# Patient Record
Sex: Female | Born: 1935 | Hispanic: No | Smoking: Former smoker
Health system: Southern US, Community
[De-identification: ages and names within clinical notes are randomized; demographics above are authoritative.]

## PROBLEM LIST (undated history)

## (undated) DIAGNOSIS — Z9889 Other specified postprocedural states: Secondary | ICD-10-CM

## (undated) DIAGNOSIS — J449 Chronic obstructive pulmonary disease, unspecified: Secondary | ICD-10-CM

## (undated) DIAGNOSIS — G709 Myoneural disorder, unspecified: Secondary | ICD-10-CM

## (undated) DIAGNOSIS — R112 Nausea with vomiting, unspecified: Secondary | ICD-10-CM

## (undated) DIAGNOSIS — M1990 Unspecified osteoarthritis, unspecified site: Secondary | ICD-10-CM

## (undated) DIAGNOSIS — R2681 Unsteadiness on feet: Secondary | ICD-10-CM

## (undated) DIAGNOSIS — J189 Pneumonia, unspecified organism: Secondary | ICD-10-CM

## (undated) DIAGNOSIS — I499 Cardiac arrhythmia, unspecified: Secondary | ICD-10-CM

## (undated) DIAGNOSIS — E162 Hypoglycemia, unspecified: Secondary | ICD-10-CM

## (undated) DIAGNOSIS — I4891 Unspecified atrial fibrillation: Principal | ICD-10-CM

## (undated) DIAGNOSIS — I509 Heart failure, unspecified: Secondary | ICD-10-CM

## (undated) DIAGNOSIS — G629 Polyneuropathy, unspecified: Secondary | ICD-10-CM

## (undated) DIAGNOSIS — I493 Ventricular premature depolarization: Secondary | ICD-10-CM

## (undated) HISTORY — PX: OTHER SURGICAL HISTORY: SHX169

## (undated) HISTORY — DX: Unspecified osteoarthritis, unspecified site: M19.90

## (undated) HISTORY — DX: Ventricular premature depolarization: I49.3

## (undated) HISTORY — PX: CHOLECYSTECTOMY: SHX55

## (undated) HISTORY — PX: APPENDECTOMY: SHX54

## (undated) HISTORY — PX: TONSILLECTOMY: SUR1361

---

## 2011-04-14 ENCOUNTER — Ambulatory Visit: Payer: Medicare Other

## 2011-04-14 ENCOUNTER — Ambulatory Visit (INDEPENDENT_AMBULATORY_CARE_PROVIDER_SITE_OTHER): Payer: Medicare Other | Admitting: Family Medicine

## 2011-04-14 VITALS — BP 147/80 | HR 41 | Temp 97.5°F | Resp 16 | Ht 62.5 in | Wt 189.0 lb

## 2011-04-14 DIAGNOSIS — R5381 Other malaise: Secondary | ICD-10-CM | POA: Diagnosis not present

## 2011-04-14 DIAGNOSIS — M542 Cervicalgia: Secondary | ICD-10-CM

## 2011-04-14 DIAGNOSIS — R5383 Other fatigue: Secondary | ICD-10-CM | POA: Diagnosis not present

## 2011-04-14 DIAGNOSIS — R42 Dizziness and giddiness: Secondary | ICD-10-CM | POA: Diagnosis not present

## 2011-04-14 DIAGNOSIS — R531 Weakness: Secondary | ICD-10-CM

## 2011-04-14 LAB — POCT CBC
Granulocyte percent: 56.7 %G (ref 37–80)
HCT, POC: 40.7 % (ref 37.7–47.9)
Hemoglobin: 13.4 g/dL (ref 12.2–16.2)
MCHC: 32.9 g/dL (ref 31.8–35.4)
MPV: 9.2 fL (ref 0–99.8)
POC Granulocyte: 5.9 (ref 2–6.9)
POC MID %: 7.4 %M (ref 0–12)

## 2011-04-14 LAB — COMPREHENSIVE METABOLIC PANEL
ALT: 18 U/L (ref 0–35)
AST: 24 U/L (ref 0–37)
CO2: 30 mEq/L (ref 19–32)
Calcium: 11.3 mg/dL — ABNORMAL HIGH (ref 8.4–10.5)
Chloride: 102 mEq/L (ref 96–112)
Creat: 1.15 mg/dL — ABNORMAL HIGH (ref 0.50–1.10)
Sodium: 142 mEq/L (ref 135–145)
Total Bilirubin: 0.4 mg/dL (ref 0.3–1.2)
Total Protein: 6.8 g/dL (ref 6.0–8.3)

## 2011-04-14 LAB — TSH: TSH: 2.309 u[IU]/mL (ref 0.350–4.500)

## 2011-04-14 LAB — GLUCOSE, POCT (MANUAL RESULT ENTRY): POC Glucose: 66

## 2011-04-14 NOTE — Progress Notes (Signed)
Patient Name: Crystal Kerr Date of Birth: 1924/06/14 Medical Record Number: [id number] Gender: female Date of Encounter: 04/14/2011  History of Present Illness:  Crystal Kerr is a 78 y.o. very pleasant female patient who presents with the following:  Here today with "dizziness," feeling lightheaded for the last month or so.  She had one episode of vertigo but mostly she is lightheaded.  Especially with poition change or bending over- she wonders if she is having hypotension.  She also notes a stiff neck especially at night- also notes that her neck is cracking and popping- this has gone on for 6- 9 months or even longer.  She notes no other symptoms to indicate illness such as fever, chills or malaise.    She is generally quite healthy and takes medication only for her asthma .  She has had hypotension in the past.   Low blood pressure seems to run in the family.    There is no problem list on file for this patient.  No past medical history on file. No past surgical history on file. History  Substance Use Topics  . Smoking status: Former Smoker    Quit date: 04/13/1981  . Smokeless tobacco: Not on file  . Alcohol Use: Not on file   No family history on file. No Known Allergies  Medication list has been reviewed and updated.  Review of Systems: As per HPI- otherwise negative.  Physical Examination: Filed Vitals:   04/14/11 0759  BP: 137/75  Pulse: 85  Temp: 97.5 F (36.4 C)  TempSrc: Oral  Resp: 16  Height: 5' 2.5" (1.588 m)  Weight: 189 lb (85.73 kg)   See orthostatics.  I believe pulse rate of 41 was likely due to multiple PVC.  Otherwise ok Body mass index is 34.02 kg/(m^2).  GEN: WDWN, NAD, Non-toxic, A & O x 3, overweight HEENT: Atraumatic, Normocephalic. Neck supple. No masses, No LAD.  TM, oropharynx wnl. PEERL Cervical spine: some decreased ROM especially to the right- normal flexion and extension, no bony TTP Ears and Nose: No external  deformity. CV: No M/G/R. No JVD. No thrill. No extra heart sounds. Irregular rate suggestive of multiple PVC PULM: CTA B, no wheezes, crackles, rhonchi. No retractions. No resp. distress. No accessory muscle use. ABD: S, NT, ND, +BS. No rebound. No HSM. EXTR: No c/c/e NEURO Normal gait.  Normal strength, sensation and DTR all extremities PSYCH: Normally interactive. Conversant. Not depressed or anxious appearing.  Calm demeanor.   Results for orders placed in visit on 04/14/11  POCT CBC      Component Value Range   WBC 10.4 (*) 4.6 - 10.2 (K/uL)   Lymph, poc 3.7 (*) 0.6 - 3.4    POC LYMPH PERCENT 35.9  10 - 50 (%L)   MID (cbc) 0.8  0 - 0.9    POC MID % 7.4  0 - 12 (%M)   POC Granulocyte 5.9  2 - 6.9    Granulocyte percent 56.7  37 - 80 (%G)   RBC 4.67  4.04 - 5.48 (M/uL)   Hemoglobin 13.4  12.2 - 16.2 (g/dL)   HCT, POC 95.2  84.1 - 47.9 (%)   MCV 87.2  80 - 97 (fL)   MCH, POC 28.7  27 - 31.2 (pg)   MCHC 32.9  31.8 - 35.4 (g/dL)   RDW, POC 32.4     Platelet Count, POC 251  142 - 424 (K/uL)   MPV 9.2  0 - 99.8 (  fL)  GLUCOSE, POCT (MANUAL RESULT ENTRY)      Component Value Range   POC Glucose 66     She did eat a pop tart this am at around 6:30.  Gave juice to eat- her daughter is driving her.  Explained that her sugar is a little low  [facility] reading (PRIMARY) by  Dr. Patsy Lager.  Cervical spine with advanced degenerative changes but nothing acute.   CERVICAL SPINE - COMPLETE 4+ VIEW  Comparison: None.  Findings: Alignment is grossly anatomic through the cervical thoracic junction on the swimmer's view. Bone detail is limited by soft tissue and osteopenia. Marked spondylosis and uncovertebral disease is present across multiple levels, particularly at C4-5, C5- 6, and C6-7. The lung apices are clear.  IMPRESSION:  1. Multilevel spondylosis. 2. No acute abnormality. 3. No change from the preliminary report.  EKG: sinus rhythm with frequent PVC, sometimes in trigeminy.  No ST  elevation or depression.  Compared with older EKG and did not note significant change except move PVCs  Assessment and Plan: 1. Neck pain  DG Cervical Spine Complete  2. Dizziness  POCT CBC, POCT glucose (manual entry), Comprehensive metabolic panel, EKG 12-Lead  3. Weakness  TSH   Neck pain seems due to OA.  Start with tylenol prn- if not controlling pain let me know.    Weakness and dizziness: await further labs as above.  Hypoglycemia may be contributing- be sure to eat regularly.  Will also refer to cardiology for evaluation- did explain to patient that her multiple PVCs are likely benign .

## 2011-04-15 ENCOUNTER — Encounter: Payer: Self-pay | Admitting: Family Medicine

## 2011-04-15 ENCOUNTER — Ambulatory Visit: Payer: Self-pay | Admitting: Family Medicine

## 2011-04-18 ENCOUNTER — Telehealth: Payer: Self-pay | Admitting: Radiology

## 2011-04-18 NOTE — Telephone Encounter (Signed)
Per Dr Patsy Lager called pt and she will recheck this week for labs.

## 2011-04-22 ENCOUNTER — Encounter: Payer: Self-pay | Admitting: Family Medicine

## 2011-04-22 ENCOUNTER — Other Ambulatory Visit: Payer: Self-pay | Admitting: Family Medicine

## 2011-04-22 ENCOUNTER — Encounter: Payer: Self-pay | Admitting: Cardiovascular Disease

## 2011-04-22 ENCOUNTER — Ambulatory Visit (INDEPENDENT_AMBULATORY_CARE_PROVIDER_SITE_OTHER): Payer: Medicare Other | Admitting: Cardiovascular Disease

## 2011-04-22 ENCOUNTER — Other Ambulatory Visit (INDEPENDENT_AMBULATORY_CARE_PROVIDER_SITE_OTHER): Payer: Medicare Other | Admitting: Family Medicine

## 2011-04-22 VITALS — BP 136/70 | HR 80 | Ht 63.0 in | Wt 190.0 lb

## 2011-04-22 DIAGNOSIS — R011 Cardiac murmur, unspecified: Secondary | ICD-10-CM | POA: Diagnosis not present

## 2011-04-22 DIAGNOSIS — R5381 Other malaise: Secondary | ICD-10-CM | POA: Diagnosis not present

## 2011-04-22 DIAGNOSIS — I4949 Other premature depolarization: Secondary | ICD-10-CM

## 2011-04-22 DIAGNOSIS — R42 Dizziness and giddiness: Secondary | ICD-10-CM

## 2011-04-22 DIAGNOSIS — E875 Hyperkalemia: Secondary | ICD-10-CM

## 2011-04-22 DIAGNOSIS — I493 Ventricular premature depolarization: Secondary | ICD-10-CM

## 2011-04-22 DIAGNOSIS — R7989 Other specified abnormal findings of blood chemistry: Secondary | ICD-10-CM | POA: Diagnosis not present

## 2011-04-22 NOTE — Progress Notes (Signed)
Here today for labs.  She has follow- up with cardiology today as well.  We will check her PTH level as she has an elevated calcium, and also recheck BMP and FLP

## 2011-04-22 NOTE — Patient Instructions (Addendum)
Your physician has recommended that you wear a holter monitor. Holter monitors are medical devices that record the heart's electrical activity. Doctors most often use these monitors to diagnose arrhythmias. Arrhythmias are problems with the speed or rhythm of the heartbeat. The monitor is a small, portable device. You can wear one while you do your normal daily activities. This is usually used to diagnose what is causing palpitations/syncope (passing out).  Your physician has requested that you have an echocardiogram. Echocardiography is a painless test that uses sound waves to create images of your heart. It provides your doctor with information about the size and shape of your heart and how well your heart's chambers and valves are working. This procedure takes approximately one hour. There are no restrictions for this procedure.  We will call you with th results of your tests and will arrange follow up if needed

## 2011-04-23 LAB — PTH, INTACT AND CALCIUM
Calcium, Total (PTH): 9.3 mg/dL (ref 8.4–10.5)
PTH: 30.2 pg/mL (ref 14.0–72.0)

## 2011-04-23 LAB — BASIC METABOLIC PANEL WITH GFR
BUN: 21 mg/dL (ref 6–23)
CO2: 27 mEq/L (ref 19–32)
Chloride: 104 mEq/L (ref 96–112)
Creat: 1.05 mg/dL (ref 0.50–1.10)

## 2011-04-23 LAB — LIPID PANEL
Total CHOL/HDL Ratio: 4.6 Ratio
VLDL: 31 mg/dL (ref 0–40)

## 2011-04-24 ENCOUNTER — Encounter: Payer: Self-pay | Admitting: Cardiovascular Disease

## 2011-04-24 DIAGNOSIS — I493 Ventricular premature depolarization: Secondary | ICD-10-CM | POA: Insufficient documentation

## 2011-04-24 DIAGNOSIS — R011 Cardiac murmur, unspecified: Secondary | ICD-10-CM | POA: Insufficient documentation

## 2011-04-24 NOTE — Assessment & Plan Note (Signed)
Patient does have a faint murmur suggestive likely of aortic sclerosis. Given her symptoms of dizziness though, I will request an echocardiogram.

## 2011-04-24 NOTE — Assessment & Plan Note (Addendum)
The patient has frequent PVCs documented by her physical exam and on  her EKG. She does not seem to have palpitations but it is possible that these PVCs might be contributing to some of her dizziness. I think it's important to rule out other underlying arrhythmia. Thus, I will request a 48-hour Holter monitor. Some of her symptoms might be orthostatic in nature although he recently performed orthostatic vital signs were unremarkable. Her dizziness is overall mild. She seems to be anxious about the possibility of falling and thus it might be helpful to use a cane or a walker if needed. I advised her to avoid sudden change in position or standing up. If the PVCs on the Holter monitor were found to be excessive, I might consider treatment with a calcium channel blocker and not a beta blocker due to history of asthma. Overall, I agree that these PVCs are most likely benign though. The patient has no convincing symptoms of angina and thus I would not pursue ischemic cardiac evaluation.

## 2011-04-24 NOTE — Progress Notes (Signed)
HPI  This is an 78 year old female who is referred for evaluation of dizziness and PVCs. This is really a healthy and functional female. Other than asthma, she has no chronic medical conditions. She is not aware of any previous cardiac history. She has no history of hypertension or diabetes. She recently started having problems with mild dizziness and occasionally vertigo. The dizziness happens if she stands up suddenly or changes position. She also seems to be anxious about her balance. She is afraid to fall. She denies any syncope or presyncope. She has mild dyspnea but no chest pain. She was found to have frequent PVCs and her ECG. She had routine labs performed including thyroid function. Overall there were unremarkable. The dizziness overall seems to be mild. She does not actually have palpitations.  No Known Allergies   Current Outpatient Prescriptions on File Prior to Visit  Medication Sig Dispense Refill  . fish oil-omega-3 fatty acids 1000 MG capsule Take 2 g by mouth daily.      . Fluticasone-Salmeterol (ADVAIR) 100-50 MCG/DOSE AEPB Inhale 1 puff into the lungs every 12 (twelve) hours.      . montelukast (SINGULAIR) 10 MG tablet Take 10 mg by mouth at bedtime.      . Multiple Vitamin (MULTIVITAMIN) tablet Take 1 tablet by mouth daily.         Past Medical History  Diagnosis Date  . Asthma      History reviewed. No pertinent past surgical history.   History reviewed. No pertinent family history.   History   Social History  . Marital Status: Unknown    Spouse Name: N/A    Number of Children: N/A  . Years of Education: N/A   Occupational History  . Not on file.   Social History Main Topics  . Smoking status: Former Smoker    Quit date: 04/13/1981  . Smokeless tobacco: Not on file  . Alcohol Use: Not on file  . Drug Use: Not on file  . Sexually Active: Not on file   Other Topics Concern  . Not on file   Social History Narrative  . No narrative on file      ROS Constitutional: Negative for fever, chills, diaphoresis, activity change, appetite change and fatigue.  HENT: Negative for hearing loss, nosebleeds, congestion, sore throat, facial swelling, drooling, trouble swallowing, neck pain, voice change, sinus pressure and tinnitus.  Eyes: Negative for photophobia, pain, discharge and visual disturbance.  Respiratory: Negative for apnea, cough, chest tightnes.  Cardiovascular: Negative for chest pain, palpitations and leg swelling.  Gastrointestinal: Negative for nausea, vomiting, abdominal pain, diarrhea, constipation, blood in stool and abdominal distention.  Genitourinary: Negative for dysuria, urgency, frequency, hematuria and decreased urine volume.  Musculoskeletal: Negative for myalgias, back pain, joint swelling, arthralgias and gait problem.  Skin: Negative for color change, pallor, rash and wound.  Neurological: Negative for dizziness, tremors, seizures, syncope, speech difficulty, weakness,numbness and headaches.  Psychiatric/Behavioral: Negative for suicidal ideas, hallucinations, behavioral problems and agitation. The patient is not nervous/anxious.     PHYSICAL EXAM   BP 136/70  Pulse 80  Ht 5\' 3"  (1.6 m)  Wt 190 lb (86.183 kg)  BMI 33.66 kg/m2  Constitutional: She is oriented to person, place, and time. She appears well-developed and well-nourished. No distress.  HENT: No nasal discharge.  Head: Normocephalic and atraumatic.  Eyes: Pupils are equal and round. Right eye exhibits no discharge. Left eye exhibits no discharge.  Neck: Normal range of motion. Neck supple.  No JVD present. No thyromegaly present.  Cardiovascular: Normal rate, regular rhythm, normal heart sounds. Exam reveals no gallop and no friction rub. There is a 1/6 systolic ejection murmur at the aortic area with preserved S2. Pulmonary/Chest: Effort normal and breath sounds normal. No stridor. No respiratory distress. She has no wheezes. She has no  rales. She exhibits no tenderness.  Abdominal: Soft. Bowel sounds are normal. She exhibits no distension. There is no tenderness. There is no rebound and no guarding.  Musculoskeletal: Normal range of motion. She exhibits no edema and no tenderness.  Neurological: She is alert and oriented to person, place, and time. Coordination normal.  Skin: Skin is warm and dry. No rash noted. She is not diaphoretic. No erythema. No pallor.  Psychiatric: She has a normal mood and affect. Her behavior is normal. Judgment and thought content normal.    EKG: Normal sinus rhythm with frequent monomorphic PVCs   ASSESSMENT AND PLAN

## 2011-05-05 ENCOUNTER — Encounter (INDEPENDENT_AMBULATORY_CARE_PROVIDER_SITE_OTHER): Payer: Medicare Other

## 2011-05-05 ENCOUNTER — Ambulatory Visit (HOSPITAL_COMMUNITY): Payer: Medicare Other | Attending: Cardiology

## 2011-05-05 ENCOUNTER — Other Ambulatory Visit: Payer: Self-pay

## 2011-05-05 DIAGNOSIS — E669 Obesity, unspecified: Secondary | ICD-10-CM | POA: Diagnosis not present

## 2011-05-05 DIAGNOSIS — Z87891 Personal history of nicotine dependence: Secondary | ICD-10-CM | POA: Insufficient documentation

## 2011-05-05 DIAGNOSIS — I519 Heart disease, unspecified: Secondary | ICD-10-CM | POA: Insufficient documentation

## 2011-05-05 DIAGNOSIS — I493 Ventricular premature depolarization: Secondary | ICD-10-CM

## 2011-05-05 DIAGNOSIS — R011 Cardiac murmur, unspecified: Secondary | ICD-10-CM | POA: Insufficient documentation

## 2011-05-05 DIAGNOSIS — I517 Cardiomegaly: Secondary | ICD-10-CM | POA: Insufficient documentation

## 2011-05-05 DIAGNOSIS — R42 Dizziness and giddiness: Secondary | ICD-10-CM

## 2011-05-14 ENCOUNTER — Other Ambulatory Visit: Payer: Self-pay | Admitting: *Deleted

## 2011-05-14 DIAGNOSIS — R002 Palpitations: Secondary | ICD-10-CM

## 2011-05-14 MED ORDER — DILTIAZEM HCL ER BEADS 120 MG PO CP24: 120.0000 mg | ORAL_CAPSULE | Freq: Every day | ORAL | Status: DC

## 2011-05-15 ENCOUNTER — Other Ambulatory Visit: Payer: Self-pay | Admitting: Internal Medicine

## 2011-05-20 ENCOUNTER — Telehealth: Payer: Self-pay

## 2011-05-20 NOTE — Telephone Encounter (Signed)
Spoke with daughter. She would like to get her moms lab results. Can someone review and let me know what to tell her. Thanks

## 2011-05-22 NOTE — Telephone Encounter (Signed)
Dr. Patsy Lager sent the patient a letter 04/15/2011 with copies of the lab results.  It's ok to review the letter with the daughter.

## 2011-05-23 NOTE — Telephone Encounter (Signed)
Left message on daughter's machine for her to call back

## 2011-05-23 NOTE — Telephone Encounter (Signed)
Spoke with daughter. I told her the lab results and she will bring pt in monday to see Dr Patsy Lager

## 2011-05-26 ENCOUNTER — Ambulatory Visit (INDEPENDENT_AMBULATORY_CARE_PROVIDER_SITE_OTHER): Payer: Medicare Other | Admitting: Family Medicine

## 2011-05-26 VITALS — BP 143/74 | HR 83 | Temp 98.0°F | Resp 16 | Ht 62.5 in | Wt 189.0 lb

## 2011-05-26 DIAGNOSIS — R300 Dysuria: Secondary | ICD-10-CM

## 2011-05-26 DIAGNOSIS — N905 Atrophy of vulva: Secondary | ICD-10-CM

## 2011-05-26 DIAGNOSIS — N390 Urinary tract infection, site not specified: Secondary | ICD-10-CM

## 2011-05-26 LAB — POCT URINALYSIS DIPSTICK
Nitrite, UA: NEGATIVE
Spec Grav, UA: 1.01
Urobilinogen, UA: 0.2

## 2011-05-26 LAB — POCT UA - MICROSCOPIC ONLY
Casts, Ur, LPF, POC: NEGATIVE
Crystals, Ur, HPF, POC: NEGATIVE
Yeast, UA: NEGATIVE

## 2011-05-26 MED ORDER — CEPHALEXIN 500 MG PO CAPS: 500.0000 mg | ORAL_CAPSULE | Freq: Two times a day (BID) | ORAL | Status: AC

## 2011-05-26 MED ORDER — ESTROGENS, CONJUGATED 0.625 MG/GM VA CREA: TOPICAL_CREAM | VAGINAL | Status: DC

## 2011-05-26 NOTE — Progress Notes (Signed)
Patient Name: Crystal Kerr Date of Birth: July 23, 1924 Medical Record Number: [id number] Gender: female Date of Encounter: 05/26/2011  History of Present Illness:  Crystal Kerr is a 78 y.o. very pleasant female patient who presents with the following:  Notes itching and burning with urination for months but worse for one day. She feels that the problem is actually on her labia/ skin as opposed to a UTI.  She has tried different soaps/ clothes detergents but this is not helpful. Her dizziness is lot better- her BP medication was changed to control her PVSc and this seems to be helping her.  She saw Dr. Kirke Corin at [facility] cardiology. Her holter monitor justs showed her PVCs per her report  She has not had a hysterectomy.  No history of uterine, ovarian or breast cancer  Patient Active Problem List  Diagnoses  . PVC's (premature ventricular contractions)  . Murmur, cardiac   Past Medical History  Diagnosis Date  . Asthma    No past surgical history on file. History  Substance Use Topics  . Smoking status: Former Smoker    Quit date: 04/13/1981  . Smokeless tobacco: Not on file  . Alcohol Use: Not on file   No family history on file. No Known Allergies  Medication list has been reviewed and updated.  Review of Systems: As per HPI- otherwise negative. Also has some wheezing but this is very typical for her this time of year.  She used adviar and singulair as needed.    Physical Examination: Filed Vitals:   05/26/11 0801  BP: 143/74  Pulse: 83  Temp: 98 F (36.7 C)  TempSrc: Oral  Resp: 16  Height: 5' 2.5" (1.588 m)  Weight: 189 lb (85.73 kg)    Body mass index is 34.02 kg/(m^2).  GEN: WDWN, NAD, Non-toxic, A & O x 3, obese HEENT: Atraumatic, Normocephalic. Neck supple. No masses, No LAD. Ears and Nose: No external deformity. CV: RRR, No M/G/R. No JVD. No thrill. No extra heart sounds. PULM: CTA B, crackles, rhonchi. No retractions. No resp.  distress. No accessory muscle use.  Mild bilateral wheezes ABD: S, NT, ND, +BS. No rebound. No HSM. EXTR: No c/c/e NEURO Normal gait.  PSYCH: Normally interactive. Conversant. Not depressed or anxious appearing.  Calm demeanor. GU: external tissues appear a little bit irritated and atrophic - she admits to scratching quite a bit.   Results for orders placed in visit on 05/26/11  POCT UA - MICROSCOPIC ONLY      Component Value Range   WBC, Ur, HPF, POC 18-22     RBC, urine, microscopic 12-15     Bacteria, U Microscopic 1+     Mucus, UA neg     Epithelial cells, urine per micros 1-4     Crystals, Ur, HPF, POC neg     Casts, Ur, LPF, POC neg     Yeast, UA neg    POCT URINALYSIS DIPSTICK      Component Value Range   Color, UA yellow     Clarity, UA sl.cloudy     Glucose, UA neg     Bilirubin, UA neg     Ketones, UA neg     Spec Grav, UA 1.010     Blood, UA large     pH, UA 6.5     Protein, UA trace     Urobilinogen, UA 0.2     Nitrite, UA neg     Leukocytes, UA large (3+)  Assessment and Plan: 1. Dysuria  POCT UA - Microscopic Only, POCT urinalysis dipstick  2. UTI (lower urinary tract infection)  cephALEXin (KEFLEX) 500 MG capsule, Urine culture  3. Vulvar atrophy  conjugated estrogens (PREMARIN) vaginal cream   Suspect that Rola does actually have a UTI- await Ucx to know for sure but will treat with keflex in the meantime.  Also suspect that some of her itching and irritation is due to atrophy.  Discussed risks and benefits of a topical estrogen cream- she would like to try it at least for a short while.  Prescribed premarin as above- may use daily for a week or so, then taper to a few times a week as needed for maintenance.    For some reason she did not hear about her most recent lab results- I apologized for this oversight and went over labs today.  Her calcium came back to normal, and her PTH is normal so nothing further needed.  Cholesterol ratio is certainly  acceptable- she does not need to start medication unless she desires to do so.    Orders Only on 04/22/2011  Component Date Value Range Status  . Sodium (mEq/L) 04/22/2011 140  135-145 Final  . Potassium (mEq/L) 04/22/2011 4.5  3.5-5.3 Final  . Chloride (mEq/L) 04/22/2011 104  96-112 Final  . CO2 (mEq/L) 04/22/2011 27  19-32 Final  . Glucose, Bld (mg/dL) 16/10/9602 94  54-09 Final  . BUN (mg/dL) 81/19/1478 21  2-95 Final  . Creat (mg/dL) 62/13/0865 7.84  6.96-2.95 Final  . Calcium (mg/dL) 28/41/3244 9.3  0.1-02.7 Final  . GFR, Est African American (mL/min) 04/22/2011 56*  Final  . GFR, Est Non African American (mL/min) 04/22/2011 49*  Final   Comment:                            The estimated GFR is a calculation valid for adults (18 to 76 years                          old) that uses the CKD-EPI algorithm to adjust for age and sex. It                          is not to be used for children, pregnant women, hospitalized patients,                          patients on dialysis, or with rapidly changing kidney function.                          According to the NKDEP, eGFR >89 is normal, 60-89 shows mild                          impairment, 30-59 shows moderate impairment, 15-29 shows severe                          impairment and <15 is ESRD.  Marland Kitchen Cholesterol (mg/dL) 25/36/6440 347* 4-259 Final   Comment: ATP III Classification:                                < 200        mg/dL  Desirable                               200 - 239     mg/dL        Borderline High                               >= 240        mg/dL        High                             . Triglycerides (mg/dL) 16/10/9602 540* <981 Final  . HDL (mg/dL) 19/14/7829 50  >56 Final  . Total CHOL/HDL Ratio (Ratio) 04/22/2011 4.6   Final  . VLDL (mg/dL) 21/30/8657 31  8-46 Final  . LDL Cholesterol (mg/dL) 96/29/5284 132* 4-40 Final   Comment:                            Total Cholesterol/HDL Ratio:CHD Risk                                                  Coronary Heart Disease Risk Table                                                                 Men       Women                                   1/2 Average Risk              3.4        3.3                                       Average Risk              5.0        4.4                                    2X Average Risk              9.6        7.1                                    3X Average Risk             23.4       11.0                          Use the calculated Patient Ratio above and  the CHD Risk table                           to determine the patient's CHD Risk.                          ATP III Classification (LDL):                                < 100        mg/dL         Optimal                               100 - 129     mg/dL         Near or Above Optimal                               130 - 159     mg/dL         Borderline High                               160 - 189     mg/dL         High                                > 190        mg/dL         Very High                             . PTH (pg/mL) 04/22/2011 30.2  14.0-72.0 Final  . Calcium, Total (PTH) (mg/dL) 16/10/9602 9.3  5.4-09.8 Final

## 2011-05-28 LAB — URINE CULTURE: Colony Count: 6000

## 2011-06-02 ENCOUNTER — Other Ambulatory Visit: Payer: Self-pay | Admitting: Family Medicine

## 2011-06-02 DIAGNOSIS — N390 Urinary tract infection, site not specified: Secondary | ICD-10-CM

## 2011-06-02 DIAGNOSIS — R002 Palpitations: Secondary | ICD-10-CM

## 2011-06-02 DIAGNOSIS — R899 Unspecified abnormal finding in specimens from other organs, systems and tissues: Secondary | ICD-10-CM

## 2011-06-02 MED ORDER — DILTIAZEM HCL ER BEADS 120 MG PO CP24: 120.0000 mg | ORAL_CAPSULE | Freq: Every day | ORAL | Status: DC

## 2011-06-02 MED ORDER — MONTELUKAST SODIUM 10 MG PO TABS: ORAL_TABLET | ORAL | Status: DC

## 2011-06-02 NOTE — Progress Notes (Signed)
Called and discussed with her- her urine culture showed mixed bacteria, just a few thousand CFU.  She does feel better, but her urinary symptoms are not totally resolved.  She will come by today or tomorrow (lab visit only) and drop off a repeat urine for analysis and culture.  Also discussed her slightly low GFR.  I do not have any old values for her on her chart. We will also recheck this for her when she comes for her lab visit. I will also refill her diltiazem and singulair for her.

## 2011-06-03 ENCOUNTER — Other Ambulatory Visit (INDEPENDENT_AMBULATORY_CARE_PROVIDER_SITE_OTHER): Payer: Medicare Other | Admitting: Family Medicine

## 2011-06-03 ENCOUNTER — Other Ambulatory Visit: Payer: Self-pay | Admitting: Family Medicine

## 2011-06-03 DIAGNOSIS — R6889 Other general symptoms and signs: Secondary | ICD-10-CM | POA: Diagnosis not present

## 2011-06-03 DIAGNOSIS — R899 Unspecified abnormal finding in specimens from other organs, systems and tissues: Secondary | ICD-10-CM

## 2011-06-03 DIAGNOSIS — N390 Urinary tract infection, site not specified: Secondary | ICD-10-CM | POA: Diagnosis not present

## 2011-06-03 LAB — POCT UA - MICROSCOPIC ONLY
Casts, Ur, LPF, POC: NEGATIVE
Mucus, UA: POSITIVE
Yeast, UA: NEGATIVE

## 2011-06-03 LAB — POCT URINALYSIS DIPSTICK
Ketones, UA: NEGATIVE
Protein, UA: NEGATIVE
Spec Grav, UA: 1.01
pH, UA: 6.5

## 2011-06-03 LAB — BASIC METABOLIC PANEL
BUN: 21 mg/dL (ref 6–23)
Calcium: 9.5 mg/dL (ref 8.4–10.5)
Glucose, Bld: 79 mg/dL (ref 70–99)

## 2011-06-03 NOTE — Progress Notes (Signed)
Here today for a lab visit only  Results for orders placed in visit on 06/03/11  POCT URINALYSIS DIPSTICK      Component Value Range   Color, UA yellow     Clarity, UA clear     Glucose, UA neg     Bilirubin, UA neg     Ketones, UA neg     Spec Grav, UA 1.010     Blood, UA trace-lysed     pH, UA 6.5     Protein, UA neg     Urobilinogen, UA 0.2     Nitrite, UA neg     Leukocytes, UA large (3+)    POCT UA - MICROSCOPIC ONLY      Component Value Range   WBC, Ur, HPF, POC 8-15     RBC, urine, microscopic 0-2     Bacteria, U Microscopic 1+     Mucus, UA positive     Epithelial cells, urine per micros 3-5     Crystals, Ur, HPF, POC neg     Casts, Ur, LPF, POC neg     Yeast, UA neg    BASIC METABOLIC PANEL      Component Value Range   Sodium 140  135 - 145 (mEq/L)   Potassium 4.5  3.5 - 5.3 (mEq/L)   Chloride 103  96 - 112 (mEq/L)   CO2 31  19 - 32 (mEq/L)   Glucose, Bld 79  70 - 99 (mg/dL)   BUN 21  6 - 23 (mg/dL)   Creat 3.08 (*) 6.57 - 1.10 (mg/dL)   Calcium 9.5  8.4 - 84.6 (mg/dL)

## 2011-06-09 ENCOUNTER — Other Ambulatory Visit: Payer: Self-pay | Admitting: Family Medicine

## 2011-06-09 DIAGNOSIS — R3129 Other microscopic hematuria: Secondary | ICD-10-CM

## 2011-06-09 NOTE — Progress Notes (Signed)
Called to go over recent labs.  Printed out labs for ease of review.   Crystal Kerr has a slightly elevated creatinine, and a GFR of about 50.  She does not know if this may be new or old- she has not had any labs done in several years.  Her creatinine is hovering around or below 1.15-  04/14/11 1.15 04/22/11  1.05 06/03/11 1.09, and GFR of 51.55  We have also been following her urine- on 5/14 her UA showed 18- 22 WBC, 12-15 RBC, and her Ucx showed 6,000 colonies Repeated on 06/03/11- WBC 8- 15, RBC 0-2, and ucx negative.    Discussed trace hematuria with her in detail- she had been a smoker, one pack per day for 15- 20 years in the past.  She quit about 30 years ago.  However, this does give her an increased risk of bladder cancer which can present with microhematuria. Offered to refer her to urology for further evaluation.    However, given her age Nawaal does not want to pursue a urology consult/ cystoscopy unless unless there is absolutely no alternative.  She wound like to do one more urine recheck in about one month- if this is still positive she will consider seeing urology.  I will leave this order on her chart.    We plan to check her kidney function every 6 months- she will see me in the fall to follow this up.

## 2011-07-02 ENCOUNTER — Telehealth: Payer: Self-pay

## 2011-07-02 DIAGNOSIS — H9190 Unspecified hearing loss, unspecified ear: Secondary | ICD-10-CM

## 2011-07-02 NOTE — Telephone Encounter (Signed)
Pt's daughter is calling asking that we fax a referral to [facility] for her mother to be seen for a hearing test the fax number is [phone] and they will call beverly to schedule appt  Best number [phone]

## 2011-07-02 NOTE — Telephone Encounter (Signed)
Pt's daughter is calling needing a referral to [facility] for her mother please fax the referral to [phone] and they will call Meriam Sprague to schedule the mothers appt  Best number [phone]

## 2011-07-02 NOTE — Telephone Encounter (Signed)
Referral done

## 2011-07-03 NOTE — Telephone Encounter (Signed)
Spoke with patient and let her know that referral was sent in.

## 2011-07-21 ENCOUNTER — Telehealth: Payer: Self-pay | Admitting: Physician Assistant

## 2011-07-21 DIAGNOSIS — H903 Sensorineural hearing loss, bilateral: Secondary | ICD-10-CM | POA: Diagnosis not present

## 2011-07-21 DIAGNOSIS — H9190 Unspecified hearing loss, unspecified ear: Secondary | ICD-10-CM

## 2011-07-21 NOTE — Telephone Encounter (Signed)
Patient notified

## 2011-07-21 NOTE — Addendum Note (Signed)
Addended by: Sondra Barges on: 07/21/2011 04:08 PM   Modules accepted: Orders

## 2011-07-21 NOTE — Telephone Encounter (Signed)
Gave her message--she states she needs "prescription" for the audiologist to do the exam in the future so that her insurance will pay for it.

## 2011-07-21 NOTE — Telephone Encounter (Signed)
Please call the patient and advise them to follow up with a repeat audiology exam in 12-24 months as advised by the audiologist, secondary to a slight decline in hearing sensitivity. Please see scanned in sheet.

## 2011-07-21 NOTE — Telephone Encounter (Signed)
Referral done

## 2011-07-24 ENCOUNTER — Other Ambulatory Visit: Payer: Self-pay | Admitting: Internal Medicine

## 2011-08-05 ENCOUNTER — Encounter: Payer: Self-pay | Admitting: Cardiovascular Disease

## 2011-08-05 ENCOUNTER — Ambulatory Visit (INDEPENDENT_AMBULATORY_CARE_PROVIDER_SITE_OTHER): Payer: Medicare Other | Admitting: Cardiovascular Disease

## 2011-08-05 VITALS — BP 122/78 | HR 68 | Ht 61.0 in | Wt 188.0 lb

## 2011-08-05 DIAGNOSIS — I4949 Other premature depolarization: Secondary | ICD-10-CM

## 2011-08-05 DIAGNOSIS — I493 Ventricular premature depolarization: Secondary | ICD-10-CM

## 2011-08-05 NOTE — Patient Instructions (Addendum)
Your physician wants you to follow-up in: 1 year. You will receive a reminder letter in the mail two months in advance. If you don't receive a letter, please call our office to schedule the follow-up appointment.  

## 2011-08-06 NOTE — Assessment & Plan Note (Signed)
The patient's dizziness was likely due to excessive PVCs and orthostatic hypotension. Her symptoms significantly improved with small dose diltiazem. I recommend continuing this medication. By physical exam today, she has only a few premature beats. Her LV systolic function was normal by echo. She reports no other cardiac symptoms. I recommend followup in one year or earlier if needed.

## 2011-08-06 NOTE — Progress Notes (Signed)
    HPI  This is an 78 year old female who is here today for a followup visit. She was seen by me for evaluation of dizziness and PVCs. She was noted to be orthostatic by physical exam. Echocardiogram showed normal LV systolic function without significant valvular abnormalities. Holter monitor confirmed excessive PVCs. 20% of her beats were actually PVCs. Due to that, I started her on diltiazem extended release 120 mg once daily. She had significant improvement with that. She reports that her dizziness has completely resolved.   No Known Allergies   Current Outpatient Prescriptions on File Prior to Visit  Medication Sig Dispense Refill  . ADVAIR DISKUS 250-50 MCG/DOSE AEPB INHALE 1 PUFF TWICE DAILY. RINSE MOUTH AFTER USE.  1 each  5  . diltiazem (TIAZAC) 120 MG 24 hr capsule Take 1 capsule (120 mg total) by mouth daily.  90 capsule  2  . fish oil-omega-3 fatty acids 1000 MG capsule Take 2 g by mouth daily.      . Fluticasone-Salmeterol (ADVAIR) 100-50 MCG/DOSE AEPB Inhale 1 puff into the lungs every 12 (twelve) hours.      . montelukast (SINGULAIR) 10 MG tablet Take one daily  90 tablet  3  . Multiple Vitamin (MULTIVITAMIN) tablet Take 1 tablet by mouth daily.         Past Medical History  Diagnosis Date  . Asthma      No past surgical history on file.   No family history on file.   History   Social History  . Marital Status: Unknown    Spouse Name: N/A    Number of Children: N/A  . Years of Education: N/A   Occupational History  . Not on file.   Social History Main Topics  . Smoking status: Former Smoker    Quit date: 04/13/1981  . Smokeless tobacco: Never Used  . Alcohol Use: Not on file  . Drug Use: Not on file  . Sexually Active: Not on file   Other Topics Concern  . Not on file   Social History Narrative  . No narrative on file      PHYSICAL EXAM   BP 122/78  Pulse 68  Ht 5\' 1"  (1.549 m)  Wt 188 lb (85.276 kg)  BMI 35.52 kg/m2  Constitutional:  She is oriented to person, place, and time. She appears well-developed and well-nourished. No distress.  HENT: No nasal discharge.  Head: Normocephalic and atraumatic.  Eyes: Pupils are equal and round. Right eye exhibits no discharge. Left eye exhibits no discharge.  Neck: Normal range of motion. Neck supple. No JVD present. No thyromegaly present.  Cardiovascular: Normal rate, regular rhythm, normal heart sounds. Exam reveals no gallop and no friction rub. No murmur heard.  Pulmonary/Chest: Effort normal and breath sounds normal. No stridor. No respiratory distress. She has no wheezes. She has no rales. She exhibits no tenderness.  Abdominal: Soft. Bowel sounds are normal. She exhibits no distension. There is no tenderness. There is no rebound and no guarding.  Musculoskeletal: Normal range of motion. She exhibits no edema and no tenderness.  Neurological: She is alert and oriented to person, place, and time. Coordination normal.  Skin: Skin is warm and dry. No rash noted. She is not diaphoretic. No erythema. No pallor.  Psychiatric: She has a normal mood and affect. Her behavior is normal. Judgment and thought content normal.      ASSESSMENT AND PLAN

## 2011-08-17 DIAGNOSIS — M2040 Other hammer toe(s) (acquired), unspecified foot: Secondary | ICD-10-CM | POA: Diagnosis not present

## 2011-08-17 DIAGNOSIS — B351 Tinea unguium: Secondary | ICD-10-CM | POA: Diagnosis not present

## 2011-10-11 ENCOUNTER — Ambulatory Visit (INDEPENDENT_AMBULATORY_CARE_PROVIDER_SITE_OTHER): Payer: Medicare Other | Admitting: Family Medicine

## 2011-10-11 VITALS — BP 122/86 | HR 76 | Temp 98.0°F | Resp 16 | Ht 62.0 in | Wt 188.4 lb

## 2011-10-11 DIAGNOSIS — H04209 Unspecified epiphora, unspecified lacrimal gland: Secondary | ICD-10-CM | POA: Diagnosis not present

## 2011-10-11 DIAGNOSIS — R3129 Other microscopic hematuria: Secondary | ICD-10-CM

## 2011-10-11 DIAGNOSIS — Z23 Encounter for immunization: Secondary | ICD-10-CM | POA: Diagnosis not present

## 2011-10-11 DIAGNOSIS — B029 Zoster without complications: Secondary | ICD-10-CM

## 2011-10-11 DIAGNOSIS — N952 Postmenopausal atrophic vaginitis: Secondary | ICD-10-CM

## 2011-10-11 DIAGNOSIS — R300 Dysuria: Secondary | ICD-10-CM

## 2011-10-11 LAB — POCT UA - MICROSCOPIC ONLY
Crystals, Ur, HPF, POC: NEGATIVE
Yeast, UA: NEGATIVE

## 2011-10-11 LAB — POCT URINALYSIS DIPSTICK
Nitrite, UA: NEGATIVE
Protein, UA: 100
pH, UA: 7

## 2011-10-11 MED ORDER — PREDNISONE 20 MG PO TABS: ORAL_TABLET | ORAL | Status: DC

## 2011-10-11 MED ORDER — VALACYCLOVIR HCL 1 G PO TABS: 1000.0000 mg | ORAL_TABLET | Freq: Three times a day (TID) | ORAL | Status: DC

## 2011-10-11 MED ORDER — GABAPENTIN 100 MG PO CAPS: ORAL_CAPSULE | ORAL | Status: DC

## 2011-10-11 NOTE — Patient Instructions (Addendum)
1. Burning with urination    2. Microhematuria  POCT UA - Microscopic Only, POCT urinalysis dipstick  3. Herpes zoster  valACYclovir (VALTREX) 1000 MG tablet, predniSONE (DELTASONE) 20 MG tablet, gabapentin (NEURONTIN) 100 MG capsule  4. Eye tearing    5. Need for prophylactic vaccination and inoculation against influenza  Flu vaccine greater than or equal to 76yo preservative free IM  6. Atrophic vaginitis       PLEASE RESTART PREMARIN CREAM EVERY NIGHT FOR ONE WEEK AND THEN USE PREMARIN CREAM TWICE WEEKLY FOR ONE MONTH. PLEASE RETShingles Shingles is caused by the same virus that causes chickenpox (varicella zoster virus or VZV). Shingles often occurs many years or decades after having chickenpox. That is why it is more common in adults older than 50 years. The virus reactivates and breaks out as an infection in a nerve root. SYMPTOMS   The initial feeling (sensations) may be pain. This pain is usually described as:   Burning.   Stabbing.   Throbbing.   Tingling in the nerve root.   A red rash will follow in a couple days. The rash may occur in any area of the body and is usually on one side (unilateral) of the body in a band or belt-like pattern. The rash usually starts out as very small blisters (vesicles). They will dry up after 7 to 10 days. This is not usually a significant problem except for the pain it causes.   Long-lasting (chronic) pain is more likely in an elderly person. It can last months to years. This condition is called postherpetic neuralgia.  Shingles can be an extremely severe infection in someone with AIDS, a weakened immune system, or with forms of leukemia. It can also be severe if you are taking transplant medicines or other medicines that weaken the immune system. TREATMENT  Your caregiver will often treat you with:  Antiviral drugs.   Anti-inflammatory drugs.   Pain medicines.  Bed rest is very important in preventing the pain associated with herpes  zoster (postherpetic neuralgia). Application of heat in the form of a hot water bottle or electric heating pad or gentle pressure with the hand is recommended to help with the pain or discomfort. PREVENTION  A varicella zoster vaccine is available to help protect against the virus. The Food and Drug Administration approved the varicella zoster vaccine for individuals 42 years of age and older. HOME CARE INSTRUCTIONS   Cool compresses to the area of rash may be helpful.   Only take over-the-counter or prescription medicines for pain, discomfort, or fever as directed by your caregiver.   Avoid contact with:   Babies.   Pregnant women.   Children with eczema.   Elderly people with transplants.   People with chronic illnesses, such as leukemia and AIDS.   If the area involved is on your face, you may receive a referral for follow-up to a specialist. It is very important to keep all follow-up appointments. This will help avoid eye complications, chronic pain, or disability.  SEEK IMMEDIATE MEDICAL CARE IF:   You develop any pain (headache) in the area of the face or eye. This must be followed carefully by your caregiver or ophthalmologist. An infection in part of your eye (cornea) can be very serious. It could lead to blindness.   You do not have pain relief from prescribed medicines.   Your redness or swelling spreads.   The area involved becomes very swollen and painful.   You have a fever.  You notice any red or painful lines extending away from the affected area toward your heart (lymphangitis).   Your condition is worsening or has changed.  Document Released: 12/29/2004 Document Revised: 12/18/2010 Document Reviewed: 12/03/2008 [facility] Patient Information 2012 [place], [place].URN IN ONE MONTH FOR REPEAT URINE AND FOLLOW-UP OF BURNING WITH URINATION.  THIS WILL BE APPROXIMATELY 11/13/2011.

## 2011-10-11 NOTE — Progress Notes (Signed)
[address]   [place], [place]  [address]   [phone]  Subjective:    Patient ID: Crystal Kerr, female    DOB: 29-May-1924, 78 y.o.   MRN: [id number]  HPI  This 78 y.o. female presents for evaluation of the following:  1. Facial lesions:  Onset one week ago while on cruise.  Onset R chin, lip, and now new lesion R lateral jaw.  +itching a lot initially for one day.  Then started burning, soreness. Then rash developed.  Thought was bites at first.  +swelling around lesions.  Onset of redness after first eruption.  Feels badly but also tired.  Rash also involed preauricular region.  +sores in mouth and tongue.  Has lower dentures which may have aggravated tongue.  Worried about recurrent shingles; history of shingles in past.    2. L eye tearing:  Chronic issue for two months.  No redness to conjunctiva.  Some burning.  No blurred vision.  No eye pain.  No foreign body sensation.  No photophobia.  No history of allergic rhinitis or conjunctivitis.  No previous ophthalmology evaluation.  2.  Dysuria: evaluated 05/2011 for dysuria; urine culture negative x 2 in 05/2011 but large amount of blood and WBC in urine.  +vaginal irritation chronic.  Intermittent pain with wiping.  No vaginal discharge.  Dx with atrophic vaginitis by Copland at last visit; rx for Premarin cream; used Premarin cream for one week without improvement so stopped medication.  Dysuria has continued.  No gross hematuria.  No flank pain.  No n/v. No fever/chills/sweats.  Dr. Patsy Lager performed pelvic exam at last visit per patient; not interested in repeat pelvic exam today.   Review of Systems  Constitutional: Positive for fatigue. Negative for fever, chills and diaphoresis.  Eyes: Negative for photophobia, pain, discharge, redness, itching and visual disturbance.  Gastrointestinal: Negative for nausea, vomiting and abdominal pain.  Genitourinary: Positive for dysuria. Negative for urgency, frequency, hematuria, flank  pain, vaginal bleeding, vaginal discharge, genital sores and pelvic pain.  Skin: Positive for color change, rash and wound. Negative for pallor.    Past Medical History  Diagnosis Date  . Asthma     No past surgical history on file.  Prior to Admission medications   Medication Sig Start Date End Date Taking? Authorizing Provider  ADVAIR DISKUS 250-50 MCG/DOSE AEPB INHALE 1 PUFF TWICE DAILY. RINSE MOUTH AFTER USE. 07/24/11  Yes Ryan M Dunn, PA-C  diltiazem [facility]) 120 MG 24 hr capsule Take 1 capsule (120 mg total) by mouth daily. 06/02/11 06/01/12 Yes Gwenlyn Found Copland, MD  fish oil-omega-3 fatty acids 1000 MG capsule Take 2 g by mouth daily.   Yes Historical Provider, MD  montelukast (SINGULAIR) 10 MG tablet Take one daily 06/02/11  Yes Gwenlyn Found Copland, MD  Multiple Vitamin (MULTIVITAMIN) tablet Take 1 tablet by mouth daily.   Yes Historical Provider, MD  Fluticasone-Salmeterol (ADVAIR) 100-50 MCG/DOSE AEPB Inhale 1 puff into the lungs every 12 (twelve) hours.    Historical Provider, MD  gabapentin (NEURONTIN) 100 MG capsule 1 tablet at bedtime x 3 days, then 1 tablet bid x 3 days, then 1 po tid PRN shingles pain 10/11/11   Ethelda Chick, MD  predniSONE (DELTASONE) 20 MG tablet 2 daily x 5 days then 1 daily x 5 days 10/11/11   Ethelda Chick, MD  valACYclovir (VALTREX) 1000 MG tablet Take 1 tablet (1,000 mg total) by mouth 3 (three) times daily. 10/11/11   Silva Bandy  Dwan Bolt, MD    No Known Allergies  History   Social History  . Marital Status: Unknown    Spouse Name: N/A    Number of Children: N/A  . Years of Education: N/A   Occupational History  . Not on file.   Social History Main Topics  . Smoking status: Former Smoker    Quit date: 04/13/1981  . Smokeless tobacco: Never Used  . Alcohol Use: Not on file  . Drug Use: Not on file  . Sexually Active: Not on file   Other Topics Concern  . Not on file   Social History Narrative  . No narrative on file    No family history  on file.      Objective:   Physical Exam  Nursing note and vitals reviewed. Constitutional: She is oriented to person, place, and time. She appears well-developed and well-nourished. No distress.  HENT:  Head: Normocephalic and atraumatic.  Right Ear: External ear normal.  Left Ear: External ear normal.  Nose: Nose normal.  Mouth/Throat: Oropharynx is clear and moist.       HEALING R LATERAL TONGUE ULCERATION; SLIGHT BUCCAL IRRITATION R.  Eyes: Conjunctivae normal and EOM are normal. Pupils are equal, round, and reactive to light. Right eye exhibits no discharge. Left eye exhibits no discharge.       L EYE TEARING WITHOUT DISCHARGE.  Neck: Normal range of motion. Neck supple. No thyromegaly present.  Cardiovascular: Normal rate, regular rhythm and normal heart sounds.  Exam reveals no gallop and no friction rub.   No murmur heard.      FREQUENT PACS.  Pulmonary/Chest: Effort normal and breath sounds normal.  Lymphadenopathy:    She has no cervical adenopathy.  Neurological: She is alert and oriented to person, place, and time.  Skin: She is not diaphoretic.       HEALING ESCHAR ULCERATIVE LESION R MANDIBLE.  CLUSTER OF SMALL ESCHAR LESIONS R LATERAL JAWLINE.    Psychiatric: She has a normal mood and affect. Her behavior is normal. Judgment and thought content normal.          Results for orders placed in visit on 10/11/11  POCT UA - MICROSCOPIC ONLY      Component Value Range   WBC, Ur, HPF, POC 13-20     RBC, urine, microscopic 5-7     Bacteria, U Microscopic negative     Mucus, UA negative     Epithelial cells, urine per micros 1-3     Crystals, Ur, HPF, POC negative     Casts, Ur, LPF, POC negative     Yeast, UA negative    POCT URINALYSIS DIPSTICK      Component Value Range   Color, UA yellow     Clarity, UA slightly cloudy     Glucose, UA negative     Bilirubin, UA negative     Ketones, UA negative     Spec Grav, UA 1.015     Blood, UA moderate     pH,  UA 7.0     Protein, UA 100     Urobilinogen, UA 0.2     Nitrite, UA negative     Leukocytes, UA large (3+)      Assessment & Plan:   1. Burning with urination    2. Microhematuria  POCT UA - Microscopic Only, POCT urinalysis dipstick  3. Herpes zoster  valACYclovir (VALTREX) 1000 MG tablet, predniSONE (DELTASONE) 20 MG tablet, gabapentin (NEURONTIN) 100 MG  capsule  4. Eye tearing    5. Need for prophylactic vaccination and inoculation against influenza  Flu vaccine greater than or equal to 3yo preservative free IM  6. Atrophic vaginitis      1.  Dysuria:  Chronic for past six months. Urine culture negative x 2 in past six months yet hematuria, pyuria present on repeat u/a.  Rechallenge with Premarin cream qhs x 1 week and then twice weekly for next month.  RTC one month for follow-up and repeat u/a.  If blood and WBC still present at follow-up, will warrant referral to urology.   2. Hematuria:  Persistent.  Treat with Premarin cream for atrophic vaginitis.  If persists at 1 month follow-up, refer to urology. 3.  Atrophic Vaginitis: uncontrolled; restart Premarin vaginal cream. 4.  Herpes Zoster:  New.  One week duration.  Rx for Valtrex despite one week duration, Prednisone, Neurontin.   5.  Eye Tearing L: new.  Recommend opthalmology evaluation; pt declined trial of allergy eye drop. 6.  S/p flu vaccine.

## 2011-10-12 NOTE — Progress Notes (Signed)
Reviewed and agree.

## 2011-10-15 ENCOUNTER — Ambulatory Visit (INDEPENDENT_AMBULATORY_CARE_PROVIDER_SITE_OTHER): Payer: Medicare Other | Admitting: Emergency Medicine

## 2011-10-15 VITALS — BP 152/84 | HR 86 | Temp 98.1°F | Resp 22 | Ht 61.0 in | Wt 188.4 lb

## 2011-10-15 DIAGNOSIS — J45902 Unspecified asthma with status asthmaticus: Secondary | ICD-10-CM | POA: Diagnosis not present

## 2011-10-15 MED ORDER — ALBUTEROL SULFATE HFA 108 (90 BASE) MCG/ACT IN AERS: 2.0000 | INHALATION_SPRAY | RESPIRATORY_TRACT | Status: DC | PRN

## 2011-10-15 MED ORDER — GUAIFENESIN ER 600 MG PO TB12: 600.0000 mg | ORAL_TABLET | Freq: Two times a day (BID) | ORAL | Status: DC

## 2011-10-15 MED ORDER — HYDROCOD POLST-CHLORPHEN POLST 10-8 MG/5ML PO LQCR: 5.0000 mL | Freq: Two times a day (BID) | ORAL | Status: DC | PRN

## 2011-10-15 MED ORDER — CLARITHROMYCIN ER 500 MG PO TB24: 1000.0000 mg | ORAL_TABLET | Freq: Every day | ORAL | Status: DC

## 2011-10-15 NOTE — Progress Notes (Signed)
[facility] [address] [place] [address] [phone]  Date:  10/15/2011   Name:  Crystal Kerr   DOB:  [birth year]   MRN:  [id number]  PCP:  Abbe Amsterdam, MD    Chief Complaint: Cough   History of Present Illness:  Crystal Kerr is a 78 y.o. very pleasant female patient who presents with the following:  Ill since the first of the week with a "cold" and has not improved.  Now has cough and wheezing.  Has a purulent productive cough.  Has some mucoid nasal discharge.  Denies fever or chills.  Using rescue inhaler frequently for wheezing.  No nausea or vomiting, no stool change, chest pain or other complaints.   Daughter ill at home as well.    Patient Active Problem List  Diagnosis  . PVC's (premature ventricular contractions)  . Murmur, cardiac    Past Medical History  Diagnosis Date  . Asthma     No past surgical history on file.  History  Substance Use Topics  . Smoking status: Former Smoker    Quit date: 04/13/1981  . Smokeless tobacco: Never Used  . Alcohol Use: Not on file    No family history on file.  No Known Allergies  Medication list has been reviewed and updated.  Current Outpatient Prescriptions on File Prior to Visit  Medication Sig Dispense Refill  . ADVAIR DISKUS 250-50 MCG/DOSE AEPB INHALE 1 PUFF TWICE DAILY. RINSE MOUTH AFTER USE.  1 each  5  . diltiazem (TIAZAC) 120 MG 24 hr capsule Take 1 capsule (120 mg total) by mouth daily.  90 capsule  2  . fish oil-omega-3 fatty acids 1000 MG capsule Take 2 g by mouth daily.      Marland Kitchen gabapentin (NEURONTIN) 100 MG capsule 1 tablet at bedtime x 3 days, then 1 tablet bid x 3 days, then 1 po tid PRN shingles pain  90 capsule  1  . montelukast (SINGULAIR) 10 MG tablet Take one daily  90 tablet  3  . Multiple Vitamin (MULTIVITAMIN) tablet Take 1 tablet by mouth daily.      . predniSONE (DELTASONE) 20 MG tablet 2 daily x 5 days then 1 daily x 5 days  15 tablet  0  .  valACYclovir (VALTREX) 1000 MG tablet Take 1 tablet (1,000 mg total) by mouth 3 (three) times daily.  30 tablet  0  . Fluticasone-Salmeterol (ADVAIR) 100-50 MCG/DOSE AEPB Inhale 1 puff into the lungs every 12 (twelve) hours.        Review of Systems:  As per HPI, otherwise negative.    Physical Examination: Filed Vitals:   10/15/11 1704  BP: 152/84  Pulse: 86  Temp: 98.1 F (36.7 C)  Resp: 22   Filed Vitals:   10/15/11 1704  Height: 5\' 1"  (1.549 m)  Weight: 188 lb 6.4 oz (85.458 kg)   Body mass index is 35.60 kg/(m^2). Ideal Body Weight: Weight in (lb) to have BMI = 25: 132   GEN: WDWN, NAD, Non-toxic, A & O x 3  No rash or sepsis.  No shortness of breath. HEENT: Atraumatic, Normocephalic. Neck supple. No masses, No LAD. Oropharynx negative Ears and Nose: No external deformity.TM Clear CV: RRR, No M/G/R. No JVD. No thrill. No extra heart sounds. PULM: CTA B, diffuse wheezes, no crackles, rhonchi. No retractions. No resp. distress. No accessory muscle use. ABD: S, NT, ND, +BS. No rebound. No HSM. EXTR: No c/c/e NEURO Normal gait.  PSYCH: Normally interactive. Conversant. Not depressed or anxious appearing.  Calm demeanor.    Assessment and Plan: Bronchitis Exacerbation asthma biaxin tussionex Albuterol Follow up as needed  Carmelina Dane, MD  I have reviewed and agree with documentation. Robert P. Merla Riches, M.D.

## 2012-02-06 ENCOUNTER — Other Ambulatory Visit: Payer: Self-pay | Admitting: Physician Assistant

## 2012-02-08 NOTE — Telephone Encounter (Signed)
Please pull paper chart.  

## 2012-02-09 ENCOUNTER — Telehealth: Payer: Self-pay

## 2012-02-09 NOTE — Telephone Encounter (Signed)
[phone] daughter- Meriam Sprague. Wants to know if its possible to get her mothers rx for advair filled as soon as possible (by today). They have already called several times, she was told to be patient that were working on it as soon as possible. Wants to speak to clinical about refills and if its being worked on currently. Please call back at [phone]

## 2012-02-10 MED ORDER — FLUTICASONE-SALMETEROL 250-50 MCG/DOSE IN AEPB: 1.0000 | INHALATION_SPRAY | Freq: Two times a day (BID) | RESPIRATORY_TRACT | Status: DC

## 2012-02-10 NOTE — Telephone Encounter (Signed)
Medication refilled

## 2012-02-10 NOTE — Telephone Encounter (Signed)
Chart pulled, please advise.

## 2012-02-10 NOTE — Telephone Encounter (Signed)
Patient's daughter [name] notified and voiced understanding. 

## 2012-02-19 NOTE — Telephone Encounter (Signed)
Unable to locate paper chart DOS 11/19/10

## 2012-03-03 ENCOUNTER — Other Ambulatory Visit: Payer: Self-pay | Admitting: Family Medicine

## 2012-03-04 NOTE — Telephone Encounter (Signed)
Needs office visit.

## 2012-04-08 ENCOUNTER — Ambulatory Visit: Payer: BLUE CROSS/BLUE SHIELD | Admitting: Cardiovascular Disease

## 2012-04-11 ENCOUNTER — Encounter: Payer: Self-pay | Admitting: *Deleted

## 2012-04-12 ENCOUNTER — Ambulatory Visit (INDEPENDENT_AMBULATORY_CARE_PROVIDER_SITE_OTHER): Payer: Medicare Other | Admitting: Cardiovascular Disease

## 2012-04-12 ENCOUNTER — Encounter: Payer: Self-pay | Admitting: Cardiovascular Disease

## 2012-04-12 VITALS — BP 152/68 | HR 74 | Ht 63.0 in | Wt 191.0 lb

## 2012-04-12 DIAGNOSIS — I4949 Other premature depolarization: Secondary | ICD-10-CM

## 2012-04-12 DIAGNOSIS — I493 Ventricular premature depolarization: Secondary | ICD-10-CM

## 2012-04-12 DIAGNOSIS — R0600 Dyspnea, unspecified: Secondary | ICD-10-CM | POA: Insufficient documentation

## 2012-04-12 DIAGNOSIS — R0989 Other specified symptoms and signs involving the circulatory and respiratory systems: Secondary | ICD-10-CM | POA: Diagnosis not present

## 2012-04-12 DIAGNOSIS — R0609 Other forms of dyspnea: Secondary | ICD-10-CM | POA: Diagnosis not present

## 2012-04-12 NOTE — Assessment & Plan Note (Signed)
Her symptoms seems to have improved significantly with diltiazem. This does not seem to be an issue at this point.

## 2012-04-12 NOTE — Assessment & Plan Note (Signed)
She is having increased exertional dyspnea and fatigue without chest pain. Baseline ECG is within normal limits. Echocardiogram last year was overall unremarkable with normal LV systolic function. I don't see evidence of fluid overload. Her dyspnea is likely multifactorial due to known history of asthma and physical deconditioning. However, atypical manifestation of obstructive coronary artery disease is a possibility as well. I have suggested proceeding with a stress test. However, she feels that her symptoms are not severe enough to warrant this and would like to avoid this if possible. I instructed her to call us if symptoms worsen. I also advised her to followup with her primary care physician to look for other etiologies.

## 2012-04-12 NOTE — Progress Notes (Signed)
HPI  This is an 79 year old female who is here today for a followup visit. She was seen last year for evaluation of dizziness and PVCs. She was noted to be orthostatic by physical exam. Echocardiogram showed normal LV systolic function without significant valvular abnormalities. Holter monitor confirmed excessive PVCs. 20% of her beats were actually PVCs. She was started on diltiazem extended release 120 mg once daily with significant improvement in her symptoms. Her dizziness resolved completely. She is coming back complaining of excessive fatigue and occasional shortness of breath. She denies any chest pain. She has known history of asthma. She has not been as active during this winter season.  No Known Allergies   Current Outpatient Prescriptions on File Prior to Visit  Medication Sig Dispense Refill  . albuterol (PROVENTIL HFA;VENTOLIN HFA) 108 (90 BASE) MCG/ACT inhaler Inhale 2 puffs into the lungs every 4 (four) hours as needed for wheezing (cough, shortness of breath or wheezing.).  1 Inhaler  1  . diltiazem (TIAZAC) 120 MG 24 hr capsule Take 1 capsule (120 mg total) by mouth daily.  90 capsule  2  . fish oil-omega-3 fatty acids 1000 MG capsule Take 2 g by mouth daily.      . Fluticasone-Salmeterol (ADVAIR DISKUS) 250-50 MCG/DOSE AEPB Inhale 1 puff into the lungs 2 (two) times daily.  1 each  5  . montelukast (SINGULAIR) 10 MG tablet Take one daily  90 tablet  3  . Multiple Vitamin (MULTIVITAMIN) tablet Take 1 tablet by mouth daily.      Marland Kitchen guaiFENesin (MUCINEX) 600 MG 12 hr tablet Take 1 tablet (600 mg total) by mouth 2 (two) times daily.  20 tablet  0  . predniSONE (DELTASONE) 20 MG tablet 2 daily x 5 days then 1 daily x 5 days  15 tablet  0   No current facility-administered medications on file prior to visit.     Past Medical History  Diagnosis Date  . Asthma   . PVC's (premature ventricular contractions)   . Murmur, cardiac      No past surgical history on file.   No  family history on file.   History   Social History  . Marital Status: Unknown    Spouse Name: N/A    Number of Children: N/A  . Years of Education: N/A   Occupational History  . Not on file.   Social History Main Topics  . Smoking status: Former Smoker    Quit date: 04/13/1981  . Smokeless tobacco: Never Used  . Alcohol Use: Not on file  . Drug Use: Not on file  . Sexually Active: Not on file   Other Topics Concern  . Not on file   Social History Narrative  . No narrative on file      PHYSICAL EXAM   BP 152/68  Pulse 74  Ht 5\' 3"  (1.6 m)  Wt 191 lb (86.637 kg)  BMI 33.84 kg/m2  Constitutional: She is oriented to person, place, and time. She appears well-developed and well-nourished. No distress.  HENT: No nasal discharge.  Head: Normocephalic and atraumatic.  Eyes: Pupils are equal and round. Right eye exhibits no discharge. Left eye exhibits no discharge.  Neck: Normal range of motion. Neck supple. No JVD present. No thyromegaly present.  Cardiovascular: Normal rate with occasional premature beats, regular rhythm, normal heart sounds. Exam reveals no gallop and no friction rub. No murmur heard.  Pulmonary/Chest: Effort normal and breath sounds normal. No stridor. No respiratory distress.  She has no wheezes. She has no rales. She exhibits no tenderness.  Abdominal: Soft. Bowel sounds are normal. She exhibits no distension. There is no tenderness. There is no rebound and no guarding.  Musculoskeletal: Normal range of motion. She exhibits no edema and no tenderness.  Neurological: She is alert and oriented to person, place, and time. Coordination normal.  Skin: Skin is warm and dry. No rash noted. She is not diaphoretic. No erythema. No pallor.  Psychiatric: She has a normal mood and affect. Her behavior is normal. Judgment and thought content normal.    EKG: Normal sinus rhythm with no significant ST or T wave changes.  ASSESSMENT AND PLAN

## 2012-04-12 NOTE — Patient Instructions (Addendum)
Your physician recommends that you schedule a follow-up appointment in: as needed  

## 2012-04-21 ENCOUNTER — Ambulatory Visit: Payer: Medicare Other

## 2012-04-21 ENCOUNTER — Ambulatory Visit (INDEPENDENT_AMBULATORY_CARE_PROVIDER_SITE_OTHER): Payer: Medicare Other | Admitting: Family Medicine

## 2012-04-21 VITALS — BP 132/66 | HR 100 | Temp 97.9°F | Resp 24 | Ht 62.0 in | Wt 192.4 lb

## 2012-04-21 DIAGNOSIS — R0602 Shortness of breath: Secondary | ICD-10-CM

## 2012-04-21 DIAGNOSIS — M549 Dorsalgia, unspecified: Secondary | ICD-10-CM

## 2012-04-21 DIAGNOSIS — R05 Cough: Secondary | ICD-10-CM

## 2012-04-21 DIAGNOSIS — R5381 Other malaise: Secondary | ICD-10-CM

## 2012-04-21 LAB — COMPREHENSIVE METABOLIC PANEL
ALT: 16 U/L (ref 0–35)
AST: 19 U/L (ref 0–37)
Albumin: 4.1 g/dL (ref 3.5–5.2)
Alkaline Phosphatase: 106 U/L (ref 39–117)
BUN: 25 mg/dL — ABNORMAL HIGH (ref 6–23)
Potassium: 4 mEq/L (ref 3.5–5.3)

## 2012-04-21 LAB — POCT CBC
Granulocyte percent: 62.3 %G (ref 37–80)
Hemoglobin: 12.8 g/dL (ref 12.2–16.2)
MCH, POC: 27.2 pg (ref 27–31.2)
MID (cbc): 0.8 (ref 0–0.9)
MPV: 9.6 fL (ref 0–99.8)
POC Granulocyte: 6.4 (ref 2–6.9)
POC MID %: 8 %M (ref 0–12)
Platelet Count, POC: 270 10*3/uL (ref 142–424)
RBC: 4.7 M/uL (ref 4.04–5.48)
WBC: 10.2 10*3/uL (ref 4.6–10.2)

## 2012-04-21 LAB — TSH: TSH: 2.094 u[IU]/mL (ref 0.350–4.500)

## 2012-04-21 MED ORDER — DOXYCYCLINE HYCLATE 100 MG PO TABS: 100.0000 mg | ORAL_TABLET | Freq: Two times a day (BID) | ORAL | Status: DC

## 2012-04-21 NOTE — Progress Notes (Addendum)
[facility] [address] [place] [address] [phone]  Date:  04/21/2012   Name:  Crystal Kerr   DOB:  07-06-1924   MRN:  [id number]  PCP:  Abbe Amsterdam, MD    Chief Complaint: Arthritis and Shortness of Breath   History of Present Illness:  Crystal Kerr is a 79 y.o. very pleasant female patient who presents with the following:  Last seen by me about one year ago.   She has a history of asthma.  She has noted that she feels more SOB than she has in the past, which she attributes to asthma and allergy symptoms.  This has been going on for about one month- she saw her cardiologist for this issue 10 days ago. He recommended a stress test, but she has not wanted to do this so far.   The SOB does not seem to be caused by activity, but she admits that she does get out of breath with walking more easily than usual.  They had the carpet removed from their home which has helped improve her symptoms so she wonders if allergies are related   She has been using her albuterol, which does help reduce her SOB.  Also using advair.    She also notes gradual worsening of the pain in her back for the last 3 months.  The pain is located in her spine, from her neck to her lower back.  As soon as she sits down the pain does away.  She also has pain in both shoulders- if she sleeps on her side the pain in her shoulders can wake her up. Her right shoulder is worse than the left  She does note some persistent cough- usually cough is dry.   She was a smoker in her youth- she smoked for about 20 years but quit in her mid- 110s.   She has not noted any chest pain.  Her palpitations are much better now that she is on the diltiazem Patient Active Problem List  Diagnosis  . PVC's (premature ventricular contractions)  . Murmur, cardiac  . Dyspnea    Past Medical History  Diagnosis Date  . Asthma   . PVC's (premature ventricular contractions)   . Murmur, cardiac      No past surgical history on file.  History  Substance Use Topics  . Smoking status: Former Smoker    Quit date: 04/13/1981  . Smokeless tobacco: Never Used  . Alcohol Use: Not on file    No family history on file.  No Known Allergies  Medication list has been reviewed and updated.  Current Outpatient Prescriptions on File Prior to Visit  Medication Sig Dispense Refill  . albuterol (PROVENTIL HFA;VENTOLIN HFA) 108 (90 BASE) MCG/ACT inhaler Inhale 2 puffs into the lungs every 4 (four) hours as needed for wheezing (cough, shortness of breath or wheezing.).  1 Inhaler  1  . diltiazem (TIAZAC) 120 MG 24 hr capsule Take 1 capsule (120 mg total) by mouth daily.  90 capsule  2  . fish oil-omega-3 fatty acids 1000 MG capsule Take 2 g by mouth daily.      . Fluticasone-Salmeterol (ADVAIR DISKUS) 250-50 MCG/DOSE AEPB Inhale 1 puff into the lungs 2 (two) times daily.  1 each  5  . montelukast (SINGULAIR) 10 MG tablet Take one daily  90 tablet  3  . Multiple Vitamin (MULTIVITAMIN) tablet Take 1 tablet by mouth daily.      Marland Kitchen guaiFENesin (  MUCINEX) 600 MG 12 hr tablet Take 1 tablet (600 mg total) by mouth 2 (two) times daily.  20 tablet  0  . predniSONE (DELTASONE) 20 MG tablet 2 daily x 5 days then 1 daily x 5 days  15 tablet  0   No current facility-administered medications on file prior to visit.    Review of Systems:  As per HPI- otherwise negative.   Physical Examination: Filed Vitals:   04/21/12 1303  BP: 132/66  Pulse: 100  Temp: 97.9 F (36.6 C)  Resp: 24   Filed Vitals:   04/21/12 1303  Height: 5\' 2"  (1.575 m)  Weight: 192 lb 6.4 oz (87.272 kg)   Body mass index is 35.18 kg/(m^2). Ideal Body Weight: Weight in (lb) to have BMI = 25: 136.4  GEN: WDWN, NAD, Non-toxic, A & O x 3, overweight HEENT: Atraumatic, Normocephalic. Neck supple. No masses, No LAD.  Hearing aids bilaterally, PEERL, oropharynx wnl Ears and Nose: No external deformity. CV: RRR with PVCs, No  M/G/R. No JVD. No thrill. No extra heart sounds. PULM: CTA B, no wheezes, crackles, rhonchi. No retractions. No resp. distress. No accessory muscle use. ABD: S, NT, ND EXTR: No c/c/e NEURO Normal gait.  PSYCH: Normally interactive. Conversant. Not depressed or anxious appearing.  Calm demeanor.  Back; she is tender along her spine, especially in the mid- thoracic area.  Lateral cervical ROM is limited to 45 degrees bilaterally  Performed PFTs today: she does not appear to have COPD.  FEV1% 83% of predicted, FVC 94%, FEV1 77%  Results for orders placed in visit on 04/21/12  POCT CBC      Result Value Range   WBC 10.2  4.6 - 10.2 K/uL   Lymph, poc 3.0  0.6 - 3.4   POC LYMPH PERCENT 29.7  10 - 50 %L   MID (cbc) 0.8  0 - 0.9   POC MID % 8.0  0 - 12 %M   POC Granulocyte 6.4  2 - 6.9   Granulocyte percent 62.3  37 - 80 %G   RBC 4.70  4.04 - 5.48 M/uL   Hemoglobin 12.8  12.2 - 16.2 g/dL   HCT, POC 96.0  45.4 - 47.9 %   MCV 88.0  80 - 97 fL   MCH, POC 27.2  27 - 31.2 pg   MCHC 30.9 (*) 31.8 - 35.4 g/dL   RDW, POC 09.8     Platelet Count, POC 270  142 - 424 K/uL   MPV 9.6  0 - 99.8 fL   EKG:  Compared to EKG from 04/12/12 and did not note any acute change.  She does have low voltage and one PVC noted on strip  [facility] reading (PRIMARY) by  Dr. Patsy Lager. CXR: slightly elarged lung volumes, otherwise negative Cervical spine: moderate degenerative changes at all levels Thoracic spine: moderate to severe degenerative changes Lumbar spine: calcified aorta, mild degenerative change  CHEST - 2 VIEW  Comparison: 10/19/2005  Findings: Increasing opacity in the lingula. This is worrisome for atelectasis verses airspace disease. It may simply represent increasing epicardial fat. The patient has required more subcutaneous fat since the prior study. Calcified granuloma in the left upper lobe. Interstitial prominence and bronchitic changes are stable elsewhere in the lungs. No  pneumothorax.  IMPRESSION: Increasing opacity in the lingula as described above. Follow-up studies after appropriate antibiotic therapy are recommended.  THORACIC SPINE - 2 VIEW  Comparison: 10/19/2005  Findings: Moderate dextroscoliosis of the mid thoracic  spine. No obvious vertebral compression deformity. Vertebral bodies are poorly visualized on the lateral view. No obvious acute fracture.  IMPRESSION: No acute bony pathology. Chronic changes.   LUMBAR SPINE - 2-3 VIEW  Comparison: None.  Findings: 3 mm retrolisthesis L4 upon L5 with severe narrowing. 12 mm retrolisthesis L5 of -1 with severe narrowing. Moderate narrowing at L1-2, L2-3, and L3-4. No vertebral compression deformity. Prominent vascular calcifications. Pars defects are suspected at L5.  IMPRESSION: Pars defects at L5 are suspected with suspected grade 2 spondylolisthesis.  Mild anterolisthesis L4 upon L5.  Multilevel degenerative change.   CERVICAL SPINE - 2-3 VIEW  Comparison: None.  Findings: There is straightening of the normally lordotic cervical spine through the top of C6. C7 and T1 are obscured. Severe narrowing at C3-4, C4-5, and C5-6 with posterior osteophytic ridging. Unremarkable prevertebral soft tissues. No vertebral compression deformity.  IMPRESSION: Limited study. Degenerative change  Results for orders placed in visit on 04/21/12  TSH      Result Value Range   TSH      COMPREHENSIVE METABOLIC PANEL      Result Value Range   Sodium 137  135 - 145 mEq/L   Potassium 4.0  3.5 - 5.3 mEq/L   Chloride 102  96 - 112 mEq/L   CO2 25  19 - 32 mEq/L   Glucose, Bld 149 (*) 70 - 99 mg/dL   BUN 25 (*) 6 - 23 mg/dL   Creat 4.09 (*) 8.11 - 1.10 mg/dL   Total Bilirubin 0.3  0.3 - 1.2 mg/dL   Alkaline Phosphatase 106  39 - 117 U/L   AST 19  0 - 37 U/L   ALT 16  0 - 35 U/L   Total Protein 6.4  6.0 - 8.3 g/dL   Albumin 4.1  3.5 - 5.2 g/dL   Calcium 9.5  8.4 - 91.4 mg/dL  D-DIMER,  QUANTITATIVE      Result Value Range   D-Dimer, Quant 0.27  0.00 - 0.48 ug/mL-FEU  POCT CBC      Result Value Range   WBC 10.2  4.6 - 10.2 K/uL   Lymph, poc 3.0  0.6 - 3.4   POC LYMPH PERCENT 29.7  10 - 50 %L   MID (cbc) 0.8  0 - 0.9   POC MID % 8.0  0 - 12 %M   POC Granulocyte 6.4  2 - 6.9   Granulocyte percent 62.3  37 - 80 %G   RBC 4.70  4.04 - 5.48 M/uL   Hemoglobin 12.8  12.2 - 16.2 g/dL   HCT, POC 78.2  95.6 - 47.9 %   MCV 88.0  80 - 97 fL   MCH, POC 27.2  27 - 31.2 pg   MCHC 30.9 (*) 31.8 - 35.4 g/dL   RDW, POC 21.3     Platelet Count, POC 270  142 - 424 K/uL   MPV 9.6  0 - 99.8 fL    Assessment and Plan: SOB (shortness of breath) - Plan: DG Chest 2 View, D-dimer, quantitative  Other malaise and fatigue - Plan: POCT CBC, TSH, Comprehensive metabolic panel, EKG 12-Lead  Back pain - Plan: DG Cervical Spine 2 or 3 views, DG Thoracic Spine 2 View, DG Lumbar Spine 2-3 Views  Other malaise and fatigue, Active - Plan: POCT CBC, TSH, Comprehensive metabolic panel, EKG 12-Lead  Cough - Plan: doxycycline (VIBRA-TABS) 100 MG tablet  Persistent SOB and decreased exercise tolerance over the last month.  Would like to rule- out PE if possible.  She is amenable to doing a D dimer and following up on the result.  Received normal D dimer report and called to let her know. Will treat with doxycycline for possible airspace disease as per CXR report above.  She will let me know if any worsening of her symptoms.  She does not appear to have COPD.  Continue her medications for asthma.  Advised her that she may indeed have CAD causing her symptoms.  However, she does not wish to have a stress test which is reasonable given her age.    Will plan further follow- up pending labs.  Meds ordered this encounter  Medications  . doxycycline (VIBRA-TABS) 100 MG tablet    Sig: Take 1 tablet (100 mg total) by mouth 2 (two) times daily.    Dispense:  20 tablet    Refill:  0    Signed Abbe Amsterdam, MD  Called on 4/13 to go over labs- she had just eaten a large meal so glucose is not a concern. Minimal increase in creatinine is stable.  TSH normal.  Plan repeat CXR in 3 or 4 weeks- will order.  She will let me know if not feeling better, taking the doxy without a problem

## 2012-04-24 ENCOUNTER — Encounter: Payer: Self-pay | Admitting: Family Medicine

## 2012-06-09 ENCOUNTER — Other Ambulatory Visit: Payer: Self-pay | Admitting: Physician Assistant

## 2012-06-09 ENCOUNTER — Other Ambulatory Visit: Payer: Self-pay | Admitting: Family Medicine

## 2012-06-30 ENCOUNTER — Ambulatory Visit (INDEPENDENT_AMBULATORY_CARE_PROVIDER_SITE_OTHER): Payer: Medicare Other | Admitting: Family Medicine

## 2012-06-30 ENCOUNTER — Ambulatory Visit: Payer: Medicare Other

## 2012-06-30 VITALS — BP 145/73 | HR 83 | Temp 98.2°F | Resp 18 | Ht 62.0 in | Wt 190.4 lb

## 2012-06-30 DIAGNOSIS — J4520 Mild intermittent asthma, uncomplicated: Secondary | ICD-10-CM

## 2012-06-30 DIAGNOSIS — R5381 Other malaise: Secondary | ICD-10-CM

## 2012-06-30 DIAGNOSIS — R05 Cough: Secondary | ICD-10-CM | POA: Diagnosis not present

## 2012-06-30 DIAGNOSIS — R21 Rash and other nonspecific skin eruption: Secondary | ICD-10-CM | POA: Diagnosis not present

## 2012-06-30 DIAGNOSIS — R062 Wheezing: Secondary | ICD-10-CM

## 2012-06-30 DIAGNOSIS — M47819 Spondylosis without myelopathy or radiculopathy, site unspecified: Secondary | ICD-10-CM

## 2012-06-30 DIAGNOSIS — R059 Cough, unspecified: Secondary | ICD-10-CM

## 2012-06-30 LAB — BASIC METABOLIC PANEL
BUN: 22 mg/dL (ref 6–23)
Chloride: 103 mEq/L (ref 96–112)
Creat: 1 mg/dL (ref 0.50–1.10)

## 2012-06-30 MED ORDER — DOXYCYCLINE HYCLATE 100 MG PO TABS: 100.0000 mg | ORAL_TABLET | Freq: Two times a day (BID) | ORAL | Status: DC

## 2012-06-30 MED ORDER — PREDNISONE 20 MG PO TABS: ORAL_TABLET | ORAL | Status: DC

## 2012-06-30 NOTE — Patient Instructions (Addendum)
Take the doxycycline as needed, and use the prednisone (steroid) taper.  Let me know if you are not better in the next 48 hours Sooner if worse.

## 2012-06-30 NOTE — Progress Notes (Addendum)
[facility] [address], [place] [address] [phone]  Date:  06/30/2012   Name:  Crystal Kerr   DOB:  04/19/1924   MRN:  [id number]  PCP:  Abbe Amsterdam, MD    Chief Complaint: Shortness of Breath and Rash   History of Present Illness:  Crystal Kerr is a 79 y.o. very pleasant female patient who presents with the following:  Here today with SOB.  Seen in April of this year with SOB whch she felt was due to asthma/ allergies.  We did PFTs for her in April.  CXR showed increasing opacity in the lingula concerning for airspace disease vs. Epicardial fat pad.  We did a D dimer which was negative.    She has noted a rash which is itchy, and then will seem to cause tiny bumps.  It has been on her chest and now on her neck.    She again notes SOB, worse for the last few weeks.  She did get better after treatment in April, but admits that she forgot to have her repeat CXR.   She does have a dry cough.   She does note that her SOB is worse with any exertion.  "Sometimes I can go all day" without becoming SOB, but other times even mild exertion will cause SOB.   She has not noted any CP.  She has seen Dr. Kirke Corin with cardiology who suggested a stress test to ensure her SOB is not related to CAD.  She also was a smoker in the past, but has never been told that she has COPD.   She is taking advil and/ or tylenol for her back pain.  She notes that her shoulders hurt, and she has difficult with shoulder extension and internal rotation.    Patient Active Problem List   Diagnosis Date Noted  . Dyspnea 04/12/2012  . PVC's (premature ventricular contractions) 04/24/2011  . Murmur, cardiac 04/24/2011    Past Medical History  Diagnosis Date  . Asthma   . PVC's (premature ventricular contractions)   . Murmur, cardiac     No past surgical history on file.  History  Substance Use Topics  . Smoking status: Former Smoker    Quit date: 04/13/1981   . Smokeless tobacco: Never Used  . Alcohol Use: Not on file    No family history on file.  No Known Allergies  Medication list has been reviewed and updated.  Current Outpatient Prescriptions on File Prior to Visit  Medication Sig Dispense Refill  . albuterol (PROVENTIL HFA;VENTOLIN HFA) 108 (90 BASE) MCG/ACT inhaler Inhale 2 puffs into the lungs every 4 (four) hours as needed for wheezing (cough, shortness of breath or wheezing.).  1 Inhaler  1  . diltiazem (CARDIZEM CD) 120 MG 24 hr capsule TAKE 1 CAPSULE BY MOUTH DAILY  90 capsule  1  . fish oil-omega-3 fatty acids 1000 MG capsule Take 2 g by mouth daily.      . Fluticasone-Salmeterol (ADVAIR DISKUS) 250-50 MCG/DOSE AEPB Inhale 1 puff into the lungs 2 (two) times daily.  1 each  5  . montelukast (SINGULAIR) 10 MG tablet TAKE 1 TABLET BY MOUTH DAILY  90 tablet  1  . Multiple Vitamin (MULTIVITAMIN) tablet Take 1 tablet by mouth daily.      Marland Kitchen diltiazem (TIAZAC) 120 MG 24 hr capsule Take 1 capsule (120 mg total) by mouth daily.  90 capsule  2  . doxycycline (VIBRA-TABS) 100 MG tablet  Take 1 tablet (100 mg total) by mouth 2 (two) times daily.  20 tablet  0  . guaiFENesin (MUCINEX) 600 MG 12 hr tablet Take 1 tablet (600 mg total) by mouth 2 (two) times daily.  20 tablet  0   No current facility-administered medications on file prior to visit.    Review of Systems:  As per HPI- otherwise negative.   Physical Examination: Filed Vitals:   06/30/12 1000  BP: 145/73  Pulse: 83  Temp: 98.2 F (36.8 C)  Resp: 18   Filed Vitals:   06/30/12 1000  Height: 5\' 2"  (1.575 m)  Weight: 190 lb 6.4 oz (86.365 kg)   Body mass index is 34.82 kg/(m^2). Ideal Body Weight: Weight in (lb) to have BMI = 25: 136.4  GEN: WDWN, NAD, Non-toxic, A & O x 3, very spry for her age HEENT: Atraumatic, Normocephalic. Neck supple. No masses, No LAD.  Bilateral TM wnl, oropharynx normal.  PEERL,EOMI.   Ears and Nose: No external deformity. CV: RRR, No  M/G/R. No JVD. No thrill. No extra heart sounds. PULM: CTA B, no crackles, rhonchi. No retractions. No resp. distress. No accessory muscle use.  Minimal expiratory wheezes bilaterally  ABD: S, NT, ND, +BS. No rebound. No HSM. EXTR: No c/c/e NEURO Normal gait.  PSYCH: Normally interactive. Conversant. Not depressed or anxious appearing.  Calm demeanor.  I am unable to appreciate any definite rash- she seems to have some small skin tags and seborrheic keratoses on her chest.    [facility] reading (PRIMARY) by  Dr. Patsy Lager. CXR: stable from 2 months ago, no acute infiltrate.   CHEST - 2 VIEW  Comparison: 04/21/2012  Findings: Borderline cardiomegaly. Mild thoracic dextroscoliosis. No acute infiltrate or pulmonary edema. Stable basilar atelectasis or scarring.  IMPRESSION: No active disease. No significant change.  Clinically significant discrepancy from primary report, if  Assessment and Plan: Cough - Plan: DG Chest 2 View, doxycycline (VIBRA-TABS) 100 MG tablet, Basic metabolic panel  Wheezing - Plan: predniSONE (DELTASONE) 20 MG tablet  Rash and nonspecific skin eruption  Physical deconditioning - Plan: Ambulatory referral to [facility]  Crystal Kerr is here today with recurrent SOB.  It is difficult to determine if her sx are due to asthma, deconditioning, COPD or possibly CAD.  She would like to try a course of doxycycline and prednisone which has helped her in the past.  Discussed in detail with her- I understand her hesitation to undergo a stress test given her age.  However, she is generally quite healthy and will likely live several more years.  If she does have CAD treatment might improve her quality of life.  Also, if she does not have CAD we could consider having her see pulmonology to made sure we are optimally treating her asthma and to rule- out COPD.  She will think about this and call her cardiologist if she decides to have the stress test.  She also suffers from general  deconditioning, and has trouble with her shoulder ROM which makes it hard for her to dress.  She would like to do PT for this- will try to arrange PT that can come to her home.    Discussed her spine films from this spring- she does have significant degenerative changes and pars defect and spondylolisthesis at L5.  At this time she does not wish to pursue any other therapy for her back, and is satisfied with OTC medications for pain.   Signed Abbe Amsterdam, MD  Called to  check on her 6/24- she is feeling "10 times better."  Advised that her BMP is normal.

## 2012-07-01 DIAGNOSIS — J45909 Unspecified asthma, uncomplicated: Secondary | ICD-10-CM | POA: Insufficient documentation

## 2012-07-01 DIAGNOSIS — M47819 Spondylosis without myelopathy or radiculopathy, site unspecified: Secondary | ICD-10-CM | POA: Insufficient documentation

## 2012-07-18 ENCOUNTER — Telehealth: Payer: Self-pay | Admitting: Family Medicine

## 2012-07-18 NOTE — Telephone Encounter (Signed)
Called and LMOM.  We have not been able to find a therapist who will go to her home so far.  Would she be able to go to a PT facility?

## 2012-07-18 NOTE — Telephone Encounter (Signed)
Message copied by Pearline Cables on Mon Jul 18, 2012  7:29 PM ------      Message from: Mare Loan F      Created: Thu Jul 07, 2012  4:38 PM       No not that im aware of       ----- Message -----         From: Pearline Cables, MD         Sent: 07/03/2012  11:49 AM           To: Waymon Amato            Anyone is fine- do you know of any physical therapists who will go to the home?       ------

## 2012-07-24 ENCOUNTER — Telehealth: Payer: Self-pay | Admitting: Family Medicine

## 2012-07-24 NOTE — Telephone Encounter (Signed)
Message copied by Pearline Cables on Sun Jul 24, 2012  3:11 PM ------      Message from: Mare Loan F      Created: Tue Jul 19, 2012 10:35 AM       Yes and it is patient insurance they are not taking it that is why it would not work with [facility]      ----- Message -----         From: Pearline Cables, MD         Sent: 07/18/2012   7:32 PM           To: Waymon Amato            Have we tried [facility]?  The pt has dyspnea and asthma, which may qualify her for home PT services       ------

## 2012-07-24 NOTE — Telephone Encounter (Signed)
Called her back- LMOM that we are probably not able to arrange for home PT services.  Would she like to try visiting an outpt therapist a few times?  Please let me know

## 2012-08-04 ENCOUNTER — Other Ambulatory Visit: Payer: Self-pay | Admitting: Physician Assistant

## 2012-08-17 ENCOUNTER — Other Ambulatory Visit: Payer: Self-pay

## 2012-08-18 DIAGNOSIS — Z961 Presence of intraocular lens: Secondary | ICD-10-CM | POA: Diagnosis not present

## 2012-08-31 ENCOUNTER — Other Ambulatory Visit: Payer: Self-pay | Admitting: Family Medicine

## 2012-08-31 DIAGNOSIS — N952 Postmenopausal atrophic vaginitis: Secondary | ICD-10-CM

## 2012-09-02 NOTE — Telephone Encounter (Signed)
Dr Patsy Lager, you saw pt in June, but not for this Rx. Do you want to RF or have pt RTC to discuss?

## 2012-09-03 ENCOUNTER — Telehealth: Payer: Self-pay

## 2012-09-03 NOTE — Telephone Encounter (Signed)
[facility] states they have sent multiple requests for this patient for the premarin vaginal cream. Patient is completely out and asked the pharmacy to call us. Please call [facility] back at [phone].

## 2012-09-04 NOTE — Telephone Encounter (Signed)
Spoke with pharmacy and they did receive yesterday.

## 2012-09-04 NOTE — Telephone Encounter (Signed)
Looks like this was sent in on 8/20.

## 2012-09-17 ENCOUNTER — Ambulatory Visit (INDEPENDENT_AMBULATORY_CARE_PROVIDER_SITE_OTHER): Payer: Medicare Other | Admitting: Internal Medicine

## 2012-09-17 VITALS — BP 138/78 | HR 108 | Temp 98.3°F | Resp 16 | Ht 62.0 in | Wt 190.0 lb

## 2012-09-17 DIAGNOSIS — M25519 Pain in unspecified shoulder: Secondary | ICD-10-CM

## 2012-09-17 DIAGNOSIS — N390 Urinary tract infection, site not specified: Secondary | ICD-10-CM | POA: Diagnosis not present

## 2012-09-17 DIAGNOSIS — M549 Dorsalgia, unspecified: Secondary | ICD-10-CM

## 2012-09-17 DIAGNOSIS — G4700 Insomnia, unspecified: Secondary | ICD-10-CM

## 2012-09-17 DIAGNOSIS — M47817 Spondylosis without myelopathy or radiculopathy, lumbosacral region: Secondary | ICD-10-CM

## 2012-09-17 LAB — POCT CBC
Hemoglobin: 14.4 g/dL (ref 12.2–16.2)
Lymph, poc: 3.3 (ref 0.6–3.4)
MCH, POC: 28.9 pg (ref 27–31.2)
MCHC: 31.5 g/dL — AB (ref 31.8–35.4)
MCV: 91.5 fL (ref 80–97)
MID (cbc): 1 — AB (ref 0–0.9)
MPV: 9.9 fL (ref 0–99.8)
POC MID %: 7.9 %M (ref 0–12)
WBC: 12.2 10*3/uL — AB (ref 4.6–10.2)

## 2012-09-17 LAB — POCT UA - MICROSCOPIC ONLY
Crystals, Ur, HPF, POC: NEGATIVE
Mucus, UA: NEGATIVE

## 2012-09-17 LAB — POCT URINALYSIS DIPSTICK
Glucose, UA: NEGATIVE
Ketones, UA: NEGATIVE
Spec Grav, UA: 1.01
Urobilinogen, UA: 0.2

## 2012-09-17 MED ORDER — HYDROCODONE-ACETAMINOPHEN 5-325 MG PO TABS: 1.0000 | ORAL_TABLET | Freq: Four times a day (QID) | ORAL | Status: DC | PRN

## 2012-09-17 MED ORDER — CIPROFLOXACIN HCL 500 MG PO TABS: 500.0000 mg | ORAL_TABLET | Freq: Two times a day (BID) | ORAL | Status: DC

## 2012-09-17 NOTE — Patient Instructions (Addendum)
Urinary Tract Infection  Urinary tract infections (UTIs) can develop anywhere along your urinary tract. Your urinary tract is your body's drainage system for removing wastes and extra water. Your urinary tract includes two kidneys, two ureters, a bladder, and a urethra. Your kidneys are a pair of bean-shaped organs. Each kidney is about the size of your fist. They are located below your ribs, one on each side of your spine.  CAUSES  Infections are caused by microbes, which are microscopic organisms, including fungi, viruses, and bacteria. These organisms are so small that they can only be seen through a microscope. Bacteria are the microbes that most commonly cause UTIs.  SYMPTOMS   Symptoms of UTIs may vary by age and gender of the patient and by the location of the infection. Symptoms in young women typically include a frequent and intense urge to urinate and a painful, burning feeling in the bladder or urethra during urination. Older women and men are more likely to be tired, shaky, and weak and have muscle aches and abdominal pain. A fever may mean the infection is in your kidneys. Other symptoms of a kidney infection include pain in your back or sides below the ribs, nausea, and vomiting.  DIAGNOSIS  To diagnose a UTI, your caregiver will ask you about your symptoms. Your caregiver also will ask to provide a urine sample. The urine sample will be tested for bacteria and white blood cells. White blood cells are made by your body to help fight infection.  TREATMENT   Typically, UTIs can be treated with medication. Because most UTIs are caused by a bacterial infection, they usually can be treated with the use of antibiotics. The choice of antibiotic and length of treatment depend on your symptoms and the type of bacteria causing your infection.  HOME CARE INSTRUCTIONS   If you were prescribed antibiotics, take them exactly as your caregiver instructs you. Finish the medication even if you feel better after you  have only taken some of the medication.   Drink enough water and fluids to keep your urine clear or pale yellow.   Avoid caffeine, tea, and carbonated beverages. They tend to irritate your bladder.   Empty your bladder often. Avoid holding urine for long periods of time.   Empty your bladder before and after sexual intercourse.   After a bowel movement, women should cleanse from front to back. Use each tissue only once.  SEEK MEDICAL CARE IF:    You have back pain.   You develop a fever.   Your symptoms do not begin to resolve within 3 days.  SEEK IMMEDIATE MEDICAL CARE IF:    You have severe back pain or lower abdominal pain.   You develop chills.   You have nausea or vomiting.   You have continued burning or discomfort with urination.  MAKE SURE YOU:    Understand these instructions.   Will watch your condition.   Will get help right away if you are not doing well or get worse.  Document Released: 10/08/2004 Document Revised: 06/30/2011 Document Reviewed: 02/06/2011  [employer] Patient Information 2014 [employer]

## 2012-09-17 NOTE — Progress Notes (Signed)
Subjective:    Patient ID: Crystal Kerr, female    DOB: 09/22/1924, 79 y.o.   MRN: [id number]  HPI Has progressive arthritis, probable DJD type. Worst pain is in her back and is limiting her mobility Her recent back xr showed DDD, spondylosis and listhesis, collapsed vertebrae and scoliosis. Many other joints ache also. Counseled on the hazards of NSAID over use, safer use. Shoulders also are worse. Hx of recent UTIs and does have frequency now.No fever, nite sweats, weight loss.   Review of Systems Dysuria, freq Asthma , allergy    Objective:   Physical Exam  Constitutional: She is oriented to person, place, and time. She appears well-developed and well-nourished. She appears distressed.  HENT:  Head: Normocephalic.  Eyes: EOM are normal. No scleral icterus.  Cardiovascular: Normal rate, regular rhythm and normal heart sounds.   Pulmonary/Chest: Effort normal and breath sounds normal.  Musculoskeletal: She exhibits tenderness.       Lumbar back: She exhibits decreased range of motion, tenderness, bony tenderness, pain and spasm. She exhibits no swelling, no edema, no deformity and normal pulse.  Neurological: She is alert and oriented to person, place, and time. She exhibits normal muscle tone. Coordination normal.  Skin: No rash noted.  Psychiatric: She has a normal mood and affect. Her behavior is normal.     Results for orders placed in visit on 06/30/12  BASIC METABOLIC PANEL      Result Value Range   Sodium 141  135 - 145 mEq/L   Potassium 4.6  3.5 - 5.3 mEq/L   Chloride 103  96 - 112 mEq/L   CO2 28  19 - 32 mEq/L   Glucose, Bld 78  70 - 99 mg/dL   BUN 22  6 - 23 mg/dL   Creat 4.09  8.11 - 9.14 mg/dL   Calcium 9.7  8.4 - 78.2 mg/dL   Sed rate Results for orders placed in visit on 09/17/12  POCT CBC      Result Value Range   WBC 12.2 (*) 4.6 - 10.2 K/uL   Lymph, poc 3.3  0.6 - 3.4   POC LYMPH PERCENT 26.8  10 - 50 %L   MID (cbc) 1.0 (*) 0 - 0.9   POC MID % 7.9  0 - 12 %M   POC Granulocyte 8.0 (*) 2 - 6.9   Granulocyte percent 65.3  37 - 80 %G   RBC 4.99  4.04 - 5.48 M/uL   Hemoglobin 14.4  12.2 - 16.2 g/dL   HCT, POC 95.6  21.3 - 47.9 %   MCV 91.5  80 - 97 fL   MCH, POC 28.9  27 - 31.2 pg   MCHC 31.5 (*) 31.8 - 35.4 g/dL   RDW, POC 08.6     Platelet Count, POC 278  142 - 424 K/uL   MPV 9.9  0 - 99.8 fL  POCT URINALYSIS DIPSTICK      Result Value Range   Color, UA yellow     Clarity, UA hazy     Glucose, UA neg     Bilirubin, UA neg     Ketones, UA neg     Spec Grav, UA 1.010     Blood, UA tr-intact     pH, UA 5.5     Protein, UA neg     Urobilinogen, UA 0.2     Nitrite, UA neg     Leukocytes, UA large (3+)    POCT UA -  MICROSCOPIC ONLY      Result Value Range   WBC, Ur, HPF, POC 20-tntc     RBC, urine, microscopic 1-3     Bacteria, U Microscopic 1+     Mucus, UA neg     Epithelial cells, urine per micros 3-8     Crystals, Ur, HPF, POC neg     Casts, Ur, LPF, POC neg     Yeast, UA neg         Assessment & Plan:  Back pain/DDD/Spondylosis Refer to Dr. Regino Schultze spine spec. UTI

## 2012-09-18 LAB — URINE CULTURE

## 2012-10-05 DIAGNOSIS — M549 Dorsalgia, unspecified: Secondary | ICD-10-CM | POA: Diagnosis not present

## 2012-10-28 ENCOUNTER — Other Ambulatory Visit: Payer: Self-pay | Admitting: Family Medicine

## 2012-10-28 DIAGNOSIS — N952 Postmenopausal atrophic vaginitis: Secondary | ICD-10-CM

## 2012-10-28 NOTE — Telephone Encounter (Signed)
Dr Patsy Lager, you have seen this pt fairly recently but don't see that you discussed this medication at [facility]. Do you want to give pt RFs?

## 2012-11-17 ENCOUNTER — Other Ambulatory Visit: Payer: Self-pay

## 2012-11-27 ENCOUNTER — Other Ambulatory Visit: Payer: Self-pay | Admitting: Family Medicine

## 2012-12-06 ENCOUNTER — Other Ambulatory Visit: Payer: Self-pay | Admitting: Family Medicine

## 2012-12-24 ENCOUNTER — Other Ambulatory Visit: Payer: Self-pay | Admitting: Family Medicine

## 2013-01-31 ENCOUNTER — Other Ambulatory Visit: Payer: Self-pay | Admitting: Internal Medicine

## 2013-02-04 ENCOUNTER — Emergency Department (HOSPITAL_COMMUNITY): Payer: Medicare Other

## 2013-02-04 ENCOUNTER — Inpatient Hospital Stay (HOSPITAL_COMMUNITY)
Admission: EM | Admit: 2013-02-04 | Discharge: 2013-02-06 | DRG: 308 | Disposition: A | Discharge status: Home / Self Care | Payer: Medicare Other | Attending: Family Medicine | Admitting: Family Medicine

## 2013-02-04 ENCOUNTER — Encounter (HOSPITAL_COMMUNITY): Payer: Self-pay | Admitting: Emergency Medicine

## 2013-02-04 ENCOUNTER — Ambulatory Visit (INDEPENDENT_AMBULATORY_CARE_PROVIDER_SITE_OTHER): Payer: Medicare Other | Admitting: Family Medicine

## 2013-02-04 ENCOUNTER — Ambulatory Visit: Payer: Medicare Other

## 2013-02-04 VITALS — BP 140/68 | HR 107 | Temp 97.8°F | Resp 24 | Ht 60.75 in | Wt 181.0 lb

## 2013-02-04 DIAGNOSIS — Z66 Do not resuscitate: Secondary | ICD-10-CM | POA: Diagnosis present

## 2013-02-04 DIAGNOSIS — R300 Dysuria: Secondary | ICD-10-CM | POA: Diagnosis present

## 2013-02-04 DIAGNOSIS — M545 Low back pain, unspecified: Secondary | ICD-10-CM | POA: Diagnosis present

## 2013-02-04 DIAGNOSIS — M479 Spondylosis, unspecified: Secondary | ICD-10-CM

## 2013-02-04 DIAGNOSIS — R0602 Shortness of breath: Secondary | ICD-10-CM | POA: Diagnosis present

## 2013-02-04 DIAGNOSIS — I4949 Other premature depolarization: Secondary | ICD-10-CM

## 2013-02-04 DIAGNOSIS — R0609 Other forms of dyspnea: Secondary | ICD-10-CM

## 2013-02-04 DIAGNOSIS — I480 Paroxysmal atrial fibrillation: Secondary | ICD-10-CM | POA: Diagnosis present

## 2013-02-04 DIAGNOSIS — M129 Arthropathy, unspecified: Secondary | ICD-10-CM | POA: Diagnosis present

## 2013-02-04 DIAGNOSIS — M47819 Spondylosis without myelopathy or radiculopathy, site unspecified: Secondary | ICD-10-CM

## 2013-02-04 DIAGNOSIS — Z87891 Personal history of nicotine dependence: Secondary | ICD-10-CM | POA: Diagnosis not present

## 2013-02-04 DIAGNOSIS — L989 Disorder of the skin and subcutaneous tissue, unspecified: Secondary | ICD-10-CM

## 2013-02-04 DIAGNOSIS — I493 Ventricular premature depolarization: Secondary | ICD-10-CM

## 2013-02-04 DIAGNOSIS — J45909 Unspecified asthma, uncomplicated: Secondary | ICD-10-CM

## 2013-02-04 DIAGNOSIS — I4891 Unspecified atrial fibrillation: Secondary | ICD-10-CM | POA: Diagnosis not present

## 2013-02-04 DIAGNOSIS — I517 Cardiomegaly: Secondary | ICD-10-CM | POA: Diagnosis not present

## 2013-02-04 DIAGNOSIS — R7989 Other specified abnormal findings of blood chemistry: Secondary | ICD-10-CM | POA: Diagnosis present

## 2013-02-04 DIAGNOSIS — G8929 Other chronic pain: Secondary | ICD-10-CM | POA: Diagnosis present

## 2013-02-04 DIAGNOSIS — I5033 Acute on chronic diastolic (congestive) heart failure: Secondary | ICD-10-CM | POA: Diagnosis not present

## 2013-02-04 DIAGNOSIS — I319 Disease of pericardium, unspecified: Secondary | ICD-10-CM | POA: Diagnosis not present

## 2013-02-04 DIAGNOSIS — R0989 Other specified symptoms and signs involving the circulatory and respiratory systems: Secondary | ICD-10-CM

## 2013-02-04 DIAGNOSIS — J45901 Unspecified asthma with (acute) exacerbation: Secondary | ICD-10-CM | POA: Diagnosis present

## 2013-02-04 DIAGNOSIS — R0600 Dyspnea, unspecified: Secondary | ICD-10-CM

## 2013-02-04 DIAGNOSIS — I509 Heart failure, unspecified: Secondary | ICD-10-CM | POA: Diagnosis not present

## 2013-02-04 HISTORY — DX: Unspecified atrial fibrillation: I48.91

## 2013-02-04 HISTORY — DX: Heart failure, unspecified: I50.9

## 2013-02-04 LAB — URINALYSIS, ROUTINE W REFLEX MICROSCOPIC
Bilirubin Urine: NEGATIVE
GLUCOSE, UA: NEGATIVE mg/dL
HGB URINE DIPSTICK: NEGATIVE
Ketones, ur: NEGATIVE mg/dL
Nitrite: NEGATIVE
PROTEIN: NEGATIVE mg/dL
Specific Gravity, Urine: 1.021 (ref 1.005–1.030)
Urobilinogen, UA: 0.2 mg/dL (ref 0.0–1.0)
pH: 5.5 (ref 5.0–8.0)

## 2013-02-04 LAB — CBC
HEMATOCRIT: 41.9 % (ref 36.0–46.0)
HEMOGLOBIN: 14.2 g/dL (ref 12.0–15.0)
MCH: 28.7 pg (ref 26.0–34.0)
MCHC: 33.9 g/dL (ref 30.0–36.0)
MCV: 84.6 fL (ref 78.0–100.0)
Platelets: 282 10*3/uL (ref 150–400)
RBC: 4.95 MIL/uL (ref 3.87–5.11)
RDW: 14.9 % (ref 11.5–15.5)
WBC: 9.5 10*3/uL (ref 4.0–10.5)

## 2013-02-04 LAB — COMPREHENSIVE METABOLIC PANEL
ALT: 22 U/L (ref 0–35)
AST: 25 U/L (ref 0–37)
Albumin: 3.6 g/dL (ref 3.5–5.2)
Alkaline Phosphatase: 108 U/L (ref 39–117)
BILIRUBIN TOTAL: 0.4 mg/dL (ref 0.3–1.2)
BUN: 22 mg/dL (ref 6–23)
CHLORIDE: 102 meq/L (ref 96–112)
CO2: 24 meq/L (ref 19–32)
CREATININE: 1.15 mg/dL — AB (ref 0.50–1.10)
Calcium: 9.2 mg/dL (ref 8.4–10.5)
GFR calc non Af Amer: 42 mL/min — ABNORMAL LOW (ref >90)
GFR, EST AFRICAN AMERICAN: 48 mL/min — AB (ref >90)
Glucose, Bld: 98 mg/dL (ref 70–99)
Potassium: 4.7 mEq/L (ref 3.7–5.3)
SODIUM: 141 meq/L (ref 137–147)
Total Protein: 7.5 g/dL (ref 6.0–8.3)

## 2013-02-04 LAB — POCT I-STAT TROPONIN I: Troponin i, poc: 0 ng/mL (ref 0.00–0.08)

## 2013-02-04 LAB — HEPARIN LEVEL (UNFRACTIONATED): Heparin Unfractionated: 0.72 IU/mL — ABNORMAL HIGH (ref 0.30–0.70)

## 2013-02-04 LAB — BASIC METABOLIC PANEL
BUN: 20 mg/dL (ref 6–23)
CHLORIDE: 105 meq/L (ref 96–112)
CO2: 24 mEq/L (ref 19–32)
Calcium: 9 mg/dL (ref 8.4–10.5)
Creat: 1.17 mg/dL — ABNORMAL HIGH (ref 0.50–1.10)
Glucose, Bld: 103 mg/dL — ABNORMAL HIGH (ref 70–99)
Potassium: 4.6 mEq/L (ref 3.5–5.3)
Sodium: 138 mEq/L (ref 135–145)

## 2013-02-04 LAB — TSH: TSH: 1.948 u[IU]/mL (ref 0.350–4.500)

## 2013-02-04 LAB — MRSA PCR SCREENING: MRSA by PCR: NEGATIVE

## 2013-02-04 LAB — URINE MICROSCOPIC-ADD ON

## 2013-02-04 LAB — D-DIMER, QUANTITATIVE: D-Dimer, Quant: 1.45 ug/mL-FEU — ABNORMAL HIGH (ref 0.00–0.48)

## 2013-02-04 LAB — PRO B NATRIURETIC PEPTIDE: PRO B NATRI PEPTIDE: 696.3 pg/mL — AB (ref 0–450)

## 2013-02-04 MED ORDER — ONDANSETRON HCL 4 MG/2ML IJ SOLN
4.0000 mg | Freq: Three times a day (TID) | INTRAMUSCULAR | Status: AC | PRN
Start: 2013-02-04 — End: 2013-02-05

## 2013-02-04 MED ORDER — ALBUTEROL (5 MG/ML) CONTINUOUS INHALATION SOLN
10.0000 mg/h | INHALATION_SOLUTION | Freq: Once | RESPIRATORY_TRACT | Status: AC
Start: 2013-02-04 — End: 2013-02-04
  Administered 2013-02-04: 10 mg/h via RESPIRATORY_TRACT
  Filled 2013-02-04: qty 20

## 2013-02-04 MED ORDER — DILTIAZEM HCL 100 MG IV SOLR
5.0000 mg/h | INTRAVENOUS | Status: DC
  Administered 2013-02-04: 5 mg/h via INTRAVENOUS
  Filled 2013-02-04 (×2): qty 100

## 2013-02-04 MED ORDER — IPRATROPIUM BROMIDE 0.02 % IN SOLN
0.5000 mg | Freq: Once | RESPIRATORY_TRACT | Status: AC
  Administered 2013-02-04: 0.5 mg via RESPIRATORY_TRACT
  Filled 2013-02-04: qty 2.5

## 2013-02-04 MED ORDER — HEPARIN BOLUS VIA INFUSION
3500.0000 [IU] | Freq: Once | INTRAVENOUS | Status: AC
  Administered 2013-02-04: 3500 [IU] via INTRAVENOUS
  Filled 2013-02-04: qty 3500

## 2013-02-04 MED ORDER — LEVALBUTEROL HCL 0.63 MG/3ML IN NEBU
0.6300 mg | INHALATION_SOLUTION | Freq: Four times a day (QID) | RESPIRATORY_TRACT | Status: DC
  Administered 2013-02-04 – 2013-02-05 (×3): 0.63 mg via RESPIRATORY_TRACT
  Filled 2013-02-04 (×7): qty 3

## 2013-02-04 MED ORDER — LEVALBUTEROL HCL 0.63 MG/3ML IN NEBU
0.6300 mg | INHALATION_SOLUTION | Freq: Four times a day (QID) | RESPIRATORY_TRACT | Status: DC
  Filled 2013-02-04 (×3): qty 3

## 2013-02-04 MED ORDER — ACETAMINOPHEN 325 MG PO TABS: 650.0000 mg | ORAL_TABLET | Freq: Four times a day (QID) | ORAL | Status: DC

## 2013-02-04 MED ORDER — TRAMADOL HCL 50 MG PO TABS: 50.0000 mg | ORAL_TABLET | Freq: Four times a day (QID) | ORAL | Status: DC | PRN

## 2013-02-04 MED ORDER — SODIUM CHLORIDE 0.9 % IV SOLN: INTRAVENOUS | Status: DC

## 2013-02-04 MED ORDER — HYDROCODONE-ACETAMINOPHEN 5-325 MG PO TABS
1.0000 | ORAL_TABLET | ORAL | Status: DC | PRN
  Administered 2013-02-05: 1 via ORAL
  Filled 2013-02-04: qty 1

## 2013-02-04 MED ORDER — METHYLPREDNISOLONE SODIUM SUCC 125 MG IJ SOLR
125.0000 mg | Freq: Once | INTRAMUSCULAR | Status: AC
  Administered 2013-02-04: 125 mg via INTRAVENOUS
  Filled 2013-02-04: qty 2

## 2013-02-04 MED ORDER — ESTROGENS, CONJUGATED 0.625 MG/GM VA CREA: 1.0000 | TOPICAL_CREAM | Freq: Every day | VAGINAL | Status: DC

## 2013-02-04 MED ORDER — TRIAMCINOLONE ACETONIDE 0.1 % EX CREA: 1.0000 "application " | TOPICAL_CREAM | Freq: Two times a day (BID) | CUTANEOUS | Status: DC

## 2013-02-04 MED ORDER — MONTELUKAST SODIUM 10 MG PO TABS
10.0000 mg | ORAL_TABLET | Freq: Every day | ORAL | Status: DC
  Administered 2013-02-04 – 2013-02-05 (×2): 10 mg via ORAL
  Filled 2013-02-04 (×3): qty 1

## 2013-02-04 MED ORDER — ALBUTEROL SULFATE (2.5 MG/3ML) 0.083% IN NEBU
5.0000 mg | INHALATION_SOLUTION | Freq: Once | RESPIRATORY_TRACT | Status: AC
  Administered 2013-02-04: 5 mg via RESPIRATORY_TRACT
  Filled 2013-02-04: qty 6

## 2013-02-04 MED ORDER — HEPARIN (PORCINE) IN NACL 100-0.45 UNIT/ML-% IJ SOLN
900.0000 [IU]/h | INTRAMUSCULAR | Status: DC
  Administered 2013-02-04: 1000 [IU]/h via INTRAVENOUS
  Filled 2013-02-04 (×3): qty 250

## 2013-02-04 MED ORDER — IOHEXOL 350 MG/ML SOLN
80.0000 mL | Freq: Once | INTRAVENOUS | Status: AC | PRN
  Administered 2013-02-04: 80 mL via INTRAVENOUS

## 2013-02-04 MED ORDER — MOMETASONE FURO-FORMOTEROL FUM 100-5 MCG/ACT IN AERO
2.0000 | INHALATION_SPRAY | Freq: Two times a day (BID) | RESPIRATORY_TRACT | Status: DC
  Administered 2013-02-04 – 2013-02-06 (×4): 2 via RESPIRATORY_TRACT
  Filled 2013-02-04: qty 8.8

## 2013-02-04 MED ORDER — FUROSEMIDE 10 MG/ML IJ SOLN
20.0000 mg | Freq: Once | INTRAMUSCULAR | Status: AC
  Administered 2013-02-04: 20 mg via INTRAVENOUS
  Filled 2013-02-04: qty 2

## 2013-02-04 NOTE — ED Notes (Signed)
Pt has been under cardiologist care for chf.  Pt reports increased coughing and shortness of breath with fatigue.  Pt was found to be in afib at MD office and sent here to see Dr. [name] and to call on arrival.

## 2013-02-04 NOTE — Progress Notes (Signed)
Subjective: 80 year old lady who is here complaining of shortness of breath. She apparently took her shower this morning and afterwards felt extremely short of breath. This is a fairly typical pattern for her. It has been gradually getting worse. The main reason she came on in today was because she had to get a new prescription. Is been getting worse the last few days especially but bothering her for over a week. Denies any chest pain. She has a history of frequent PVCs in the past. She also has a sore place on her right side of the chest wall in the posterior axillary line that apparently has been irritating her for some time. She uses Advair twice daily, when necessary albuterol, takes diltiazem, takes Singulair and meloxicam. Her regular physician usually is Dr. Edilia Bo, last was seen here by Dr. Elder Cyphers several months ago. She hasn't smoked for many years. She has a cardiologist who she sees occasionally, last time about 9 months ago.  Objective: Pleasant alert elderly lady in no major distress. Says she cannot lay back all the way flat. Her throat is clear. Neck supple without nodes thyromegaly. No JVD. Chest clear to auscultation. Heart he has irregular with frequent ectopy and a rate of around 120. Abdomen soft without masses or tenderness. No ankle edema. On right lateral chest wall in the posterior axillary line the almost 2 sodium erythematous area she is under a Band-Aid. I do not see any wartlike projections the that's what she asked about.  Assessment: Dyspnea Tachycardia and irregular heartbeat, rule out afib History of asthma Erythematous skin lesion right lateral chest wall  Plan: EKG, chest x-ray and labs The skin lesions that appear to be of major concern acutely, but probably should come in sometime in the future for a punch biopsy  [facility] reading (PRIMARY) by  Dr. Linna Darner No change from 9/14.  Borderline cardiomegaly, old scarring of lungs, suspicious for COPD. No frank failure  noted.  EKG reveals nature fibrillation at a rate of 115  Will call whoever is on call for her cardiologist.  Assessment: Atrial fibrillation, recent onset Dyspnea Tachycardia Hx asthma  Plan: Splke with cardiologist on call Dr. Radford Pax.  Will send to [facility] ER by automobile since patient is comfortable at rest and the symptoms have been going on for one week. Daughter will take her directly to the hospital. I told the patient she could not stop at a restaurant for breakfast. They understand that. Charge nurse will be notified

## 2013-02-04 NOTE — Progress Notes (Signed)
ANTICOAGULATION CONSULT NOTE  Pharmacy Consult for heparin Indication: Atrial fibrillation  No Known Allergies  Patient Measurements: Height: 5\' 3"  (160 cm) Weight: 180 lb 12.4 oz (82 kg) IBW/kg (Calculated) : 52.4 Heparin Dosing Weight: 66kg  Vital Signs: Temp: 97.9 F (36.6 C) (01/24 2353) Temp src: Oral (01/24 2353) BP: 143/59 mmHg (01/24 1600) Pulse Rate: 106 (01/24 1600)  Labs:  Recent Labs  02/04/13 0905 02/04/13 1042 02/04/13 2245  HGB  --  14.2  --   HCT  --  41.9  --   PLT  --  282  --   HEPARINUNFRC  --   --  0.72*  CREATININE 1.17* 1.15*  --     Estimated Creatinine Clearance: 35.6 ml/min (by C-G formula based on Cr of 1.15).  Assessment: 80 yo female with AFib for heparin  Chest CT negative for PE.  Goal of Therapy:  Heparin level 0.3-0.7 units/ml Monitor platelets by anticoagulation protocol: Yes   Plan:  Decrease heparin 900 units/hr Follow-up am labs.  Caryl Pina 02/04/2013,11:59 PM

## 2013-02-04 NOTE — ED Provider Notes (Signed)
CSN: [id number]     Arrival date & time 02/04/13  1028 History   First MD Initiated Contact with Patient 02/04/13 1056     Chief Complaint  Patient presents with  . Shortness of Breath   (Consider location/radiation/quality/duration/timing/severity/associated sxs/prior Treatment) HPI Comments: Patient presented to urgent care for refill of her inhalers for asthma. She says she's had worsening shortness of breath for the past several days. She has dry cough with dyspnea on exertion and difficulty lying flat. She was found to be in atrial fibrillation which she has not had before. She denies any chest pain or fever. She denies any previous history of CAD. She does have a history of CHF and frequent PVCs.  The history is provided by the patient.    Past Medical History  Diagnosis Date  . Asthma   . PVC's (premature ventricular contractions)   . Murmur, cardiac   . Arthritis   . CHF (congestive heart failure)   . Atrial fibrillation    Past Surgical History  Procedure Laterality Date  . Appendectomy    . Cholecystectomy    . Tonsillectomy    . Oophrectomy     History reviewed. No pertinent family history. History  Substance Use Topics  . Smoking status: Former Smoker    Quit date: 04/13/1981  . Smokeless tobacco: Never Used  . Alcohol Use: No   OB History   Grav Para Term Preterm Abortions TAB SAB Ect Mult Living                 Review of Systems  Constitutional: Negative for fever, activity change and appetite change.  Respiratory: Positive for cough and shortness of breath. Negative for chest tightness.   Cardiovascular: Negative for chest pain.  Gastrointestinal: Negative for nausea, vomiting and abdominal pain.  Genitourinary: Negative for dysuria, hematuria, vaginal bleeding and vaginal discharge.  Musculoskeletal: Negative for back pain.  Skin: Negative for pallor.  Neurological: Negative for dizziness, weakness and headaches.  A complete 10 system review of  systems was obtained and all systems are negative except as noted in the HPI and PMH.    Allergies  Review of patient's allergies indicates no known allergies.  Home Medications   No current outpatient prescriptions on file. BP 153/56  Pulse 119  Temp(Src) 98.4 F (36.9 C) (Oral)  Resp 26  Ht 5\' 3"  (1.6 m)  Wt 180 lb 12.4 oz (82 kg)  BMI 32.03 kg/m2  SpO2 98% Physical Exam  Constitutional: She is oriented to person, place, and time. She appears well-developed and well-nourished. No distress.  HENT:  Head: Normocephalic and atraumatic.  Mouth/Throat: Oropharynx is clear and moist. No oropharyngeal exudate.  Eyes: Conjunctivae and EOM are normal. Pupils are equal, round, and reactive to light.  Neck: Normal range of motion. Neck supple.  Cardiovascular: Normal rate and normal heart sounds.   Irregular rhythm  Pulmonary/Chest: Effort normal. She has wheezes.  Scattered expiratory wheezing  Abdominal: Soft. There is no tenderness. There is no rebound and no guarding.  Musculoskeletal: Normal range of motion. She exhibits no edema and no tenderness.  Neurological: She is alert and oriented to person, place, and time. No cranial nerve deficit. She exhibits normal muscle tone. Coordination normal.  Skin: Skin is warm.    ED Course  Procedures (including critical care time) Labs Review Labs Reviewed  PRO B NATRIURETIC PEPTIDE - Abnormal; Notable for the following:    Pro B Natriuretic peptide (BNP) 696.3 (*)  All other components within normal limits  COMPREHENSIVE METABOLIC PANEL - Abnormal; Notable for the following:    Creatinine, Ser 1.15 (*)    GFR calc non Af Amer 42 (*)    GFR calc Af Amer 48 (*)    All other components within normal limits  D-DIMER, QUANTITATIVE - Abnormal; Notable for the following:    D-Dimer, Quant 1.45 (*)    All other components within normal limits  MRSA PCR SCREENING  CBC  TSH  HEPARIN LEVEL (UNFRACTIONATED)  URINALYSIS, ROUTINE W REFLEX  MICROSCOPIC  POCT I-STAT TROPONIN I   Imaging Review Dg Chest 2 View  02/04/2013   CLINICAL DATA:  Shortness of breath  EXAM: CHEST  2 VIEW  COMPARISON:  06/30/2012  FINDINGS: Chronically prominent interstitial markings and hyperinflation likely indicate emphysema, unchanged. No new focal pulmonary opacity. No pleural effusion. No acute osseous finding. Mild cardiomegaly again noted.  IMPRESSION: No acute focal finding.  Emphysematous changes are reidentified.   Electronically Signed   By: Conchita Paris M.D.   On: 02/04/2013 14:19   Dg Chest 2 View  02/04/2013   CLINICAL DATA:  Shortness of breath.  Fatigue.  Asthma.  EXAM: CHEST  2 VIEW  COMPARISON:  02/04/2013  FINDINGS: Heart size remains within normal limits. Increased mixed interstitial and airspace opacity is seen in the upper lobes bilaterally which is symmetric, and may be due to pneumonia, aspiration, or less likely edema. No evidence of pulmonary consolidation or pleural effusion. No mass or lymphadenopathy identified.  IMPRESSION: New symmetric mixed interstitial and airspace opacity in the upper lobes. Differential diagnosis includes multilobar pneumonia, aspiration, or less likely edema.   Electronically Signed   By: Earle Gell M.D.   On: 02/04/2013 13:15   Ct Angio Chest Pe W/cm &/or Wo Cm  02/04/2013   CLINICAL DATA:  Shortness of breath.  Elevated D-dimer.  EXAM: CT ANGIOGRAPHY CHEST WITH CONTRAST  TECHNIQUE: Multidetector CT imaging of the chest was performed using the standard protocol during bolus administration of intravenous contrast. Multiplanar CT image reconstructions including MIPs were obtained to evaluate the vascular anatomy.  CONTRAST:  94mL OMNIPAQUE IOHEXOL 350 MG/ML SOLN  COMPARISON:  Chest x-ray 02/04/2013.  FINDINGS: Atherosclerotic vascular changes in the thoracic aorta. No aneurysm or dissection. Great vessels are unremarkable. Pulmonary arteries are normal. No pulmonary embolus. Cardiomegaly. Small pericardial  effusion.  Shotty nonspecific mediastinal lymph nodes are noted. No pathologic adenopathy noted using CT size criteria. Thoracic esophagus is unremarkable. Small sliding hiatal hernia is present. Adrenals are normal. Upper abdominal viscera unremarkable where visualized.  Large airways are patent. Diffuse bilateral pulmonary interstitial prominence noted. This can be seen with interstitial pneumonitis. Congestive heart failure and interstitial edema can also present in this fashion. No significant pleural effusion. No pneumothorax.  Chest wall is intact. Thyroid is normal. Supraclavicular and axillary regions are normal. Diffuse degenerative change thoracic spine with spinal scoliosis concave left present.  Review of the MIP images confirms the above findings.  IMPRESSION: 1. Cardiomegaly with bilateral diffuse pulmonary interstitial prominence suggesting congestive heart failure with interstitial edema. Pneumonitis cannot be excluded. 2. Small pericardial effusion .   Electronically Signed   By: Marcello Moores  Register   On: 02/04/2013 13:39    EKG Interpretation    Date/Time:  Saturday February 04 2013 10:31:29 EST Ventricular Rate:  113 PR Interval:    QRS Duration: 66 QT Interval:  326 QTC Calculation: 447 R Axis:   -40 Text Interpretation:  Atrial fibrillation with  rapid ventricular response Left axis deviation Abnormal ECG No previous ECGs available Confirmed by Carleah Yablonski  MD, Eisa Necaise ([id number]) on 02/04/2013 11:26:13 AM            MDM   1. Atrial fibrillation with RVR   2. Acute on chronic diastolic congestive heart failure   3. PVC's (premature ventricular contractions)   4. Atrial fibrillation   5. Asthma    Shortness of breath with cough and wheezing history of asthma. Found to be in atrial fibrillation, new diagnosis.  Patient wheezing on exam. She is given nebulizers and steroids. Chest x-ray shows cardiomegaly with diffuse interstitial prominence.  Her shortness of breath is likely  multifactorial from her asthma and CHF from rapid A. fib. She's never had atrial fibrillation before. She denies any chest pain. Her rate is controlled in the 100s.  Rate controlled atrial fibrillation discussed with Dr. Radford Pax cardiology. Heparin gtt started.  Cardizem started.  As patient is wheezing she requests medical admission.  CRITICAL CARE Performed by: Ezequiel Essex Total critical care time: 30 Critical care time was exclusive of separately billable procedures and treating other patients. Critical care was necessary to treat or prevent imminent or life-threatening deterioration. Critical care was time spent personally by me on the following activities: development of treatment plan with patient and/or surrogate as well as nursing, discussions with consultants, evaluation of patient's response to treatment, examination of patient, obtaining history from patient or surrogate, ordering and performing treatments and interventions, ordering and review of laboratory studies, ordering and review of radiographic studies, pulse oximetry and re-evaluation of patient's condition.   Ezequiel Essex, MD 02/04/13 [phone]

## 2013-02-04 NOTE — Consult Note (Addendum)
Admit date: 02/04/2013 Referring Physician  Dr. Wyvonnia Dusky Primary Physician  Dr. Janett Billow Copland Primary Cardiologist  Dr. Shepard General Reason for Consultation  New onset atrial fibrillation with RVR  HPI: This is an 80yo female with a history of asthma and PVC's that was evaluated by Dr. Fletcher Anon in 2013 but no history of afib who presented to her PCP's office complaining of SOB.  She took a shower this am and afterwards felt very SOB.  She gets this frequently with her asthma.  Her SOB has been getting worse.  She went to see her PCP because she needed a new prescription.  She denies any chest pain but has been feeling weak for several days.  She uses Advair and albuterol PRN.  SHe is on PO cardizem for suppression of PVC's.  On exam she was wheezing.  She was noted to be tachycardic and irregular and an EKG noted atrial fibrillation with RVR.  She was transferred by her PCP to ER.  In ER she was placed on an IV Cardizem gtt and HR is now in the 90's. Cardiology is now asked to consult for further treatment of afib.    PMH:   Past Medical History  Diagnosis Date  . Asthma   . PVC's (premature ventricular contractions)   . Murmur, cardiac   . Arthritis   . CHF (congestive heart failure)      PSH:   Past Surgical History  Procedure Laterality Date  . Appendectomy    . Cholecystectomy    . Tonsillectomy    . Oophrectomy      Allergies:  Review of patient's allergies indicates no known allergies. Prior to Admit Meds:   (Not in a hospital admission) Fam HX:   No family history on file. Social HX:    History   Social History  . Marital Status: Unknown    Spouse Name: N/A    Number of Children: N/A  . Years of Education: N/A   Occupational History  . Not on file.   Social History Main Topics  . Smoking status: Former Smoker    Quit date: 04/13/1981  . Smokeless tobacco: Never Used  . Alcohol Use: No  . Drug Use: No  . Sexual Activity: Not on file   Other Topics Concern  . Not on  file   Social History Narrative  . No narrative on file     ROS:  All 11 ROS were addressed and are negative except what is stated in the HPI  Physical Exam: Blood pressure 114/63, pulse 98, temperature 97.7 F (36.5 C), temperature source Oral, resp. rate 17, weight 183 lb 1 oz (83.037 kg), SpO2 95.00%.    General: Well developed, well nourished, in no acute distress Head: Eyes PERRLA, No xanthomas.   Normal cephalic and atramatic  Lungs:  Diffuse expiratory wheezes Heart:   Irregularly irregular and tachy S1 S2 Pulses are 2+ & equal.            No carotid bruit. No JVD.  No abdominal bruits. No femoral bruits. Abdomen: Bowel sounds are positive, abdomen soft and non-tender without masses  Extremities:   No clubbing, cyanosis or edema.  DP +1 Neuro: Alert and oriented X 3. Psych:  Good affect, responds appropriately    Labs:   Lab Results  Component Value Date   WBC 9.5 02/04/2013   HGB 14.2 02/04/2013   HCT 41.9 02/04/2013   MCV 84.6 02/04/2013   PLT 282 02/04/2013    Recent  Labs Lab 02/04/13 1042  NA 141  K 4.7  CL 102  CO2 24  BUN 22  CREATININE 1.15*  CALCIUM 9.2  PROT 7.5  BILITOT 0.4  ALKPHOS 108  ALT 22  AST 25  GLUCOSE 98   No results found for this basename: PTT   No results found for this basename: INR, PROTIME   No results found for this basename: CKTOTAL, CKMB, CKMBINDEX, TROPONINI     Lab Results  Component Value Date   CHOL 228* 04/22/2011   Lab Results  Component Value Date   HDL 50 04/22/2011   Lab Results  Component Value Date   LDLCALC 147* 04/22/2011   Lab Results  Component Value Date   TRIG 157* 04/22/2011   Lab Results  Component Value Date   CHOLHDL 4.6 04/22/2011   No results found for this basename: LDLDIRECT      Radiology:  No results found.  EKG:  Atrial fibrillation with RVR at 113bpm with LAD  ASSESSMENT:  1.  New onset atrial fibrillation of unknown duration in the setting of asthma exacerbation 2.  Acute SOB  most likely multifactorial from acute asthma exacerbation as well as possible acute diastolic CHF from rapid afib.  Chest xray pending. 2D echo 04/2011 with normal LVF and grade I diastolic dysfunction 3.  History of PVC's suppressed on Cardizem 4.  History of asthma with frequent exacerbations 5.  Acute on chronic diastolic CHF secondary to #1 6.  Elevated d-dimer  PLAN:   1.  Continue IV Cardizem gtt and titrate as needed for HR control 2.  IV Heparin gtt for now and consider change over to Apixaban long term.  Her CHADS VASC score is 2 (age>65 and female) 3.  Check 2D echo to reassess LVF 4.  Check TSH 5.  No beta blocker due to asthma 6.  Lasix 20mg  IV now 7.  Chest CT angio to rule out PE with elevated d-dimer and SOB  Sueanne Margarita, MD  02/04/2013  12:30 PM

## 2013-02-04 NOTE — ED Notes (Signed)
Returned from xray-- Dr. Radford Pax in room

## 2013-02-04 NOTE — H&P (Signed)
[facility] Admission History and Physical Service Pager: [phone]  Patient name: Crystal Kerr Medical record number: [id number] Date of birth: Jun 21, 1924 Age: 80 y.o. Gender: female  Primary Care Provider: Lamar Blinks, MD Consultants: Cardiology Code Status: DNR; Agreeable to Cardioversion if needed  Chief Complaint: SOB  Assessment and Plan: Crystal Kerr is a 80 y.o. female presenting with new onset Afib. PMH is significant for asthma, HFpEF, Arthritis, PVCs.   # New onset Afib - Already on Diltiazem for symptomatic PVCs. ECHO 04/2011: showed mild LVH w/ EF 55% - 60% - istat trop wnl; CTA showed: Cardiomegaly with bilateral diffuse pulmonary interstitial prominence suggesting congestive heart failure with interstitial edema. NO PE;  BNP 693 - Cardiology consulted  Cardizem drip start  Heparin infusion; consider change over to Apixaban long term  Lasix 20mg  IV x 1 - Check TSH, ECHO - Likely with component of CHF exacerbation secondary to a fib.  Will see how she responds to the lasix and redose as needed  # Asthma - SOB more likely related to new afib than asthma exacerbation; Oxygen saturation normal on RA- will not use steroids  - Xopenex q6 - Dulera  # Chronic back pain - Hold mobic - Continue Tylenol and ultram   # Dysuria - Likely related to vaginal atrophy but will get UA to evaluate for UTI - continue Premarin - UA pending  FEN/GI: Heart healthy diet; SLIV Prophylaxis: On therapeutic heparin  Disposition: Admit to stepdown for Cardizem drip; Attending [place]; Discharge pending Cardiology rec  History of Present Illness: Crystal Kerr is a 81 y.o. female presenting with SOB and fatigue x 1 weeks. She reported to Urgent care for refills of her asthma medications and was found to be in Afib. She reports palpitations over the past week, that her SOB "doesn't feel like her previous asthma exacerbations."  She denies any CP or LE edema, but does report orthopnea. She denies any fevers or URI symptoms, but reports dysuria (which she states is chronic and using premarin cream to treat).  Also with decreased energy over the last week or so.  Denies URI type symptoms and wheezing.  Review Of Systems: Per HPI with the following additions:  Otherwise 12 point review of systems was performed and was unremarkable.  Patient Active Problem List   Diagnosis Date Noted  . Atrial fibrillation with RVR 02/04/2013  . Acute on chronic diastolic congestive heart failure 02/04/2013  . Arthritis of spine 07/01/2012  . Intrinsic asthma 07/01/2012  . Dyspnea 04/12/2012  . PVC's (premature ventricular contractions) 04/24/2011  . Murmur, cardiac 04/24/2011   Past Medical History: Past Medical History  Diagnosis Date  . Asthma   . PVC's (premature ventricular contractions)   . Murmur, cardiac   . Arthritis   . CHF (congestive heart failure)   . Atrial fibrillation    Past Surgical History: Past Surgical History  Procedure Laterality Date  . Appendectomy    . Cholecystectomy    . Tonsillectomy    . Oophrectomy     Social History: History  Substance Use Topics  . Smoking status: Former Smoker    Quit date: 04/13/1981  . Smokeless tobacco: Never Used  . Alcohol Use: No   Additional social history:   Please also refer to relevant sections of EMR.  Family History: No family history on file. Allergies and Medications: No Known Allergies No current facility-administered medications on file prior to encounter.   Current Outpatient Prescriptions on File  Prior to Encounter  Medication Sig Dispense Refill  . albuterol (PROVENTIL HFA;VENTOLIN HFA) 108 (90 BASE) MCG/ACT inhaler Inhale 2 puffs into the lungs every 4 (four) hours as needed for wheezing (cough, shortness of breath or wheezing.).  1 Inhaler  1  . diltiazem (CARDIZEM CD) 120 MG 24 hr capsule TAKE 1 CAPSULE BY MOUTH DAILY  90 capsule  0  .  fish oil-omega-3 fatty acids 1000 MG capsule Take 2 g by mouth daily.      . Fluticasone-Salmeterol (ADVAIR DISKUS) 250-50 MCG/DOSE AEPB Inhale 1 puff into the lungs 2 (two) times daily. PATIENT NEEDS OFFICE VISIT FOR ADDITIONAL REFILLS  60 each  0  . glucosamine-chondroitin 500-400 MG tablet Take 1 tablet by mouth 3 (three) times daily.      . meloxicam (MOBIC) 7.5 MG tablet Take 7.5 mg by mouth daily.      . montelukast (SINGULAIR) 10 MG tablet TAKE 1 TABLET BY MOUTH DAILY  90 tablet  1  . Multiple Vitamin (MULTIVITAMIN) tablet Take 1 tablet by mouth daily.      Marland Kitchen PREMARIN vaginal cream START WITH 0.5 GRAM APPLLIED VAGINALLY DAILY TAPER DOWN TO THREE TIMES WEEKLY AS DIRECTED  30 g  1  . diltiazem (TIAZAC) 120 MG 24 hr capsule Take 1 capsule (120 mg total) by mouth daily.  90 capsule  2  . triamcinolone cream (KENALOG) 0.1 % Apply 1 application topically 2 (two) times daily.  15 g  0    Objective: BP 116/61  Pulse 102  Temp(Src) 97.7 F (36.5 C) (Oral)  Resp 23  Wt 183 lb 1 oz (83.037 kg)  SpO2 96% Exam: General: Pleasant elderly female; NAD HEENT: MMM Cardiovascular: irregular irregular rhythm with mild RVR; no m/r/g  Respiratory: Mild expiratory wheeze; no rhonchi; normal resp effort   Abdomen: SNTND Extremities: WWP, No LE edema, 2+ pulses; trace edema  Labs and Imaging: CBC BMET   Recent Labs Lab 02/04/13 1042  WBC 9.5  HGB 14.2  HCT 41.9  PLT 282    Recent Labs Lab 02/04/13 1042  NA 141  K 4.7  CL 102  CO2 24  BUN 22  CREATININE 1.15*  GLUCOSE 98  CALCIUM 9.2     BNP    Component Value Date/Time   PROBNP 696.3* 02/04/2013 1042   Troponin (Point of Care Test)  Recent Labs  02/04/13 1127  TROPIPOC 0.00   CTA 1/24 IMPRESSION:  1. Cardiomegaly with bilateral diffuse pulmonary interstitial  prominence suggesting congestive heart failure with interstitial  edema. Pneumonitis cannot be excluded.  2. Small pericardial effusion .  EKG: a fib with RVR;  left axis deviation; no ST or T-wave abnormalites  Phill Myron, MD 02/04/2013, 1:59 PM PGY-1, [place] Intern pager: [phone], text pages welcome  PGY-3 Addendum I have seen and examined this patient.  I agree with the above note with my edits in pink.  HONIG, [place] 02/04/2013, 4:31 PM

## 2013-02-04 NOTE — Patient Instructions (Addendum)
Return in near future to recheck skin lesion chest wall  Use triamcinolone cream on skin lesion for 2 weeks, then return if not much better.  Go directly to the [facility] emergency room.  Dr. Golden Hurter is on call for your cardiologist and knows you are coming.

## 2013-02-04 NOTE — Progress Notes (Signed)
ANTICOAGULATION CONSULT NOTE - Initial Consult  Pharmacy Consult for heparin Indication: atrial fibrillation and r/o PE  No Known Allergies  Patient Measurements: Weight: 183 lb 1 oz (83.037 kg) Heparin Dosing Weight: 66kg  Vital Signs: Temp: 97.7 F (36.5 C) (01/24 1037) Temp src: Oral (01/24 1037) BP: 116/61 mmHg (01/24 1215) Pulse Rate: 102 (01/24 1215)  Labs:  Recent Labs  02/04/13 1042  HGB 14.2  HCT 41.9  PLT 282  CREATININE 1.15*    The CrCl is unknown because both a height and weight (above a minimum accepted value) are required for this calculation.   Medical History: Past Medical History  Diagnosis Date  . Asthma   . PVC's (premature ventricular contractions)   . Murmur, cardiac   . Arthritis   . CHF (congestive heart failure)   . Atrial fibrillation     Medications:  Infusions:  . diltiazem (CARDIZEM) infusion    . heparin    . heparin      Assessment: 75 yof presented to the ED with afib w/ RVR. Of note, her D-dimer is elevated so a CT has been ordered to r/o PE. To start IV heparin for anticoagulation for now. Baseline CBC is WNL. She is not on any anticoagulation PTA.   Goal of Therapy:  Heparin level 0.3-0.7 units/ml Monitor platelets by anticoagulation protocol: Yes   Plan:  1. Heparin bolus 3500 units IV x 1 2. Heparin gtt 1000 units/hr 3. Check an 8 hour heparin level 4. Daily heparin level and CBC 5. F/u transition to oral anticoag 6. F/u results of CT scan  Oscar Forman, Rande Lawman 02/04/2013,1:11 PM

## 2013-02-05 DIAGNOSIS — R0609 Other forms of dyspnea: Secondary | ICD-10-CM | POA: Diagnosis not present

## 2013-02-05 DIAGNOSIS — I5033 Acute on chronic diastolic (congestive) heart failure: Secondary | ICD-10-CM | POA: Diagnosis not present

## 2013-02-05 DIAGNOSIS — J45909 Unspecified asthma, uncomplicated: Secondary | ICD-10-CM | POA: Diagnosis not present

## 2013-02-05 DIAGNOSIS — I517 Cardiomegaly: Secondary | ICD-10-CM

## 2013-02-05 DIAGNOSIS — M479 Spondylosis, unspecified: Secondary | ICD-10-CM | POA: Diagnosis not present

## 2013-02-05 DIAGNOSIS — I509 Heart failure, unspecified: Secondary | ICD-10-CM | POA: Diagnosis not present

## 2013-02-05 DIAGNOSIS — I4891 Unspecified atrial fibrillation: Secondary | ICD-10-CM | POA: Diagnosis not present

## 2013-02-05 LAB — BASIC METABOLIC PANEL
BUN: 27 mg/dL — AB (ref 6–23)
CALCIUM: 9.4 mg/dL (ref 8.4–10.5)
CO2: 21 mEq/L (ref 19–32)
Chloride: 96 mEq/L (ref 96–112)
Creatinine, Ser: 1.17 mg/dL — ABNORMAL HIGH (ref 0.50–1.10)
GFR calc Af Amer: 47 mL/min — ABNORMAL LOW (ref >90)
GFR calc non Af Amer: 41 mL/min — ABNORMAL LOW (ref >90)
GLUCOSE: 169 mg/dL — AB (ref 70–99)
Potassium: 4.2 mEq/L (ref 3.7–5.3)
Sodium: 136 mEq/L — ABNORMAL LOW (ref 137–147)

## 2013-02-05 LAB — CBC
HEMATOCRIT: 38.3 % (ref 36.0–46.0)
HEMOGLOBIN: 13 g/dL (ref 12.0–15.0)
MCH: 28.2 pg (ref 26.0–34.0)
MCHC: 33.9 g/dL (ref 30.0–36.0)
MCV: 83.1 fL (ref 78.0–100.0)
Platelets: 261 10*3/uL (ref 150–400)
RBC: 4.61 MIL/uL (ref 3.87–5.11)
RDW: 15 % (ref 11.5–15.5)
WBC: 11 10*3/uL — ABNORMAL HIGH (ref 4.0–10.5)

## 2013-02-05 LAB — HEPARIN LEVEL (UNFRACTIONATED): Heparin Unfractionated: 0.65 IU/mL (ref 0.30–0.70)

## 2013-02-05 MED ORDER — CEPHALEXIN 500 MG PO CAPS
500.0000 mg | ORAL_CAPSULE | Freq: Three times a day (TID) | ORAL | Status: DC
  Administered 2013-02-05 – 2013-02-06 (×4): 500 mg via ORAL
  Filled 2013-02-05 (×7): qty 1

## 2013-02-05 MED ORDER — TRAMADOL HCL 50 MG PO TABS: 50.0000 mg | ORAL_TABLET | Freq: Four times a day (QID) | ORAL | Status: DC | PRN

## 2013-02-05 MED ORDER — APIXABAN 5 MG PO TABS
5.0000 mg | ORAL_TABLET | Freq: Two times a day (BID) | ORAL | Status: DC
  Administered 2013-02-05 – 2013-02-06 (×3): 5 mg via ORAL
  Filled 2013-02-05 (×4): qty 1

## 2013-02-05 MED ORDER — DILTIAZEM HCL ER COATED BEADS 180 MG PO CP24
180.0000 mg | ORAL_CAPSULE | Freq: Every day | ORAL | Status: DC
  Administered 2013-02-05 – 2013-02-06 (×2): 180 mg via ORAL
  Filled 2013-02-05 (×2): qty 1

## 2013-02-05 MED ORDER — LEVALBUTEROL HCL 0.63 MG/3ML IN NEBU
0.6300 mg | INHALATION_SOLUTION | Freq: Two times a day (BID) | RESPIRATORY_TRACT | Status: DC
  Administered 2013-02-05: 0.63 mg via RESPIRATORY_TRACT
  Filled 2013-02-05 (×2): qty 3

## 2013-02-05 MED ORDER — ALBUTEROL SULFATE (2.5 MG/3ML) 0.083% IN NEBU: 2.5000 mg | INHALATION_SOLUTION | RESPIRATORY_TRACT | Status: DC | PRN

## 2013-02-05 MED ORDER — ACETAMINOPHEN 325 MG PO TABS
650.0000 mg | ORAL_TABLET | Freq: Four times a day (QID) | ORAL | Status: DC | PRN
  Administered 2013-02-05: 650 mg via ORAL
  Filled 2013-02-05: qty 2

## 2013-02-05 NOTE — Progress Notes (Signed)
ANTICOAGULATION CONSULT NOTE - Initial Consult  Pharmacy Consult for apixiban Indication: nonvalvular atrial fibrillation  No Known Allergies  Patient Measurements: Height: 5\' 3"  (160 cm) Weight: 180 lb 12.4 oz (82 kg) IBW/kg (Calculated) : 52.4  Vital Signs: Temp: 97.3 F (36.3 C) (01/25 1107) Temp src: Oral (01/25 1107) BP: 129/54 mmHg (01/25 1107) Pulse Rate: 70 (01/25 1107)  Labs:  Recent Labs  02/04/13 1042 02/04/13 2245 02/05/13 0250  HGB 14.2  --  13.0  HCT 41.9  --  38.3  PLT 282  --  261  HEPARINUNFRC  --  0.72* 0.65  CREATININE 1.15*  --  1.17*    Estimated Creatinine Clearance: 35 ml/min (by C-G formula based on Cr of 1.17).   Medical History: Past Medical History  Diagnosis Date  . Asthma   . PVC's (premature ventricular contractions)   . Murmur, cardiac   . Arthritis   . CHF (congestive heart failure)   . Atrial fibrillation     Medications:  Scheduled:  . cephALEXin  500 mg Oral Q8H  . diltiazem  180 mg Oral Daily  . levalbuterol  0.63 mg Nebulization BID  . mometasone-formoterol  2 puff Inhalation BID  . montelukast  10 mg Oral QHS   Infusions:  . sodium chloride      Assessment: 80 yo F presented to ER with new onset afib. Patient is rate controlled on diltiazem gtt.  Patient is currently on heparin gtt with this morning's HL therapeutic at 0.65. Pharmacy has been consulted to transition patient to [place].  SCr is 1.17 but looks stable with a CrCl ~35. She weighs >60kg and is >76 yo.  Patient qualifies for non-adjusted dose.  H/H and plt are wnl. No reports of bleeding noted.  Goal of Therapy:  Anticoagulation of PO apixiban Monitor platelets by anticoagulation protocol: Yes   Plan:  - continue heparin gtt until administration of PO apixaban - start apixaban PO 5mg  BID - monitor for s/s of bleeding  Ovid Curd E. Jacqlyn Larsen, PharmD Clinical Pharmacist - Resident Pager: [phone] Pharmacy: [phone] 02/05/2013 1:45 PM

## 2013-02-05 NOTE — Progress Notes (Signed)
Echo Lab  2D Echocardiogram completed.  [place], RDCS 02/05/2013 10:44 AM

## 2013-02-05 NOTE — Progress Notes (Signed)
Family Medicine Teaching Service Daily Progress Note Intern Pager: [phone]  Patient name: Crystal Kerr Medical record number: [id number] Date of birth: Dec 10, 1924 Age: 80 y.o. Gender: female  Primary Care Provider: Lamar Blinks, MD Consultants: Cardiology Code Status: DNR; Agreeable to Cardioversion if needed  Pt Overview and Major Events to Date:  1/24: Admitted with dyspnea and A fib RVR  Assessment and Plan: Crystal Kerr is a 80 y.o. female presenting with new onset Afib. PMH is significant for asthma, HFpEF, Arthritis, PVCs.   # New onset Afib  - Already on Diltiazem for symptomatic PVCs. ECHO 04/2011: showed mild LVH w/ EF 55% - 60% - istat trop wnl; CTA showed: Cardiomegaly with bilateral diffuse pulmonary interstitial prominence suggesting congestive heart failure with interstitial edema. NO PE; BNP 693  - Cardiology consulted - appreciate recommendations Cardizem drip start  Heparin infusion; consider change over to Apixaban long term  Lasix 20mg  IV x 1 Repeat Echo - Check TSH, ECHO   # Asthma  - dyspnea improved - SOB more likely related to new afib than asthma exacerbation; Oxygen saturation normal on RA- will not use steroids  - Xopenex q6  - Dulera   # Chronic back pain  - Hold mobic, consider restarting NSAID with naproxen for reduced CVD risk but will be very cautious in this case, appreciate cardiology recommendations.  - Continue Tylenol and ultram , consider NSAID  # Dysuria  - With moderate leuks will Treat with Keflex, Possibly related to vaginal atrophy but will get UA to evaluate for UTI  - continue Premarin  - No reflex culture sent, ordered  FEN/GI: Heart healthy diet; SLIV  Prophylaxis: On therapeutic heparin  Disposition: Transition out of stepdown when controlled without drips.   Subjective: Feels better, most notable dyspnea improved, requesting mobic back.   Objective: Temp:  [97.5 F (36.4 C)-98.5 F (36.9 C)] 97.6  F (36.4 C) (01/25 0713) Pulse Rate:  [63-126] 79 (01/25 0713) Resp:  [17-28] 20 (01/25 0713) BP: (104-153)/(33-102) 136/63 mmHg (01/25 0713) SpO2:  [93 %-100 %] 97 % (01/25 0713) Weight:  [180 lb 12.4 oz (82 kg)-183 lb 1 oz (83.037 kg)] 180 lb 12.4 oz (82 kg) (01/24 1520) Physical Exam: Gen: NAD, alert, cooperative with exam HEENT: NCAT, EOMI, PERRL CV: irregular irregular rhythm, rate around 100, no murmur Resp: mild exp wheeze, non labored Abd: + BS, eating breakfast Ext: No edema, warm Neuro: Alert and oriented, No gross deficits   Laboratory:  Recent Labs Lab 02/04/13 1042 02/05/13 0250  WBC 9.5 11.0*  HGB 14.2 13.0  HCT 41.9 38.3  PLT 282 261    Recent Labs Lab 02/04/13 0905 02/04/13 1042 02/05/13 0250  NA 138 141 136*  K 4.6 4.7 4.2  CL 105 102 96  CO2 24 24 21   BUN 20 22 27*  CREATININE 1.17* 1.15* 1.17*  CALCIUM 9.0 9.2 9.4  PROT  --  7.5  --   BILITOT  --  0.4  --   ALKPHOS  --  108  --   ALT  --  22  --   AST  --  25  --   GLUCOSE 103* 98 169*   TROPIPOC 0.00  proBNP 696.3  Imaging/Diagnostic Tests:  CTA 1/24  IMPRESSION:  1. Cardiomegaly with bilateral diffuse pulmonary interstitial  prominence suggesting congestive heart failure with interstitial  edema. Pneumonitis cannot be excluded.  2. Small pericardial effusion .  EKG: a fib with RVR; left axis deviation; no ST or  T-wave abnormalites   Timmothy Euler, MD 02/05/2013, 8:02 AM PGY-2, [place] Intern pager: [phone], text pages welcome

## 2013-02-05 NOTE — Progress Notes (Signed)
  ANTICOAGULATION CONSULT NOTE - Follow Up Consult  Pharmacy Consult for heparin gtt Indication: atrial fibrillation  No Known Allergies  Patient Measurements: Height: 5\' 3"  (160 cm) Weight: 180 lb 12.4 oz (82 kg) IBW/kg (Calculated) : 52.4 Heparin Dosing Weight: 66 kg  Vital Signs: Temp: 97.6 F (36.4 C) (01/25 0713) Temp src: Oral (01/25 0713) BP: 136/63 mmHg (01/25 0713) Pulse Rate: 79 (01/25 0713)  Labs:  Recent Labs  02/04/13 1042 02/04/13 2245 02/05/13 0250  HGB 14.2  --  13.0  HCT 41.9  --  38.3  PLT 282  --  261  HEPARINUNFRC  --  0.72* 0.65  CREATININE 1.15*  --  1.17*    Estimated Creatinine Clearance: 35 ml/min (by C-G formula based on Cr of 1.17).   Medications:  Scheduled:  . cephALEXin  500 mg Oral Q8H  . levalbuterol  0.63 mg Nebulization BID  . mometasone-formoterol  2 puff Inhalation BID  . montelukast  10 mg Oral QHS   Infusions:  . sodium chloride    . diltiazem (CARDIZEM) infusion 5 mg/hr (02/05/13 0600)  . heparin 900 Units/hr (02/05/13 0600)    Assessment: 80 yo F presented to ER with new onset afib.  Pharmacy was consulted to start heparin gtt.  Patient is rate controlled on diltiazem gtt.  Initial HL returned 0.72 and rate was decreased from 1000 units/hr to 900 units/hr.  This morning's HL is therapeutic at 0.65.  SCr is elevated but looks stable with a CrCl ~35.  H/H and plt are wnl.  No reports of bleeding noted.  Goal of Therapy:  Heparin level 0.3-0.7 units/ml Monitor platelets by anticoagulation protocol: Yes   Plan:  - continue heparin gtt at 900 units/hr - f/u AM labs for next HL - daily HL/CBC - monitor for s/s of bleeding  Ovid Curd E. Jacqlyn Larsen, PharmD Clinical Pharmacist - Resident Pager: [phone] Pharmacy: [phone] 02/05/2013 11:06 AM

## 2013-02-05 NOTE — Progress Notes (Signed)
Patient is being transferred to 2 [place]. Report was given to [place], Therapist, sports.

## 2013-02-05 NOTE — Progress Notes (Signed)
Copied from my co-sign of H&PE today. Seen and examined.  Discussed with Dr. Bridgett Larsson.  Agree with her documentation and management.  Also appreciate cards help.  Briefly, 80 yo female who has been feeling weak and easily short of breath for the past couple of months.  Yesterday, went to physician for a med refill and found to be in a fib with RVR.  Patient has never had the sense of palpitations or tachycardia.   Issues.  While she has a history of asthma and feels she might be a tad wheezy, I believe her acute problems are all cardiac with a fib, RVR and some element of CHF, type uncertain.  She is feeling much better with rate control.  WU in process.  Clearly, we must approach as a fib of indeterminate duration.

## 2013-02-05 NOTE — Progress Notes (Signed)
I agree with the assessment documented by Fredderick Severance, Stage manager.

## 2013-02-05 NOTE — Progress Notes (Signed)
Patient transferred step down unit. Alert and oriented. Denies pain. Staff assisted with Adl's. Will continue to monitor. Kayleen Memos RN

## 2013-02-05 NOTE — Progress Notes (Signed)
Pleasant , very cooperative . Eagerly participates in her care .Amb to BR w/steady gait .

## 2013-02-05 NOTE — H&P (Signed)
Seen and examined.  Discussed with Dr. Bridgett Larsson.  Agree with her documentation and management.  Also appreciate cards help.  Briefly, 80 yo female who has been feeling weak and easily short of breath for the past couple of months.  Yesterday, went to physician for a med refill and found to be in a fib with RVR.  Patient has never had the sense of palpitations or tachycardia.   Issues.  While she has a history of asthma and feels she might be a tad wheezy, I believe her acute problems are all cardiac with a fib, RVR and some element of CHF, type uncertain.  She is feeling much better with rate control.  WU in process.  Clearly, we must approach as a fib of indeterminate duration.

## 2013-02-05 NOTE — Progress Notes (Addendum)
SUBJECTIVE:  Feels much better today.  No complaints  OBJECTIVE:   Vitals:   Filed Vitals:   02/05/13 0510 02/05/13 0713 02/05/13 0914 02/05/13 1107  BP: 128/60 136/63  129/54  Pulse: 87 79  70  Temp:  97.6 F (36.4 C)  97.3 F (36.3 C)  TempSrc:  Oral  Oral  Resp:  20  20  Height:      Weight:      SpO2: 97% 97% 96% 97%   I&O's:   Intake/Output Summary (Last 24 hours) at 02/05/13 1129 Last data filed at 02/05/13 0500  Gross per 24 hour  Intake   1140 ml  Output    975 ml  Net    165 ml   TELEMETRY: Reviewed telemetry pt in atrial fibrillation with HR in 80-90's     PHYSICAL EXAM General: Well developed, well nourished, in no acute distress Head: Eyes PERRLA, No xanthomas.   Normal cephalic and atramatic  Lungs:   Clear bilaterally to auscultation and percussion. Heart:   Irregularly irregular S1 S2 Pulses are 2+ & equal. Abdomen: Bowel sounds are positive, abdomen soft and non-tender without masses Extremities:   No clubbing, cyanosis or edema.  DP +1 Neuro: Alert and oriented X 3. Psych:  Good affect, responds appropriately   LABS: Basic Metabolic Panel:  Recent Labs  02/04/13 1042 02/05/13 0250  NA 141 136*  K 4.7 4.2  CL 102 96  CO2 24 21  GLUCOSE 98 169*  BUN 22 27*  CREATININE 1.15* 1.17*  CALCIUM 9.2 9.4   Liver Function Tests:  Recent Labs  02/04/13 1042  AST 25  ALT 22  ALKPHOS 108  BILITOT 0.4  PROT 7.5  ALBUMIN 3.6   No results found for this basename: LIPASE, AMYLASE,  in the last 72 hours CBC:  Recent Labs  02/04/13 1042 02/05/13 0250  WBC 9.5 11.0*  HGB 14.2 13.0  HCT 41.9 38.3  MCV 84.6 83.1  PLT 282 261   Cardiac Enzymes: No results found for this basename: CKTOTAL, CKMB, CKMBINDEX, TROPONINI,  in the last 72 hours BNP: No components found with this basename: POCBNP,  D-Dimer:  Recent Labs  02/04/13 1042  DDIMER 1.45*   Hemoglobin A1C: No results found for this basename: HGBA1C,  in the last 72  hours Fasting Lipid Panel: No results found for this basename: CHOL, HDL, LDLCALC, TRIG, CHOLHDL, LDLDIRECT,  in the last 72 hours Thyroid Function Tests:  Recent Labs  02/04/13 1420  TSH 1.948   Anemia Panel: No results found for this basename: VITAMINB12, FOLATE, FERRITIN, TIBC, IRON, RETICCTPCT,  in the last 72 hours Coag Panel:   No results found for this basename: INR, PROTIME    RADIOLOGY: Dg Chest 2 View  02/04/2013   CLINICAL DATA:  Shortness of breath  EXAM: CHEST  2 VIEW  COMPARISON:  06/30/2012  FINDINGS: Chronically prominent interstitial markings and hyperinflation likely indicate emphysema, unchanged. No new focal pulmonary opacity. No pleural effusion. No acute osseous finding. Mild cardiomegaly again noted.  IMPRESSION: No acute focal finding.  Emphysematous changes are reidentified.   Electronically Signed   By: Conchita Paris M.D.   On: 02/04/2013 14:19   Dg Chest 2 View  02/04/2013   CLINICAL DATA:  Shortness of breath.  Fatigue.  Asthma.  EXAM: CHEST  2 VIEW  COMPARISON:  02/04/2013  FINDINGS: Heart size remains within normal limits. Increased mixed interstitial and airspace opacity is seen in the upper lobes bilaterally which  is symmetric, and may be due to pneumonia, aspiration, or less likely edema. No evidence of pulmonary consolidation or pleural effusion. No mass or lymphadenopathy identified.  IMPRESSION: New symmetric mixed interstitial and airspace opacity in the upper lobes. Differential diagnosis includes multilobar pneumonia, aspiration, or less likely edema.   Electronically Signed   By: Earle Gell M.D.   On: 02/04/2013 13:15   Ct Angio Chest Pe W/cm &/or Wo Cm  02/04/2013   CLINICAL DATA:  Shortness of breath.  Elevated D-dimer.  EXAM: CT ANGIOGRAPHY CHEST WITH CONTRAST  TECHNIQUE: Multidetector CT imaging of the chest was performed using the standard protocol during bolus administration of intravenous contrast. Multiplanar CT image reconstructions including  MIPs were obtained to evaluate the vascular anatomy.  CONTRAST:  43mL OMNIPAQUE IOHEXOL 350 MG/ML SOLN  COMPARISON:  Chest x-ray 02/04/2013.  FINDINGS: Atherosclerotic vascular changes in the thoracic aorta. No aneurysm or dissection. Great vessels are unremarkable. Pulmonary arteries are normal. No pulmonary embolus. Cardiomegaly. Small pericardial effusion.  Shotty nonspecific mediastinal lymph nodes are noted. No pathologic adenopathy noted using CT size criteria. Thoracic esophagus is unremarkable. Small sliding hiatal hernia is present. Adrenals are normal. Upper abdominal viscera unremarkable where visualized.  Large airways are patent. Diffuse bilateral pulmonary interstitial prominence noted. This can be seen with interstitial pneumonitis. Congestive heart failure and interstitial edema can also present in this fashion. No significant pleural effusion. No pneumothorax.  Chest wall is intact. Thyroid is normal. Supraclavicular and axillary regions are normal. Diffuse degenerative change thoracic spine with spinal scoliosis concave left present.  Review of the MIP images confirms the above findings.  IMPRESSION: 1. Cardiomegaly with bilateral diffuse pulmonary interstitial prominence suggesting congestive heart failure with interstitial edema. Pneumonitis cannot be excluded. 2. Small pericardial effusion .   Electronically Signed   By: Marcello Moores  Register   On: 02/04/2013 13:39    ASSESSMENT:  1. New onset atrial fibrillation of unknown duration in the setting of asthma exacerbation  2. Acute SOB most likely multifactorial from acute asthma exacerbation as well as  acute diastolic CHF from rapid afib.. 2D echo 04/2011 with normal LVF and grade I diastolic dysfunction - 2D echo from today pending 3. History of PVC's suppressed on Cardizem  4. History of asthma with frequent exacerbations  5. Acute on chronic diastolic CHF secondary to #1 - resolved clinically after IV Lasix 6. Elevated d-dimer with no PE  on CT scan  PLAN:  1. Change IV Cardizem to Cardizem CD 180mg  daily 2. Change IV Heparin gtt to Apixaban long term. Her CHADS VASC score is 2 (age>65 and female)  3. Check 2D echo to reassess LVF  4. No beta blocker due to asthma  5. OK to transfer to tele bed from cardiac standpoint       Sueanne Margarita, MD  02/05/2013  11:29 AM

## 2013-02-05 NOTE — Progress Notes (Signed)
Patient escorted to 2W with tech via wheelchair. Chart, belongings and medications sent.  NAD noted.  Patient denies pain.  Care relinquished at this time.

## 2013-02-06 DIAGNOSIS — I4891 Unspecified atrial fibrillation: Secondary | ICD-10-CM | POA: Diagnosis not present

## 2013-02-06 DIAGNOSIS — R0602 Shortness of breath: Secondary | ICD-10-CM | POA: Diagnosis present

## 2013-02-06 DIAGNOSIS — J45901 Unspecified asthma with (acute) exacerbation: Secondary | ICD-10-CM | POA: Diagnosis present

## 2013-02-06 DIAGNOSIS — R7989 Other specified abnormal findings of blood chemistry: Secondary | ICD-10-CM | POA: Diagnosis present

## 2013-02-06 DIAGNOSIS — J45909 Unspecified asthma, uncomplicated: Secondary | ICD-10-CM | POA: Diagnosis not present

## 2013-02-06 LAB — CBC
HCT: 38.3 % (ref 36.0–46.0)
Hemoglobin: 13.2 g/dL (ref 12.0–15.0)
MCH: 28.8 pg (ref 26.0–34.0)
MCHC: 34.5 g/dL (ref 30.0–36.0)
MCV: 83.6 fL (ref 78.0–100.0)
PLATELETS: 279 10*3/uL (ref 150–400)
RBC: 4.58 MIL/uL (ref 3.87–5.11)
RDW: 15.1 % (ref 11.5–15.5)
WBC: 29.2 10*3/uL — AB (ref 4.0–10.5)

## 2013-02-06 MED ORDER — CEPHALEXIN 500 MG PO CAPS: 500.0000 mg | ORAL_CAPSULE | Freq: Three times a day (TID) | ORAL | Status: DC

## 2013-02-06 MED ORDER — DILTIAZEM HCL ER COATED BEADS 180 MG PO CP24: 180.0000 mg | ORAL_CAPSULE | Freq: Every day | ORAL | Status: DC

## 2013-02-06 MED ORDER — APIXABAN 5 MG PO TABS: 5.0000 mg | ORAL_TABLET | Freq: Two times a day (BID) | ORAL | Status: DC

## 2013-02-06 NOTE — Progress Notes (Signed)
Patient and daughter given discharge instructions; prescriptions were called in by physician.  All questions answered.  Patient taken via wheelchair by Laverda Sorenson, NT to daughter's vehicle.

## 2013-02-06 NOTE — Discharge Instructions (Signed)
You were hospitalized for shortness of breath and palpitations that were due to an abnormal heart rhythm called atrial fibrillation, or A Fib. It seems to have reverted to a normal rhythm now and you can be discharged. Make sure you follow up with your primary care provider as well as the cardiologists.   Take keflex for 3 days to treat a UTI.  A larger dose of diltiazem is being prescribed for you than you were previously on. It is now 180mg  daily.   If you experience any new chest pain or pressure, shortness of breath, or fever please be seen by a medical professional right away.

## 2013-02-06 NOTE — Progress Notes (Signed)
FMTS Attending Note Patient seen and examined by me, discussed with resident team and I agree with Dr Bonner Puna' assessment and plans for discharge.  Patient reports no further dyspnea or chest pain; she has continued to have some dysuria at the end of voiding, is being treated empirically for possible uncomplicated UTI, although her UA is not suggestive of this.  I agree with plan for discharge today. Dalbert Mayotte, MD

## 2013-02-06 NOTE — Progress Notes (Addendum)
Patient ID: Crystal Kerr, female   DOB: [birth year], 80 y.o.   MRN: [id number]    SUBJECTIVE: The patient is in good spirits today. She's feeling better. Her echo revealed normal systolic left ventricular function with an EF of at least 65%. Heparin was converted to a apixaban. The patient will go home on this.   Filed Vitals:   02/05/13 1830 02/05/13 2030 02/05/13 2136 02/06/13 0440  BP: 124/48  136/70 117/70  Pulse:   90 84  Temp:  98.2 F (36.8 C) 97.6 F (36.4 C) 98 F (36.7 C)  TempSrc:  Oral Oral Oral  Resp:   21 20  Height:      Weight:   184 lb 8.4 oz (83.7 kg)   SpO2:  94% 96% 95%     Intake/Output Summary (Last 24 hours) at 02/06/13 0741 Last data filed at 02/06/13 0510  Gross per 24 hour  Intake   1074 ml  Output   1600 ml  Net   -526 ml    LABS: Basic Metabolic Panel:  Recent Labs  02/04/13 1042 02/05/13 0250  NA 141 136*  K 4.7 4.2  CL 102 96  CO2 24 21  GLUCOSE 98 169*  BUN 22 27*  CREATININE 1.15* 1.17*  CALCIUM 9.2 9.4   Liver Function Tests:  Recent Labs  02/04/13 1042  AST 25  ALT 22  ALKPHOS 108  BILITOT 0.4  PROT 7.5  ALBUMIN 3.6   No results found for this basename: LIPASE, AMYLASE,  in the last 72 hours CBC:  Recent Labs  02/05/13 0250 02/06/13 0349  WBC 11.0* 29.2*  HGB 13.0 13.2  HCT 38.3 38.3  MCV 83.1 83.6  PLT 261 279   Cardiac Enzymes: No results found for this basename: CKTOTAL, CKMB, CKMBINDEX, TROPONINI,  in the last 72 hours BNP: No components found with this basename: POCBNP,  D-Dimer:  Recent Labs  02/04/13 1042  DDIMER 1.45*   Hemoglobin A1C: No results found for this basename: HGBA1C,  in the last 72 hours Fasting Lipid Panel: No results found for this basename: CHOL, HDL, LDLCALC, TRIG, CHOLHDL, LDLDIRECT,  in the last 72 hours Thyroid Function Tests:  Recent Labs  02/04/13 1420  TSH 1.948    RADIOLOGY: Dg Chest 2 View  02/04/2013   CLINICAL DATA:  Shortness of breath  EXAM:  CHEST  2 VIEW  COMPARISON:  06/30/2012  FINDINGS: Chronically prominent interstitial markings and hyperinflation likely indicate emphysema, unchanged. No new focal pulmonary opacity. No pleural effusion. No acute osseous finding. Mild cardiomegaly again noted.  IMPRESSION: No acute focal finding.  Emphysematous changes are reidentified.   Electronically Signed   By: Conchita Paris M.D.   On: 02/04/2013 14:19   Dg Chest 2 View  02/04/2013   CLINICAL DATA:  Shortness of breath.  Fatigue.  Asthma.  EXAM: CHEST  2 VIEW  COMPARISON:  02/04/2013  FINDINGS: Heart size remains within normal limits. Increased mixed interstitial and airspace opacity is seen in the upper lobes bilaterally which is symmetric, and may be due to pneumonia, aspiration, or less likely edema. No evidence of pulmonary consolidation or pleural effusion. No mass or lymphadenopathy identified.  IMPRESSION: New symmetric mixed interstitial and airspace opacity in the upper lobes. Differential diagnosis includes multilobar pneumonia, aspiration, or less likely edema.   Electronically Signed   By: Earle Gell M.D.   On: 02/04/2013 13:15   Ct Angio Chest Pe W/cm &/or Wo Cm  02/04/2013  CLINICAL DATA:  Shortness of breath.  Elevated D-dimer.  EXAM: CT ANGIOGRAPHY CHEST WITH CONTRAST  TECHNIQUE: Multidetector CT imaging of the chest was performed using the standard protocol during bolus administration of intravenous contrast. Multiplanar CT image reconstructions including MIPs were obtained to evaluate the vascular anatomy.  CONTRAST:  6mL OMNIPAQUE IOHEXOL 350 MG/ML SOLN  COMPARISON:  Chest x-ray 02/04/2013.  FINDINGS: Atherosclerotic vascular changes in the thoracic aorta. No aneurysm or dissection. Great vessels are unremarkable. Pulmonary arteries are normal. No pulmonary embolus. Cardiomegaly. Small pericardial effusion.  Shotty nonspecific mediastinal lymph nodes are noted. No pathologic adenopathy noted using CT size criteria. Thoracic  esophagus is unremarkable. Small sliding hiatal hernia is present. Adrenals are normal. Upper abdominal viscera unremarkable where visualized.  Large airways are patent. Diffuse bilateral pulmonary interstitial prominence noted. This can be seen with interstitial pneumonitis. Congestive heart failure and interstitial edema can also present in this fashion. No significant pleural effusion. No pneumothorax.  Chest wall is intact. Thyroid is normal. Supraclavicular and axillary regions are normal. Diffuse degenerative change thoracic spine with spinal scoliosis concave left present.  Review of the MIP images confirms the above findings.  IMPRESSION: 1. Cardiomegaly with bilateral diffuse pulmonary interstitial prominence suggesting congestive heart failure with interstitial edema. Pneumonitis cannot be excluded. 2. Small pericardial effusion .   Electronically Signed   By: Marcello Moores  Register   On: 02/04/2013 13:39    PHYSICAL EXAM     Patient is oriented to person time and place. Affect is normal. Lungs reveal a few scattered rhonchi. Cardiac exam reveals S1 and S2. There is no significant peripheral edema.   TELEMETRY:  I have reviewed telemetry today February 06, 2013. There is atrial fib. The rate is reasonably controlled today on the current dose of oral Cardizem.   ASSESSMENT AND PLAN:    PVC's (premature ventricular contractions)     No significant ventricular ectopy    Atrial fibrillation with RVR     Atrial fib rate is now under control. This is a new diagnosis. At this point there has been no discussion of cardioversion. When the patient is seen back in the office, EKG can be done to see if she remains in atrial fibrillation. This episode of atrial fib may have been related to her asthma exacerbation. However we really do not know how long she has been in atrial fibrillation. When she is seen in followup, further decisions can be made concerning whether cardioversion should be considered. Heparin  was converted to apixaban, 5 mg twice a day. This was the dose recommended by pharmacy. The patient will need to have CBC and renal function followed.    Acute on chronic diastolic congestive heart failure      The patient's volume status is stable.    D-dimer, elevated     No pulmonary embolus by CT scan.    Asthma attack      This is improved.    Shortness of breath     Shortness of breath was multifactorial. Atrial fibrillation is controlled. Volume status is stable. Asthma has been treated.  The patient is now improved. She seems to be ready for discharge from the cardiology viewpoint. Plan should be made for her to be seen by one of the extenders in our group in the [employer] [facility] office. Dr. Fletcher Anon has seen her as an outpatient in the past. I am not sure if he will be able to see her going forward in the [employer] office or  not. This can be determined further when she is seen in followup by our extender team.   Dola Argyle 02/06/2013 7:41 AM

## 2013-02-06 NOTE — Discharge Summary (Signed)
[facility] Discharge Summary  Patient name: Crystal Kerr Medical record number: [id number] Date of birth: November 26, 1924 Age: 80 y.o. Gender: female Date of Admission: 02/04/2013  Date of Discharge: 02/06/2013 Admitting Physician: Zigmund Gottron, MD  Primary Care Provider: Lamar Blinks, MD Consultants: Cardiology  Indication for Hospitalization: AFib with RVR  Discharge Diagnoses/Problem List:  Patient Active Problem List   Diagnosis Date Noted  . D-dimer, elevated 02/06/2013  . Asthma attack 02/06/2013  . Shortness of breath 02/06/2013  . Atrial fibrillation with RVR 02/04/2013  . Acute on chronic diastolic congestive heart failure 02/04/2013  . Arthritis of spine 07/01/2012  . Intrinsic asthma 07/01/2012  . Dyspnea 04/12/2012  . PVC's (premature ventricular contractions) 04/24/2011  . Murmur, cardiac 04/24/2011   Disposition: Discharged home  Discharge Condition: Stable  Discharge Exam:  Gen: NAD, alert, cooperative with exam  CV: RRR, no murmur, no JVD  Resp: Non-labored, CTAB  Abd: + BS, soft, NT, ND  Ext: No edema, warm  Neuro: Alert and oriented, normal speech and gait  Brief Hospital Course:  Crystal Kerr is a 80 y.o. female presenting with new onset atrial fibrillations with rapid ventricular response. PMH is significant for asthma, HFpEF, Arthritis, PVCs.    She reported to [facility] with some shortness of breath and palpitations, found to be in AFib with RVR, presumably new-onset. She was placed on a cardizem drip and converted to normal sinus rhythm. Diltiazem 180mg  daily was continued, apixiban (Eliquis) 5mg  BID was started, and she remained in normal sinus rhythm. Repeat echocardiogram showed an ejection fraction of 65-70% and grade 2 diastolic dysfunction. POC troponin was negative. BNP was 696.3. D-dimer was elevated (1.45), though CTA of the chest showed no pulmonary embolus. It also revealed  cardiomegaly with bilateral diffuse pulmonary interstitial prominence suggesting congestive heart failure with interstitial edema.  Her dyspnea, thought to be due to AFib and not asthma, improved. Steroids were considered, but not given. Dulera and xopenex were given.   Mobic, which she takes for chronic low back pain, was held given the risk of CVD events. Pain control for arthritis should be discussed at follow up with PCP.    She complained of dysuria and had moderate leukocytes on urinalysis. She will be treated with keflex for uncomplicated UTI. Urine culture was ordered and is to be collected before discharge.   Issues for Follow Up:  - Repeat ECG to see if still in AFib (thought possibly due to asthma exacerbation).  - Patient was placed on eliquis and needs CBC and renal function monitoring.  - Discuss analgesics for arthritis.  Significant Procedures: None  Significant Labs and Imaging:   Recent Labs Lab 02/04/13 1042 02/05/13 0250 02/06/13 0349  WBC 9.5 11.0* 29.2*  HGB 14.2 13.0 13.2  HCT 41.9 38.3 38.3  PLT 282 261 279    Recent Labs Lab 02/04/13 0905 02/04/13 1042 02/05/13 0250  NA 138 141 136*  K 4.6 4.7 4.2  CL 105 102 96  CO2 24 24 21   GLUCOSE 103* 98 169*  BUN 20 22 27*  CREATININE 1.17* 1.15* 1.17*  CALCIUM 9.0 9.2 9.4  ALKPHOS  --  108  --   AST  --  25  --   ALT  --  22  --   ALBUMIN  --  3.6  --   proBNP 696.3   ECG: AFib with RVR, LAD, no ST or T wave abnormalities.   CTA 1/24  IMPRESSION:  1. Cardiomegaly with bilateral diffuse pulmonary interstitial  prominence suggesting congestive heart failure with interstitial  edema. Pneumonitis cannot be excluded.  2. Small pericardial effusion .   ECHO 1/25 - Upper normal LV wall thickness with LVEF 56-25%, grade 2 diastolic dysfunction. Moderate biatrial enlargement. Trivial mitral and tricuspid regurgitation. Unable to assess PASP. The atrial septum is thickened - consider lipomatous  hypertrophy, not well defined. Also apparent prominent epicardial fat pad, circumferential. If there is any clinical concern for other pericardial process, could consider CT imaging to further define.  Results/Tests Pending at Time of Discharge: Urine culture  Discharge Medications:    Medication List    STOP taking these medications       diltiazem 120 MG 24 hr capsule  Commonly known as:  TIAZAC     meloxicam 7.5 MG tablet  Commonly known as:  MOBIC     triamcinolone cream 0.1 %  Commonly known as:  KENALOG      TAKE these medications       albuterol 108 (90 BASE) MCG/ACT inhaler  Commonly known as:  PROVENTIL HFA;VENTOLIN HFA  Inhale 2 puffs into the lungs every 4 (four) hours as needed for wheezing (cough, shortness of breath or wheezing.).     apixaban 5 MG Tabs tablet  Commonly known as:  ELIQUIS  Take 1 tablet (5 mg total) by mouth 2 (two) times daily.     cephALEXin 500 MG capsule  Commonly known as:  KEFLEX  Take 1 capsule (500 mg total) by mouth every 8 (eight) hours.     diltiazem 180 MG 24 hr capsule  Commonly known as:  CARDIZEM CD  Take 1 capsule (180 mg total) by mouth daily.     fish oil-omega-3 fatty acids 1000 MG capsule  Take 2 g by mouth daily.     Fluticasone-Salmeterol 250-50 MCG/DOSE Aepb  Commonly known as:  ADVAIR DISKUS  Inhale 1 puff into the lungs 2 (two) times daily. PATIENT NEEDS OFFICE VISIT FOR ADDITIONAL REFILLS     glucosamine-chondroitin 500-400 MG tablet  Take 1 tablet by mouth 3 (three) times daily.     montelukast 10 MG tablet  Commonly known as:  SINGULAIR  TAKE 1 TABLET BY MOUTH DAILY     multivitamin tablet  Take 1 tablet by mouth daily.     PREMARIN vaginal cream  Generic drug:  conjugated estrogens  START WITH 0.5 GRAM APPLLIED VAGINALLY DAILY TAPER DOWN TO THREE TIMES WEEKLY AS DIRECTED     traMADol 50 MG tablet  Commonly known as:  ULTRAM        Discharge Instructions: Please refer to Patient Instructions  section of EMR for full details.  Patient was counseled important signs and symptoms that should prompt return to medical care, changes in medications, dietary instructions, activity restrictions, and follow up appointments.   Follow-Up Appointments: Follow-up Information   Schedule an appointment as soon as possible for a visit with Lamar Blinks, MD.   Specialty:  Family Medicine   Contact information:   [place] [address] [phone]       Vance Gather, MD 02/06/2013, 2:05 PM PGY-1, [place]

## 2013-02-06 NOTE — Discharge Summary (Signed)
FMTS Attending Note Patient seen and examined by me on day of discharge, I agree with resident plan for discharge and hospital follow up with her primary physician at [facility]. Dalbert Mayotte, MD

## 2013-02-06 NOTE — Progress Notes (Signed)
Family Medicine Teaching Service Daily Progress Note Intern Pager: [phone]  Patient name: Crystal Kerr Medical record number: [id number] Date of birth: 1924/11/26 Age: 80 y.o. Gender: female  Primary Care Provider: Lamar Blinks, MD Consultants: Cardiology Code Status: DNR; Agreeable to Cardioversion if needed  Pt Overview and Major Events to Date:  1/24: Admitted with dyspnea and A fib RVR  Assessment and Plan: Crystal Kerr is a 80 y.o. female presenting with new onset Afib. PMH is significant for asthma, HFpEF, Arthritis, PVCs.   # New onset Afib  - Already on Diltiazem for symptomatic PVCs. ECHO 04/2011: showed mild LVH w/ EF 55% - 60% - istat trop wnl; CTA showed: Cardiomegaly with bilateral diffuse pulmonary interstitial prominence suggesting congestive heart failure with interstitial edema. NO PE; BNP 693  - Cardiology consulted - appreciate recommendations Cardizem 180mg  daily  Apixaban 5mg  BID Repeat Echo: EF 14%-78%, grade 2 diastolic dysfunction - TSH 1.948  # Asthma  - dyspnea improved - SOB more likely related to new afib than asthma exacerbation; Oxygen saturation normal on RA- will not use steroids  - Xopenex q6  - Dulera   # Chronic back pain  - Hold mobic, consider restarting NSAID with naproxen for reduced CVD risk but will be very cautious in this case, appreciate cardiology recommendations.  - Continue tylenol and ultram, consider NSAID  # Dysuria  - With moderate leuks will treat with Keflex, Possibly related to vaginal atrophy but will get UA to evaluate for UTI  - continue Premarin  - No reflex culture sent, ordered  FEN/GI: Heart healthy diet; SLIV  Prophylaxis: Eliquis  Disposition: Likely discharge today  Subjective: Feels better. Denies CP, SOB, palpitations. + Intermittent dysuria.   Objective: Temp:  [97.3 F (36.3 C)-98.2 F (36.8 C)] 98 F (36.7 C) (01/26 0440) Pulse Rate:  [70-90] 84 (01/26 0440) Resp:  [20-21]  20 (01/26 0440) BP: (117-136)/(48-70) 117/70 mmHg (01/26 0440) SpO2:  [94 %-98 %] 95 % (01/26 0440) Weight:  [184 lb 8.4 oz (83.7 kg)] 184 lb 8.4 oz (83.7 kg) (01/25 2136) Physical Exam: Gen: NAD, alert, cooperative with exam CV: RRR, no murmur, no JVD Resp: Non-labored, CTAB Abd: + BS, soft, NT, ND Ext: No edema, warm Neuro: Alert and oriented, normal speech and gait  Laboratory:  Recent Labs Lab 02/04/13 1042 02/05/13 0250 02/06/13 0349  WBC 9.5 11.0* 29.2*  HGB 14.2 13.0 13.2  HCT 41.9 38.3 38.3  PLT 282 261 279    Recent Labs Lab 02/04/13 0905 02/04/13 1042 02/05/13 0250  NA 138 141 136*  K 4.6 4.7 4.2  CL 105 102 96  CO2 24 24 21   BUN 20 22 27*  CREATININE 1.17* 1.15* 1.17*  CALCIUM 9.0 9.2 9.4  PROT  --  7.5  --   BILITOT  --  0.4  --   ALKPHOS  --  108  --   ALT  --  22  --   AST  --  25  --   GLUCOSE 103* 98 169*   TROP POC 0.00  proBNP 696.3  Imaging/Diagnostic Tests:  CTA 1/24  IMPRESSION:  1. Cardiomegaly with bilateral diffuse pulmonary interstitial  prominence suggesting congestive heart failure with interstitial  edema. Pneumonitis cannot be excluded.  2. Small pericardial effusion .  EKG: a fib with RVR; left axis deviation; no ST or T-wave abnormalites  ECHO 1/25 - Upper normal LV wall thickness with LVEF 29-56%, grade 2 diastolic dysfunction. Moderate biatrial enlargement. Trivial mitral and  tricuspid regurgitation. Unable to assess PASP. The atrial septum is thickened - consider lipomatous hypertrophy, not well defined. Also apparent prominent epicardial fat pad, circumferential. If there is any clinical concern for other pericardial process, could consider CT imaging to further define.  Vance Gather, MD 02/06/2013, 8:20 AM PGY-1, [place] Intern pager: [phone], text pages welcome

## 2013-02-06 NOTE — Care Management Note (Signed)
    Page 1 of 1   02/06/2013     4:33:49 PM   CARE MANAGEMENT NOTE 02/06/2013  Patient:  Crystal Kerr, Crystal Kerr   Account Number:  [id number]  Date Initiated:  02/06/2013  Documentation initiated by:  Yousof Alderman  Subjective/Objective Assessment:   PT ADM ON 1/24 WITH AFIB WITH RVR.  PTA, PT INDEPENDENT OF ADLS; SUPPORTIVE SON AT BEDSIDE.     Action/Plan:   MET WITH PT TO GIVE ELIQUIS 30 DAY FREE TRIAL CARD.  CARD HAS BEEN ACTIVATED.  PT DENIES ANY HOME NEEDS; APPRECIATIVE OF HELP.   Anticipated DC Date:  02/06/2013   Anticipated DC Plan:  [place]  CM consult  Medication Assistance      Choice offered to / List presented to:             Status of service:  Completed, signed off Medicare Important Message given?   (If response is "NO", the following Medicare IM given date fields will be blank) Date Medicare IM given:   Date Additional Medicare IM given:    Discharge Disposition:  HOME/SELF CARE  Per UR Regulation:  Reviewed for med. necessity/level of care/duration of stay  If discussed at [place] of Stay Meetings, dates discussed:    Comments:  02/06/13 Antoine Primas [phone] PER REP AT [place]:: auth required [phone], tier 4, co-pay at retail $85.00 patient can use: [facility], [employer], [employer], [facility], [employer], and [employer]  PT NOTIFIED OF $85 COPAY CALLED IN PRIOR AUTHORIZATION, THOUGH IT IS CURRENTLY IN REVIEW, AS PT HAS NOT BEEN ON ANOTHER ANTICOAGULANT AGENT IN THE PAST.  REP [place] AND FAILED ANOTHER AGENT IN ORDER TO GET APPROVED.  SHE STATED THAT SHE WOULD NOTE PT'S AGE, AND THAT CARDIOLOGIST FELT ELIQUIS SAFER MED THAN COUMADIN.  WILL FOLLOW UP.

## 2013-02-08 ENCOUNTER — Ambulatory Visit: Payer: Medicare Other

## 2013-02-08 ENCOUNTER — Ambulatory Visit (INDEPENDENT_AMBULATORY_CARE_PROVIDER_SITE_OTHER): Payer: Medicare Other | Admitting: Family Medicine

## 2013-02-08 VITALS — BP 124/78 | HR 95 | Temp 98.1°F | Resp 18 | Ht 62.0 in | Wt 184.0 lb

## 2013-02-08 DIAGNOSIS — J189 Pneumonia, unspecified organism: Secondary | ICD-10-CM | POA: Diagnosis not present

## 2013-02-08 DIAGNOSIS — I5031 Acute diastolic (congestive) heart failure: Secondary | ICD-10-CM | POA: Diagnosis not present

## 2013-02-08 DIAGNOSIS — Y95 Nosocomial condition: Secondary | ICD-10-CM

## 2013-02-08 DIAGNOSIS — R0602 Shortness of breath: Secondary | ICD-10-CM | POA: Diagnosis not present

## 2013-02-08 DIAGNOSIS — J45901 Unspecified asthma with (acute) exacerbation: Secondary | ICD-10-CM | POA: Diagnosis not present

## 2013-02-08 LAB — POCT CBC
Granulocyte percent: 68.4 %G (ref 37–80)
HCT, POC: 43.3 % (ref 37.7–47.9)
Hemoglobin: 13.4 g/dL (ref 12.2–16.2)
Lymph, poc: 2.7 (ref 0.6–3.4)
MCH: 27.9 pg (ref 27–31.2)
MCHC: 30.9 g/dL — AB (ref 31.8–35.4)
MCV: 90.2 fL (ref 80–97)
MID (CBC): 1.2 — AB (ref 0–0.9)
MPV: 8.9 fL (ref 0–99.8)
POC Granulocyte: 8.5 — AB (ref 2–6.9)
POC LYMPH %: 21.7 % (ref 10–50)
POC MID %: 9.9 %M (ref 0–12)
Platelet Count, POC: 263 10*3/uL (ref 142–424)
RBC: 4.8 M/uL (ref 4.04–5.48)
RDW, POC: 16 %
WBC: 12.4 10*3/uL — AB (ref 4.6–10.2)

## 2013-02-08 MED ORDER — POTASSIUM CHLORIDE CRYS ER 20 MEQ PO TBCR: 20.0000 meq | EXTENDED_RELEASE_TABLET | Freq: Every day | ORAL | Status: DC

## 2013-02-08 MED ORDER — MOXIFLOXACIN HCL 400 MG PO TABS: 400.0000 mg | ORAL_TABLET | Freq: Every day | ORAL | Status: DC

## 2013-02-08 MED ORDER — DILTIAZEM HCL ER COATED BEADS 240 MG PO CP24: 240.0000 mg | ORAL_CAPSULE | Freq: Every day | ORAL | Status: DC

## 2013-02-08 MED ORDER — FUROSEMIDE 40 MG PO TABS: 40.0000 mg | ORAL_TABLET | Freq: Every day | ORAL | Status: DC

## 2013-02-08 NOTE — Patient Instructions (Signed)
Take an antibiotic - Avelox - tonight and take a lasix tonight with a potassium.  Take the avelox with dinner every night and the lasix and potassium twice a day - once in the morning with breakfast and again with lunch.  Return to clinic tomorrow afternoon for recheck on your asthma and oxygen saturation and heart rate.  If you are improving, we may be able to add in steroids for your asthma exacerbation.  Also, the lasix will be temporary - we will want to adjust that.  Increase your cardizem from 180 to 240mg  daily tomorrow morning.  Pneumonia, Adult Pneumonia is an infection of the lungs.  CAUSES Pneumonia may be caused by bacteria or a virus. Usually, these infections are caused by breathing infectious particles into the lungs (respiratory tract). SYMPTOMS   Cough.  Fever.  Chest pain.  Increased rate of breathing.  Wheezing.  Mucus production. DIAGNOSIS  If you have the common symptoms of pneumonia, your caregiver will typically confirm the diagnosis with a chest X-ray. The X-ray will show an abnormality in the lung (pulmonary infiltrate) if you have pneumonia. Other tests of your blood, urine, or sputum may be done to find the specific cause of your pneumonia. Your caregiver may also do tests (blood gases or pulse oximetry) to see how well your lungs are working. TREATMENT  Some forms of pneumonia may be spread to other people when you cough or sneeze. You may be asked to wear a mask before and during your exam. Pneumonia that is caused by bacteria is treated with antibiotic medicine. Pneumonia that is caused by the influenza virus may be treated with an antiviral medicine. Most other viral infections must run their course. These infections will not respond to antibiotics.  PREVENTION A pneumococcal shot (vaccine) is available to prevent a common bacterial cause of pneumonia. This is usually suggested for:  People over 54 years old.  Patients on chemotherapy.  People with chronic  lung problems, such as bronchitis or emphysema.  People with immune system problems. If you are over 65 or have a high risk condition, you may receive the pneumococcal vaccine if you have not received it before. In some countries, a routine influenza vaccine is also recommended. This vaccine can help prevent some cases of pneumonia.You may be offered the influenza vaccine as part of your care. If you smoke, it is time to quit. You may receive instructions on how to stop smoking. Your caregiver can provide medicines and counseling to help you quit. HOME CARE INSTRUCTIONS   Cough suppressants may be used if you are losing too much rest. However, coughing protects you by clearing your lungs. You should avoid using cough suppressants if you can.  Your caregiver may have prescribed medicine if he or she thinks your pneumonia is caused by a bacteria or influenza. Finish your medicine even if you start to feel better.  Your caregiver may also prescribe an expectorant. This loosens the mucus to be coughed up.  Only take over-the-counter or prescription medicines for pain, discomfort, or fever as directed by your caregiver.  Do not smoke. Smoking is a common cause of bronchitis and can contribute to pneumonia. If you are a smoker and continue to smoke, your cough may last several weeks after your pneumonia has cleared.  A cold steam vaporizer or humidifier in your room or home may help loosen mucus.  Coughing is often worse at night. Sleeping in a semi-upright position in a recliner or using a couple  pillows under your head will help with this.  Get rest as you feel it is needed. Your body will usually let you know when you need to rest. SEEK IMMEDIATE MEDICAL CARE IF:   Your illness becomes worse. This is especially true if you are elderly or weakened from any other disease.  You cannot control your cough with suppressants and are losing sleep.  You begin coughing up blood.  You develop pain  which is getting worse or is uncontrolled with medicines.  You have a fever.  Any of the symptoms which initially brought you in for treatment are getting worse rather than better.  You develop shortness of breath or chest pain. MAKE SURE YOU:   Understand these instructions.  Will watch your condition.  Will get help right away if you are not doing well or get worse. Document Released: 12/29/2004 Document Revised: 03/23/2011 Document Reviewed: 03/20/2010 [facility] Patient Information 2014 [place], [place].

## 2013-02-08 NOTE — Progress Notes (Addendum)
This chart was scribed for Crystal Cheadle, MD by [name], ED Scribe. This patient was seen in room 9 and the patient's care was started at 8:28 PM. Subjective:    Patient ID: Crystal Kerr, female    DOB: 1924/07/13, 80 y.o.   MRN: [id number]  Chief Complaint  Patient presents with  . Cough    She was released from hospital 02/04/13 and is concerned that the coiugh might hurt her heart    HPI HPI Comments: Crystal Kerr is a 80 y.o. female who was admitted to the hospital for dyspnea and new onset A fib with RVR from 02/04/13-02/06/13 at [facility]. She was d/c'd on diltiazem 180 mg daily for rate control, with rates ranging from to 70-90 on day of discharge and O2 sats ranging from 94-98% - usu about 95%. She was started on Eliquis 5mg  BID for anticoagulation. Chest CT showed cardiomegaly suggesting congestive heart failure with interstitial edema, bnp 696, and pt reports she was diuresed w/ lasix during her hsop. Her wbc count on the day of discharge Increased from 11 to 29, but this was attributed to poss UTI - mild leukocytosis on UA - and she was started on Keflex tid x 3d. She has a history of asthma but SHoB seemed more related to her RVR so she was told to continue dulera and xopanex but not treated with steroids.  She is now complaining of a worsening cough that started 1 day ago. Pt states that she's not been able to lay on her back for years because it makes it hard for her to breath - she can sleep on her side with just 1 pillow - this is unchanged. She states she does have a small amount of leg swelling. Pt states this feels like a normal flair of her asthma but her daughter states that she thinks it seems much worse than that.  Has noticed that she is much more fatigued and DOE than usu. No f/c.  Past Medical History  Diagnosis Date  . Asthma   . PVC's (premature ventricular contractions)   . Murmur, cardiac   . Arthritis   . CHF (congestive heart failure)   .  Atrial fibrillation    No Known Allergies Current Outpatient Prescriptions on File Prior to Visit  Medication Sig Dispense Refill  . albuterol (PROVENTIL HFA;VENTOLIN HFA) 108 (90 BASE) MCG/ACT inhaler Inhale 2 puffs into the lungs every 4 (four) hours as needed for wheezing (cough, shortness of breath or wheezing.).  1 Inhaler  1  . apixaban (ELIQUIS) 5 MG TABS tablet Take 1 tablet (5 mg total) by mouth 2 (two) times daily.  60 tablet  0  . cephALEXin (KEFLEX) 500 MG capsule Take 1 capsule (500 mg total) by mouth every 8 (eight) hours.  9 capsule  0  . diltiazem (CARDIZEM CD) 180 MG 24 hr capsule Take 1 capsule (180 mg total) by mouth daily.  30 capsule  0  . fish oil-omega-3 fatty acids 1000 MG capsule Take 2 g by mouth daily.      . Fluticasone-Salmeterol (ADVAIR DISKUS) 250-50 MCG/DOSE AEPB Inhale 1 puff into the lungs 2 (two) times daily. PATIENT NEEDS OFFICE VISIT FOR ADDITIONAL REFILLS  60 each  0  . glucosamine-chondroitin 500-400 MG tablet Take 1 tablet by mouth 3 (three) times daily.      . montelukast (SINGULAIR) 10 MG tablet TAKE 1 TABLET BY MOUTH DAILY  90 tablet  1  . Multiple Vitamin (MULTIVITAMIN)  tablet Take 1 tablet by mouth daily.      Marland Kitchen PREMARIN vaginal cream START WITH 0.5 GRAM APPLLIED VAGINALLY DAILY - TAPER DOWN TO THREE TIMES WEEKLY AS DIRECTED  30 g  1  . traMADol (ULTRAM) 50 MG tablet        No current facility-administered medications on file prior to visit.    Review of Systems  Constitutional: Positive for activity change and fatigue. Negative for fever, chills and diaphoresis.  Respiratory: Positive for cough, chest tightness, shortness of breath and wheezing.   Cardiovascular: Positive for leg swelling. Negative for chest pain and palpitations.       + chronic orthopnea  Musculoskeletal: Positive for arthralgias and gait problem. Negative for joint swelling.  Psychiatric/Behavioral: Positive for sleep disturbance.      Vital Signs: BP 124/78  Pulse 104   Temp(Src) 98.1 F (36.7 C) (Oral)  Resp 18  Ht 5\' 2"  (1.575 m)  Wt 184 lb (83.462 kg)  BMI 33.65 kg/m2  SpO2 2% Objective:   Physical Exam  Nursing note and vitals reviewed. Constitutional: She is oriented to person, place, and time. She appears well-developed and well-nourished. No distress.  HENT:  Head: Normocephalic and atraumatic.  Eyes: EOM are normal.  Neck: Neck supple. No tracheal deviation present.  Cardiovascular: An irregularly irregular rhythm present. Tachycardia present.   Trace lower ext edema  Pulmonary/Chest: Effort normal. No accessory muscle usage. No respiratory distress. She has wheezes. She has rhonchi. She has rales.  Diffuse exp wheeze and rhonchi throughout all post lung fields with deep breathing. Fine inspiratory rales bibasilar and anteriorly.  Musculoskeletal: Normal range of motion.  Neurological: She is alert and oriented to person, place, and time.  Skin: Skin is warm and dry.  Psychiatric: She has a normal mood and affect. Her behavior is normal.   Duoneb given in clinic x 1 - increased cough and HR.    Results for orders placed in visit on 02/08/13  POCT CBC      Result Value Range   WBC 12.4 (*) 4.6 - 10.2 K/uL   Lymph, poc 2.7  0.6 - 3.4   POC LYMPH PERCENT 21.7  10 - 50 %L   MID (cbc) 1.2 (*) 0 - 0.9   POC MID % 9.9  0 - 12 %M   POC Granulocyte 8.5 (*) 2 - 6.9   Granulocyte percent 68.4  37 - 80 %G   RBC 4.80  4.04 - 5.48 M/uL   Hemoglobin 13.4  12.2 - 16.2 g/dL   HCT, POC 43.3  37.7 - 47.9 %   MCV 90.2  80 - 97 fL   MCH, POC 27.9  27 - 31.2 pg   MCHC 30.9 (*) 31.8 - 35.4 g/dL   RDW, POC 16.0     Platelet Count, POC 263  142 - 424 K/uL   MPV 8.9  0 - 99.8 fL   [facility] reading (PRIMARY) by  Dr. Brigitte Pulse. CXR: increased right lower lobe effusion and infiltrate.    EKG: a fib - rate 100s. EXAM: CHEST 2 VIEW  COMPARISON: CT ANGIO CHEST W/CM &/OR WO/CM dated 02/04/2013; DG CHEST 2 VIEW dated 02/04/2013  FINDINGS: The lungs remain well  expanded. There are mildly increased interstitial densities present at the right lung base. Stable mildly increased density is present on the left inferiorly. There is no pleural effusion.The cardiac silhouette is top-normal in size. The pulmonary vascularity is not engorged. The mediastinum is normal in width. The  observed portions of the bony thorax appear normal.  IMPRESSION: Mildly increased density at the right lung base suggests progressive atelectasis. There is no alveolar pneumonia nor pleural effusion nor evidence of CHF.   Assessment & Plan:   SOB (shortness of breath) - Plan: EKG 12-Lead, POCT CBC, Brain natriuretic peptide, DG Chest 2 View  HAP (hospital-acquired pneumonia) - start Avelox tonight.  Recheck tomorrow w/ repeat CBC - if worsening at all, will need to consider repeat hospitalization  Asthma with acute exacerbation - I suspect pt will definitely need steroid treatment but I hesitate to start tonight w/ concern for worsening pneumonia - poss HCA - on CXR. Rec recheck tomorrow and consider starting then as long as wbc stable or improving.  No sig improvement after duoneb in clinic tonight.  Acute diastolic heart failure - bnp in hosp was 700 and CT showed cardiomegaly with interstitial pulm edema - echo confirmed grade 2 diastolic dysfxn, pt reports she was diuresesed w/ lasix in the hosp but fine insp rales heard bibasilarly and in ant chest so will start lasix 40 bid - for short term - repeat bnp pending - will likely need dose decreased soon.  A. Fib w/ RVR - increase dilt from 180 to 240 - on day of discharge HR was ranging 70-90 on dilt 180.  Likely increased now due to pulmonary status but could tolerate dilt increase in a little.  F/u tomorrow - in 16 hrs - w/ PCP Dr. Edilia Bo for recheck of VS, O2 sat, HR, cbc and consider starting steroids.  Meds ordered this encounter  Medications  . DISCONTD: diltiazem (CARDIZEM CD) 240 MG 24 hr capsule    Sig: Take 1  capsule (240 mg total) by mouth daily.    Dispense:  30 capsule    Refill:  0  . DISCONTD: moxifloxacin (AVELOX) 400 MG tablet    Sig: Take 1 tablet (400 mg total) by mouth daily.    Dispense:  7 tablet    Refill:  0  . DISCONTD: furosemide (LASIX) 40 MG tablet    Sig: Take 1 tablet (40 mg total) by mouth daily.    Dispense:  30 tablet    Refill:  0  . DISCONTD: potassium chloride SA (K-DUR,KLOR-CON) 20 MEQ tablet    Sig: Take 1 tablet (20 mEq total) by mouth daily. With lasix    Dispense:  30 tablet    Refill:  0  . potassium chloride SA (K-DUR,KLOR-CON) 20 MEQ tablet    Sig: Take 1 tablet (20 mEq total) by mouth daily. With lasix    Dispense:  30 tablet    Refill:  0  . moxifloxacin (AVELOX) 400 MG tablet    Sig: Take 1 tablet (400 mg total) by mouth daily.    Dispense:  7 tablet    Refill:  0  . furosemide (LASIX) 40 MG tablet    Sig: Take 1 tablet (40 mg total) by mouth daily.    Dispense:  30 tablet    Refill:  0  . diltiazem (CARDIZEM CD) 240 MG 24 hr capsule    Sig: Take 1 capsule (240 mg total) by mouth daily.    Dispense:  30 capsule    Refill:  0    I personally performed the services described in this documentation, which was scribed in my presence. The recorded information has been reviewed and considered, and addended by me as needed.  Crystal Cheadle, MD MPH

## 2013-02-09 ENCOUNTER — Ambulatory Visit (INDEPENDENT_AMBULATORY_CARE_PROVIDER_SITE_OTHER): Payer: Medicare Other | Admitting: Family Medicine

## 2013-02-09 VITALS — BP 122/78 | HR 116 | Temp 98.2°F | Resp 16 | Ht 62.0 in | Wt 180.0 lb

## 2013-02-09 DIAGNOSIS — D72829 Elevated white blood cell count, unspecified: Secondary | ICD-10-CM

## 2013-02-09 DIAGNOSIS — R05 Cough: Secondary | ICD-10-CM | POA: Diagnosis not present

## 2013-02-09 DIAGNOSIS — R059 Cough, unspecified: Secondary | ICD-10-CM

## 2013-02-09 DIAGNOSIS — R0602 Shortness of breath: Secondary | ICD-10-CM

## 2013-02-09 LAB — POCT CBC
Granulocyte percent: 66.6 %G (ref 37–80)
HCT, POC: 46.3 % (ref 37.7–47.9)
Hemoglobin: 14.3 g/dL (ref 12.2–16.2)
LYMPH, POC: 3.5 — AB (ref 0.6–3.4)
MCH: 27.6 pg (ref 27–31.2)
MCHC: 30.9 g/dL — AB (ref 31.8–35.4)
MCV: 89.4 fL (ref 80–97)
MID (cbc): 1.2 — AB (ref 0–0.9)
MPV: 9.9 fL (ref 0–99.8)
POC GRANULOCYTE: 9.4 — AB (ref 2–6.9)
POC LYMPH PERCENT: 24.8 %L (ref 10–50)
POC MID %: 8.6 % (ref 0–12)
Platelet Count, POC: 302 10*3/uL (ref 142–424)
RBC: 5.18 M/uL (ref 4.04–5.48)
RDW, POC: 15.5 %
WBC: 14.1 10*3/uL — AB (ref 4.6–10.2)

## 2013-02-09 LAB — BASIC METABOLIC PANEL
BUN: 27 mg/dL — ABNORMAL HIGH (ref 6–23)
CALCIUM: 9.4 mg/dL (ref 8.4–10.5)
CO2: 25 mEq/L (ref 19–32)
CREATININE: 1.43 mg/dL — AB (ref 0.50–1.10)
Chloride: 95 mEq/L — ABNORMAL LOW (ref 96–112)
Glucose, Bld: 102 mg/dL — ABNORMAL HIGH (ref 70–99)
Potassium: 4.3 mEq/L (ref 3.5–5.3)
Sodium: 134 mEq/L — ABNORMAL LOW (ref 135–145)

## 2013-02-09 LAB — BRAIN NATRIURETIC PEPTIDE: Brain Natriuretic Peptide: 62.5 pg/mL (ref 0.0–100.0)

## 2013-02-09 MED ORDER — DOXYCYCLINE HYCLATE 100 MG PO TABS: 100.0000 mg | ORAL_TABLET | Freq: Two times a day (BID) | ORAL | Status: DC

## 2013-02-09 NOTE — Patient Instructions (Signed)
Change your antibiotic to doxycycline.  I will speak to the cardiologist and give you a call.

## 2013-02-09 NOTE — Progress Notes (Signed)
[facility] [address] [place] [address] [phone]  Date:  02/09/2013   Name:  Crystal Kerr   DOB:  Nov 21, 1924   MRN:  [id number]  PCP:  Lamar Blinks, MD    Chief Complaint: Follow-up   History of Present Illness:  Crystal Kerr is a 80 y.o. very pleasant female patient who presents with the following:  Recently admitted from 1/24 to 1/26 with newly dx a fib/ RVR, as well as acute diastolic HF with BNP near 700.  The discharge summary states that she was converted to SR; however it appears from other notes that she actually was never converted, but was brought under better rate control and treated with anti- coagulant.  She was discharged home on increased dose of her chronic diltiazem and new eliquis.    She was seen yesterday and was feeling quite bad then with wheezing., cough, general malaise.  She saw my partner Dr. Brigitte Pulse who was concerned about recurrent failure with pulmonary edema and/ or possible pneumonia.  She started lasix and avelox for these conditions.  Also increased her dose of cardizem from 180 to 240.  She has not started the higher dose of diltiazem just yet.  Also avelox was not covered by her insurance so they just got 2 pills.  Dr. Brigitte Pulse also treated her with a duoneb.    Crystal Kerr and her daughter Crystal Kerr report that she did much better last night.  Her BNP actually came back ok at 62 so she does not appear to have CHF again.   Right now she does feel SOB with activity but is ok at rest. Any exertion will wear her out. She does admit to feeling weak and tired, but overall feels much improved compared with yesterday  No chest pain.  She does not feel any palpiations  Dr. Fletcher Anon is her primary cardiologist  Patient Active Problem List   Diagnosis Date Noted  . D-dimer, elevated 02/06/2013  . Asthma attack 02/06/2013  . Shortness of breath 02/06/2013  . Atrial fibrillation with RVR 02/04/2013  . Acute on chronic  diastolic congestive heart failure 02/04/2013  . Arthritis of spine 07/01/2012  . Intrinsic asthma 07/01/2012  . Dyspnea 04/12/2012  . PVC's (premature ventricular contractions) 04/24/2011  . Murmur, cardiac 04/24/2011    Past Medical History  Diagnosis Date  . Asthma   . PVC's (premature ventricular contractions)   . Murmur, cardiac   . Arthritis   . CHF (congestive heart failure)   . Atrial fibrillation     Past Surgical History  Procedure Laterality Date  . Appendectomy    . Cholecystectomy    . Tonsillectomy    . Oophrectomy      History  Substance Use Topics  . Smoking status: Former Smoker    Quit date: 04/13/1981  . Smokeless tobacco: Never Used  . Alcohol Use: No    History reviewed. No pertinent family history.  No Known Allergies  Medication list has been reviewed and updated.   Review of Systems:  As per HPI- otherwise negative.   Physical Examination: Filed Vitals:   02/09/13 1410  BP: 104/58  Pulse: 116  Temp: 98.2 F (36.8 C)  Resp: 16   Filed Vitals:   02/09/13 1410  Height: 5\' 2"  (1.575 m)  Weight: 180 lb (81.647 kg)   Body mass index is 32.91 kg/(m^2). Ideal Body Weight: Weight in (lb) to have BMI = 25: 136.4  GEN: WDWN, NAD, Non-toxic,  A & O x 3, obese, here with her daughter today.  Looks well HEENT: Atraumatic, Normocephalic. Neck supple. No masses, No LAD. Ears and Nose: No external deformity. CV: RRR, No M/G/R. No JVD. No thrill. No extra heart sounds. PULM: CTA B, no wheezes, crackles, rhonchi. No retractions. No resp. distress. No accessory muscle use. Lungs sound quite clear today ABD: S, NT, ND EXTR: No c/c/e NEURO Normal gait.  PSYCH: Normally interactive. Conversant. Not depressed or anxious appearing.  Calm demeanor.   EKG: continues to have atrial fibrillation with rate about 110 BPM  Results for orders placed in visit on 02/09/13  POCT CBC      Result Value Range   WBC 14.1 (*) 4.6 - 10.2 K/uL   Lymph, poc  3.5 (*) 0.6 - 3.4   POC LYMPH PERCENT 24.8  10 - 50 %L   MID (cbc) 1.2 (*) 0 - 0.9   POC MID % 8.6  0 - 12 %M   POC Granulocyte 9.4 (*) 2 - 6.9   Granulocyte percent 66.6  37 - 80 %G   RBC 5.18  4.04 - 5.48 M/uL   Hemoglobin 14.3  12.2 - 16.2 g/dL   HCT, POC 46.3  37.7 - 47.9 %   MCV 89.4  80 - 97 fL   MCH, POC 27.6  27 - 31.2 pg   MCHC 30.9 (*) 31.8 - 35.4 g/dL   RDW, POC 15.5     Platelet Count, POC 302  142 - 424 K/uL   MPV 9.9  0 - 99.8 fL    Assessment and Plan: SOB (shortness of breath) - Plan: EKG 12-Lead, EKG 01-SWFU, Basic metabolic panel  Cough - Plan: doxycycline (VIBRA-TABS) 100 MG tablet  Leukocytosis, unspecified - Plan: POCT CBC  Today Crystal Kerr is doing a good bit better. As avelox is not affordable for them will use doxycycline for atelectasias seen on her CXR.  She is not moving around much and is at risk of developing pneumonia.  Her WBC count is up a bit today and will need follow-up.   Her SOB is much better.  As yesterday's BNP was negative she does not have to continue taking lasix.  She is pleased as this was causing her some increased urination  Crystal Kerr is eager to see her primary cardiologist Dr. Fletcher Anon.  I will call his office tomorrow and request an appt for follow-up.     Signed Lamar Blinks, MD

## 2013-02-10 ENCOUNTER — Telehealth: Payer: Self-pay | Admitting: Family Medicine

## 2013-02-10 DIAGNOSIS — D72829 Elevated white blood cell count, unspecified: Secondary | ICD-10-CM

## 2013-02-10 DIAGNOSIS — N289 Disorder of kidney and ureter, unspecified: Secondary | ICD-10-CM

## 2013-02-10 NOTE — Telephone Encounter (Signed)
Called and made her an appt with cardiology on 02/15/13 at 1:15 pm with a [place] PA-C. Called and let her daughter beverly know. Also, her WBC count is up a bit and so is her renal function.  Will plan to recheck these next week.  Expect her renal function will come back as she is now off lasix.  Placed a future order for these; if possible can be drawn at her cardiology appt and results sent to me.    Results for orders placed in visit on 62/13/08  BASIC METABOLIC PANEL      Result Value Range   Sodium 134 (*) 135 - 145 mEq/L   Potassium 4.3  3.5 - 5.3 mEq/L   Chloride 95 (*) 96 - 112 mEq/L   CO2 25  19 - 32 mEq/L   Glucose, Bld 102 (*) 70 - 99 mg/dL   BUN 27 (*) 6 - 23 mg/dL   Creat 1.43 (*) 0.50 - 1.10 mg/dL   Calcium 9.4  8.4 - 10.5 mg/dL  POCT CBC      Result Value Range   WBC 14.1 (*) 4.6 - 10.2 K/uL   Lymph, poc 3.5 (*) 0.6 - 3.4   POC LYMPH PERCENT 24.8  10 - 50 %L   MID (cbc) 1.2 (*) 0 - 0.9   POC MID % 8.6  0 - 12 %M   POC Granulocyte 9.4 (*) 2 - 6.9   Granulocyte percent 66.6  37 - 80 %G   RBC 5.18  4.04 - 5.48 M/uL   Hemoglobin 14.3  12.2 - 16.2 g/dL   HCT, POC 46.3  37.7 - 47.9 %   MCV 89.4  80 - 97 fL   MCH, POC 27.6  27 - 31.2 pg   MCHC 30.9 (*) 31.8 - 35.4 g/dL   RDW, POC 15.5     Platelet Count, POC 302  142 - 424 K/uL   MPV 9.9  0 - 99.8 fL

## 2013-02-15 ENCOUNTER — Other Ambulatory Visit: Payer: Self-pay | Admitting: Internal Medicine

## 2013-02-15 ENCOUNTER — Encounter: Payer: Self-pay | Admitting: Physician Assistant

## 2013-02-15 ENCOUNTER — Ambulatory Visit (INDEPENDENT_AMBULATORY_CARE_PROVIDER_SITE_OTHER): Payer: Medicare Other | Admitting: Physician Assistant

## 2013-02-15 VITALS — BP 137/76 | HR 90 | Ht 63.0 in | Wt 181.0 lb

## 2013-02-15 DIAGNOSIS — R011 Cardiac murmur, unspecified: Secondary | ICD-10-CM

## 2013-02-15 DIAGNOSIS — I493 Ventricular premature depolarization: Secondary | ICD-10-CM

## 2013-02-15 DIAGNOSIS — R7989 Other specified abnormal findings of blood chemistry: Secondary | ICD-10-CM

## 2013-02-15 DIAGNOSIS — R0989 Other specified symptoms and signs involving the circulatory and respiratory systems: Secondary | ICD-10-CM

## 2013-02-15 DIAGNOSIS — I4949 Other premature depolarization: Secondary | ICD-10-CM | POA: Diagnosis not present

## 2013-02-15 DIAGNOSIS — R791 Abnormal coagulation profile: Secondary | ICD-10-CM

## 2013-02-15 DIAGNOSIS — R0609 Other forms of dyspnea: Secondary | ICD-10-CM | POA: Diagnosis not present

## 2013-02-15 DIAGNOSIS — I4891 Unspecified atrial fibrillation: Secondary | ICD-10-CM | POA: Diagnosis not present

## 2013-02-15 DIAGNOSIS — R0600 Dyspnea, unspecified: Secondary | ICD-10-CM

## 2013-02-15 DIAGNOSIS — J45901 Unspecified asthma with (acute) exacerbation: Secondary | ICD-10-CM

## 2013-02-15 DIAGNOSIS — I5033 Acute on chronic diastolic (congestive) heart failure: Secondary | ICD-10-CM

## 2013-02-15 DIAGNOSIS — I509 Heart failure, unspecified: Secondary | ICD-10-CM

## 2013-02-15 LAB — BASIC METABOLIC PANEL
BUN: 19 mg/dL (ref 6–23)
CO2: 23 meq/L (ref 19–32)
CREATININE: 1.1 mg/dL (ref 0.4–1.2)
Calcium: 9 mg/dL (ref 8.4–10.5)
Chloride: 104 mEq/L (ref 96–112)
GFR: 51.09 mL/min — ABNORMAL LOW (ref >60.00)
GLUCOSE: 77 mg/dL (ref 70–99)
Potassium: 3.6 mEq/L (ref 3.5–5.1)
Sodium: 137 mEq/L (ref 135–145)

## 2013-02-15 LAB — CBC WITH DIFFERENTIAL/PLATELET
BASOS ABS: 0 10*3/uL (ref 0.0–0.1)
Basophils Relative: 0.3 % (ref 0.0–3.0)
EOS PCT: 3.5 % (ref 0.0–5.0)
Eosinophils Absolute: 0.5 10*3/uL (ref 0.0–0.7)
HCT: 38 % (ref 36.0–46.0)
HEMOGLOBIN: 12.5 g/dL (ref 12.0–15.0)
LYMPHS PCT: 17.8 % (ref 12.0–46.0)
Lymphs Abs: 2.4 10*3/uL (ref 0.7–4.0)
MCHC: 32.8 g/dL (ref 30.0–36.0)
MCV: 85.2 fl (ref 78.0–100.0)
MONOS PCT: 9.6 % (ref 3.0–12.0)
Monocytes Absolute: 1.3 10*3/uL — ABNORMAL HIGH (ref 0.1–1.0)
Neutro Abs: 9.4 10*3/uL — ABNORMAL HIGH (ref 1.4–7.7)
Neutrophils Relative %: 68.8 % (ref 43.0–77.0)
Platelets: 257 10*3/uL (ref 150.0–400.0)
RBC: 4.46 Mil/uL (ref 3.87–5.11)
RDW: 14.6 % (ref 11.5–14.6)
WBC: 13.6 10*3/uL — AB (ref 4.5–10.5)

## 2013-02-15 LAB — BRAIN NATRIURETIC PEPTIDE: Pro B Natriuretic peptide (BNP): 104 pg/mL — ABNORMAL HIGH (ref 0.0–100.0)

## 2013-02-15 NOTE — Assessment & Plan Note (Addendum)
Patient's heart failure is compensated today. I will leave her off her diuretic at this time. She may need it in the future. 2 g sodium diet. She is very short of breath but has significant wheezing. Recommend pulmonary consult.

## 2013-02-15 NOTE — Assessment & Plan Note (Signed)
Patient's atrial fibrillation is rate controlled and she is on Cardizem and Eliquis. Continue current therapy. Dr.Arida can reevaluate for possible cardioversion in the future

## 2013-02-15 NOTE — Assessment & Plan Note (Signed)
Patient has significant shortness of breath and wheezing. She is on albuterol, Advair, and doxycycline. Recommend pulmonary consult to

## 2013-02-15 NOTE — Patient Instructions (Addendum)
Your physician recommends that you have lab work today : CBC,BMET,BNP  You have been referred to Pulmonary this week    Your physician recommends that you schedule a follow-up appointment in: 2 to 3 weeks with Dr. Fletcher Anon  Your physician recommends that you continue on your current medications as directed. Please refer to the Current Medication list given to you today.    2 Gram Low Sodium Diet A 2 gram sodium diet restricts the amount of sodium in the diet to no more than 2 g or 2000 mg daily. Limiting the amount of sodium is often used to help lower blood pressure. It is important if you have heart, liver, or kidney problems. Many foods contain sodium for flavor and sometimes as a preservative. When the amount of sodium in a diet needs to be low, it is important to know what to look for when choosing foods and drinks. The following includes some information and guidelines to help make it easier for you to adapt to a low sodium diet. QUICK TIPS  Do not add salt to food.  Avoid convenience items and fast food.  Choose unsalted snack foods.  Buy lower sodium products, often labeled as "lower sodium" or "no salt added."  Check food labels to learn how much sodium is in 1 serving.  When eating at a restaurant, ask that your food be prepared with less salt or none, if possible. READING FOOD LABELS FOR SODIUM INFORMATION The nutrition facts label is a good place to find how much sodium is in foods. Look for products with no more than 500 to 600 mg of sodium per meal and no more than 150 mg per serving. Remember that 2 g = 2000 mg. The food label may also list foods as:  Sodium-free: Less than 5 mg in a serving.  Very low sodium: 35 mg or less in a serving.  Low-sodium: 140 mg or less in a serving.  Light in sodium: 50% less sodium in a serving. For example, if a food that usually has 300 mg of sodium is changed to become light in sodium, it will have 150 mg of sodium.  Reduced sodium:  25% less sodium in a serving. For example, if a food that usually has 400 mg of sodium is changed to reduced sodium, it will have 300 mg of sodium. CHOOSING FOODS Grains  Avoid: Salted crackers and snack items. Some cereals, including instant hot cereals. Bread stuffing and biscuit mixes. Seasoned rice or pasta mixes.  Choose: Unsalted snack items. Low-sodium cereals, oats, puffed wheat and rice, shredded wheat. English muffins and bread. Pasta. Meats  Avoid: Salted, canned, smoked, spiced, pickled meats, including fish and poultry. Bacon, ham, sausage, cold cuts, hot dogs, anchovies.  Choose: Low-sodium canned tuna and salmon. Fresh or frozen meat, poultry, and fish. Dairy  Avoid: Processed cheese and spreads. Cottage cheese. Buttermilk and condensed milk. Regular cheese.  Choose: Milk. Low-sodium cottage cheese. Yogurt. Sour cream. Low-sodium cheese. Fruits and Vegetables  Avoid: Regular canned vegetables. Regular canned tomato sauce and paste. Frozen vegetables in sauces. Olives. Angie Fava. Relishes. Sauerkraut.  Choose: Low-sodium canned vegetables. Low-sodium tomato sauce and paste. Frozen or fresh vegetables. Fresh and frozen fruit. Condiments  Avoid: Canned and packaged gravies. Worcestershire sauce. Tartar sauce. Barbecue sauce. Soy sauce. Steak sauce. Ketchup. Onion, garlic, and table salt. Meat flavorings and tenderizers.  Choose: Fresh and dried herbs and spices. Low-sodium varieties of mustard and ketchup. Lemon juice. Tabasco sauce. Horseradish. SAMPLE 2 GRAM SODIUM MEAL  PLAN Breakfast / Sodium (mg)  1 cup low-fat milk / 390 mg  2 slices whole-wheat toast / 270 mg  1 tbs heart-healthy margarine / 153 mg  1 hard-boiled egg / 139 mg  1 small orange / 0 mg Lunch / Sodium (mg)  1 cup raw carrots / 76 mg   cup hummus / 298 mg  1 cup low-fat milk / 143 mg   cup red grapes / 2 mg  1 whole-wheat pita bread / 356 mg Dinner / Sodium (mg)  1 cup whole-wheat pasta  / 2 mg  1 cup low-sodium tomato sauce / 73 mg  3 oz lean ground beef / 57 mg  1 small side salad (1 cup raw spinach leaves,  cup cucumber,  cup yellow bell pepper) with 1 tsp olive oil and 1 tsp red wine vinegar / 25 mg Snack / Sodium (mg)  1 container low-fat vanilla yogurt / 107 mg  3 graham cracker squares / 127 mg Nutrient Analysis  Calories: 2033  Protein: 77 g  Carbohydrate: 282 g  Fat: 72 g  Sodium: 1971 mg Document Released: 12/29/2004 Document Revised: 03/23/2011 Document Reviewed: 04/01/2009 Exit[employer] Patient Information 2014 [place], [place].

## 2013-02-15 NOTE — Progress Notes (Signed)
HPI:  This is an 80 year old female patient of Dr. Fletcher Anon who was recently hospitalized with atrial fibrillation with rapid ventricular response. She was treated with Cardizem and Eliquis. She initially converted to normal sinus rhythm but prior to discharge she was back in atrial fibrillation. 2-D echo showed ejection fraction of 65-70% with grade 2 diastolic dysfunction. Troponins were negative d-dimer was elevated but CTA of the chest showed no pulmonary embolus. She did have some heart failure associated with the atrial fibrillation.  Patient has seen Dr. Edilia Bo and her diuretics were stopped because of renal insufficiency. The patient continues to feel poorly and is very short of breath with little activity. She is coughing up white mucus, wheezing, and fatigued. She denies any rapid heartbeat, chest pain, dizziness, or presyncope.   No Known Allergies  Current Outpatient Prescriptions on File Prior to Visit: albuterol (PROVENTIL HFA;VENTOLIN HFA) 108 (90 BASE) MCG/ACT inhaler, Inhale 2 puffs into the lungs every 4 (four) hours as needed for wheezing (cough, shortness of breath or wheezing.)., Disp: 1 Inhaler, Rfl: 1 apixaban (ELIQUIS) 5 MG TABS tablet, Take 1 tablet (5 mg total) by mouth 2 (two) times daily., Disp: 60 tablet, Rfl: 0 diltiazem (CARDIZEM CD) 240 MG 24 hr capsule, Take 1 capsule (240 mg total) by mouth daily., Disp: 30 capsule, Rfl: 0 doxycycline (VIBRA-TABS) 100 MG tablet, Take 1 tablet (100 mg total) by mouth 2 (two) times daily., Disp: 20 tablet, Rfl: 0 Fluticasone-Salmeterol (ADVAIR DISKUS) 250-50 MCG/DOSE AEPB, Inhale 1 puff into the lungs 2 (two) times daily. PATIENT NEEDS OFFICE VISIT FOR ADDITIONAL REFILLS, Disp: 60 each, Rfl: 0 PREMARIN vaginal cream, START WITH 0.5 GRAM APPLLIED VAGINALLY DAILY TAPER DOWN TO THREE TIMES WEEKLY AS DIRECTED, Disp: 30 g, Rfl: 1 traMADol (ULTRAM) 50 MG tablet, , Disp: , Rfl:   No current facility-administered medications on file prior to  visit.   Past Medical History:   Asthma                                                       PVC's (premature ventricular contractions)                   Murmur, cardiac                                              Arthritis                                                    CHF (congestive heart failure)                               Atrial fibrillation                                         Past Surgical History:   APPENDECTOMY  CHOLECYSTECTOMY                                               TONSILLECTOMY                                                 oophrectomy                                                  No family history on file.   Social History   Marital Status: Unknown             Spouse Name:                      Years of Education:                 Number of children:             Occupational History   None on file  Social History Main Topics   Smoking Status: Former Smoker                   Packs/Day: 0.00  Years:           Quit date: 04/13/1981   Smokeless Status: Never Used                       Alcohol Use: No             Drug Use: No             Sexual Activity: No                 Other Topics            Concern   None on file  Social History Narrative   None on file    ROS: See history of present illness otherwise negative   PHYSICAL EXAM: Well-nournished, in no acute distress. Neck: No JVD, HJR, Bruit, or thyroid enlargement  Lungs: Decreased breath sounds with significant inspiratory and expiratory wheezing and scattered rhonchi.  Cardiovascular: Irregular irregular, PMI not displaced, 1/6 systolic murmur at the left sternal border, no gallops, bruit, thrill, or heave.  Abdomen: BS normal. Soft without organomegaly, masses, lesions or tenderness.  Extremities: without cyanosis, clubbing or edema. Good distal pulses bilateral  SKin: Warm, no lesions or rashes   Musculoskeletal: No  deformities  Neuro: no focal signs  BP 137/76  Pulse 90  Ht 5\' 3"  (1.6 m)  Wt 181 lb (82.101 kg)  BMI 32.07 kg/m2   EKG: Atrial fibrillation at 89 beats per minute with PVC, low-voltage, no acute change  ECHO 1/25 - Upper normal LV wall thickness with LVEF 24-23%, grade 2 diastolic dysfunction. Moderate biatrial enlargement. Trivial mitral and tricuspid regurgitation. Unable to assess PASP. The atrial septum is thickened - consider lipomatous hypertrophy, not well defined. Also apparent prominent epicardial fat pad, circumferential. If there is any clinical concern for other pericardial process, could consider CT imaging to further define.

## 2013-02-17 ENCOUNTER — Encounter: Payer: Self-pay | Admitting: Internal Medicine

## 2013-02-17 ENCOUNTER — Ambulatory Visit (INDEPENDENT_AMBULATORY_CARE_PROVIDER_SITE_OTHER): Payer: Medicare Other | Admitting: Internal Medicine

## 2013-02-17 VITALS — BP 112/64 | HR 108 | Temp 97.8°F | Ht 63.0 in | Wt 183.0 lb

## 2013-02-17 DIAGNOSIS — J45909 Unspecified asthma, uncomplicated: Secondary | ICD-10-CM

## 2013-02-17 MED ORDER — SYMBICORT 160-4.5 MCG/ACT IN AERO: INHALATION_SPRAY | RESPIRATORY_TRACT | Status: DC

## 2013-02-17 MED ORDER — PREDNISONE 10 MG PO TABS
ORAL_TABLET | ORAL | Status: DC
Start: 2013-02-17 — End: 2013-03-04

## 2013-02-17 MED ORDER — ALBUTEROL SULFATE HFA 108 (90 BASE) MCG/ACT IN AERS: 2.0000 | INHALATION_SPRAY | RESPIRATORY_TRACT | Status: DC | PRN

## 2013-02-17 NOTE — Progress Notes (Signed)
   Subjective:    Patient ID: Crystal Kerr, female    DOB: 1924-02-11 MRN: [id number]  HPI   66 yowf quit smoking 1983 prev eval Dr Joya Gaskins dx with asthma  Referred back to pulmonary clinic 02/17/13 by Dr Rod Can.   02/17/2013 1st [facility] office visit/ [name] cc new sob/cough since Mid Dec 2014 with white mucus worse day than night while on advair and better p albuterol for up to 6 h day = night s purulent sputum  No obvious day to day or daytime variabilty or assoc chronic cough or cp or chest tightness, subjective wheeze overt sinus or hb symptoms. No unusual exp hx or h/o childhood pna/ asthma or knowledge of premature birth.  Sleeping ok without nocturnal  or early am exacerbation  of respiratory  c/o's or need for noct saba. Also denies any obvious fluctuation of symptoms with weather or environmental changes or other aggravating or alleviating factors except as outlined above   Current Medications, Allergies, Complete Past Medical History, Past Surgical History, Family History, and Social History were reviewed in [employer] record.           Review of Systems  Constitutional: Negative for fever, chills and unexpected weight change.  HENT: Negative for congestion, dental problem, ear pain, nosebleeds, postnasal drip, rhinorrhea, sinus pressure, sneezing, sore throat, trouble swallowing and voice change.   Eyes: Negative for visual disturbance.  Respiratory: Positive for cough and shortness of breath. Negative for choking.   Cardiovascular: Negative for chest pain and leg swelling.  Gastrointestinal: Negative for vomiting, abdominal pain and diarrhea.  Genitourinary: Negative for difficulty urinating.  Musculoskeletal: Positive for arthralgias.  Skin: Negative for rash.  Neurological: Negative for tremors, syncope and headaches.  Hematological: Does not bruise/bleed easily.       Objective:   Physical Exam  Wt Readings from Last 3  Encounters:  02/17/13 183 lb (83.008 kg)  02/15/13 181 lb (82.101 kg)  02/09/13 180 lb (81.647 kg)     amb wf nad  HEENT: nl dentition, turbinates, and orophanx. Nl external ear canals without cough reflex   NECK :  without JVD/Nodes/TM/ nl carotid upstrokes bilaterally   LUNGS: no acc muscle use, clear to A and P bilaterally without cough on insp or exp maneuvers   CV:  RRR  no s3 or murmur or increase in P2, no edema   ABD:  soft and nontender with nl excursion in the supine position. No bruits or organomegaly, bowel sounds nl  MS:  warm without deformities, calf tenderness, cyanosis or clubbing  SKIN: warm and dry without lesions    NEURO:  alert, approp, no deficits   cxr 02/08/13 Mildly increased density at the right lung base suggests progressive  atelectasis. There is no alveolar pneumonia nor pleural effusion nor  evidence of CHF.         Assessment & Plan:

## 2013-02-17 NOTE — Patient Instructions (Addendum)
Symbicort 160 Take 2 puffs first thing in am and then another 2 puffs about 12 hours later.    Only use your albuterol (proair) as a rescue medication to be used if you can't catch your breath by resting or doing a relaxed purse lip breathing pattern.  - The less you use it, the better it will work when you need it. - Ok to use up to 2 puffs  every 4 hours if you must but call for immediate appointment if use goes up over your usual need - Don't leave home without it !!  (think of it like the spare tire for your car)   Prednisone 10 mg take  4 each am x 2 days,   2 each am x 2 days,  1 each am x 2 days and stop   Try prilosec 20mg   Take 30-60 min before first meal of the day and Pepcid 20 mg one bedtime until cough is completely gone for at least a week without the need for cough suppression  GERD (REFLUX)  is an extremely common cause of respiratory symptoms, many times with no significant heartburn at all.    It can be treated with medication, but also with lifestyle changes including avoidance of late meals, excessive alcohol, smoking cessation, and avoid fatty foods, chocolate, peppermint, colas, red wine, and acidic juices such as orange juice.  NO MINT OR MENTHOL PRODUCTS SO NO COUGH DROPS  USE SUGARLESS CANDY INSTEAD (jolley ranchers or Stover's)  NO OIL BASED VITAMINS - use powdered substitutes.   Please schedule a follow up office visit in 2  weeks, sooner if needed to see Tammy NP re insurance issue

## 2013-02-18 ENCOUNTER — Encounter: Payer: Self-pay | Admitting: Internal Medicine

## 2013-02-18 NOTE — Assessment & Plan Note (Addendum)
DDX of  difficult airways managment all start with A and  include Adherence, Ace Inhibitors, Acid Reflux, Active Sinus Disease, Alpha 1 Antitripsin deficiency, Anxiety masquerading as Airways dz,  ABPA,  allergy(esp in young), Aspiration (esp in elderly), Adverse effects of DPI,  Active smokers, plus two Bs  = Bronchiectasis and Beta blocker use..and one C= CHF  Adherence is always the initial "prime suspect" and is a multilayered concern that requires a "trust but verify" approach in every patient - starting with knowing how to use medications, especially inhalers, correctly, keeping up with refills and understanding the fundamental difference between maintenance and prns vs those medications only taken for a very short course and then stopped and not refilled.  -The proper method of use, as well as anticipated side effects, of a metered-dose inhaler are discussed and demonstrated to the patient. Improved effectiveness after extensive coaching during this visit to a level of approximately  75%   ? Adverse effect of dpi, esp advair > try symbicort 160 2bid  ? Acid (or non-acid) GERD > always difficult to exclude as up to 75% of pts in some series report no assoc GI/ Heartburn symptoms> rec max (24h)  acid suppression and diet restrictions/ reviewed and instructions given in writing.   ? chf > very unlikely with bnp = 104 02/15/13     Each maintenance medication was reviewed in detail including most importantly the difference between maintenance and as needed and under what circumstances the prns are to be used.  Please see instructions for details which were reviewed in writing and the patient given a copy.

## 2013-02-27 DIAGNOSIS — H9319 Tinnitus, unspecified ear: Secondary | ICD-10-CM | POA: Diagnosis not present

## 2013-02-27 DIAGNOSIS — H903 Sensorineural hearing loss, bilateral: Secondary | ICD-10-CM | POA: Diagnosis not present

## 2013-03-03 ENCOUNTER — Ambulatory Visit: Payer: Medicare Other | Admitting: Adult Health

## 2013-03-04 ENCOUNTER — Encounter (HOSPITAL_COMMUNITY): Payer: Self-pay | Admitting: Emergency Medicine

## 2013-03-04 ENCOUNTER — Emergency Department (HOSPITAL_COMMUNITY): Payer: Medicare Other

## 2013-03-04 ENCOUNTER — Inpatient Hospital Stay (HOSPITAL_COMMUNITY)
Admission: EM | Admit: 2013-03-04 | Discharge: 2013-03-10 | DRG: 309 | Disposition: A | Discharge status: Home / Self Care | Payer: Medicare Other | Attending: Family Medicine | Admitting: Family Medicine

## 2013-03-04 DIAGNOSIS — R5381 Other malaise: Secondary | ICD-10-CM | POA: Diagnosis not present

## 2013-03-04 DIAGNOSIS — M129 Arthropathy, unspecified: Secondary | ICD-10-CM | POA: Diagnosis present

## 2013-03-04 DIAGNOSIS — E876 Hypokalemia: Secondary | ICD-10-CM | POA: Diagnosis present

## 2013-03-04 DIAGNOSIS — J45909 Unspecified asthma, uncomplicated: Secondary | ICD-10-CM | POA: Diagnosis present

## 2013-03-04 DIAGNOSIS — J45901 Unspecified asthma with (acute) exacerbation: Secondary | ICD-10-CM | POA: Diagnosis present

## 2013-03-04 DIAGNOSIS — I509 Heart failure, unspecified: Secondary | ICD-10-CM | POA: Diagnosis present

## 2013-03-04 DIAGNOSIS — R197 Diarrhea, unspecified: Secondary | ICD-10-CM | POA: Diagnosis not present

## 2013-03-04 DIAGNOSIS — R000 Tachycardia, unspecified: Secondary | ICD-10-CM | POA: Diagnosis present

## 2013-03-04 DIAGNOSIS — I4891 Unspecified atrial fibrillation: Secondary | ICD-10-CM | POA: Diagnosis not present

## 2013-03-04 DIAGNOSIS — I5033 Acute on chronic diastolic (congestive) heart failure: Secondary | ICD-10-CM | POA: Diagnosis not present

## 2013-03-04 DIAGNOSIS — J811 Chronic pulmonary edema: Secondary | ICD-10-CM | POA: Diagnosis not present

## 2013-03-04 DIAGNOSIS — Z87891 Personal history of nicotine dependence: Secondary | ICD-10-CM | POA: Diagnosis not present

## 2013-03-04 DIAGNOSIS — A088 Other specified intestinal infections: Secondary | ICD-10-CM | POA: Diagnosis present

## 2013-03-04 DIAGNOSIS — E860 Dehydration: Secondary | ICD-10-CM | POA: Diagnosis present

## 2013-03-04 DIAGNOSIS — R0602 Shortness of breath: Secondary | ICD-10-CM

## 2013-03-04 DIAGNOSIS — N179 Acute kidney failure, unspecified: Secondary | ICD-10-CM | POA: Diagnosis not present

## 2013-03-04 DIAGNOSIS — R6510 Systemic inflammatory response syndrome (SIRS) of non-infectious origin without acute organ dysfunction: Secondary | ICD-10-CM | POA: Diagnosis not present

## 2013-03-04 DIAGNOSIS — A419 Sepsis, unspecified organism: Secondary | ICD-10-CM | POA: Diagnosis not present

## 2013-03-04 DIAGNOSIS — R112 Nausea with vomiting, unspecified: Secondary | ICD-10-CM | POA: Diagnosis not present

## 2013-03-04 DIAGNOSIS — I4949 Other premature depolarization: Secondary | ICD-10-CM | POA: Diagnosis present

## 2013-03-04 DIAGNOSIS — R0989 Other specified symptoms and signs involving the circulatory and respiratory systems: Secondary | ICD-10-CM | POA: Diagnosis not present

## 2013-03-04 DIAGNOSIS — I5032 Chronic diastolic (congestive) heart failure: Secondary | ICD-10-CM | POA: Diagnosis present

## 2013-03-04 DIAGNOSIS — R5383 Other fatigue: Secondary | ICD-10-CM | POA: Diagnosis not present

## 2013-03-04 DIAGNOSIS — R0600 Dyspnea, unspecified: Secondary | ICD-10-CM

## 2013-03-04 DIAGNOSIS — Z6832 Body mass index (BMI) 32.0-32.9, adult: Secondary | ICD-10-CM

## 2013-03-04 DIAGNOSIS — R404 Transient alteration of awareness: Secondary | ICD-10-CM | POA: Diagnosis not present

## 2013-03-04 DIAGNOSIS — R0609 Other forms of dyspnea: Secondary | ICD-10-CM | POA: Diagnosis present

## 2013-03-04 LAB — COMPREHENSIVE METABOLIC PANEL
ALBUMIN: 3.4 g/dL — AB (ref 3.5–5.2)
ALT: 39 U/L — AB (ref 0–35)
AST: 37 U/L (ref 0–37)
Alkaline Phosphatase: 108 U/L (ref 39–117)
BUN: 50 mg/dL — ABNORMAL HIGH (ref 6–23)
CALCIUM: 9.1 mg/dL (ref 8.4–10.5)
CO2: 16 mEq/L — ABNORMAL LOW (ref 19–32)
CREATININE: 2.12 mg/dL — AB (ref 0.50–1.10)
Chloride: 100 mEq/L (ref 96–112)
GFR calc Af Amer: 23 mL/min — ABNORMAL LOW (ref >90)
GFR calc non Af Amer: 20 mL/min — ABNORMAL LOW (ref >90)
Glucose, Bld: 158 mg/dL — ABNORMAL HIGH (ref 70–99)
Potassium: 4.1 mEq/L (ref 3.7–5.3)
SODIUM: 138 meq/L (ref 137–147)
TOTAL PROTEIN: 7.5 g/dL (ref 6.0–8.3)
Total Bilirubin: 0.4 mg/dL (ref 0.3–1.2)

## 2013-03-04 LAB — URINALYSIS, ROUTINE W REFLEX MICROSCOPIC
Glucose, UA: NEGATIVE mg/dL
HGB URINE DIPSTICK: NEGATIVE
Ketones, ur: NEGATIVE mg/dL
Leukocytes, UA: NEGATIVE
Nitrite: NEGATIVE
PH: 5 (ref 5.0–8.0)
Protein, ur: NEGATIVE mg/dL
SPECIFIC GRAVITY, URINE: 1.026 (ref 1.005–1.030)
Urobilinogen, UA: 0.2 mg/dL (ref 0.0–1.0)

## 2013-03-04 LAB — CBC WITH DIFFERENTIAL/PLATELET
BASOS ABS: 0 10*3/uL (ref 0.0–0.1)
BASOS PCT: 0 % (ref 0–1)
EOS PCT: 0 % (ref 0–5)
Eosinophils Absolute: 0 10*3/uL (ref 0.0–0.7)
HEMATOCRIT: 46.2 % — AB (ref 36.0–46.0)
Hemoglobin: 15.7 g/dL — ABNORMAL HIGH (ref 12.0–15.0)
Lymphocytes Relative: 5 % — ABNORMAL LOW (ref 12–46)
Lymphs Abs: 0.7 10*3/uL (ref 0.7–4.0)
MCH: 28.7 pg (ref 26.0–34.0)
MCHC: 34 g/dL (ref 30.0–36.0)
MCV: 84.5 fL (ref 78.0–100.0)
MONO ABS: 1.2 10*3/uL — AB (ref 0.1–1.0)
Monocytes Relative: 9 % (ref 3–12)
Neutro Abs: 11.4 10*3/uL — ABNORMAL HIGH (ref 1.7–7.7)
Neutrophils Relative %: 85 % — ABNORMAL HIGH (ref 43–77)
PLATELETS: 281 10*3/uL (ref 150–400)
RBC: 5.47 MIL/uL — ABNORMAL HIGH (ref 3.87–5.11)
RDW: 15 % (ref 11.5–15.5)
WBC: 13.3 10*3/uL — AB (ref 4.0–10.5)

## 2013-03-04 LAB — LACTIC ACID, PLASMA: Lactic Acid, Venous: 1.9 mmol/L (ref 0.5–2.2)

## 2013-03-04 LAB — TROPONIN I: Troponin I: <0.3 ng/mL (ref <0.30)

## 2013-03-04 MED ORDER — VANCOMYCIN HCL IN DEXTROSE 1-5 GM/200ML-% IV SOLN
1000.0000 mg | INTRAVENOUS | Status: AC
  Administered 2013-03-04: 1000 mg via INTRAVENOUS
  Filled 2013-03-04: qty 200

## 2013-03-04 MED ORDER — ONDANSETRON HCL 4 MG/2ML IJ SOLN: 4.0000 mg | Freq: Four times a day (QID) | INTRAMUSCULAR | Status: DC | PRN

## 2013-03-04 MED ORDER — AMIODARONE LOAD VIA INFUSION
150.0000 mg | Freq: Once | INTRAVENOUS | Status: AC
  Administered 2013-03-04: 150 mg via INTRAVENOUS
  Filled 2013-03-04: qty 83.34

## 2013-03-04 MED ORDER — SODIUM CHLORIDE 0.9 % IV SOLN
INTRAVENOUS | Status: DC
  Administered 2013-03-04: 18:00:00 via INTRAVENOUS

## 2013-03-04 MED ORDER — ACETAMINOPHEN 325 MG PO TABS
650.0000 mg | ORAL_TABLET | Freq: Four times a day (QID) | ORAL | Status: DC | PRN
  Administered 2013-03-08 – 2013-03-10 (×2): 650 mg via ORAL
  Filled 2013-03-04 (×3): qty 2

## 2013-03-04 MED ORDER — SODIUM CHLORIDE 0.9 % IV BOLUS (SEPSIS)
1000.0000 mL | Freq: Once | INTRAVENOUS | Status: AC
  Administered 2013-03-04: 1000 mL via INTRAVENOUS

## 2013-03-04 MED ORDER — APIXABAN 5 MG PO TABS: 5.0000 mg | ORAL_TABLET | Freq: Two times a day (BID) | ORAL | Status: DC

## 2013-03-04 MED ORDER — BUDESONIDE-FORMOTEROL FUMARATE 160-4.5 MCG/ACT IN AERO
2.0000 | INHALATION_SPRAY | Freq: Two times a day (BID) | RESPIRATORY_TRACT | Status: DC
  Administered 2013-03-04 – 2013-03-10 (×11): 2 via RESPIRATORY_TRACT
  Filled 2013-03-04: qty 6

## 2013-03-04 MED ORDER — VANCOMYCIN HCL IN DEXTROSE 1-5 GM/200ML-% IV SOLN
1000.0000 mg | INTRAVENOUS | Status: DC
  Filled 2013-03-04: qty 200

## 2013-03-04 MED ORDER — PIPERACILLIN-TAZOBACTAM 3.375 G IVPB 30 MIN
3.3750 g | Freq: Once | INTRAVENOUS | Status: AC
  Administered 2013-03-04: 3.375 g via INTRAVENOUS
  Filled 2013-03-04: qty 50

## 2013-03-04 MED ORDER — AMIODARONE HCL IN DEXTROSE 360-4.14 MG/200ML-% IV SOLN
60.0000 mg/h | INTRAVENOUS | Status: AC
  Administered 2013-03-04 (×2): 60 mg/h via INTRAVENOUS
  Filled 2013-03-04 (×2): qty 200

## 2013-03-04 MED ORDER — ONDANSETRON HCL 4 MG/2ML IJ SOLN
4.0000 mg | Freq: Once | INTRAMUSCULAR | Status: AC
  Administered 2013-03-04: 4 mg via INTRAVENOUS
  Filled 2013-03-04: qty 2

## 2013-03-04 MED ORDER — PANTOPRAZOLE SODIUM 40 MG PO TBEC
40.0000 mg | DELAYED_RELEASE_TABLET | Freq: Every day | ORAL | Status: DC
  Administered 2013-03-04 – 2013-03-10 (×7): 40 mg via ORAL
  Filled 2013-03-04 (×6): qty 1

## 2013-03-04 MED ORDER — MONTELUKAST SODIUM 10 MG PO TABS
10.0000 mg | ORAL_TABLET | Freq: Every day | ORAL | Status: DC
  Administered 2013-03-04 – 2013-03-09 (×6): 10 mg via ORAL
  Filled 2013-03-04 (×7): qty 1

## 2013-03-04 MED ORDER — DILTIAZEM HCL 100 MG IV SOLR: 5.0000 mg/h | Freq: Once | INTRAVENOUS | Status: DC

## 2013-03-04 MED ORDER — AMIODARONE HCL IN DEXTROSE 360-4.14 MG/200ML-% IV SOLN
30.0000 mg/h | INTRAVENOUS | Status: DC
  Administered 2013-03-05 – 2013-03-08 (×6): 30 mg/h via INTRAVENOUS
  Administered 2013-03-08: 150 mg/h via INTRAVENOUS
  Filled 2013-03-04 (×16): qty 200

## 2013-03-04 MED ORDER — ONDANSETRON HCL 4 MG PO TABS: 4.0000 mg | ORAL_TABLET | Freq: Four times a day (QID) | ORAL | Status: DC | PRN

## 2013-03-04 MED ORDER — APIXABAN 2.5 MG PO TABS
2.5000 mg | ORAL_TABLET | Freq: Two times a day (BID) | ORAL | Status: DC
  Administered 2013-03-04 – 2013-03-07 (×6): 2.5 mg via ORAL
  Filled 2013-03-04 (×8): qty 1

## 2013-03-04 MED ORDER — SODIUM CHLORIDE 0.9 % IV BOLUS (SEPSIS): 500.0000 mL | Freq: Once | INTRAVENOUS | Status: DC

## 2013-03-04 MED ORDER — VANCOMYCIN HCL 10 G IV SOLR: 1.0000 g | Freq: Once | INTRAVENOUS | Status: DC

## 2013-03-04 MED ORDER — ACETAMINOPHEN 650 MG RE SUPP: 650.0000 mg | Freq: Four times a day (QID) | RECTAL | Status: DC | PRN

## 2013-03-04 MED ORDER — SODIUM CHLORIDE 0.9 % IJ SOLN
3.0000 mL | Freq: Two times a day (BID) | INTRAMUSCULAR | Status: DC
  Administered 2013-03-05 – 2013-03-09 (×7): 3 mL via INTRAVENOUS

## 2013-03-04 MED ORDER — ACETAMINOPHEN 325 MG PO TABS
650.0000 mg | ORAL_TABLET | Freq: Once | ORAL | Status: AC
  Administered 2013-03-04: 650 mg via ORAL
  Filled 2013-03-04: qty 2

## 2013-03-04 MED ORDER — DILTIAZEM HCL 25 MG/5ML IV SOLN
10.0000 mg | Freq: Once | INTRAVENOUS | Status: AC
  Administered 2013-03-04: 10 mg via INTRAVENOUS
  Filled 2013-03-04: qty 5

## 2013-03-04 MED ORDER — PIPERACILLIN-TAZOBACTAM IN DEX 2-0.25 GM/50ML IV SOLN
2.2500 g | Freq: Four times a day (QID) | INTRAVENOUS | Status: DC
  Administered 2013-03-04 – 2013-03-06 (×7): 2.25 g via INTRAVENOUS
  Filled 2013-03-04 (×9): qty 50

## 2013-03-04 MED ORDER — DILTIAZEM HCL 25 MG/5ML IV SOLN
10.0000 mg | Freq: Once | INTRAVENOUS | Status: AC
  Administered 2013-03-04: 10 mg via INTRAVENOUS

## 2013-03-04 NOTE — Progress Notes (Signed)
ANTIBIOTIC CONSULT NOTE - INITIAL  Pharmacy Consult for vancomycin and Zosyn Indication: rule out pneumonia, SIRS  No Known Allergies  Patient Measurements: Height: 5\' 3"  (160 cm) Weight: 183 lb (83.008 kg) IBW/kg (Calculated) : 52.4  Vital Signs: Temp: 99.2 F (37.3 C) (02/21 1431) Temp src: Oral (02/21 1431) BP: 103/50 mmHg (02/21 1521) Pulse Rate: 111 (02/21 1521) Intake/Output from previous day:   Intake/Output from this shift:    Labs:  Recent Labs  03/04/13 1132 03/04/13 1220  WBC  --  13.3*  HGB  --  15.7*  PLT  --  281  CREATININE 2.12*  --    Estimated Creatinine Clearance: 19.4 ml/min (by C-G formula based on Cr of 2.12). No results found for this basename: VANCOTROUGH, Corlis Leak, VANCORANDOM, [place], GENTPEAK, [place], Winter, TOBRAPEAK, TOBRARND, AMIKACINPEAK, AMIKACINTROU, AMIKACIN,  in the last 72 hours   Microbiology: Recent Results (from the past 720 hour(s))  MRSA PCR SCREENING     Status: None   Collection Time    02/04/13  3:22 PM      Result Value Ref Range Status   MRSA by PCR NEGATIVE  NEGATIVE Final   Comment:            The GeneXpert MRSA Assay (FDA     approved for NASAL specimens     only), is one component of a     comprehensive MRSA colonization     surveillance program. It is not     intended to diagnose MRSA     infection nor to guide or     monitor treatment for     MRSA infections.    Medical History: Past Medical History  Diagnosis Date  . Asthma   . PVC's (premature ventricular contractions)   . Arthritis   . CHF (congestive heart failure)   . Atrial fibrillation     Medications:  Prescriptions prior to admission  Medication Sig Dispense Refill  . albuterol (PROAIR HFA) 108 (90 BASE) MCG/ACT inhaler Inhale 2 puffs into the lungs every 4 (four) hours as needed for wheezing or shortness of breath. 2 puffs every 4 hours as needed only  if your can't catch your breath  1 Inhaler  11  . apixaban (ELIQUIS) 5  MG TABS tablet Take 1 tablet (5 mg total) by mouth 2 (two) times daily.  60 tablet  0  . diltiazem (CARDIZEM CD) 240 MG 24 hr capsule Take 1 capsule (240 mg total) by mouth daily.  30 capsule  0  . loperamide (IMODIUM A-D) 2 MG tablet Take 2 mg by mouth 4 (four) times daily as needed for diarrhea or loose stools.      . montelukast (SINGULAIR) 10 MG tablet Take 10 mg by mouth at bedtime.      Marland Kitchen omeprazole (PRILOSEC) 20 MG capsule Take 20 mg by mouth daily.      . SYMBICORT 160-4.5 MCG/ACT inhaler Take 2 puffs first thing in am and then another 2 puffs about 12 hours later.       Assessment: 80 y/o female who presents to the ED with N/V/D. She is febrile and was found to be in Afib. Pharmacy consulted to begin vancomycin and Zosyn empircally. Of note, patient had an admission in January. Patient has an AKI with SCr 2.12. WBC are elevated and blood cultures are pending. Vancomycin 1000 mg IV given at 14:51 and Zosyn 3.375 g IV was given at 13:23.  Goal of Therapy:  Vancomycin trough level 15-20 mcg/ml  Plan:  -Zosyn 2.25 g IV q6h -Vancomycin 1000 mg IV q48h -Monitor renal function and adjust dosing as needed -Follow-up culture data and clinical progress  [facility], Pharm.D., BCPS Clinical Pharmacist Pager: [phone] 03/04/2013 4:01 PM

## 2013-03-04 NOTE — ED Notes (Signed)
PT lives with daughter at this time. Daughter reports Pt started having N/V last night and woke up to day unable to eat or drink fluids. Pt HR 140 per EMS with Hx of A-Fib.

## 2013-03-04 NOTE — Consult Note (Signed)
HPI: 80 year old female for evaluation of atrial fibrillation. Patient recently admitted with new-onset atrial fibrillation in January of 2015. Echocardiogram in January of 2015 showed an ejection fraction of 93-81%, grade 2 diastolic dysfunction, moderate biatrial enlargement and trace mitral regurgitation. TSH was normal. The patient was treated with Cardizem and started on apixaban. She converted to sinus rhythm but atrial fibrillation recurred. She was treated with rate control and anticoagulation. Note chest CT in January of 2015 showed cardiomegaly and bilateral pulmonary interstitial prominence suggesting CHF. Small pericardial effusion. Patient was seen in followup on February 4. She continued to describe dyspnea on exertion. Yesterday she had significant diarrhea and nausea. She has not had chest pain or syncope and no bleeding. She has had mild palpitations. She has not taken her apixaban since Friday morning.   (Not in a hospital admission)  No Known Allergies  Past Medical History  Diagnosis Date  . Asthma   . PVC's (premature ventricular contractions)   . Arthritis   . CHF (congestive heart failure)   . Atrial fibrillation     Past Surgical History  Procedure Laterality Date  . Appendectomy    . Cholecystectomy    . Tonsillectomy    . Oophrectomy      History   Social History  . Marital Status: Unknown    Spouse Name: N/A    Number of Children: N/A  . Years of Education: N/A   Occupational History  . Retired    Social History Main Topics  . Smoking status: Former Smoker -- 1.00 packs/day for 20 years    Types: Cigarettes    Quit date: 04/13/1981  . Smokeless tobacco: Never Used  . Alcohol Use: Yes     Comment: Occasional  . Drug Use: No  . Sexual Activity: No   Other Topics Concern  . Not on file   Social History Narrative  . No narrative on file    Family History  Problem Relation Age of Onset  . Heart disease      No family history     ROS:  Recent diarrhea, mildly productive cough as well as dyspnea on exertion but no fevers or chills, hemoptysis, dysphasia, odynophagia, melena, hematochezia, dysuria, hematuria, rash, seizure activity, PND, pedal edema, claudication. Remaining systems are negative.  Physical Exam:   Blood pressure 93/74, pulse 106, temperature 99.2 F (37.3 C), temperature source Oral, resp. rate 25, SpO2 91.00%.  General:  Well developed/obese in NAD Skin warm/dry Patient not depressed No peripheral clubbing Back-normal HEENT-normal/normal eyelids Neck supple/normal carotid upstroke bilaterally; no bruits; no JVD; no thyromegaly chest - expiratory wheeze CV - irregular/normal S1 and S2; no murmurs, rubs or gallops;  PMI nondisplaced Abdomen -NT/ND, no HSM, no mass, + bowel sounds, no bruit 2+ femoral pulses, no bruits Ext-no edema, chords, 2+ DP Neuro-grossly nonfocal  ECG Feb 15 2013-atrial fibrillation with PVCs or aberrantly conducted beats.  Results for orders placed during the hospital encounter of 03/04/13 (from the past 48 hour(s))  TROPONIN I     Status: None   Collection Time    03/04/13 11:32 AM      Result Value Ref Range   Troponin I <0.30  <0.30 ng/mL   Comment:            Due to the release kinetics of cTnI,     a negative result within the first hours     of the onset of symptoms does not rule out  myocardial infarction with certainty.     If myocardial infarction is still suspected,     repeat the test at appropriate intervals.  COMPREHENSIVE METABOLIC PANEL     Status: Abnormal   Collection Time    03/04/13 11:32 AM      Result Value Ref Range   Sodium 138  137 - 147 mEq/L   Potassium 4.1  3.7 - 5.3 mEq/L   Chloride 100  96 - 112 mEq/L   CO2 16 (*) 19 - 32 mEq/L   Glucose, Bld 158 (*) 70 - 99 mg/dL   BUN 50 (*) 6 - 23 mg/dL   Creatinine, Ser 2.12 (*) 0.50 - 1.10 mg/dL   Calcium 9.1  8.4 - 10.5 mg/dL   Total Protein 7.5  6.0 - 8.3 g/dL   Albumin 3.4 (*) 3.5  - 5.2 g/dL   AST 37  0 - 37 U/L   ALT 39 (*) 0 - 35 U/L   Alkaline Phosphatase 108  39 - 117 U/L   Total Bilirubin 0.4  0.3 - 1.2 mg/dL   GFR calc non Af Amer 20 (*) >90 mL/min   GFR calc Af Amer 23 (*) >90 mL/min   Comment: (NOTE)     The eGFR has been calculated using the CKD EPI equation.     This calculation has not been validated in all clinical situations.     eGFR's persistently <90 mL/min signify possible Chronic Kidney     Disease.  LACTIC ACID, PLASMA     Status: None   Collection Time    03/04/13 12:19 PM      Result Value Ref Range   Lactic Acid, Venous 1.9  0.5 - 2.2 mmol/L  CBC WITH DIFFERENTIAL     Status: Abnormal   Collection Time    03/04/13 12:20 PM      Result Value Ref Range   WBC 13.3 (*) 4.0 - 10.5 K/uL   RBC 5.47 (*) 3.87 - 5.11 MIL/uL   Hemoglobin 15.7 (*) 12.0 - 15.0 g/dL   HCT 46.2 (*) 36.0 - 46.0 %   MCV 84.5  78.0 - 100.0 fL   MCH 28.7  26.0 - 34.0 pg   MCHC 34.0  30.0 - 36.0 g/dL   RDW 15.0  11.5 - 15.5 %   Platelets 281  150 - 400 K/uL   Neutrophils Relative % 85 (*) 43 - 77 %   Neutro Abs 11.4 (*) 1.7 - 7.7 K/uL   Lymphocytes Relative 5 (*) 12 - 46 %   Lymphs Abs 0.7  0.7 - 4.0 K/uL   Monocytes Relative 9  3 - 12 %   Monocytes Absolute 1.2 (*) 0.1 - 1.0 K/uL   Eosinophils Relative 0  0 - 5 %   Eosinophils Absolute 0.0  0.0 - 0.7 K/uL   Basophils Relative 0  0 - 1 %   Basophils Absolute 0.0  0.0 - 0.1 K/uL  URINALYSIS, ROUTINE W REFLEX MICROSCOPIC     Status: Abnormal   Collection Time    03/04/13 12:48 PM      Result Value Ref Range   Color, Urine AMBER (*) YELLOW   Comment: BIOCHEMICALS MAY BE AFFECTED BY COLOR   APPearance CLOUDY (*) CLEAR   Specific Gravity, Urine 1.026  1.005 - 1.030   pH 5.0  5.0 - 8.0   Glucose, UA NEGATIVE  NEGATIVE mg/dL   Hgb urine dipstick NEGATIVE  NEGATIVE   Bilirubin Urine SMALL (*) NEGATIVE  Ketones, ur NEGATIVE  NEGATIVE mg/dL   Protein, ur NEGATIVE  NEGATIVE mg/dL   Urobilinogen, UA 0.2  0.0 - 1.0  mg/dL   Nitrite NEGATIVE  NEGATIVE   Leukocytes, UA NEGATIVE  NEGATIVE   Comment: MICROSCOPIC NOT DONE ON URINES WITH NEGATIVE PROTEIN, BLOOD, LEUKOCYTES, NITRITE, OR GLUCOSE <1000 mg/dL.    Dg Chest Portable 1 View  03/04/2013   CLINICAL DATA:  Atrial fibrillation. Tachycardia with weakness and shortness of breath.  EXAM: PORTABLE CHEST - 1 VIEW  COMPARISON:  02/08/2013  FINDINGS: The cardiac silhouette is enlarged. Thoracic aortic calcification is noted. There is new pulmonary vascular congestion with mild bilateral interstitial opacity. Additional opacities are present in both lung bases. Small bilateral pleural effusions are questioned. No pneumothorax is seen. Right glenohumeral osteoarthrosis is noted. Thoracic dextroscoliosis is also noted.  IMPRESSION: Cardiomegaly and mild pulmonary edema. Superimposed atelectasis or infection in the lung bases is not excluded.   Electronically Signed   By: Logan Bores   On: 03/04/2013 11:42    Assessment/Plan 1 atrial fibrillation-the patient remains in atrial fibrillation today. I think she is symptomatic with increased dyspnea on exertion. She received Cardizem bolus in the emergency room and her systolic blood pressure is now in the 90s. Her heart rate is in the 110 range. I will add amiodarone for rate control. This will also be helpful to maintain sinus rhythm following cardioversion. She has not taken her apixaban since Friday morning. Her renal function has declined. Resume at 2.5 mg by mouth twice a day. Once amiodarone has been loaded we will plan to proceed with TEE guided cardioversion. I think reestablishing sinus rhythm will improve her symptoms as well as renal function. 2 recent diarrhea-possible viral gastroenteritis. Management per primary care. 3 acute kidney disease-patient's renal function has deteriorated. Possible contribution from dehydration from recent diarrhea. There may also be a component of decreased perfusion related to atrial  fibrillation.  Kirk Ruths MD 03/04/2013, 3:18 PM

## 2013-03-04 NOTE — ED Provider Notes (Signed)
CSN: [id number]     Arrival date & time 03/04/13  1100 History   First MD Initiated Contact with Patient 03/04/13 1109     Chief Complaint  Patient presents with  . Atrial Fibrillation     (Consider location/radiation/quality/duration/timing/severity/associated sxs/prior Treatment) HPI  This is an 80 yo female with a history of asthma, atrial fibrillation on Eliquis and diltiazem, and CHF who presents with nausea, vomiting, diarrhea and decreased by mouth intake. She was noted by EMS to have a heart rate in the 140s. Per the patient's daughter, she began having symptoms yesterday. She has had multiple episodes of nonbilious, nonbloody emesis. She's not been able to tolerate taking any of her medications. She's only kept down "a little bit of fluid." They also report nonbloody diarrhea. Patient hasn't had any known sick contacts. No recent hospitalizations. She denies any abdominal pain, palpitations, chest pain, or shortness of breath. Denies any fevers.  I have reviewed the patient's last echo which revealed an EF of 65-70%.  Past Medical History  Diagnosis Date  . Asthma   . PVC's (premature ventricular contractions)   . Arthritis   . CHF (congestive heart failure)   . Atrial fibrillation    Past Surgical History  Procedure Laterality Date  . Appendectomy    . Cholecystectomy    . Tonsillectomy    . Oophrectomy     Family History  Problem Relation Age of Onset  . Heart disease      No family history   History  Substance Use Topics  . Smoking status: Former Smoker -- 1.00 packs/day for 20 years    Types: Cigarettes    Quit date: 04/13/1981  . Smokeless tobacco: Never Used  . Alcohol Use: Yes     Comment: Occasional   OB History   Grav Para Term Preterm Abortions TAB SAB Ect Mult Living                 Review of Systems  Constitutional: Negative for fever.  Respiratory: Negative for cough, chest tightness and shortness of breath.   Cardiovascular: Negative for  chest pain.  Gastrointestinal: Positive for nausea, vomiting and diarrhea. Negative for abdominal pain.  Genitourinary: Negative for dysuria.  Musculoskeletal: Negative for back pain.  Skin: Negative for rash.  Neurological: Negative for headaches.  Psychiatric/Behavioral: Negative for confusion.  All other systems reviewed and are negative.      Allergies  Review of patient's allergies indicates no known allergies.  Home Medications   No current outpatient prescriptions on file. BP 118/59  Pulse 102  Temp(Src) 100.8 F (38.2 C) (Rectal)  Resp 22  Ht 5\' 3"  (1.6 m)  Wt 183 lb (83.008 kg)  BMI 32.43 kg/m2  SpO2 97% Physical Exam  Nursing note and vitals reviewed. Constitutional: She is oriented to person, place, and time. No distress.  Elderly, ill-appearing but nontoxic  HENT:  Head: Normocephalic and atraumatic.  Mucous membranes dry  Eyes: Pupils are equal, round, and reactive to light.  Neck: Neck supple.  Cardiovascular: Normal heart sounds.   Irregular irregular rhythm, tachycardia  Pulmonary/Chest: Effort normal and breath sounds normal. No respiratory distress. She has no wheezes. She has no rales.  Abdominal: Soft. There is no tenderness. There is no rebound and no guarding.  Hyperactive bowel sounds  Musculoskeletal: She exhibits no edema.  Neurological: She is alert and oriented to person, place, and time.  Skin: Skin is warm and dry.  Psychiatric: She has a normal mood and  affect.    ED Course  Procedures (including critical care time) Labs Review Labs Reviewed  CBC WITH DIFFERENTIAL - Abnormal; Notable for the following:    WBC 13.3 (*)    RBC 5.47 (*)    Hemoglobin 15.7 (*)    HCT 46.2 (*)    Neutrophils Relative % 85 (*)    Neutro Abs 11.4 (*)    Lymphocytes Relative 5 (*)    Monocytes Absolute 1.2 (*)    All other components within normal limits  COMPREHENSIVE METABOLIC PANEL - Abnormal; Notable for the following:    CO2 16 (*)    Glucose,  Bld 158 (*)    BUN 50 (*)    Creatinine, Ser 2.12 (*)    Albumin 3.4 (*)    ALT 39 (*)    GFR calc non Af Amer 20 (*)    GFR calc Af Amer 23 (*)    All other components within normal limits  URINALYSIS, ROUTINE W REFLEX MICROSCOPIC - Abnormal; Notable for the following:    Color, Urine AMBER (*)    APPearance CLOUDY (*)    Bilirubin Urine SMALL (*)    All other components within normal limits  CULTURE, BLOOD (ROUTINE X 2)  CULTURE, BLOOD (ROUTINE X 2)  TROPONIN I  LACTIC ACID, PLASMA  BASIC METABOLIC PANEL  CBC   Imaging Review Dg Chest Portable 1 View  03/04/2013   CLINICAL DATA:  Atrial fibrillation. Tachycardia with weakness and shortness of breath.  EXAM: PORTABLE CHEST - 1 VIEW  COMPARISON:  02/08/2013  FINDINGS: The cardiac silhouette is enlarged. Thoracic aortic calcification is noted. There is new pulmonary vascular congestion with mild bilateral interstitial opacity. Additional opacities are present in both lung bases. Small bilateral pleural effusions are questioned. No pneumothorax is seen. Right glenohumeral osteoarthrosis is noted. Thoracic dextroscoliosis is also noted.  IMPRESSION: Cardiomegaly and mild pulmonary edema. Superimposed atelectasis or infection in the lung bases is not excluded.   Electronically Signed   By: Logan Bores   On: 03/04/2013 11:42     CRITICAL CARE Performed by: Thayer Jew, F   Total critical care time: 30 min  Critical care time was exclusive of separately billable procedures and treating other patients.  Critical care was necessary to treat or prevent imminent or life-threatening deterioration.  Critical care was time spent personally by me on the following activities: development of treatment plan with patient and/or surrogate as well as nursing, discussions with consultants, evaluation of patient's response to treatment, examination of patient, obtaining history from patient or surrogate, ordering and performing treatments and  interventions, ordering and review of laboratory studies, ordering and review of radiographic studies, pulse oximetry and re-evaluation of patient's condition.  MDM   Final diagnoses:  Sepsis  Atrial fibrillation with RVR    Patient presents with n/v/diarrhea.  Noted to be febrile and tachycardic.  Likely viral; however, given fever and tachycardia will initiate septic w/u.  Patient given fluids and diltiazem.  EKG with afib with RVR.  Lactate normal.  Patient dry and improving with fluid.  HR 120s; diltiazem drip initiated.  Patient will be admitted.     Merryl Hacker, MD 03/04/13 [phone]

## 2013-03-04 NOTE — H&P (Signed)
[facility] Admission History and Physical Service Pager: [phone]  Patient name: Crystal Kerr Medical record number: [id number] Date of birth: 1924/10/22 Age: 80 y.o. Gender: female  Primary Care Provider: Lamar Blinks, MD Consultants: Cardiology  Code Status: Full code  Chief Complaint: Nausea/Vomiting/Diarrhea  Assessment and Plan: PSALMS OLARTE is a 80 y.o. female presenting with nausea/vomiting/diarrhea.  In the ED, patient was febrile and met SIRS criteria. Additionally, patient found to be in Afib with RVR. PMH is significant for Atrial fibrillation (on Eliquis and followed by cardiology), Diastolic heart failure (preserved EF 65%), and asthma.  Nausea/Vomiting/Diarrhea - Patient with meeting SIRS criteria on admission (tachycardia, leukocytosis, fever).  Chest x-ray revealed questionable basilar infiltrates.  UA negative. This is likely viral in origin given presentation. - Patient treated with empiric Vanc/Zosyn in the ED.  Will continue until blood culture results return.  At that time we'll likely discontinue antibiotics pending clinical improvement.  I suspect this is viral in origin given initial presentation. - Zofran PRN nausea - NS @ 100 mL hour (s/p 1 L bolus in ED).  Will have to be cautious with fluids given history of diastolic heart failure, current A. fib with RVR, and signs of pulmonary edema on chest x-ray.  Atrial fibrillation with RVR - s/p 10 mg IV Dilt x 2 in the ED - Patient currently on Cardizem drip - Cardiology consult at to aid in management.  We appreciate their help in managing this patient. - Continuing anticoagulation with Eliquis  Dehydration/AKI - Patient with a baseline creatinine of 1.1. - Patient clinically dry, dehydrated on exam. - AKI likely secondary to dehydration - IV fluids as above; Will trend BMP  Hx of Diastolic CHF - Monitor fluid status closely - Continue fluids as above for  now. - Cardiology also following.  Asthma - No evidence of acute exacerbation currently. - Continue home Symbicort and Singulair  FEN/GI: Clear Liquid diet Prophylaxis: Eliquis  Disposition: SDU; Pending clinical improvement.  History of Present Illness:  Crystal Kerr is a 80 y.o. female with history of Atrial fibrillation (on Eliquis and followed by cardiology), Diastolic heart failure (preserved EF 65%), and asthma who presents with 24-hour history of nausea, vomiting, diarrhea.   Patient reports that her symptoms began Friday morning.  At that time she developed sudden onset nausea, vomiting, and diarrhea.  Emesis described as nonbloody nonbilious.  Symptoms continued despite Imodium.  Patient was unable to take anything by mouth including medications.  No known sick contacts.  No associated abdominal pain, chest pain, shortness of breath.  No reported fevers per patient and daughter.  Of note, the daughter reports the patient has been lethargic over this period of time.  This is what prompted her to bring her to the ED.    In the ED, patient was found to be febrile (102.9).  Patient also noted to be in A. fib with RVR (initially heart rate was 140s 150s).  Chest x-ray was obtained and revealed questionable lower lobe infiltrates.  Patient was given IV diltiazem and aggressive IV fluids given the patient was clinically dehydrated.  Heart rate improved  (110's-120's) with treatment. Patient also started on empiric Vanc/Zosyn given ? Infiltrates and recent hospitalization (End of January).  Review Of Systems: Per HPI.  Otherwise 12 point review of systems was performed and was unremarkable.  Patient Active Problem List   Diagnosis Date Noted  . D-dimer, elevated 02/06/2013  . Asthma attack 02/06/2013  . Shortness of  breath 02/06/2013  . Atrial fibrillation with RVR 02/04/2013  . Acute on chronic diastolic congestive heart failure 02/04/2013  . Arthritis of spine 07/01/2012   . Intrinsic asthma 07/01/2012  . Dyspnea 04/12/2012  . PVC's (premature ventricular contractions) 04/24/2011  . Murmur, cardiac 04/24/2011   Past Medical History: Past Medical History  Diagnosis Date  . Asthma   . PVC's (premature ventricular contractions)   . Murmur, cardiac   . Arthritis   . CHF (congestive heart failure)   . Atrial fibrillation    Past Surgical History: Past Surgical History  Procedure Laterality Date  . Appendectomy    . Cholecystectomy    . Tonsillectomy    . Oophrectomy     Social History: History  Substance Use Topics  . Smoking status: Former Smoker -- 1.00 packs/day for 20 years    Types: Cigarettes    Quit date: 04/13/1981  . Smokeless tobacco: Never Used  . Alcohol Use: No   Family History: Noncontributory.   Allergies and Medications: No Known Allergies No current facility-administered medications on file prior to encounter.   Current Outpatient Prescriptions on File Prior to Encounter  Medication Sig Dispense Refill  . albuterol (PROAIR HFA) 108 (90 BASE) MCG/ACT inhaler Inhale 2 puffs into the lungs every 4 (four) hours as needed for wheezing or shortness of breath. 2 puffs every 4 hours as needed only  if your can't catch your breath  1 Inhaler  11  . apixaban (ELIQUIS) 5 MG TABS tablet Take 1 tablet (5 mg total) by mouth 2 (two) times daily.  60 tablet  0  . diltiazem (CARDIZEM CD) 240 MG 24 hr capsule Take 1 capsule (240 mg total) by mouth daily.  30 capsule  0  . montelukast (SINGULAIR) 10 MG tablet Take 10 mg by mouth at bedtime.      . SYMBICORT 160-4.5 MCG/ACT inhaler Take 2 puffs first thing in am and then another 2 puffs about 12 hours later.        Objective: BP 116/58  Pulse 103  Temp(Src) 102.9 F (39.4 C) (Rectal)  Resp 22  SpO2 95% Exam: General: elderly female, resting in bed; NAD. HEENT: Dry mucous membranes.   Cardiovascular: Tachy, Irregular. No appreciable murmur. Respiratory: Clear to auscultation. No  wheezing or rales noted appreciated on exam.  Abdomen: soft, nontender, nondistended.  Extremities: warm, well perfused. No appreciable edema.  Skin: Warm, dry, intact. Neuro: AO x 3.  No focal deficits on exam.   Labs and Imaging: CBC BMET   Recent Labs Lab 03/04/13 1220  WBC 13.3*  HGB 15.7*  HCT 46.2*  PLT 281    Recent Labs Lab 03/04/13 1132  NA 138  K 4.1  CL 100  CO2 16*  BUN 50*  CREATININE 2.12*  GLUCOSE 158*  CALCIUM 9.1     Dg Chest Portable 1 View 03/04/2013  IMPRESSION: Cardiomegaly and mild pulmonary edema. Superimposed atelectasis or infection in the lung bases is not excluded.     Coral Spikes, DO 03/04/2013, 1:56 PM PGY-2, [place] Intern pager: [phone], text pages welcome

## 2013-03-05 DIAGNOSIS — A419 Sepsis, unspecified organism: Secondary | ICD-10-CM

## 2013-03-05 DIAGNOSIS — I4891 Unspecified atrial fibrillation: Principal | ICD-10-CM

## 2013-03-05 DIAGNOSIS — R197 Diarrhea, unspecified: Secondary | ICD-10-CM

## 2013-03-05 LAB — CBC
HCT: 38 % (ref 36.0–46.0)
Hemoglobin: 12.8 g/dL (ref 12.0–15.0)
MCH: 28.4 pg (ref 26.0–34.0)
MCHC: 33.7 g/dL (ref 30.0–36.0)
MCV: 84.4 fL (ref 78.0–100.0)
PLATELETS: 218 10*3/uL (ref 150–400)
RBC: 4.5 MIL/uL (ref 3.87–5.11)
RDW: 15.4 % (ref 11.5–15.5)
WBC: 8.4 10*3/uL (ref 4.0–10.5)

## 2013-03-05 LAB — INFLUENZA PANEL BY PCR (TYPE A & B)
H1N1 flu by pcr: NOT DETECTED
Influenza A By PCR: NEGATIVE
Influenza B By PCR: NEGATIVE

## 2013-03-05 LAB — BASIC METABOLIC PANEL
BUN: 48 mg/dL — ABNORMAL HIGH (ref 6–23)
CALCIUM: 8.2 mg/dL — AB (ref 8.4–10.5)
CO2: 21 meq/L (ref 19–32)
Chloride: 98 mEq/L (ref 96–112)
Creatinine, Ser: 1.72 mg/dL — ABNORMAL HIGH (ref 0.50–1.10)
GFR calc Af Amer: 30 mL/min — ABNORMAL LOW (ref >90)
GFR, EST NON AFRICAN AMERICAN: 26 mL/min — AB (ref >90)
GLUCOSE: 104 mg/dL — AB (ref 70–99)
Potassium: 3.3 mEq/L — ABNORMAL LOW (ref 3.7–5.3)
Sodium: 132 mEq/L — ABNORMAL LOW (ref 137–147)

## 2013-03-05 LAB — CLOSTRIDIUM DIFFICILE BY PCR: Toxigenic C. Difficile by PCR: NEGATIVE

## 2013-03-05 MED ORDER — POTASSIUM CHLORIDE CRYS ER 20 MEQ PO TBCR
40.0000 meq | EXTENDED_RELEASE_TABLET | Freq: Once | ORAL | Status: AC
  Administered 2013-03-05: 40 meq via ORAL
  Filled 2013-03-05: qty 2

## 2013-03-05 MED ORDER — POTASSIUM CHLORIDE CRYS ER 20 MEQ PO TBCR: 40.0000 meq | EXTENDED_RELEASE_TABLET | Freq: Two times a day (BID) | ORAL | Status: DC

## 2013-03-05 MED ORDER — POTASSIUM CHLORIDE CRYS ER 20 MEQ PO TBCR
40.0000 meq | EXTENDED_RELEASE_TABLET | Freq: Two times a day (BID) | ORAL | Status: AC
  Administered 2013-03-05 (×2): 40 meq via ORAL
  Filled 2013-03-05: qty 4
  Filled 2013-03-05 (×2): qty 2

## 2013-03-05 MED ORDER — LOPERAMIDE HCL 2 MG PO CAPS
2.0000 mg | ORAL_CAPSULE | Freq: Three times a day (TID) | ORAL | Status: DC | PRN
  Administered 2013-03-05 (×2): 2 mg via ORAL
  Filled 2013-03-05 (×2): qty 1

## 2013-03-05 MED ORDER — LOPERAMIDE HCL 1 MG/5ML PO LIQD
2.0000 mg | Freq: Three times a day (TID) | ORAL | Status: DC | PRN
  Filled 2013-03-05: qty 10

## 2013-03-05 MED ORDER — LOPERAMIDE HCL 2 MG PO CAPS
4.0000 mg | ORAL_CAPSULE | Freq: Once | ORAL | Status: AC
  Administered 2013-03-05: 4 mg via ORAL
  Filled 2013-03-05: qty 2

## 2013-03-05 NOTE — Progress Notes (Signed)
Family Medicine Teaching Service Daily Progress Note Intern Pager: [phone]  Patient name: Crystal Kerr Medical record number: [id number] Date of birth: 23-May-1924 Age: 80 y.o. Gender: female  Primary Care Provider: Lamar Blinks, MD Consultants: Cardiology Code Status: Full code  Pt Overview and Major Events to Date:  2/21 - Admitted.  Diarrhea overnight 2/22 - CDiff pending; Afib - Rate controlled on Amiodarone  Assessment and Plan: Crystal Kerr is a 80 y.o. female presenting with nausea/vomiting/diarrhea. In the ED, patient was febrile and met SIRS criteria. Additionally, patient found to be in Afib with RVR. PMH is significant for Atrial fibrillation (on Eliquis and followed by cardiology), Diastolic heart failure (preserved EF 65%), and asthma.   Nausea/Vomiting/Diarrhea - Patient with meeting SIRS criteria on admission (tachycardia, leukocytosis, fever). Chest x-ray revealed questionable basilar infiltrates. UA negative. This is likely viral in origin given presentation.  - Continuing empiric Vanc/Zosyn currently.  Patient on [place] O2 currently.  RN to reassess O2 requirement this am.  IF no O2 requirement, will plan to D/C Abx. - Zofran PRN nausea  - NS @ 50 mL hour - Will monitor fluid status closely.  - Influenza PCR today.   Atrial fibrillation with RVR  - Cardiology following; appreciate recs.  Patient to continue Amiodarone.  Cards planning for Cardioversion once diarrhea improves. - Continuing anticoagulation with Eliquis (dose reduced to 2.5 BID)  Dehydration/AKI  - Patient with a baseline creatinine of 1.1.  - Creatinine improving this am following IV fluids (1.72 this am) - Fluids as above.  Patient to increase PO intake today.   Hypokalemia  - Secondary to diarrhea - Repleting orally today  Hx of Diastolic CHF  - Monitor fluid status closely.  No exacerbation currently  Asthma  - Continue home Symbicort and Singulair   FEN/GI: Clear Liquid  diet  Prophylaxis: Eliquis   Disposition: SDU; Pending clinical improvement.  Subjective: Reports severe diarrhea overnight. No other complaints this am. No CP or SOB.  Objective: Temp:  [97.5 F (36.4 C)-102.9 F (39.4 C)] 97.5 F (36.4 C) (02/22 0739) Pulse Rate:  [59-154] 92 (02/22 0746) Resp:  [13-36] 24 (02/22 0746) BP: (90-131)/(30-85) 105/57 mmHg (02/22 0739) SpO2:  [88 %-99 %] 98 % (02/22 0746) Weight:  [183 lb (83.008 kg)] 183 lb (83.008 kg) (02/21 1521)  Physical Exam: General: resting in bed, NAD.  Cardiovascular: Irregular. Tachy. No murmur. Respiratory: Bibasilar rales noted.  Abdomen: soft, nontender, nondistended.  Positive bowel sounds. No palpable organomegaly. No rebound or guarding. Extremities: No LE edema.   Laboratory:  Recent Labs Lab 03/04/13 1220 03/05/13 0320  WBC 13.3* 8.4  HGB 15.7* 12.8  HCT 46.2* 38.0  PLT 281 218    Recent Labs Lab 03/04/13 1132 03/05/13 0320  NA 138 132*  K 4.1 3.3*  CL 100 98  CO2 16* 21  BUN 50* 48*  CREATININE 2.12* 1.72*  CALCIUM 9.1 8.2*  PROT 7.5  --   BILITOT 0.4  --   ALKPHOS 108  --   ALT 39*  --   AST 37  --   GLUCOSE 158* 104*   Imaging/Diagnostic Tests:  Dg Chest Portable 1 View  03/04/2013 IMPRESSION: Cardiomegaly and mild pulmonary edema. Superimposed atelectasis or infection in the lung bases is not excluded.   Coral Spikes, DO 03/05/2013, 9:21 AM PGY-2, [place] Intern pager: [phone], text pages welcome

## 2013-03-05 NOTE — Progress Notes (Signed)
Addendum. Patient not on Vanc. She is on Zosyn. Plan to f/u C diff PCR. Less likely to be C diff given normalized WBC w/o treatment, but will treat accordingly if positive.   Adrina Armijo C 8:27 AM

## 2013-03-05 NOTE — Progress Notes (Signed)
    Subjective:  Denies CP or dyspnea; complains of diarrhea   Objective:  Filed Vitals:   03/05/13 0400 03/05/13 0739 03/05/13 0744 03/05/13 0746  BP: 110/60 105/57    Pulse: 85 93  92  Temp: 98.3 F (36.8 C) 97.5 F (36.4 C)    TempSrc: Oral Oral  Oral  Resp: 21 26  24   Height:      Weight:      SpO2: 99% 96% 97% 98%    Intake/Output from previous day:  Intake/Output Summary (Last 24 hours) at 03/05/13 0805 Last data filed at 03/05/13 0600  Gross per 24 hour  Intake 1895.2 ml  Output      0 ml  Net 1895.2 ml    Physical Exam: Physical exam: Well-developed well-nourished in no acute distress.  Skin is warm and dry.  HEENT is normal.  Neck is supple.   Chest is clear to auscultation with normal expansion.  Cardiovascular exam is irregular Abdominal exam nontender or distended. No masses palpated. Extremities show no edema. neuro grossly intact    Lab Results: Basic Metabolic Panel:  Recent Labs  03/04/13 1132 03/05/13 0320  NA 138 132*  K 4.1 3.3*  CL 100 98  CO2 16* 21  GLUCOSE 158* 104*  BUN 50* 48*  CREATININE 2.12* 1.72*  CALCIUM 9.1 8.2*   CBC:  Recent Labs  03/04/13 1220 03/05/13 0320  WBC 13.3* 8.4  NEUTROABS 11.4*  --   HGB 15.7* 12.8  HCT 46.2* 38.0  MCV 84.5 84.4  PLT 281 218   Cardiac Enzymes:  Recent Labs  03/04/13 1132  TROPONINI <0.30     Assessment/Plan:  1 atrial fibrillation-the patient remains in atrial fibrillation today. I think she is symptomatic with increased dyspnea on exertion. Continue IV amiodarone; rate is improved. This will also be helpful to maintain sinus rhythm following cardioversion. She had not taken her apixaban since Friday morning. Her renal function has declined. Apixaban resumed at 2.5 mg by mouth twice a day. Once amiodarone has been loaded and diarrhea improved, we will plan to proceed with TEE guided cardioversion. I think reestablishing sinus rhythm will improve her symptoms as well as  renal function.  2 diarrhea-possible viral gastroenteritis. Management per primary care.  3 acute kidney disease-patient's renal function has deteriorated. Possible contribution from dehydration from recent diarrhea. There may also be a component of decreased perfusion related to atrial fibrillation. Follow; adjust apixaban based on FU Cr.   Kirk Ruths 03/05/2013, 8:05 AM

## 2013-03-05 NOTE — Progress Notes (Signed)
Attending Addendum  I examined the patient and discussed the assessment and plan with Dr. Lacinda Axon. I have reviewed the note and agree.    Please see addendum to H&P for today's A&P.   Crystal Kerr, [place]

## 2013-03-05 NOTE — H&P (Signed)
Attending Addendum  I examined the patient and discussed the assessment and plan with Dr. Lacinda Axon. I have reviewed the note and agree.  Briefly, 80 yo F with history of A fib on eliquis and diltiazem admitted for RVR in the setting of diarrhea x 12 hrs. She was recently admitted one month ago and treated for a UTI with keflex.  She has return of diarrhea this AM. Non bloody. No abdominal pain.  BP 105/57  Pulse 92  Temp(Src) 97.5 F (36.4 C) (Oral)  Resp 24  Ht 5\' 3"  (1.6 m)  Wt 183 lb (83.008 kg)  BMI 32.43 kg/m2  SpO2 98% General appearance: alert, cooperative and no distress Lungs: clear to auscultation bilaterally Heart: regular rate variable rhythm. No MRG.   Abdomen: soft, non-tender; bowel sounds normal; no masses,  no organomegaly  Cr improved to 1.7  WBC normalized   A/P:  80 yo F with Afib with RVR in the setting in acute diarrheal illness.   1. Afib with RVR: improved on amiodarone gtt. Plan for TEE with cardioversion today.   2. Diarrhea: viral vs C diff. Patient on Vanc. F/u C diff PCR. imodium q 8 prn to limit need for stooling and activity.   3. Hypokalemia: in the setting of diarrhea. With replete orally.     Boykin Nearing, [place]

## 2013-03-06 ENCOUNTER — Telehealth: Payer: Self-pay | Admitting: Internal Medicine

## 2013-03-06 ENCOUNTER — Inpatient Hospital Stay (HOSPITAL_COMMUNITY): Payer: Medicare Other

## 2013-03-06 DIAGNOSIS — I5033 Acute on chronic diastolic (congestive) heart failure: Secondary | ICD-10-CM | POA: Diagnosis not present

## 2013-03-06 DIAGNOSIS — J811 Chronic pulmonary edema: Secondary | ICD-10-CM | POA: Diagnosis not present

## 2013-03-06 DIAGNOSIS — R0989 Other specified symptoms and signs involving the circulatory and respiratory systems: Secondary | ICD-10-CM

## 2013-03-06 DIAGNOSIS — A419 Sepsis, unspecified organism: Secondary | ICD-10-CM | POA: Diagnosis not present

## 2013-03-06 DIAGNOSIS — R0609 Other forms of dyspnea: Secondary | ICD-10-CM | POA: Diagnosis not present

## 2013-03-06 DIAGNOSIS — I4891 Unspecified atrial fibrillation: Secondary | ICD-10-CM | POA: Diagnosis not present

## 2013-03-06 DIAGNOSIS — I509 Heart failure, unspecified: Secondary | ICD-10-CM

## 2013-03-06 DIAGNOSIS — R197 Diarrhea, unspecified: Secondary | ICD-10-CM | POA: Diagnosis not present

## 2013-03-06 LAB — CBC
HEMATOCRIT: 36.2 % (ref 36.0–46.0)
Hemoglobin: 11.9 g/dL — ABNORMAL LOW (ref 12.0–15.0)
MCH: 27.8 pg (ref 26.0–34.0)
MCHC: 32.9 g/dL (ref 30.0–36.0)
MCV: 84.6 fL (ref 78.0–100.0)
PLATELETS: 218 10*3/uL (ref 150–400)
RBC: 4.28 MIL/uL (ref 3.87–5.11)
RDW: 15.6 % — ABNORMAL HIGH (ref 11.5–15.5)
WBC: 7.3 10*3/uL (ref 4.0–10.5)

## 2013-03-06 LAB — BASIC METABOLIC PANEL
BUN: 20 mg/dL (ref 6–23)
CALCIUM: 8.3 mg/dL — AB (ref 8.4–10.5)
CHLORIDE: 108 meq/L (ref 96–112)
CO2: 20 mEq/L (ref 19–32)
CREATININE: 1.17 mg/dL — AB (ref 0.50–1.10)
GFR calc Af Amer: 47 mL/min — ABNORMAL LOW (ref >90)
GFR calc non Af Amer: 41 mL/min — ABNORMAL LOW (ref >90)
GLUCOSE: 86 mg/dL (ref 70–99)
Potassium: 4.2 mEq/L (ref 3.7–5.3)
Sodium: 140 mEq/L (ref 137–147)

## 2013-03-06 MED ORDER — LEVOFLOXACIN 500 MG PO TABS: 500.0000 mg | ORAL_TABLET | ORAL | Status: DC

## 2013-03-06 MED ORDER — LEVOFLOXACIN 750 MG PO TABS
750.0000 mg | ORAL_TABLET | Freq: Once | ORAL | Status: DC
  Filled 2013-03-06: qty 1

## 2013-03-06 MED ORDER — LEVOFLOXACIN 750 MG PO TABS: 750.0000 mg | ORAL_TABLET | ORAL | Status: DC

## 2013-03-06 NOTE — Progress Notes (Addendum)
   Subjective:  Denies CP or dyspnea; diarrhea has resolved, the patient feels better.   Marland Kitchen apixaban  2.5 mg Oral Q12H  . budesonide-formoterol  2 puff Inhalation BID  . montelukast  10 mg Oral QHS  . pantoprazole  40 mg Oral Daily  . sodium chloride  3 mL Intravenous Q12H   . sodium chloride 50 mL/hr at 03/05/13 0600  . amiodarone (NEXTERONE PREMIX) 360 mg/200 mL dextrose 30 mg/hr (03/06/13 0242)   Objective:  Filed Vitals:   03/06/13 0758 03/06/13 0800 03/06/13 0952 03/06/13 1155  BP: 105/72 105/72  134/65  Pulse: 89 84  99  Temp: 98.8 F (37.1 C)   98.8 F (37.1 C)  TempSrc: Oral   Oral  Resp: 27 22  25   Height:      Weight:      SpO2: 94% 95% 94% 97%   Intake/Output from previous day:  Intake/Output Summary (Last 24 hours) at 03/06/13 1316 Last data filed at 03/06/13 1200  Gross per 24 hour  Intake 1954.1 ml  Output      0 ml  Net 1954.1 ml    Physical Exam: Physical exam: Well-developed well-nourished in no acute distress.  Skin is warm and dry.  HEENT is normal.  Neck is supple.   Chest is clear to auscultation with normal expansion.  Cardiovascular exam is irregular Abdominal exam nontender or distended. No masses palpated. Extremities show no edema. neuro grossly intact  Lab Results: Basic Metabolic Panel:  Recent Labs  03/05/13 0320 03/06/13 1002  NA 132* 140  K 3.3* 4.2  CL 98 108  CO2 21 20  GLUCOSE 104* 86  BUN 48* 20  CREATININE 1.72* 1.17*  CALCIUM 8.2* 8.3*   CBC:  Recent Labs  03/04/13 1220 03/05/13 0320 03/06/13 1002  WBC 13.3* 8.4 7.3  NEUTROABS 11.4*  --   --   HGB 15.7* 12.8 11.9*  HCT 46.2* 38.0 36.2  MCV 84.5 84.4 84.6  PLT 281 218 218   Cardiac Enzymes:  Recent Labs  03/04/13 1132  TROPONINI <0.30     Assessment/Plan:   1. Atrial fibrillation - the patient remains in atrial fibrillation today, rate controlled. She is symptomatic with improved dyspnea on exertion. Continue IV amiodarone; rate is improved.  This will also be helpful to maintain sinus rhythm following cardioversion. We will increase he apixaban dose to 5 mg by mouth twice a day as Crea has improved. Her diarrhea has resolved.  We will plan for TEE/CV for tomorrow.  2. Diarrhea - possible viral gastroenteritis. Management per primary care.   3. Acute kidney disease - now significantly improved, possible contribution from dehydration from recent diarrhea. There may also be a component of decreased perfusion related to atrial fibrillation.    Ena Dawley, H 03/06/2013, 1:16 PM

## 2013-03-06 NOTE — Telephone Encounter (Signed)
I called and spoke with daughter. Pt was admitted via ER Saturday morning. FYI for MW.

## 2013-03-06 NOTE — Care Management Note (Addendum)
    Page 1 of 2   03/10/2013     3:29:02 PM   CARE MANAGEMENT NOTE 03/10/2013  Patient:  TULEEN, MANDELBAUM   Account Number:  [id number]  Date Initiated:  03/06/2013  Documentation initiated by:  Elissa Hefty  Subjective/Objective Assessment:   adm w at fib w rvr     Action/Plan:   lives w fam, pcp dr Janett Billow [place]   Anticipated DC Date:  03/10/2013   Anticipated DC Plan:  [place]  CM consult      [place]   Choice offered to / List presented to:  C-1 Patient        [place] arranged  HH-1 RN  [place].   Status of service:  Completed, signed off Medicare Important Message given?   (If response is "NO", the following Medicare IM given date fields will be blank) Date Medicare IM given:   Date Additional Medicare IM given:    Discharge Disposition:  [place]  Per UR Regulation:  Reviewed for med. necessity/level of care/duration of stay  If discussed at [place] of Stay Meetings, dates discussed:   03/09/2013    Comments:  03/10/13 Janice Bodine,RN,BSN [phone] PT [place]; NOTIFIED [place].  2/26 1045a debbie dowell rn,bsn spoke w pt. she has walker at home. feels weak. would like hhpt and poss hhrn just to ck on her. went over list of hhc agencies and no pref. ref to [facility] w [facility] for rn and pt. will await final  orders. pt would like [facility] to call her cell for appts at [phone].

## 2013-03-06 NOTE — Progress Notes (Signed)
FMTS ATTENDING  NOTE Crystal Bradway,MD I  have seen and examined this patient, reviewed their chart. I have discussed this patient with the resident. I agree with the resident's findings, assessment and care plan. 

## 2013-03-06 NOTE — Progress Notes (Signed)
Family Medicine Teaching Service Daily Progress Note Intern Pager: [phone]  Patient name: Crystal Kerr Medical record number: [id number] Date of birth: 01-Nov-1924 Age: 80 y.o. Gender: female  Primary Care Provider: Lamar Blinks, MD Consultants: Cardiology Code Status: Full code  Pt Overview and Major Events to Date:  2/21 - Admitted.  Diarrhea overnight 2/22 - CDiff pending; Afib - Rate controlled on Amiodarone 2/23- C diff negative, flu negative. Remains in A-fib on amiodarone. Pt may not want cardioversion.   Assessment and Plan: EMMALI KAROW is a 80 y.o. female presenting with nausea/vomiting/diarrhea. In the ED, patient was febrile and met SIRS criteria. Additionally, patient found to be in Afib with RVR. PMH is significant for Atrial fibrillation (on Eliquis and followed by cardiology), Diastolic heart failure (preserved EF 65%), and asthma.   Nausea/Vomiting/Diarrhea - Patient with meeting SIRS criteria on admission (tachycardia, leukocytosis, fever). Chest x-ray revealed questionable basilar infiltrates. UA negative. This is likely viral in origin given presentation.  - Vanc/Zosyn started empirically (2/21-2/23). Discontinued due to negative blood cultures, no cough, and no oxygen requirement. Repeat CXR shows no infiltrate.  - Zofran PRN nausea (pt has not needed) - loperamide PRN diarrhea - NS @ 50 mL hour - Will monitor fluid status closely.   Atrial fibrillation with RVR  - Cardiology following; appreciate recs.  Patient to continue Amiodarone.  Cards planning for Cardioversion once diarrhea improves however pt states she may not want this.  - Improved pulmonary edema on repeat CXR 2/23.  - Continuing anticoagulation with Eliquis (dose reduced to 2.5 BID)  Dehydration/AKI  - Patient with a baseline creatinine of 1.1, elevated to 2.1 on admission.  - Creatinine back to baseline this am following IV fluids - Fluids as above. Pt to attempt PO today and  fluids can likely be discontinued if pt able to tolerate PO.   Hypokalemia  - Secondary to diarrhea - Repleting orally as needed  Hx of Diastolic CHF  - Monitor fluid status closely.  No exacerbation currently  Asthma  - Continue home Symbicort and Singulair   FEN/GI: NPO pending decision regarding cardioversion.  Prophylaxis: Eliquis   Disposition: SDU; Pending clinical improvement.  Subjective: Reports continued non-bloody diarrhea overnight but improved compared to previous nights. Denies chest pain, SOB, or nausea. Reports he overall feels somewhat better. States she likely does not want cardioversion as she is concerned it will not provide much benefit in someone her age. Encouraged the patient to discuss her concerns with cardiology but that she could refuse the cardioversion if desired.   Objective: Temp:  [97.4 F (36.3 C)-98.8 F (37.1 C)] 98.8 F (37.1 C) (02/23 1155) Pulse Rate:  [80-102] 99 (02/23 1155) Resp:  [21-29] 25 (02/23 1155) BP: (98-134)/(47-72) 134/65 mmHg (02/23 1155) SpO2:  [93 %-97 %] 97 % (02/23 1155)  Physical Exam: General: resting in bed, pleasant and conversant, in NAD.  Cardiovascular: Irregular. Non-tachycardic. No murmur. Respiratory: Bibasilar rales noted.  No wheezes or crackles Abdomen: soft, nontender, nondistended.  Positive bowel sounds. No rebound or guarding. Extremities: No LE edema.  Neuro: No focal deficits  Laboratory:  Recent Labs Lab 03/04/13 1220 03/05/13 0320 03/06/13 1002  WBC 13.3* 8.4 7.3  HGB 15.7* 12.8 11.9*  HCT 46.2* 38.0 36.2  PLT 281 218 218    Recent Labs Lab 03/04/13 1132 03/05/13 0320 03/06/13 1002  NA 138 132* 140  K 4.1 3.3* 4.2  CL 100 98 108  CO2 16* 21 20  BUN 50* 48* 20  CREATININE 2.12* 1.72* 1.17*  CALCIUM 9.1 8.2* 8.3*  PROT 7.5  --   --   BILITOT 0.4  --   --   ALKPHOS 108  --   --   ALT 39*  --   --   AST 37  --   --   GLUCOSE 158* 104* 86   Imaging/Diagnostic Tests:  Dg  Chest Portable 1 View  03/04/2013 IMPRESSION: Cardiomegaly and mild pulmonary edema. Superimposed atelectasis or infection in the lung bases is not excluded.   Crystal Kerr, Med Student 03/06/2013, 12:02 PM MS4, [place] Intern pager: [phone], text pages welcome  I have seen and evaluated the above patient.  I agree with the above note. Additionally, I spoke cardiology planning on cardioversion later this week (Wed) once diarrhea improves and patient has received approximately total 10 g of amiodarone.  Physical exam: General: resting in bed, pleasant and conversant, in NAD.  Cardiovascular: Irregular. Non-tachycardic. No murmur. Respiratory: Bibasilar rales noted.  No wheezes or crackles Abdomen: soft, nontender, nondistended.  Positive bowel sounds. No rebound or guarding. Extremities: No LE edema.  Neuro: No focal deficits  [employer] PGY-2

## 2013-03-07 ENCOUNTER — Ambulatory Visit: Payer: Medicare Other | Admitting: Adult Health

## 2013-03-07 ENCOUNTER — Ambulatory Visit: Payer: BLUE CROSS/BLUE SHIELD | Admitting: Cardiovascular Disease

## 2013-03-07 DIAGNOSIS — I5033 Acute on chronic diastolic (congestive) heart failure: Secondary | ICD-10-CM | POA: Diagnosis not present

## 2013-03-07 DIAGNOSIS — I4891 Unspecified atrial fibrillation: Secondary | ICD-10-CM | POA: Diagnosis not present

## 2013-03-07 DIAGNOSIS — A419 Sepsis, unspecified organism: Secondary | ICD-10-CM | POA: Diagnosis not present

## 2013-03-07 DIAGNOSIS — R197 Diarrhea, unspecified: Secondary | ICD-10-CM | POA: Diagnosis not present

## 2013-03-07 DIAGNOSIS — R0609 Other forms of dyspnea: Secondary | ICD-10-CM | POA: Diagnosis not present

## 2013-03-07 LAB — BASIC METABOLIC PANEL
BUN: 11 mg/dL (ref 6–23)
CALCIUM: 8.4 mg/dL (ref 8.4–10.5)
CO2: 25 mEq/L (ref 19–32)
CREATININE: 0.93 mg/dL (ref 0.50–1.10)
Chloride: 105 mEq/L (ref 96–112)
GFR calc Af Amer: 63 mL/min — ABNORMAL LOW (ref >90)
GFR calc non Af Amer: 54 mL/min — ABNORMAL LOW (ref >90)
GLUCOSE: 89 mg/dL (ref 70–99)
Potassium: 3.8 mEq/L (ref 3.7–5.3)
Sodium: 141 mEq/L (ref 137–147)

## 2013-03-07 MED ORDER — APIXABAN 5 MG PO TABS
5.0000 mg | ORAL_TABLET | Freq: Two times a day (BID) | ORAL | Status: DC
  Administered 2013-03-07 – 2013-03-10 (×6): 5 mg via ORAL
  Filled 2013-03-07 (×9): qty 1

## 2013-03-07 MED ORDER — SODIUM CHLORIDE 0.9 % IV SOLN
INTRAVENOUS | Status: DC
  Administered 2013-03-07: 21:00:00 via INTRAVENOUS

## 2013-03-07 MED ORDER — SODIUM CHLORIDE 0.9 % IV SOLN: INTRAVENOUS | Status: DC

## 2013-03-07 NOTE — Progress Notes (Signed)
FMTS ATTENDING  NOTE Ibrohim Simmers,MD I  have seen and examined this patient, reviewed their chart. I have discussed this patient with the resident. I agree with the resident's findings, assessment and care plan. 

## 2013-03-07 NOTE — Progress Notes (Signed)
Family Medicine Teaching Service Daily Progress Note Intern Pager: [phone]  Patient name: Crystal Kerr Medical record number: [id number] Date of birth: [birth year] Age: 80 y.o. Gender: female  Primary Care Provider: Lamar Blinks, MD Consultants: Cardiology Code Status: Full code  Pt Overview and Major Events to Date:  2/21 - Admitted.  Diarrhea overnight 2/22 - CDiff pending; Afib - Rate controlled on Amiodarone 2/23- C diff negative, flu negative. Remains in A-fib on amiodarone. Pt may not want cardioversion.  2/24- Diarrhea resolved, opting for cardioversion  Assessment and Plan: Crystal Kerr is a 80 y.o. female presenting with nausea/vomiting/diarrhea. In the ED, patient was febrile and met SIRS criteria. Additionally, patient found to be in Afib with RVR. PMH is significant for Atrial fibrillation (on Eliquis and followed by cardiology), Diastolic heart failure (preserved EF 65%), and asthma. GI symptoms resolving with plan for cardioversion to resolve a fib.   Nausea/Vomiting/Diarrhea - Resolved. Patient with meeting SIRS criteria on admission (tachycardia, leukocytosis, fever). Chest x-ray revealed questionable basilar infiltrates. UA negative. This is likely viral in origin given presentation.  - Vanc/Zosyn started empirically (2/21-2/23).Repeat CXR shows no infiltrate.  - Zofran PRN nausea (pt has not needed) - loperamide PRN diarrhea   Atrial fibrillation with RVR  - Cardiology following; appreciate recs.  Patient to continue Amiodarone drip.  Cards planning for Cardioversion on 2/25.  - Improved pulmonary edema on repeat CXR 2/23.  - Continuing anticoagulation with Eliquis (dose reduced to 2.5 BID)  Dehydration/AKI  - Patient with a baseline creatinine of 1.1, elevated to 2.1 on admission.  - Creatinine back to baseline following IV fluids - Pt tolerating PO, d/c IVF  Hypokalemia  - Secondary to diarrhea - Repleting orally as needed  Hx of  Diastolic CHF  - Monitor fluid status closely.  No exacerbation currently  Asthma  - Continue home Symbicort and Singulair   FEN/GI: Regular diet, KVO. Prophylaxis: Eliquis   Disposition: SDU; Pending cardioversion. Request PT eval today.   Subjective: Reports diarrhea has resolved and is feeling better today. Thinks she still somewhat short of breath when ambulating to the restroom but is improving. Pt lives with her daughter who works during the day and pt is amenable to PT assessment.    Objective: Temp:  [98 F (36.7 C)-99.1 F (37.3 C)] 98.6 F (37 C) (02/24 0736) Pulse Rate:  [73-99] 73 (02/24 0736) Resp:  [18-26] 25 (02/24 0736) BP: (122-155)/(58-79) 155/75 mmHg (02/24 0736) SpO2:  [94 %-97 %] 97 % (02/24 0736)  Physical Exam: General: resting in bed, pleasant and conversant, in NAD.  Cardiovascular: Irregular. Non-tachycardic. No murmur. Respiratory: Bibasilar rales noted.  No wheezes or crackles Abdomen: soft, nontender, nondistended.  Positive bowel sounds. No rebound or guarding. Extremities: No LE edema.  Neuro: No focal deficits  Laboratory:  Recent Labs Lab 03/04/13 1220 03/05/13 0320 03/06/13 1002  WBC 13.3* 8.4 7.3  HGB 15.7* 12.8 11.9*  HCT 46.2* 38.0 36.2  PLT 281 218 218    Recent Labs Lab 03/04/13 1132 03/05/13 0320 03/06/13 1002  NA 138 132* 140  K 4.1 3.3* 4.2  CL 100 98 108  CO2 16* 21 20  BUN 50* 48* 20  CREATININE 2.12* 1.72* 1.17*  CALCIUM 9.1 8.2* 8.3*  PROT 7.5  --   --   BILITOT 0.4  --   --   ALKPHOS 108  --   --   ALT 39*  --   --   AST 37  --   --  GLUCOSE 158* 104* 86   Imaging/Diagnostic Tests:  Dg Chest Portable 1 View  03/04/2013 IMPRESSION: Cardiomegaly and mild pulmonary edema. Superimposed atelectasis or infection in the lung bases is not excluded.   Britta Mccreedy, Med Student 03/07/2013, 9:14 AM MS4, [place] Intern pager: [phone], text pages welcome  I have seen and evaluated  the above patient.  I agree with the above note. Awaiting for Cardioversion. Appreciate cardiology consult.  Physical exam: General: resting in bed, pleasant and conversant, in NAD.  Cardiovascular: Irregular. Non-tachycardic. No murmur. Respiratory: Bibasilar rales noted.  No wheezes or crackles Abdomen: soft, nontender, nondistended.  Positive bowel sounds. No rebound or guarding. Extremities: No LE edema.  Neuro: No focal deficits  D. [place] Philippa Sicks, MD Family Medicine  PGY-3

## 2013-03-08 ENCOUNTER — Encounter (HOSPITAL_COMMUNITY)
Admission: EM | Disposition: A | Discharge status: Home / Self Care | Payer: Self-pay | Source: Home / Self Care | Attending: Family Medicine

## 2013-03-08 ENCOUNTER — Encounter (HOSPITAL_COMMUNITY): Payer: Self-pay | Admitting: *Deleted

## 2013-03-08 ENCOUNTER — Encounter (HOSPITAL_COMMUNITY): Payer: Medicare Other | Admitting: Anesthesiology

## 2013-03-08 ENCOUNTER — Inpatient Hospital Stay (HOSPITAL_COMMUNITY): Payer: Medicare Other | Admitting: Anesthesiology

## 2013-03-08 DIAGNOSIS — A419 Sepsis, unspecified organism: Secondary | ICD-10-CM | POA: Diagnosis not present

## 2013-03-08 DIAGNOSIS — I5033 Acute on chronic diastolic (congestive) heart failure: Secondary | ICD-10-CM | POA: Diagnosis not present

## 2013-03-08 DIAGNOSIS — N179 Acute kidney failure, unspecified: Secondary | ICD-10-CM | POA: Diagnosis not present

## 2013-03-08 DIAGNOSIS — R197 Diarrhea, unspecified: Secondary | ICD-10-CM | POA: Diagnosis not present

## 2013-03-08 DIAGNOSIS — R0609 Other forms of dyspnea: Secondary | ICD-10-CM | POA: Diagnosis not present

## 2013-03-08 DIAGNOSIS — I4891 Unspecified atrial fibrillation: Secondary | ICD-10-CM | POA: Diagnosis not present

## 2013-03-08 DIAGNOSIS — R6510 Systemic inflammatory response syndrome (SIRS) of non-infectious origin without acute organ dysfunction: Secondary | ICD-10-CM | POA: Diagnosis not present

## 2013-03-08 HISTORY — PX: TEE WITHOUT CARDIOVERSION: SHX5443

## 2013-03-08 HISTORY — PX: CARDIOVERSION: SHX1299

## 2013-03-08 SURGERY — ECHOCARDIOGRAM, TRANSESOPHAGEAL
Anesthesia: General

## 2013-03-08 MED ORDER — HYDROCORTISONE 0.5 % EX CREA
TOPICAL_CREAM | Freq: Two times a day (BID) | CUTANEOUS | Status: DC
  Administered 2013-03-08 – 2013-03-09 (×3): via TOPICAL
  Filled 2013-03-08: qty 28.35

## 2013-03-08 MED ORDER — FENTANYL CITRATE 0.05 MG/ML IJ SOLN
INTRAMUSCULAR | Status: AC
  Filled 2013-03-08: qty 2

## 2013-03-08 MED ORDER — MIDAZOLAM HCL 5 MG/ML IJ SOLN
INTRAMUSCULAR | Status: AC
  Filled 2013-03-08: qty 2

## 2013-03-08 MED ORDER — PROPOFOL 10 MG/ML IV BOLUS
INTRAVENOUS | Status: DC | PRN
  Administered 2013-03-08: 40 mg via INTRAVENOUS

## 2013-03-08 MED ORDER — BUTAMBEN-TETRACAINE-BENZOCAINE 2-2-14 % EX AERO
INHALATION_SPRAY | CUTANEOUS | Status: DC | PRN
  Administered 2013-03-08: 2 via TOPICAL

## 2013-03-08 MED ORDER — MIDAZOLAM HCL 10 MG/2ML IJ SOLN
INTRAMUSCULAR | Status: DC | PRN
  Administered 2013-03-08 (×2): 1 mg via INTRAVENOUS

## 2013-03-08 MED ORDER — AMIODARONE IV BOLUS ONLY 150 MG/100ML
150.0000 mg | Freq: Once | INTRAVENOUS | Status: AC
  Administered 2013-03-08: 150 mg via INTRAVENOUS

## 2013-03-08 MED ORDER — LIDOCAINE HCL (CARDIAC) 20 MG/ML IV SOLN
INTRAVENOUS | Status: DC | PRN
  Administered 2013-03-08: 20 mg via INTRAVENOUS

## 2013-03-08 MED ORDER — FENTANYL CITRATE 0.05 MG/ML IJ SOLN
INTRAMUSCULAR | Status: DC | PRN
  Administered 2013-03-08: 25 ug via INTRAVENOUS
  Administered 2013-03-08: 12.5 ug via INTRAVENOUS

## 2013-03-08 NOTE — Discharge Summary (Signed)
[facility] Discharge Summary  Patient name: Crystal Kerr Medical record number: [id number] Date of birth: 07-Jul-1924 Age: 80 y.o. Gender: female Date of Admission: 03/04/2013  Date of Discharge: 03/10/2013 Admitting Physician: Minerva Ends, MD  Primary Care Provider: Lamar Blinks, MD Consultants: Cardiology  Indication for Hospitalization: Nausea/Vomitting/Diarrhea and atrial fibrillation  Discharge Diagnoses/Problem List:  - Viral gastroenteritis - Atrial fibrillation - Diastolic CHF - Asthma   Disposition: Home with home PT   Discharge Condition: Improved  Discharge Exam:  Gen: well-appearing elderly woman, lying comfortably in bed, in NAD CV: regular rate and rhythm, no murmurs/rubs/gallops Pulm: minimal expiratory wheezes lung bases L>R, good air movement throughout, no crackles or rhonchi Abd: abdomen soft, non tender, non distended, no rebound or guarding, +bs Ext: No LE edema or rashes Neuro: no focal deficits   Brief Hospital Course: The patient was admitted to the family medicine teaching service with gastroenteritis, mild dehydration, and atrial fibrillation. A brief hospital course by problem list is as follows:  # Nausea/vomitting/diarrhea: pt remained afebrile during hosptialization, blood cultures negative, C. Diff negative. She was started on empiric vanc/zosyn however this was discontinued on hospital day 2 and her symptoms were presumed to be viral in origin. Pt given supportive care with gentle hydration (NS@50cc /hr), immodium, and zofran. Pt did not have any additional episodes of emesis following admission but continued to have loose non-bloody bowel movements. These resolved spontaneously on 2/24.   #Atrial fibrillation: The was found to be in a-fib with RVR in the ED and given amiodarone bolus followed by continuous amiodarone infusion. She was admitted to the cardiac step-down unit and placed on telemetry.  Cardiology was consulted who recommended TEE and cardioversion pending resolution of GI symptoms. She underwent TEE with cardioversion on 2/25 and converted to normal sinus rhythm following 3rd attempt at 200J. Pt tolerated the procedure well and received 150mg  amiodarone bolus following procedure to hold sinus rhythm. She remained in NSR for the remainder of her hospital stay. She will followup with cardiology as an outpatient.   #Asthma: Pt had an asthma exacerbation on the evening of 2/25 relieved with albuterol nebulizer. She was started on a 5 day prednisone taper which she will finish on 03/15/13. She otherwise remained asymptomatic from a pulmonary standpoint during her stay.  Issues for Follow Up: Pt to follow up with cardiology outpatient  Significant Procedures: TEE and cardioversion on 03/08/13  Significant Labs and Imaging:   Recent Labs Lab 03/04/13 1220 03/05/13 0320 03/06/13 1002  WBC 13.3* 8.4 7.3  HGB 15.7* 12.8 11.9*  HCT 46.2* 38.0 36.2  PLT 281 218 218    Recent Labs Lab 03/04/13 1132 03/05/13 0320 03/06/13 1002 03/07/13 0940  NA 138 132* 140 141  K 4.1 3.3* 4.2 3.8  CL 100 98 108 105  CO2 16* 21 20 25   GLUCOSE 158* 104* 86 89  BUN 50* 48* 20 11  CREATININE 2.12* 1.72* 1.17* 0.93  CALCIUM 9.1 8.2* 8.3* 8.4  ALKPHOS 108  --   --   --   AST 37  --   --   --   ALT 39*  --   --   --   ALBUMIN 3.4*  --   --   --     TEE 03/08/13:  - Left ventricle: The cavity size was normal. Wall thickness was normal. Systolic function was normal. The estimated ejection fraction was in the range of 55% to 60%. Wall motion was normal; there  were no regional wall motion abnormalities. - Aortic valve: Lambl's excrescence noted. There was no stenosis. - Aorta: Normal caliber with mild plaque. - Mitral valve: Trivial regurgitation. - Left atrium: The atrium was mildly to moderately dilated. No evidence of thrombus in the atrial cavity or appendage. - Right ventricle: The  cavity size was normal. Systolic function was normal. - Right atrium: No evidence of thrombus in the atrial cavity or appendage. - Atrial septum: No defect or patent foramen ovale was identified. Echo contrast study showed no right-to-left atrial level shunt, at baseline or with provocation. - Tricuspid valve: Peak RV-RA gradient: 7mm Hg (S).   Results/Tests Pending at Time of Discharge:  None  Discharge Medications:    Medication List    STOP taking these medications       loperamide 2 MG tablet  Commonly known as:  IMODIUM A-D      TAKE these medications       albuterol 108 (90 BASE) MCG/ACT inhaler  Commonly known as:  PROAIR HFA  Inhale 2 puffs into the lungs every 4 (four) hours as needed for wheezing or shortness of breath. 2 puffs every 4 hours as needed only  if your can't catch your breath     amiodarone 400 MG tablet  Commonly known as:  PACERONE  Take 1 tablet (400 mg total) by mouth daily fro 1 week then 200 mg daily      apixaban 5 MG Tabs tablet  Commonly known as:  ELIQUIS  Take 1 tablet (5 mg total) by mouth 2 (two) times daily.     diltiazem 240 MG 24 hr capsule  Commonly known as:  CARDIZEM CD  Take 1 capsule (240 mg total) by mouth daily.     hydrocortisone cream 0.5 %  Apply topically 2 (two) times daily.     montelukast 10 MG tablet  Commonly known as:  SINGULAIR  Take 10 mg by mouth at bedtime.     omeprazole 20 MG capsule  Commonly known as:  PRILOSEC  Take 20 mg by mouth daily.     SYMBICORT 160-4.5 MCG/ACT inhaler  Generic drug:  budesonide-formoterol  Take 2 puffs first thing in am and then another 2 puffs about 12 hours later.        Discharge Instructions: Please refer to Patient Instructions section of EMR for full details.  Patient was counseled important signs and symptoms that should prompt return to medical care, changes in medications, dietary instructions, activity restrictions, and follow up appointments.   Follow-Up  Appointments: Pt will be contacted by cardiology in order to schedule a follow up appointment in 1-2 weeks. And is instructed to schedule appointment for f/u with PCP within the next 7-10 days.  Britta Mccreedy, Med Student 03/08/2013, 9:56 AM MS4, [facility] Family Medicine  Teaching Service Addendum.   I have seen and evaluated this pt and agree with assessment and plan as it is documented on this note.   D. [place] Philippa Sicks, MD  Family Medicine PGY-3

## 2013-03-08 NOTE — Progress Notes (Signed)
Family Medicine Teaching Service Daily Progress Note Intern Pager: [phone]  Patient name: Crystal Kerr Medical record number: [id number] Date of birth: [birth year] Age: 80 y.o. Gender: female  Primary Care Provider: Lamar Blinks, MD Consultants: Cardiology Code Status: Full code  Pt Overview and Major Events to Date:  2/21 - Admitted.  Diarrhea overnight 2/22 - CDiff pending; Afib - Rate controlled on Amiodarone 2/23- C diff negative, flu negative. Remains in A-fib on amiodarone. Pt may not want cardioversion.  2/24- Diarrhea resolved, opting for cardioversion 2/25- ECHO and cardioversion  Assessment and Plan: Crystal Kerr is a 80 y.o. female presenting with nausea/vomiting/diarrhea. In the ED, patient was febrile and met SIRS criteria. Additionally, patient found to be in Afib with RVR. PMH is significant for Atrial fibrillation (on Eliquis and followed by cardiology), Diastolic heart failure (preserved EF 65%), and asthma.   Nausea/Vomiting/Diarrhea - Resolved. Patient with meeting SIRS criteria on admission (tachycardia, leukocytosis, fever). Chest x-ray revealed questionable basilar infiltrates. UA negative. This is likely viral in origin given presentation.  - Vanc/Zosyn started empirically (2/21-2/23).Repeat CXR shows no infiltrate.  - Zofran PRN nausea (pt has not needed) - loperamide PRN diarrhea   Atrial fibrillation with RVR  - Cardiology following; appreciate recs.   - ECHO and cardioversion today, pt converted to sinus rhythm on 3rd cardioversion attempt. To continue amiodarone  - Had new oxygen requirement of 2L yesterday. Will wean at tolerated. - Continuing anticoagulation with Eliquis, resumed normal dose at 29m BID per cardiology.   Dehydration/AKI  - Patient with a baseline creatinine of 1.1, elevated to 2.1 on admission.  - Creatinine back to baseline following IV fluids - Pt tolerating PO, d/c IVF  Hypokalemia  - Secondary to diarrhea -  Repleting orally as needed  Hx of Diastolic CHF  - Monitor fluid status closely.  No exacerbation currently  Asthma  - Continue home Symbicort and Singulair   FEN/GI: Regular diet, KVO. Prophylaxis: Eliquis   Disposition: SDU; Pending discontinuation of amiodarone drip. Request PT eval as pt appears significantly deconditioned.   Subjective: Pt taken for TEE and cardioversion today, tolerated procedure but reports lots of pain on her chest wall that feels like a skin burn. Otherwise has no complaints.   Objective: Temp:  [97.9 F (36.6 C)-98.4 F (36.9 C)] 98.2 F (36.8 C) (02/25 0910) Pulse Rate:  [76-104] 84 (02/25 0910) Resp:  [18-26] 24 (02/25 0910) BP: (110-151)/(41-70) 151/70 mmHg (02/25 0910) SpO2:  [96 %-99 %] 98 % (02/25 0910)  Physical Exam: General: resting in bed, appears tired following procedure, in NAD.  Cardiovascular: Regular rate and rhythm. No murmur. Exquisite tenderness over anterior chest wall from cardioversion, minimally erythematous. Respiratory: No wheezes or crackles noted in anterior lung fields Abdomen: soft, nontender, nondistended. Positive bowel sounds. No rebound or guarding.  Extremities: No LE edema.  Neuro: No focal deficits   Laboratory:  Recent Labs Lab 03/04/13 1220 03/05/13 0320 03/06/13 1002  WBC 13.3* 8.4 7.3  HGB 15.7* 12.8 11.9*  HCT 46.2* 38.0 36.2  PLT 281 218 218    Recent Labs Lab 03/04/13 1132 03/05/13 0320 03/06/13 1002 03/07/13 0940  NA 138 132* 140 141  K 4.1 3.3* 4.2 3.8  CL 100 98 108 105  CO2 16* 21 20 25   BUN 50* 48* 20 11  CREATININE 2.12* 1.72* 1.17* 0.93  CALCIUM 9.1 8.2* 8.3* 8.4  PROT 7.5  --   --   --   BILITOT 0.4  --   --   --  ALKPHOS 108  --   --   --   ALT 39*  --   --   --   AST 37  --   --   --   GLUCOSE 158* 104* 86 89   Imaging/Diagnostic Tests:  Dg Chest Portable 1 View  03/04/2013 IMPRESSION: Cardiomegaly and mild pulmonary edema. Superimposed atelectasis or infection in the  lung bases is not excluded.   Britta Mccreedy, Med Student 03/08/2013, 9:40 AM MS4, Lane Intern pager: [phone], text pages welcome  Teaching Service Addendum. I have seen and evaluated this pt and agree with assessment and plan as is documented on this note.  Pt had Cardioversion done today will await fro cards recommendation regarding disposition.    Physical exam: Gen:  No in acute distress. Cooperative with physical exam. HEENT: Moist mucous membranes.  CV: Regular rate and rhythm, no murmurs rubs or gallops. Skin tenderness on anterior chest form procedure PULM: Clear to auscultation bilaterally. No wheezes/rales or rhonchi ABD: Soft, non tender, non distended, normal bowel sounds.  EXT: Well perfused,no edema Neuro: Grossly intact. No neurologic focalization.   D. [place]oto Philippa Sicks, MD Family Medicine  PGY-3

## 2013-03-08 NOTE — CV Procedure (Signed)
Procedure: TEE  Indication: Atrial fibrillation, pre-cardioversion  Sedation: Versed 2 mg IV, Fentanyl 25 mcg IV  Findings: Please see echo section for full report.  Normal LV size and systolic function, EF 77-41%.  Normal RV size and systolic function.  Mild to moderate LAE.  Trivial MR.  No LAA thrombus.  Grade III plaque in descending thoracic aorta.    May proceed to cardioversion.   Loralie Champagne 03/08/2013 10:35 AM

## 2013-03-08 NOTE — Interval H&P Note (Signed)
History and Physical Interval Note:  03/08/2013 10:18 AM  Crystal Kerr  has presented today for surgery, with the diagnosis of afib  The various methods of treatment have been discussed with the patient and family. After consideration of risks, benefits and other options for treatment, the patient has consented to  Procedure(s): TRANSESOPHAGEAL ECHOCARDIOGRAM (TEE) (N/A) CARDIOVERSION (N/A) as a surgical intervention .  The patient's history has been reviewed, patient examined, no change in status, stable for surgery.  I have reviewed the patient's chart and labs.  Questions were answered to the patient's satisfaction.     Louanna Vanliew [employer]

## 2013-03-08 NOTE — Anesthesia Preprocedure Evaluation (Signed)
Anesthesia Evaluation  Patient identified by MRN, date of birth, ID band Patient awake    Reviewed: Allergy & Precautions, H&P , NPO status , Patient's Chart, lab work & pertinent test results, reviewed documented beta blocker date and time   Airway Mallampati: II TM Distance: >3 FB Neck ROM: full    Dental   Pulmonary shortness of breath, asthma , former smoker,  breath sounds clear to auscultation        Cardiovascular +CHF + dysrhythmias Atrial Fibrillation + Valvular Problems/Murmurs Rhythm:regular     Neuro/Psych negative neurological ROS  negative psych ROS   GI/Hepatic negative GI ROS, Neg liver ROS,   Endo/Other  negative endocrine ROS  Renal/GU negative Renal ROS  negative genitourinary   Musculoskeletal   Abdominal   Peds  Hematology negative hematology ROS (+)   Anesthesia Other Findings See surgeon's H&P   Reproductive/Obstetrics negative OB ROS                           Anesthesia Physical Anesthesia Plan  ASA: III  Anesthesia Plan: General   Post-op Pain Management:    Induction: Intravenous  Airway Management Planned: Mask  Additional Equipment:   Intra-op Plan:   Post-operative Plan:   Informed Consent: I have reviewed the patients History and Physical, chart, labs and discussed the procedure including the risks, benefits and alternatives for the proposed anesthesia with the patient or authorized representative who has indicated his/her understanding and acceptance.   Dental Advisory Given  Plan Discussed with: CRNA and Surgeon  Anesthesia Plan Comments:         Anesthesia Quick Evaluation

## 2013-03-08 NOTE — Transfer of Care (Signed)
Immediate Anesthesia Transfer of Care Note  Patient: Crystal Kerr  Procedure(s) Performed: Procedure(s): TRANSESOPHAGEAL ECHOCARDIOGRAM (TEE) (N/A) CARDIOVERSION (N/A)  Patient Location: Endoscopy Unit  Anesthesia Type:General  Level of Consciousness: awake and patient cooperative  Airway & Oxygen Therapy: Patient Spontanous Breathing and Patient connected to nasal cannula oxygen  Post-op Assessment: Report given to PACU RN and Post -op Vital signs reviewed and stable  Post vital signs: Reviewed and stable  Complications: No apparent anesthesia complications

## 2013-03-08 NOTE — Evaluation (Signed)
Physical Therapy Evaluation Patient Details Name: Crystal Kerr MRN: [id number] DOB: [birth year] Today's Date: 03/08/2013 Time: 1245-8099 PT Time Calculation (min): 34 min  PT Assessment / Plan / Recommendation History of Present Illness  80 yo F with history of A fib on eliquis and diltiazem admitted for RVR in the setting of diarrhea x 12 hrs. She was recently admitted one month ago and treated for a UTI with keflex. Diarrhea has resolved and pt now in NSR after TEE with cardioversion.  Clinical Impression  Pt ambulated 12' with RW and min A, she was encouraged because was having difficulties even standing due to fatigue before cardioversion. Pt will benefit from PT to increase stamina and safety with mobility for d/c home. Recommend HHPT at d/c.    PT Assessment  Patient needs continued PT services    Follow Up Recommendations  Home health PT;Supervision for mobility/OOB    Does the patient have the potential to tolerate intense rehabilitation      Barriers to Discharge   pt's daughter reports they have family that can stay with pt while daughter is at work, if needed    Equipment Recommendations  None recommended by PT    Recommendations for Other Services OT consult   Frequency Min 3X/week    Precautions / Restrictions Precautions Precautions: Fall Precaution Comments: weakness and inactivity in recent weeks Restrictions Weight Bearing Restrictions: No   Pertinent Vitals/Pain O2 sats 95% on RA, no pain      Mobility  Bed Mobility Overal bed mobility: Needs Assistance Bed Mobility: Sidelying to Sit Sidelying to sit: Min assist;HOB elevated General bed mobility comments: pt used rail today and HOB elevated, min A for stability as pt came to edge but able to get legs off bed on her own Transfers Overall transfer level: Needs assistance Equipment used: Rolling walker (2 wheeled) Transfers: Sit to/from Stand Sit to Stand: Min assist General transfer  comment: min A to steady and vc's for proper hand placement with RW Ambulation/Gait Ambulation/Gait assistance: Min assist Ambulation Distance (Feet): 12 Feet Assistive device: Rolling walker (2 wheeled) Gait Pattern/deviations: Step-through pattern;Decreased stride length Gait velocity: decreased Gait velocity interpretation: Below normal speed for age/gender General Gait Details: pt encouraged that she was able to ambulate because before cardioversion could barely tolerate standing due to fatigue. Min A for safety and to steady but pt with much better tolerance for activity that before procedure    Exercises General Exercises - Lower Extremity Ankle Circles/Pumps: AROM;Both;10 reps;Seated   PT Diagnosis: Generalized weakness;Difficulty walking  PT Problem List: Decreased activity tolerance;Decreased balance;Decreased mobility;Decreased knowledge of use of DME;Decreased knowledge of precautions;Cardiopulmonary status limiting activity PT Treatment Interventions: DME instruction;Gait training;Functional mobility training;Therapeutic activities;Therapeutic exercise;Balance training;Patient/family education     PT Goals(Current goals can be found in the care plan section) Acute Rehab PT Goals Patient Stated Goal: return home PT Goal Formulation: With patient Time For Goal Achievement: 03/22/13 Potential to Achieve Goals: Good  Visit Information  Last PT Received On: 03/08/13 Assistance Needed: +1 History of Present Illness: 80 yo F with history of A fib on eliquis and diltiazem admitted for RVR in the setting of diarrhea x 12 hrs. She was recently admitted one month ago and treated for a UTI with keflex. Diarrhea has resolved and pt now in NSR after TEE with cardioversion.       Prior [place] expects to be discharged to:: Private residence Living Arrangements: Children Available Help at Discharge: Family;Available PRN/intermittently Type  of Home:  Apartment Home Access: Level entry Home Layout: One level Home Equipment: [place] - 2 wheels;Shower seat Additional Comments: pt lives with daughter who works during day. Pt was OK at home alone during day before she became sick. Began to have SOB with bathing/ dressing and household ambulation. Daughter began to need to help her. They have a RW from daughter's knee surgeries Prior Function Level of Independence: Needs assistance Gait / Transfers Assistance Needed: none until last 2 weeks ADL's / Homemaking Assistance Needed: assist only for cooking/ housework until recently Communication Communication: No difficulties    Cognition  Cognition Arousal/Alertness: Awake/alert Behavior During Therapy: WFL for tasks assessed/performed Overall Cognitive Status: Within Functional Limits for tasks assessed    Extremity/Trunk Assessment Upper Extremity Assessment Upper Extremity Assessment: Overall WFL for tasks assessed Lower Extremity Assessment Lower Extremity Assessment: RLE deficits/detail;LLE deficits/detail RLE Deficits / Details: pt tests 4/5 throughout LE but fatigues quickly with functional activity LLE Deficits / Details: same as RLE Cervical / Trunk Assessment Cervical / Trunk Assessment: Normal   Balance Balance Overall balance assessment: Needs assistance Sitting-balance support: No upper extremity supported;Feet supported Sitting balance-Leahy Scale: Good Standing balance support: Bilateral upper extremity supported;During functional activity Standing balance-Leahy Scale: Poor Standing balance comment: needed UE support to maintain standing today  End of Session PT - End of Session Equipment Utilized During Treatment: Gait belt Activity Tolerance: Patient tolerated treatment well Patient left: in chair;with call bell/phone within reach;with family/visitor present Nurse Communication: Mobility status  GP   Leighton Roach, PT  Acute Rehab Services  [phone]   Leighton Roach 03/08/2013, 3:40 PM

## 2013-03-08 NOTE — Progress Notes (Signed)
FMTS ATTENDING  NOTE George Alcantar,MD I  have seen and examined this patient, reviewed their chart. I have discussed this patient with the resident. I agree with the resident's findings, assessment and care plan. 

## 2013-03-08 NOTE — Progress Notes (Signed)
  Echocardiogram Echocardiogram Transesophageal has been performed.  Basilia Jumbo 03/08/2013, 10:53 AM

## 2013-03-08 NOTE — Procedures (Signed)
Electrical Cardioversion Procedure Note Crystal Kerr [id number] [birth year]  Procedure: Electrical Cardioversion Indications:  Atrial Fibrillation.  The patient has been on Eliquis and had TEE with no LAA thrombus.   Procedure Details Consent: Risks of procedure as well as the alternatives and risks of each were explained to the (patient/caregiver).  Consent for procedure obtained. Time Out: Verified patient identification, verified procedure, site/side was marked, verified correct patient position, special equipment/implants available, medications/allergies/relevent history reviewed, required imaging and test results available.  Performed  Patient placed on cardiac monitor, pulse oximetry, supplemental oxygen as necessary.  Sedation given: Propofol per anesthesiology. Pacer pads placed anterior and posterior chest.  Cardioverted 3 time(s). The 2nd and 3rd cardioversions were done with sternal pressure.  She converted to NSR with the 3rd shock. Cardioverted at [place].  Evaluation Findings: Post procedure EKG shows: NSR Complications: None Patient did tolerate procedure well.  Will bolus amiodarone 150 mg IV to try to hold NSR as she was difficult to cardiovert.    Loralie Champagne 03/08/2013, 10:35 AM

## 2013-03-08 NOTE — Anesthesia Postprocedure Evaluation (Signed)
Anesthesia Post Note  Patient: Crystal Kerr  Procedure(s) Performed: Procedure(s) (LRB): TRANSESOPHAGEAL ECHOCARDIOGRAM (TEE) (N/A) CARDIOVERSION (N/A)  Anesthesia type: General  Patient location: PACU  Post pain: Pain level controlled  Post assessment: Patient's Cardiovascular Status Stable  Last Vitals:  Filed Vitals:   03/08/13 1115  BP: 134/54  Pulse: 62  Temp:   Resp: 23    Post vital signs: Reviewed and stable  Level of consciousness: alert  Complications: No apparent anesthesia complications

## 2013-03-08 NOTE — H&P (View-Only) (Signed)
Subjective:  Denies CP, has mild dyspnea. Diarrhea has resolved.    Marland Kitchen apixaban  5 mg Oral Q12H  . budesonide-formoterol  2 puff Inhalation BID  . montelukast  10 mg Oral QHS  . pantoprazole  40 mg Oral Daily  . sodium chloride  3 mL Intravenous Q12H   . sodium chloride Stopped (03/07/13 2106)  . sodium chloride    . sodium chloride 20 mL/hr at 03/07/13 2106  . amiodarone (NEXTERONE PREMIX) 360 mg/200 mL dextrose 30 mg/hr (03/08/13 0256)   Objective:  Filed Vitals:   03/08/13 0000 03/08/13 0028 03/08/13 0416 03/08/13 0727  BP:  110/41 130/53 136/44  Pulse: 81 77 77 76  Temp: 97.9 F (36.6 C)  98.3 F (36.8 C) 98.4 F (36.9 C)  TempSrc: Oral  Oral Oral  Resp: 23 23 22 20   Height:      Weight:      SpO2: 98% 98% 98% 99%   Intake/Output from previous day:  Intake/Output Summary (Last 24 hours) at 03/08/13 5427 Last data filed at 03/08/13 0600  Gross per 24 hour  Intake 1680.4 ml  Output    400 ml  Net 1280.4 ml    Physical Exam: Physical exam: Well-developed well-nourished in no acute distress.  Skin is warm and dry.  HEENT is normal.  Neck is supple.   Chest is clear to auscultation with normal expansion.  Cardiovascular exam is irregular Abdominal exam nontender or distended. No masses palpated. Extremities show no edema. neuro grossly intact  Lab Results: Basic Metabolic Panel:  Recent Labs  03/06/13 1002 03/07/13 0940  NA 140 141  K 4.2 3.8  CL 108 105  CO2 20 25  GLUCOSE 86 89  BUN 20 11  CREATININE 1.17* 0.93  CALCIUM 8.3* 8.4   CBC:  Recent Labs  03/06/13 1002  WBC 7.3  HGB 11.9*  HCT 36.2  MCV 84.6  PLT 218   ECHO 02/05/2013  - Left ventricle: The cavity size was normal. Wall thickness was at the upper limits of normal. Systolic function was vigorous. The estimated ejection fraction was in the range of 65% to 70%. Wall motion was normal; there were no regional wall motion abnormalities. Features are consistent with a  pseudonormal left ventricular filling pattern, with concomitant abnormal relaxation and increased filling pressure (grade 2 diastolic dysfunction). - Aortic valve: Mildly calcified annulus. Trileaflet. No significant regurgitation. - Mitral valve: Trivial regurgitation. - Left atrium: The atrium was moderately dilated. - Right atrium: The atrium was moderately dilated. Central venous pressure: 86mm Hg (est). - Atrial septum: There was increased thickness of the septum, consider lipomatous hypertrophy. This is not well viasualized. - Tricuspid valve: Trivial regurgitation. - Pulmonary arteries: Systolic pressure could not be accurately estimated. - Pericardium, extracardiac: An apparentprominent pericardial fat pad was present - noted circumferentially. If there is any clinical concern foranother pericardial process, CT imaging could further define. Impressions:  - Upper normal LV wall thickness with LVEF 06-23%, grade 2 diastolic dysfunction. Moderate biatrial enlargement. Trivial mitral and tricuspid regurgitation. Unable to assess PASP. The atrial septum is thickened - consider lipomatous hypertrophy, not well defined. Also apparent prominent epicardial fat pad, circumferential. If there is any clinical concern for other pericardial process, could consider CT imaging to further define.    Assessment/Plan:   1. Atrial fibrillation - the patient remains in atrial fibrillation today, rate controlled. She is symptomatic with improved dyspnea on exertion. Continue IV amiodarone; rate is improved. This will also be  helpful to maintain sinus rhythm following cardioversion. We will increase he apixaban dose to 5 mg by mouth twice a day as Crea has improved. Her diarrhea has resolved.  TEE/CV today.  2. Acute on chronic diastolic CHF - resolved, the patient appears euvolemic  3. Diarrhea - possible viral gastroenteritis. Resolved.  4. Acute kidney disease - resolved, possible  contribution from dehydration from recent diarrhea. There may also be a component of decreased perfusion related to atrial fibrillation.    Ena Dawley, H 03/08/2013, 8:23 AM

## 2013-03-08 NOTE — Progress Notes (Signed)
 Subjective:  Denies CP, has mild dyspnea. Diarrhea has resolved.    . apixaban  5 mg Oral Q12H  . budesonide-formoterol  2 puff Inhalation BID  . montelukast  10 mg Oral QHS  . pantoprazole  40 mg Oral Daily  . sodium chloride  3 mL Intravenous Q12H   . sodium chloride Stopped (03/07/13 2106)  . sodium chloride    . sodium chloride 20 mL/hr at 03/07/13 2106  . amiodarone (NEXTERONE PREMIX) 360 mg/200 mL dextrose 30 mg/hr (03/08/13 0256)   Objective:  Filed Vitals:   03/08/13 0000 03/08/13 0028 03/08/13 0416 03/08/13 0727  BP:  110/41 130/53 136/44  Pulse: 81 77 77 76  Temp: 97.9 F (36.6 C)  98.3 F (36.8 C) 98.4 F (36.9 C)  TempSrc: Oral  Oral Oral  Resp: 23 23 22 20  Height:      Weight:      SpO2: 98% 98% 98% 99%   Intake/Output from previous day:  Intake/Output Summary (Last 24 hours) at 03/08/13 0823 Last data filed at 03/08/13 0600  Gross per 24 hour  Intake 1680.4 ml  Output    400 ml  Net 1280.4 ml    Physical Exam: Physical exam: Well-developed well-nourished in no acute distress.  Skin is warm and dry.  HEENT is normal.  Neck is supple.   Chest is clear to auscultation with normal expansion.  Cardiovascular exam is irregular Abdominal exam nontender or distended. No masses palpated. Extremities show no edema. neuro grossly intact  Lab Results: Basic Metabolic Panel:  Recent Labs  03/06/13 1002 03/07/13 0940  NA 140 141  K 4.2 3.8  CL 108 105  CO2 20 25  GLUCOSE 86 89  BUN 20 11  CREATININE 1.17* 0.93  CALCIUM 8.3* 8.4   CBC:  Recent Labs  03/06/13 1002  WBC 7.3  HGB 11.9*  HCT 36.2  MCV 84.6  PLT 218   ECHO 02/05/2013  - Left ventricle: The cavity size was normal. Wall thickness was at the upper limits of normal. Systolic function was vigorous. The estimated ejection fraction was in the range of 65% to 70%. Wall motion was normal; there were no regional wall motion abnormalities. Features are consistent with a  pseudonormal left ventricular filling pattern, with concomitant abnormal relaxation and increased filling pressure (grade 2 diastolic dysfunction). - Aortic valve: Mildly calcified annulus. Trileaflet. No significant regurgitation. - Mitral valve: Trivial regurgitation. - Left atrium: The atrium was moderately dilated. - Right atrium: The atrium was moderately dilated. Central venous pressure: 3mm Hg (est). - Atrial septum: There was increased thickness of the septum, consider lipomatous hypertrophy. This is not well viasualized. - Tricuspid valve: Trivial regurgitation. - Pulmonary arteries: Systolic pressure could not be accurately estimated. - Pericardium, extracardiac: An apparentprominent pericardial fat pad was present - noted circumferentially. If there is any clinical concern foranother pericardial process, CT imaging could further define. Impressions:  - Upper normal LV wall thickness with LVEF 65-70%, grade 2 diastolic dysfunction. Moderate biatrial enlargement. Trivial mitral and tricuspid regurgitation. Unable to assess PASP. The atrial septum is thickened - consider lipomatous hypertrophy, not well defined. Also apparent prominent epicardial fat pad, circumferential. If there is any clinical concern for other pericardial process, could consider CT imaging to further define.    Assessment/Plan:   1. Atrial fibrillation - the patient remains in atrial fibrillation today, rate controlled. She is symptomatic with improved dyspnea on exertion. Continue IV amiodarone; rate is improved. This will also be   helpful to maintain sinus rhythm following cardioversion. We will increase he apixaban dose to 5 mg by mouth twice a day as Crea has improved. Her diarrhea has resolved.  TEE/CV today.  2. Acute on chronic diastolic CHF - resolved, the patient appears euvolemic  3. Diarrhea - possible viral gastroenteritis. Resolved.  4. Acute kidney disease - resolved, possible  contribution from dehydration from recent diarrhea. There may also be a component of decreased perfusion related to atrial fibrillation.    Ena Dawley, H 03/08/2013, 8:23 AM

## 2013-03-09 DIAGNOSIS — R0609 Other forms of dyspnea: Secondary | ICD-10-CM | POA: Diagnosis not present

## 2013-03-09 DIAGNOSIS — A419 Sepsis, unspecified organism: Secondary | ICD-10-CM | POA: Diagnosis not present

## 2013-03-09 DIAGNOSIS — I509 Heart failure, unspecified: Secondary | ICD-10-CM | POA: Diagnosis not present

## 2013-03-09 DIAGNOSIS — I5033 Acute on chronic diastolic (congestive) heart failure: Secondary | ICD-10-CM | POA: Diagnosis not present

## 2013-03-09 DIAGNOSIS — R0602 Shortness of breath: Secondary | ICD-10-CM

## 2013-03-09 DIAGNOSIS — R197 Diarrhea, unspecified: Secondary | ICD-10-CM | POA: Diagnosis not present

## 2013-03-09 DIAGNOSIS — I4891 Unspecified atrial fibrillation: Secondary | ICD-10-CM | POA: Diagnosis not present

## 2013-03-09 LAB — MRSA PCR SCREENING: MRSA by PCR: NEGATIVE

## 2013-03-09 MED ORDER — ALBUTEROL SULFATE HFA 108 (90 BASE) MCG/ACT IN AERS: 2.0000 | INHALATION_SPRAY | RESPIRATORY_TRACT | Status: DC | PRN

## 2013-03-09 MED ORDER — IPRATROPIUM-ALBUTEROL 0.5-2.5 (3) MG/3ML IN SOLN
3.0000 mL | RESPIRATORY_TRACT | Status: DC
  Administered 2013-03-09: 3 mL via RESPIRATORY_TRACT

## 2013-03-09 MED ORDER — PREDNISONE 10 MG PO TABS: 10.0000 mg | ORAL_TABLET | Freq: Every day | ORAL | Status: DC

## 2013-03-09 MED ORDER — PREDNISONE 20 MG PO TABS
30.0000 mg | ORAL_TABLET | Freq: Every day | ORAL | Status: DC
  Filled 2013-03-09: qty 1

## 2013-03-09 MED ORDER — ALBUTEROL SULFATE (2.5 MG/3ML) 0.083% IN NEBU
INHALATION_SOLUTION | RESPIRATORY_TRACT | Status: AC
  Filled 2013-03-09: qty 3

## 2013-03-09 MED ORDER — IPRATROPIUM-ALBUTEROL 0.5-2.5 (3) MG/3ML IN SOLN: 3.0000 mL | RESPIRATORY_TRACT | Status: DC | PRN

## 2013-03-09 MED ORDER — PREDNISONE 50 MG PO TABS
50.0000 mg | ORAL_TABLET | Freq: Every day | ORAL | Status: AC
  Administered 2013-03-09: 50 mg via ORAL
  Filled 2013-03-09: qty 1

## 2013-03-09 MED ORDER — PREDNISONE 20 MG PO TABS
40.0000 mg | ORAL_TABLET | Freq: Every day | ORAL | Status: AC
  Administered 2013-03-10: 40 mg via ORAL
  Filled 2013-03-09: qty 2

## 2013-03-09 MED ORDER — PREDNISONE 20 MG PO TABS: 20.0000 mg | ORAL_TABLET | Freq: Every day | ORAL | Status: DC

## 2013-03-09 MED ORDER — AMIODARONE HCL 200 MG PO TABS
400.0000 mg | ORAL_TABLET | Freq: Every day | ORAL | Status: DC
  Administered 2013-03-09 – 2013-03-10 (×2): 400 mg via ORAL
  Filled 2013-03-09 (×2): qty 2

## 2013-03-09 MED ORDER — IPRATROPIUM-ALBUTEROL 0.5-2.5 (3) MG/3ML IN SOLN
3.0000 mL | Freq: Four times a day (QID) | RESPIRATORY_TRACT | Status: DC
  Administered 2013-03-09 – 2013-03-10 (×3): 3 mL via RESPIRATORY_TRACT
  Filled 2013-03-09 (×5): qty 3

## 2013-03-09 MED ORDER — MOMETASONE FURO-FORMOTEROL FUM 200-5 MCG/ACT IN AERO
2.0000 | INHALATION_SPRAY | Freq: Two times a day (BID) | RESPIRATORY_TRACT | Status: DC
  Filled 2013-03-09: qty 8.8

## 2013-03-09 NOTE — Progress Notes (Signed)
FMTS Attending Note  I personally saw and evaluated the patient. The plan of care was discussed with the resident team. I agree with the assessment and plan as documented by the resident.   Elynor Kallenberger MD 

## 2013-03-09 NOTE — Progress Notes (Signed)
Family Medicine Teaching Service Daily Progress Note Intern Pager: [phone]  Patient name: Crystal Kerr Medical record number: [id number] Date of birth: 06/13/1924 Age: 80 y.o. Gender: female  Primary Care Provider: Lamar Blinks, MD Consultants: Cardiology Code Status: Full code  Pt Overview and Major Events to Date:  2/21 - Admitted.  Diarrhea overnight 2/22 - CDiff pending; Afib - Rate controlled on Amiodarone 2/23- C diff negative, flu negative. Remains in A-fib on amiodarone. Pt may not want cardioversion.  2/24- Diarrhea resolved, opting for cardioversion 2/25- ECHO and cardioversion 2/26 - NSR, HR controlled, Amiodarone 497m PO per Cards, possible DC tomorrow  Assessment and Plan: Crystal VILLESCASis a 80y.o. female presenting with nausea/vomiting/diarrhea. In the ED, patient was febrile and met SIRS criteria. Additionally, patient found to be in Afib with RVR. PMH is significant for Atrial fibrillation (on Eliquis and followed by cardiology), Diastolic heart failure (preserved EF 65%), and asthma.   # Atrial fibrillation with RVR  - Cardiology following; appreciate recs.   - ECHO (2/25) normal EF without evidence of thrombus - s/p Cardioversion (2/25), pt converted to sinus rhythm on 3rd cardioversion attempt - Start Amiodarone 4040mPO x 7 days, reduced dose to 20065maily. Hold betablockers - Continuing anticoagulation with Eliquis, resumed normal dose at 5mg30mD per cardiology  # Nausea/Vomiting/Diarrhea, suspected viral gastroenteritis - Resolved Patient with meeting SIRS criteria on admission (tachycardia, leukocytosis, fever). Chest x-ray revealed questionable basilar infiltrates. UA negative. This is likely viral in origin given presentation.  - DC'd Vanc/Zosyn started empirically (2/21-2/23).Repeat CXR shows no infiltrate.  - Zofran PRN nausea (pt has not needed) - loperamide PRN diarrhea  # Dehydration/AKI - Improved - Patient with a baseline  creatinine of 1.1, elevated to 2.1 on admission.  - Creatinine back to baseline following IV fluids - Pt tolerating PO, d/c IVF  # Hypokalemia - Resolved - Secondary to diarrhea - Repleting orally as needed  # Hx of Diastolic CHF  - Monitor fluid status closely.  No exacerbation currently - Had new oxygen requirement of 2L yesterday. Will wean at tolerated.  # Asthma  - Continue home Symbicort and Singulair   FEN/GI: Regular diet, KVO. Prophylaxis: Eliquis   Disposition: SDU; s/p cardioversion, tolerating Amiodarone PO with NSR, pending continued improvement and clearance from Cardiology expect [facility] be [facility] - c[facility]s CM [facility] given recent acute change with significant weakness from atrial fibrillation (s/p cardioversion) and recent viral gastroenteritis  Subjective: No acute events overnight. Yesterday completed cardioversion, currently remains in NSR. Today patient feels well, no longer c/o of pain from cardioversion, but c/o of itching. Reports wheezing last night, improved with albuterol nebs, continues to improve with breathing treatments. Tolerating PO, [facility] weak [facility]  Objective: Temp:  [98.4 F (36.9 C)-98.8 F (37.1 C)] 98.8 F (37.1 C) (02/26 0400) Pulse Rate:  [62-81] 72 (02/26 0500) Resp:  [18-32] 20 (02/26 0500) BP: (116-144)/(43-52) 144/52 mmHg (02/26 0400) SpO2:  [94 %-99 %] 98 % (02/26 0932)  Physical Exam: General: sitting up in bed receiving breathing treatment, tired but well-appearing, NAD Cardiovascular: RRR. No murmur Respiratory: Mostly CTAB, no wheezing, crackles, or rhonchi. Breath sounds mildly diminished. Normal WOB Abdomen: soft, NTND, +active BS Extremities: No LE edema, non-tender Neuro: AAOx3, no focal deficits   Laboratory:  Recent Labs Lab 03/04/13 1220 03/05/13 0320 03/06/13 1002  WBC 13.3* 8.4 7.3  HGB 15.7*  12.8 11.9*  HCT 46.2* 38.0 36.2  PLT 281  218 218    Recent Labs Lab 03/04/13 1132 03/05/13 0320 03/06/13 1002 03/07/13 0940  NA 138 132* 140 141  K 4.1 3.3* 4.2 3.8  CL 100 98 108 105  CO2 16* 21 20 25   BUN 50* 48* 20 11  CREATININE 2.12* 1.72* 1.17* 0.93  CALCIUM 9.1 8.2* 8.3* 8.4  PROT 7.5  --   --   --   BILITOT 0.4  --   --   --   ALKPHOS 108  --   --   --   ALT 39*  --   --   --   AST 37  --   --   --   GLUCOSE 158* 104* 86 89   Imaging/Diagnostic Tests:  Dg Chest Portable 1 View  03/04/2013 IMPRESSION: Cardiomegaly and mild pulmonary edema. Superimposed atelectasis or infection in the lung bases is not excluded.  2/25 2D ECHO Left ventricle: The cavity size was normal. Wall thickness was normal. Systolic function was normal. The estimated ejection fraction was in the range of 55% to 60%. Wall motion was normal; there were no regional wall motion abnormalities. - Aortic valve: Lambl's excrescence noted. There was no stenosis. - Aorta: Normal caliber with mild plaque. - Mitral valve: Trivial regurgitation. - Left atrium: The atrium was mildly to moderately dilated. No evidence of thrombus in the atrial cavity or appendage. - Right ventricle: The cavity size was normal. Systolic function was normal. - Right atrium: No evidence of thrombus in the atrial cavity or appendage. - Atrial septum: No defect or patent foramen ovale was identified. Echo contrast study showed no right-to-left atrial level shunt, at baseline or with provocation. - Tricuspid valve: Peak RV-RA gradient: 19m Hg (S).   ANobie Putnam DO 03/09/2013, 11:27 AM [place] PGY-1 [place] pager: [phone] text pages welcome

## 2013-03-09 NOTE — Progress Notes (Signed)
Subjective:  Denies CP, had acute onset SOB the last night consistent with known asthma, relieved by albuterol/atrovent nebulizers, she denies SOB right now.     Meds;  . albuterol      . apixaban  5 mg Oral Q12H  . budesonide-formoterol  2 puff Inhalation BID  . hydrocortisone cream   Topical BID  . ipratropium-albuterol  3 mL Inhalation Q6H  . montelukast  10 mg Oral QHS  . pantoprazole  40 mg Oral Daily  . sodium chloride  3 mL Intravenous Q12H   . sodium chloride Stopped (03/07/13 2106)  . sodium chloride    . sodium chloride 20 mL/hr at 03/07/13 2106  . amiodarone (NEXTERONE PREMIX) 360 mg/200 mL dextrose 150 mg/hr (03/08/13 1104)   Objective:  Filed Vitals:   03/09/13 0228 03/09/13 0300 03/09/13 0400 03/09/13 0500  BP:   144/52   Pulse: 71 72 81 72  Temp:   98.8 F (37.1 C)   TempSrc:   Oral   Resp: 26 19 20 20   Height:      Weight:      SpO2: 95% 97% 97% 97%   Intake/Output from previous day:  Intake/Output Summary (Last 24 hours) at 03/09/13 0721 Last data filed at 03/08/13 1900  Gross per 24 hour  Intake   1524 ml  Output    250 ml  Net   1274 ml    Physical Exam: Physical exam: Well-developed well-nourished in no acute distress.  Skin is warm and dry.  HEENT is normal.  Neck is supple.   Chest is clear to auscultation with normal expansion.  Cardiovascular exam is irregular Abdominal exam nontender or distended. No masses palpated. Extremities show no edema. neuro grossly intact  Lab Results: Basic Metabolic Panel:  Recent Labs  03/06/13 1002 03/07/13 0940  NA 140 141  K 4.2 3.8  CL 108 105  CO2 20 25  GLUCOSE 86 89  BUN 20 11  CREATININE 1.17* 0.93  CALCIUM 8.3* 8.4   CBC:  Recent Labs  03/06/13 1002  WBC 7.3  HGB 11.9*  HCT 36.2  MCV 84.6  PLT 218   ECHO 02/05/2013  - Left ventricle: The cavity size was normal. Wall thickness was at the upper limits of normal. Systolic function was vigorous. The estimated ejection  fraction was in the range of 65% to 70%. Wall motion was normal; there were no regional wall motion abnormalities. Features are consistent with a pseudonormal left ventricular filling pattern, with concomitant abnormal relaxation and increased filling pressure (grade 2 diastolic dysfunction). - Aortic valve: Mildly calcified annulus. Trileaflet. No significant regurgitation. - Mitral valve: Trivial regurgitation. - Left atrium: The atrium was moderately dilated. - Right atrium: The atrium was moderately dilated. Central venous pressure: 51mm Hg (est). - Atrial septum: There was increased thickness of the septum, consider lipomatous hypertrophy. This is not well viasualized. - Tricuspid valve: Trivial regurgitation. - Pulmonary arteries: Systolic pressure could not be accurately estimated. - Pericardium, extracardiac: An apparentprominent pericardial fat pad was present - noted circumferentially. If there is any clinical concern foranother pericardial process, CT imaging could further define. Impressions:  - Upper normal LV wall thickness with LVEF 18-84%, grade 2 diastolic dysfunction. Moderate biatrial enlargement. Trivial mitral and tricuspid regurgitation. Unable to assess PASP. The atrial septum is thickened - consider lipomatous hypertrophy, not well defined. Also apparent prominent epicardial fat pad, circumferential. If there is any clinical concern for other pericardial process, could consider CT imaging to further  define.    Assessment/Plan:   1. Atrial fibrillation - the patient was cardioverted yesterday, remains in SR, we will switch to oral amiodarone 400 mg po daily x 7 days then switch to 200 mg po daily. Beta-blockers are contraindicated as she has active asthma. Continue apixaban.   2. Acute on chronic diastolic CHF - resolved, the patient appears euvolemic  3. Diarrhea - possible viral gastroenteritis. Resolved.  4. Acute kidney disease - resolved,  possible contribution from dehydration from recent diarrhea. There may also be a component of decreased perfusion related to atrial fibrillation.   5. Asthma - we will continue nebulizers, she will need advair 500/50 BID to take home as she only has albuterol. We will start prednisone taper starting 50 mg po today, 40 mg tomorrow, 30 mg, 20 mg, 10 mg for 5 days total.   Crystal Kerr, H 03/09/2013, 7:21 AM

## 2013-03-09 NOTE — Progress Notes (Signed)
Called to patient's room.  Patient stated, " I'm having an asthma attack ".  Audible wheezes heard in large airways without stethoscope.  Patient slightly tachypneic with RR=24-28 bpm. O2 sat's 93% on room air.  Oxygen at 2L via nasal cannula placed on patient.  Respiratory Therapist  & Dr Colon Flattery paged.  Orders received.  RT gave breathing treatment to patient with resolution of symptoms achieved.  Rhythm remains in NSR with PAC's at present.  Will continue to monitor.

## 2013-03-10 ENCOUNTER — Encounter (HOSPITAL_COMMUNITY): Payer: Self-pay | Admitting: Cardiology

## 2013-03-10 DIAGNOSIS — I4891 Unspecified atrial fibrillation: Secondary | ICD-10-CM | POA: Diagnosis not present

## 2013-03-10 DIAGNOSIS — J45901 Unspecified asthma with (acute) exacerbation: Secondary | ICD-10-CM | POA: Diagnosis not present

## 2013-03-10 DIAGNOSIS — I509 Heart failure, unspecified: Secondary | ICD-10-CM | POA: Diagnosis not present

## 2013-03-10 DIAGNOSIS — I5033 Acute on chronic diastolic (congestive) heart failure: Secondary | ICD-10-CM | POA: Diagnosis not present

## 2013-03-10 DIAGNOSIS — R197 Diarrhea, unspecified: Secondary | ICD-10-CM | POA: Diagnosis not present

## 2013-03-10 LAB — CULTURE, BLOOD (ROUTINE X 2)
CULTURE: NO GROWTH
Culture: NO GROWTH

## 2013-03-10 MED ORDER — AMIODARONE HCL 400 MG PO TABS: 400.0000 mg | ORAL_TABLET | Freq: Every day | ORAL | Status: DC

## 2013-03-10 MED ORDER — AMIODARONE HCL 400 MG PO TABS
400.0000 mg | ORAL_TABLET | Freq: Every day | ORAL | Status: DC
Start: 2013-03-10 — End: 2013-03-10

## 2013-03-10 MED ORDER — HYDROCORTISONE 0.5 % EX CREA: TOPICAL_CREAM | Freq: Two times a day (BID) | CUTANEOUS | Status: DC

## 2013-03-10 NOTE — Discharge Summary (Signed)
FMTS ATTENDING  NOTE Myeisha Kruser,MD I  have seen and examined this patient, reviewed their chart. I have discussed this patient with the resident. I agree with the resident's findings, assessment and care plan. 

## 2013-03-10 NOTE — Progress Notes (Signed)
Discharged to home with family office visits in place teaching done  

## 2013-03-10 NOTE — Progress Notes (Signed)
Family Medicine Teaching Service Daily Progress Note Intern Pager: [phone]  Patient name: Crystal Kerr Medical record number: [id number] Date of birth: 02-21-1924 Age: 80 y.o. Gender: female  Primary Care Provider: Lamar Blinks, MD Consultants: Cardiology Code Status: Full code  Pt Overview and Major Events to Date:  2/21 - Admitted.  Diarrhea overnight 2/22 - CDiff pending; Afib - Rate controlled on Amiodarone 2/23- C diff negative, flu negative. Remains in A-fib on amiodarone. Pt may not want cardioversion.  2/24- Diarrhea resolved, opting for cardioversion 2/25- ECHO and cardioversion 2/26 - NSR, HR controlled, Amiodarone 470m PO per Cards, possible DC tomorrow  Assessment and Plan: Crystal KISAMOREis a 80y.o. female presenting with nausea/vomiting/diarrhea. In the ED, patient was febrile and met SIRS criteria. Additionally, patient found to be in Afib with RVR. PMH is significant for Atrial fibrillation (on Eliquis and followed by cardiology), Diastolic heart failure (preserved EF 65%), and asthma.   # Atrial fibrillation with RVR  - Cardiology following; appreciate recs.   - ECHO (2/25) normal EF without evidence of thrombus - s/p Cardioversion (2/25), pt converted to sinus rhythm on 3rd cardioversion attempt - Start Amiodarone 4015mPO x 7 days, reduced dose to 20037maily. Hold betablockers - Continuing anticoagulation with Eliquis, resumed normal dose at 5mg24mD per cardiology  # Nausea/Vomiting/Diarrhea, suspected viral gastroenteritis - Resolved Patient with meeting SIRS criteria on admission (tachycardia, leukocytosis, fever). Chest x-ray revealed questionable basilar infiltrates. UA negative. This is likely viral in origin given presentation.  - DC'd Vanc/Zosyn started empirically (2/21-2/23).Repeat CXR shows no infiltrate.  - Zofran PRN nausea (pt has not needed) - loperamide PRN diarrhea  # Dehydration/AKI - Improved - Patient with a baseline  creatinine of 1.1, elevated to 2.1 on admission.  - Creatinine back to baseline following IV fluids - Pt tolerating PO, d/c IVF  # Hypokalemia - Resolved - Secondary to diarrhea - Repleting orally as needed  # Hx of Diastolic CHF  - Monitor fluid status closely.  No exacerbation currently - Had new oxygen requirement of 2L yesterday. Will wean at tolerated.  # Asthma  - Continue home Symbicort and Singulair   FEN/GI: Regular diet, KVO. Prophylaxis: Eliquis   Disposition: Likely home today. Pt will have home health PT and nursing for rehabilitation.  Subjective: No acute events overnight. Pt reports she feels very well this morning and feels ready to go home. No episodes of shortness of breath or wheezing requiring albuterol treatments. Remained in NSR all night.  Objective: Temp:  [97.9 F (36.6 C)-98.1 F (36.7 C)] 98.1 F (36.7 C) (02/27 [id number] Rate:  [70-115] 70 (02/27 0638) Resp:  [20] 20 (02/27 0638) BP: (122-145)/(54-62) 145/62 mmHg (02/27 0638) SpO2:  [92 %-98 %] 95 % (02/27 [id number] Exam: General: lying in bed, pleasant and conversant, in NAD Cardiovascular: RRR. No murmur Respiratory: Mostly CTAB, trace wheezing in lung bases L>R, no crackles or rhonchi Abdomen: soft, NTND, +active BS Extremities: No LE edema, non-tender Neuro: AAOx3, no focal deficits   Laboratory:  Recent Labs Lab 03/04/13 1220 03/05/13 0320 03/06/13 1002  WBC 13.3* 8.4 7.3  HGB 15.7* 12.8 11.9*  HCT 46.2* 38.0 36.2  PLT 281 218 218    Recent Labs Lab 03/04/13 1132 03/05/13 0320 03/06/13 1002 03/07/13 0940  NA 138 132* 140 141  K 4.1 3.3* 4.2 3.8  CL 100 98 108 105  CO2 16* _0 BUN 50* 48* 20 11  CREATININE 2.12* 1.72* 1.17*  0.93  CALCIUM 9.1 8.2* 8.3* 8.4  PROT 7.5  --   --   --   BILITOT 0.4  --   --   --   ALKPHOS 108  --   --   --   ALT 39*  --   --   --   AST 37  --   --   --   GLUCOSE 158* 104* 86 89   Imaging/Diagnostic Tests:  Dg Chest  Portable 1 View  03/04/2013 IMPRESSION: Cardiomegaly and mild pulmonary edema. Superimposed atelectasis or infection in the lung bases is not excluded.  2/25 2D ECHO Left ventricle: The cavity size was normal. Wall thickness was normal. Systolic function was normal. The estimated ejection fraction was in the range of 55% to 60%. Wall motion was normal; there were no regional wall motion abnormalities. - Aortic valve: Lambl's excrescence noted. There was no stenosis. - Aorta: Normal caliber with mild plaque. - Mitral valve: Trivial regurgitation. - Left atrium: The atrium was mildly to moderately dilated. No evidence of thrombus in the atrial cavity or appendage. - Right ventricle: The cavity size was normal. Systolic function was normal. - Right atrium: No evidence of thrombus in the atrial cavity or appendage. - Atrial septum: No defect or patent foramen ovale was identified. Echo contrast study showed no right-to-left atrial level shunt, at baseline or with provocation. - Tricuspid valve: Peak RV-RA gradient: 28m Hg (S).   KBritta Mccreedy Med Student 03/10/2013, 9:11 AM [place] MS4 [place]Intern pager: [phone] text pages welcome  Teaching Service Addendum.  I have seen and evaluated this pt and agree with assessment and plan as is documented on this note.  Rate and rhythm stable after cardioversion. Will discharge home with f/u per Cardiology and primary care doctor..Marland Kitchen  Physical exam:  Gen: No in acute distress. Cooperative with physical exam.  HEENT: Moist mucous membranes.  CV: Regular rate and rhythm, no murmurs rubs or gallops. Skin tenderness on anterior chest form procedure  PULM: Clear to auscultation bilaterally. No wheezes/rales or rhonchi  ABD: Soft, non tender, non distended, normal bowel sounds.  EXT: Well perfused,no edema  Neuro: Grossly intact. No neurologic focalization.   D. [place]oto DPhilippa Sicks MD  Family Medicine  PGY-3

## 2013-03-10 NOTE — Progress Notes (Signed)
Subjective:  Denies CP, had acute onset SOB the last night consistent with known asthma, relieved by albuterol/atrovent nebulizers, she denies SOB right now.   He is very upset about her procedure being postponed on Tuesday and is screaming without stopping.   Meds;  . amiodarone  400 mg Oral Daily  . apixaban  5 mg Oral Q12H  . budesonide-formoterol  2 puff Inhalation BID  . hydrocortisone cream   Topical BID  . ipratropium-albuterol  3 mL Inhalation Q6H  . mometasone-formoterol  2 puff Inhalation BID  . montelukast  10 mg Oral QHS  . pantoprazole  40 mg Oral Daily  . [START ON 03/13/2013] predniSONE  10 mg Oral Q breakfast  . [START ON 03/12/2013] predniSONE  20 mg Oral Q breakfast  . [START ON 03/11/2013] predniSONE  30 mg Oral Q breakfast  . sodium chloride  3 mL Intravenous Q12H   . sodium chloride Stopped (03/07/13 2106)  . sodium chloride    . sodium chloride 20 mL/hr at 03/07/13 2106   Objective:  Filed Vitals:   03/09/13 2019 03/09/13 2218 03/10/13 0638 03/10/13 1043  BP:  122/56 145/62   Pulse:  115 70   Temp:  97.9 F (36.6 C) 98.1 F (36.7 C)   TempSrc:  Oral Oral   Resp:  20 20   Height:      Weight:      SpO2: 92% 93% 95% 96%   Intake/Output from previous day:  Intake/Output Summary (Last 24 hours) at 03/10/13 1153 Last data filed at 03/10/13 0730  Gross per 24 hour  Intake    480 ml  Output      0 ml  Net    480 ml    Physical Exam: Physical exam: Well-developed well-nourished in no acute distress.  Skin is warm and dry.  HEENT is normal.  Neck is supple.   Chest is clear to auscultation with normal expansion.  Cardiovascular exam is irregular Abdominal exam nontender or distended. No masses palpated. Extremities show no edema. neuro grossly intact  ECHO 02/05/2013  - Left ventricle: The cavity size was normal. Wall thickness was at the upper limits of normal. Systolic function was vigorous. The estimated ejection fraction was in the  range of 65% to 70%. Wall motion was normal; there were no regional wall motion abnormalities. Features are consistent with a pseudonormal left ventricular filling pattern, with concomitant abnormal relaxation and increased filling pressure (grade 2 diastolic dysfunction). - Aortic valve: Mildly calcified annulus. Trileaflet. No significant regurgitation. - Mitral valve: Trivial regurgitation. - Left atrium: The atrium was moderately dilated. - Right atrium: The atrium was moderately dilated. Central venous pressure: 58mm Hg (est). - Atrial septum: There was increased thickness of the septum, consider lipomatous hypertrophy. This is not well viasualized. - Tricuspid valve: Trivial regurgitation. - Pulmonary arteries: Systolic pressure could not be accurately estimated. - Pericardium, extracardiac: An apparentprominent pericardial fat pad was present - noted circumferentially. If there is any clinical concern foranother pericardial process, CT imaging could further define. Impressions:  - Upper normal LV wall thickness with LVEF 00-17%, grade 2 diastolic dysfunction. Moderate biatrial enlargement. Trivial mitral and tricuspid regurgitation. Unable to assess PASP. The atrial septum is thickened - consider lipomatous hypertrophy, not well defined. Also apparent prominent epicardial fat pad, circumferential. If there is any clinical concern for other pericardial process, could consider CT imaging to further define.  Telemetry: SR, very frequent PACs   Assessment/Plan:   1. Atrial fibrillation -  the patient was cardioverted on 2/25, remains in SR, with very frequent PACs. we will switch to oral amiodarone 400 mg po daily x 7 days then switch to 200 mg po daily. Beta-blockers are contraindicated as she has active asthma. Continue apixaban.   2. Acute on chronic diastolic CHF - resolved, the patient appears euvolemic  3. Diarrhea - possible viral gastroenteritis. Resolved.  4.  Acute kidney disease - resolved, possible contribution from dehydration from recent diarrhea. There may also be a component of decreased perfusion related to atrial fibrillation.   5. Asthma - we will continue nebulizers, she will need advair 500/50 BID to take home as she only has albuterol. We will start prednisone taper starting 50 mg po today, 40 mg tomorrow, 30 mg, 20 mg, 10 mg for 5 days total.  She can be discharged today with follow up in our clinic.   Ena Dawley, H 03/10/2013, 11:53 AM

## 2013-03-10 NOTE — Progress Notes (Signed)
FMTS ATTENDING  NOTE Crystal Samson,MD I  have seen and examined this patient, reviewed their chart. I have discussed this patient with the resident. I agree with the resident's findings, assessment and care plan.  Patient doing well post cardioversion,she remains in sinus rhythm,denies any concern today, her diarrhea resolved completely. She is stable for d/c home pending cardiology sign off.

## 2013-03-10 NOTE — Progress Notes (Signed)
Scheduler's line is busy. I have left a message on our office's scheduling voicemail requesting a follow-up appointment in 1-2 weeks, and our office will call the patient with this appointment. Dayna Dunn PA-C

## 2013-03-10 NOTE — Discharge Instructions (Signed)
Information on my medicine - ELIQUIS® (apixaban) °Why was Eliquis® prescribed for you? °Eliquis® was prescribed for you to reduce the risk of a blood clot forming that can cause a stroke if you have a medical condition called atrial fibrillation (a type of irregular heartbeat). ° °What do You need to know about Eliquis® ? °Take your Eliquis® TWICE DAILY - one tablet in the morning and one tablet in the evening with or without food. If you have difficulty swallowing the tablet whole please discuss with your pharmacist how to take the medication safely. ° °Take Eliquis® exactly as prescribed by your doctor and DO NOT stop taking Eliquis® without talking to the doctor who prescribed the medication.  Stopping may increase your risk of developing a stroke.  Refill your prescription before you run out. ° °After discharge, you should have regular check-up appointments with your healthcare provider that is prescribing your Eliquis®.  In the future your dose may need to be changed if your kidney function or weight changes by a significant amount or as you get older. ° °What do you do if you miss a dose? °If you miss a dose, take it as soon as you remember on the same day and resume taking twice daily.  Do not take more than one dose of ELIQUIS at the same time to make up a missed dose. ° °Important Safety Information °A possible side effect of Eliquis® is bleeding. You should call your healthcare provider right away if you experience any of the following: °  Bleeding from an injury or your nose that does not stop. °  Unusual colored urine (red or dark brown) or unusual colored stools (red or black). °  Unusual bruising for unknown reasons. °  A serious fall or if you hit your head (even if there is no bleeding). ° °Some medicines may interact with Eliquis® and might increase your risk of bleeding or clotting while on Eliquis®. To help avoid this, consult your healthcare provider or pharmacist prior to using any new  prescription or non-prescription medications, including herbals, vitamins, non-steroidal anti-inflammatory drugs (NSAIDs) and supplements. ° °This website has more information on Eliquis® (apixaban): www.Eliquis.com. ° °

## 2013-03-14 ENCOUNTER — Other Ambulatory Visit: Payer: Self-pay | Admitting: Family Medicine

## 2013-03-15 ENCOUNTER — Other Ambulatory Visit: Payer: Self-pay | Admitting: Family Medicine

## 2013-03-16 ENCOUNTER — Other Ambulatory Visit: Payer: Self-pay | Admitting: *Deleted

## 2013-03-16 MED ORDER — APIXABAN 5 MG PO TABS: 5.0000 mg | ORAL_TABLET | Freq: Two times a day (BID) | ORAL | Status: DC

## 2013-03-21 ENCOUNTER — Telehealth: Payer: Self-pay | Admitting: Internal Medicine

## 2013-03-21 MED ORDER — BUDESONIDE-FORMOTEROL FUMARATE 160-4.5 MCG/ACT IN AERO: INHALATION_SPRAY | RESPIRATORY_TRACT | Status: DC

## 2013-03-21 NOTE — Telephone Encounter (Signed)
Rx has been sent in. Pt is aware. 

## 2013-03-24 ENCOUNTER — Ambulatory Visit (INDEPENDENT_AMBULATORY_CARE_PROVIDER_SITE_OTHER): Payer: Medicare Other | Admitting: Adult Health

## 2013-03-24 ENCOUNTER — Encounter: Payer: Self-pay | Admitting: Adult Health

## 2013-03-24 VITALS — BP 138/64 | HR 66 | Temp 98.1°F | Ht 63.0 in | Wt 178.6 lb

## 2013-03-24 DIAGNOSIS — J45909 Unspecified asthma, uncomplicated: Secondary | ICD-10-CM | POA: Diagnosis not present

## 2013-03-24 NOTE — Progress Notes (Signed)
   Subjective:    Patient ID: Crystal Kerr, female    DOB: [birth year] MRN: [id number]  HPI 49 yowf quit smoking 1983 prev eval Dr Joya Gaskins dx with asthma  Referred back to pulmonary clinic 02/17/13 by Dr Rod Can.   02/17/2013 1st [facility] office visit/ Wert cc new sob/cough since Mid Dec 2014 with white mucus worse day than night while on advair and better p albuterol for up to 6 h day = night s purulent sputum >pred pack and symbicort rx   03/24/2013 Follow up  Returns for 2 week follow up Asthma .  Reports breathing 50-60% improved since last ov. Still having DOE but better.  Still weak. Was recently admitted for Gastroenteritis .  Had asthma flare during hospital stay, tx w/ steroid taper.  No fever, discolored mucus, chest pain, orthopnea, or edema.  Does feel the symbicort is helping quite a bit.  Also prilosec is helping her reflux .     Current Medications, Allergies, Complete Past Medical History, Past Surgical History, Family History, and Social History were reviewed in [employer] record.           Review of Systems  Constitutional: Negative for fever, chills and unexpected weight change.  HENT: Negative for congestion, dental problem, ear pain, nosebleeds, postnasal drip, rhinorrhea, sinus pressure, sneezing, sore throat, trouble swallowing and voice change.   Eyes: Negative for visual disturbance.  Respiratory: Positive for and shortness of breath. Negative for choking.   Cardiovascular: Negative for chest pain and leg swelling.  Gastrointestinal: Negative for vomiting, abdominal pain and diarrhea.  Genitourinary: Negative for difficulty urinating.  Musculoskeletal: Positive for arthralgias.  Skin: Negative for rash.  Neurological: Negative for tremors, syncope and headaches.  Hematological: Does not bruise/bleed easily.       Objective:   Physical Exam amb wf nad  HEENT: nl dentition, turbinates, and orophanx. Nl external  ear canals without cough reflex   NECK :  without JVD/Nodes/TM/ nl carotid upstrokes bilaterally   LUNGS: no acc muscle use, clear to A and P bilaterally without cough on insp or exp maneuvers   CV:  RRR  no s3 or murmur or increase in P2, no edema   ABD:  soft and nontender with nl excursion in the supine position. No bruits or organomegaly, bowel sounds nl  MS:  warm without deformities, calf tenderness, cyanosis or clubbing  SKIN: warm and dry without lesions    NEURO:  alert, approp, no deficits   cxr 02/08/13 Mildly increased density at the right lung base suggests progressive atelectasis. There is no alveolar pneumonia nor pleural effusion nor evidence of CHF.  03/06/13 CXR >There is prominence of interstitial markings,  similar in appearance to most recent exams. There are no new consolidations. No pleural effusions.     Assessment & Plan:

## 2013-03-24 NOTE — Assessment & Plan Note (Addendum)
Improved compensation on symbicort  Cont to tx for triggers ie GERD - w/ PPI   Plan  Continue on current regimen.  Follow up Dr. Melvyn Novas  In 2 months and As needed   Please contact office for sooner follow up if symptoms do not improve or worsen or seek emergency care

## 2013-03-24 NOTE — Patient Instructions (Signed)
Continue on current regimen.  Follow up Dr. Melvyn Novas  In 2 months and As needed   Please contact office for sooner follow up if symptoms do not improve or worsen or seek emergency care

## 2013-03-28 ENCOUNTER — Encounter: Payer: Self-pay | Admitting: Cardiovascular Disease

## 2013-03-28 ENCOUNTER — Ambulatory Visit (INDEPENDENT_AMBULATORY_CARE_PROVIDER_SITE_OTHER): Payer: Medicare Other | Admitting: Cardiovascular Disease

## 2013-03-28 VITALS — BP 138/62 | HR 66 | Ht 63.0 in | Wt 175.0 lb

## 2013-03-28 DIAGNOSIS — I4891 Unspecified atrial fibrillation: Secondary | ICD-10-CM

## 2013-03-28 DIAGNOSIS — I4819 Other persistent atrial fibrillation: Secondary | ICD-10-CM

## 2013-03-28 DIAGNOSIS — I4949 Other premature depolarization: Secondary | ICD-10-CM

## 2013-03-28 DIAGNOSIS — I493 Ventricular premature depolarization: Secondary | ICD-10-CM

## 2013-03-28 MED ORDER — AMIODARONE HCL 200 MG PO TABS: 200.0000 mg | ORAL_TABLET | Freq: Every day | ORAL | Status: DC

## 2013-03-28 NOTE — Progress Notes (Signed)
HPI  This is an 80 year old female who is here today for a followup visit regarding atrial fibrillation. She was seen last year for evaluation of dizziness and PVCs. She was noted to be orthostatic by physical exam. Echocardiogram showed normal LV systolic function without significant valvular abnormalities. Holter monitor confirmed excessive PVCs. 20% of her beats were actually PVCs. She was started on diltiazem extended release 120 mg once daily with significant improvement in her symptoms.  She was hospitalized at [facility] in February for vital gastroenteritis. She was noted to be in atrial fibrillation with rapid ventricular response. She underwent TEE guided cardioversion and was placed on amiodarone. She has been maintaining sinus rhythm since then. She has been doing well since hospital discharge and denies any chest pain, dyspnea or palpitations.   No Known Allergies   Current Outpatient Prescriptions on File Prior to Visit  Medication Sig Dispense Refill  . albuterol (PROAIR HFA) 108 (90 BASE) MCG/ACT inhaler Inhale 2 puffs into the lungs every 4 (four) hours as needed for wheezing or shortness of breath. 2 puffs every 4 hours as needed only  if your can't catch your breath  1 Inhaler  11  . amiodarone (PACERONE) 400 MG tablet Take 1 tablet (400 mg total) by mouth daily.  30 tablet  1  . apixaban (ELIQUIS) 5 MG TABS tablet Take 1 tablet (5 mg total) by mouth 2 (two) times daily.  60 tablet  0  . budesonide-formoterol (SYMBICORT) 160-4.5 MCG/ACT inhaler Take 2 puffs first thing in am and then another 2 puffs about 12 hours later.  1 Inhaler  1  . diltiazem (CARDIZEM CD) 240 MG 24 hr capsule TAKE ONE CAPSULE BY MOUTH EVERY DAY  30 capsule  0  . hydrocortisone cream 0.5 % Apply topically 2 (two) times daily.  30 g  0  . montelukast (SINGULAIR) 10 MG tablet Take 10 mg by mouth at bedtime.      Marland Kitchen omeprazole (PRILOSEC) 20 MG capsule Take 20 mg by mouth daily.       No current  facility-administered medications on file prior to visit.     Past Medical History  Diagnosis Date  . Asthma   . PVC's (premature ventricular contractions)   . Arthritis   . CHF (congestive heart failure)   . Atrial fibrillation      Past Surgical History  Procedure Laterality Date  . Appendectomy    . Cholecystectomy    . Tonsillectomy    . Oophrectomy    . Tee without cardioversion N/A 03/08/2013    Procedure: TRANSESOPHAGEAL ECHOCARDIOGRAM (TEE);  Surgeon: Larey Dresser, MD;  Location: [place];  Service: Cardiovascular;  Laterality: N/A;  . Cardioversion N/A 03/08/2013    Procedure: CARDIOVERSION;  Surgeon: Larey Dresser, MD;  Location: [facility] ENDOSCOPY;  Service: Cardiovascular;  Laterality: N/A;     Family History  Problem Relation Age of Onset  . Heart disease      No family history     History   Social History  . Marital Status: Unknown    Spouse Name: N/A    Number of Children: N/A  . Years of Education: N/A   Occupational History  . Retired    Social History Main Topics  . Smoking status: Former Smoker -- 1.00 packs/day for 20 years    Types: Cigarettes    Quit date: 04/13/1981  . Smokeless tobacco: Never Used  . Alcohol Use: Yes     Comment: Occasional  .  Drug Use: No  . Sexual Activity: No   Other Topics Concern  . Not on file   Social History Narrative  . No narrative on file      PHYSICAL EXAM   BP 138/62  Pulse 66  Ht 5\' 3"  (1.6 m)  Wt 175 lb (79.379 kg)  BMI 31.01 kg/m2  Constitutional: She is oriented to person, place, and time. She appears well-developed and well-nourished. No distress.  HENT: No nasal discharge.  Head: Normocephalic and atraumatic.  Eyes: Pupils are equal and round. Right eye exhibits no discharge. Left eye exhibits no discharge.  Neck: Normal range of motion. Neck supple. No JVD present. No thyromegaly present.  Cardiovascular: Normal rate with occasional premature beats, regular rhythm, normal heart  sounds. Exam reveals no gallop and no friction rub. No murmur heard.  Pulmonary/Chest: Effort normal and breath sounds normal. No stridor. No respiratory distress. She has no wheezes. She has no rales. She exhibits no tenderness.  Abdominal: Soft. Bowel sounds are normal. She exhibits no distension. There is no tenderness. There is no rebound and no guarding.  Musculoskeletal: Normal range of motion. She exhibits no edema and no tenderness.  Neurological: She is alert and oriented to person, place, and time. Coordination normal.  Skin: Skin is warm and dry. No rash noted. She is not diaphoretic. No erythema. No pallor.  Psychiatric: She has a normal mood and affect. Her behavior is normal. Judgment and thought content normal.    EKG: Normal sinus rhythm with no significant ST or T wave changes.  ASSESSMENT AND PLAN

## 2013-03-28 NOTE — Patient Instructions (Signed)
Your physician has recommended you make the following change in your medication:    DECREASE AMIODARONE TO 200 MG ONCE A DAY. CONTINUE ALL OTHER MEDICATIONS AS LISTED.  Your physician recommends that you schedule a follow-up appointment in: [place]. Fletcher Anon.

## 2013-03-30 DIAGNOSIS — I4819 Other persistent atrial fibrillation: Secondary | ICD-10-CM | POA: Insufficient documentation

## 2013-03-30 NOTE — Assessment & Plan Note (Signed)
These improved significantly with diltiazem and amiodarone.

## 2013-03-30 NOTE — Assessment & Plan Note (Addendum)
She is maintaining a normal sinus rhythm with amiodarone. I decreased the dose to 200 mg once daily. She is also on diltiazem extended release 240 mg once daily. Continue long-term anticoagulation with Eliquis.  I will consider decreasing the dose of amiodarone to 100 mg once daily. Check liver and thyroid panel in 6 months as well as CBC and basic metabolic profile.

## 2013-04-11 ENCOUNTER — Other Ambulatory Visit: Payer: Self-pay | Admitting: *Deleted

## 2013-04-11 MED ORDER — APIXABAN 5 MG PO TABS: 5.0000 mg | ORAL_TABLET | Freq: Two times a day (BID) | ORAL | Status: DC

## 2013-04-12 ENCOUNTER — Other Ambulatory Visit: Payer: Self-pay | Admitting: Physician Assistant

## 2013-04-14 ENCOUNTER — Ambulatory Visit (INDEPENDENT_AMBULATORY_CARE_PROVIDER_SITE_OTHER): Payer: Medicare Other | Admitting: Internal Medicine

## 2013-04-14 VITALS — BP 110/54 | HR 81 | Temp 97.9°F | Resp 16 | Ht 62.0 in | Wt 177.0 lb

## 2013-04-14 DIAGNOSIS — R5383 Other fatigue: Secondary | ICD-10-CM

## 2013-04-14 DIAGNOSIS — R5381 Other malaise: Secondary | ICD-10-CM

## 2013-04-14 DIAGNOSIS — Z79899 Other long term (current) drug therapy: Secondary | ICD-10-CM

## 2013-04-14 DIAGNOSIS — J45909 Unspecified asthma, uncomplicated: Secondary | ICD-10-CM

## 2013-04-14 DIAGNOSIS — R509 Fever, unspecified: Secondary | ICD-10-CM

## 2013-04-14 LAB — BASIC METABOLIC PANEL
BUN: 19 mg/dL (ref 6–23)
CHLORIDE: 102 meq/L (ref 96–112)
CO2: 27 meq/L (ref 19–32)
Calcium: 9.5 mg/dL (ref 8.4–10.5)
Creat: 1.26 mg/dL — ABNORMAL HIGH (ref 0.50–1.10)
GLUCOSE: 89 mg/dL (ref 70–99)
POTASSIUM: 4.8 meq/L (ref 3.5–5.3)
Sodium: 139 mEq/L (ref 135–145)

## 2013-04-14 LAB — POCT CBC
GRANULOCYTE PERCENT: 75.3 % (ref 37–80)
HEMATOCRIT: 42.9 % (ref 37.7–47.9)
HEMOGLOBIN: 13.5 g/dL (ref 12.2–16.2)
LYMPH, POC: 2.3 (ref 0.6–3.4)
MCH, POC: 27.7 pg (ref 27–31.2)
MCHC: 31.5 g/dL — AB (ref 31.8–35.4)
MCV: 88 fL (ref 80–97)
MID (cbc): 0.8 (ref 0–0.9)
MPV: 10.5 fL (ref 0–99.8)
POC GRANULOCYTE: 9.4 — AB (ref 2–6.9)
POC LYMPH PERCENT: 18.3 %L (ref 10–50)
POC MID %: 6.4 % (ref 0–12)
Platelet Count, POC: 270 10*3/uL (ref 142–424)
RBC: 4.87 M/uL (ref 4.04–5.48)
RDW, POC: 15.9 %
WBC: 12.5 10*3/uL — AB (ref 4.6–10.2)

## 2013-04-14 LAB — TSH: TSH: 2.286 u[IU]/mL (ref 0.350–4.500)

## 2013-04-14 NOTE — Patient Instructions (Addendum)
Tremor Tremor is a rhythmic, involuntary muscular contraction characterized by oscillations (to-and-fro movements) of a part of the body. The most common of all involuntary movements, tremor can affect various body parts such as the hands, head, facial structures, vocal cords, trunk, and legs; most tremors, however, occur in the hands. Tremor often accompanies neurological disorders associated with aging. Although the disorder is not life-threatening, it can be responsible for functional disability and social embarrassment. TREATMENT  There are many types of tremor and several ways in which tremor is classified. The most common classification is by behavioral context or position. There are five categories of tremor within this classification: resting, postural, kinetic, task-specific, and psychogenic. Resting or static tremor occurs when the muscle is at rest, for example when the hands are lying on the lap. This type of tremor is often seen in patients with Parkinson's disease. Postural tremor occurs when a patient attempts to maintain posture, such as holding the hands outstretched. Postural tremors include physiological tremor, essential tremor, tremor with basal ganglia disease (also seen in patients with Parkinson's disease), cerebellar postural tremor, tremor with peripheral neuropathy, post-traumatic tremor, and alcoholic tremor. Kinetic or intention (action) tremor occurs during purposeful movement, for example during finger-to-nose testing. Task-specific tremor appears when performing goal-oriented tasks such as handwriting, speaking, or standing. This group consists of primary writing tremor, vocal tremor, and orthostatic tremor. Psychogenic tremor occurs in both older and younger patients. The key feature of this tremor is that it dramatically lessens or disappears when the patient is distracted. PROGNOSIS There are some treatment options available for tremor; the appropriate treatment depends on  accurate diagnosis of the cause. Some tremors respond to treatment of the underlying condition, for example in some cases of psychogenic tremor treating the patient's underlying mental problem may cause the tremor to disappear. Also, patients with tremor due to Parkinson's disease may be treated with Levodopa drug therapy. Symptomatic drug therapy is available for several other tremors as well. For those cases of tremor in which there is no effective drug treatment, physical measures such as teaching the patient to brace the affected limb during the tremor are sometimes useful. Surgical intervention such as thalamotomy or deep brain stimulation may be useful in certain cases. Document Released: 12/19/2001 Document Revised: 03/23/2011 Document Reviewed: 12/29/2004 [facility] Patient Information 2014 [place]. Hypotension As your heart beats, it forces blood through your arteries. This force is your blood pressure. If your blood pressure is too low for you to go about your normal activities or to support the organs of your body, you have hypotension. Hypotension is also referred to as low blood pressure. When your blood pressure becomes too low, you may not get enough blood to your brain. As a result, you may feel weak, feel lightheaded, or develop a rapid heart rate. In a more severe case, you may faint. CAUSES Various conditions can cause hypotension. These include:  Blood loss.  Dehydration.  Heart or endocrine problems.  Pregnancy.  Severe infection.  Not having a well-balanced diet filled with needed nutrients.  Severe allergic reactions (anaphylaxis). Some medicines, such as blood pressure medicine or water pills (diuretics), may lower your blood pressure below normal. Sometimes taking too much medicine or taking medicine not as directed can cause hypotension. TREATMENT  Hospitalization is sometimes required for hypotension if fluid or blood replacement is needed, if time is needed  for medicines to wear off, or if further monitoring is needed. Treatment might include changing your diet, changing your medicines (including  medicines aimed at raising your blood pressure), and use of support stockings. HOME CARE INSTRUCTIONS   Drink enough fluids to keep your urine clear or pale yellow.  Take your medicines as directed by your health care provider.  Get up slowly from reclining or sitting positions. This gives your blood pressure a chance to adjust.  Wear support stockings as directed by your health care provider.  Maintain a healthy diet by including nutritious food, such as fruits, vegetables, nuts, whole grains, and lean meats. SEEK MEDICAL CARE IF:  You have vomiting or diarrhea.  You have a fever for more than 2 3 days.  You feel more thirsty than usual.  You feel weak and tired. SEEK IMMEDIATE MEDICAL CARE IF:   You have chest pain or a fast or irregular heartbeat.  You have a loss of feeling in some part of your body, or you lose movement in your arms or legs.  You have trouble speaking.  You become sweaty or feel lightheaded.  You faint. MAKE SURE YOU:   Understand these instructions.  Will watch your condition.  Will get help right away if you are not doing well or get worse. Document Released: 12/29/2004 Document Revised: 10/19/2012 Document Reviewed: 07/01/2012 [facility] Patient Information 2014 [place]. Amiodarone tablets What is this medicine? AMIODARONE (a MEE oh da rone) is an antiarrhythmic drug. It helps make your heart beat regularly. Because of the side effects caused by this medicine, it is only used when other medicines have not worked. It is usually used for heartbeat problems that may be life threatening. This medicine may be used for other purposes; ask your health care provider or pharmacist if you have questions. COMMON BRAND NAME(S): Cordarone, Pacerone What should I tell my health care provider before I take this  medicine? They need to know if you have any of these conditions: -liver disease -lung disease -other heart problems -thyroid disease -an unusual or allergic reaction to amiodarone, iodine, other medicines, foods, dyes, or preservatives -pregnant or trying to get pregnant -breast-feeding How should I use this medicine? Take this medicine by mouth with a glass of water. Follow the directions on the prescription label. You can take this medicine with or without food. However, you should always take it the same way each time. Take your doses at regular intervals. Do not take your medicine more often than directed. Do not stop taking except on the advice of your doctor or health care professional. A special MedGuide will be given to you by the pharmacist with each prescription and refill. Be sure to read this information carefully each time. Talk to your pediatrician regarding the use of this medicine in children. Special care may be needed. Overdosage: If you think you have taken too much of this medicine contact a poison control center or emergency room at once. NOTE: This medicine is only for you. Do not share this medicine with others. What if I miss a dose? If you miss a dose, take it as soon as you can. If it is almost time for your next dose, take only that dose. Do not take double or extra doses. What may interact with this medicine? Do not take this medicine with any of the following medications: -abarelix -apomorphine -arsenic trioxide -certain antibiotics like erythromycin, gemifloxacin, levofloxacin, pentamidine -certain medicines for depression like amoxapine, tricyclic antidepressants -certain medicines for fungal infections like fluconazole, itraconazole, ketoconazole, posaconazole, voriconazole -certain medicines for irregular heart beat like disopyramide, dofetilide, dronedarone, ibutilide, propafenone, sotalol -  certain medicines for malaria like chloroquine,  halofantrine -cisapride -droperidol -haloperidol -hawthorn -maprotiline -methadone -phenothiazines like chlorpromazine, mesoridazine, thioridazine -pimozide -ranolazine -red yeast rice -vardenafil -ziprasidone  This medicine may also interact with the following medications: -antiviral medicines for HIV or AIDS -certain medicines for blood pressure, heart disease, irregular heart beat -certain medicines for cholesterol like atorvastatin, cerivastatin, lovastatin, simvastatin -certain medicines for seizures like phenytoin -certain medicines for thyroid problems -certain medicines that treat or prevent blood clots like warfarin -cholestyramine -cimetidine -clopidogrel -cyclosporine -dextromethorphan -diuretics -fentanyl -general anesthetics -grapefruit juice -lidocaine -loratadine -methotrexate -other medicines that prolong the QT interval (cause an abnormal heart rhythm) -procainamide -quinidine -rifabutin, rifampin, or rifapentine -St. John's Wort -trazodone This list may not describe all possible interactions. Give your health care provider a list of all the medicines, herbs, non-prescription drugs, or dietary supplements you use. Also tell them if you smoke, drink alcohol, or use illegal drugs. Some items may interact with your medicine. What should I watch for while using this medicine? Your condition will be monitored closely when you first begin therapy. Often, this drug is first started in a hospital or other monitored health care setting. Once you are on maintenance therapy, visit your doctor or health care professional for regular checks on your progress. Because your condition and use of this medicine carry some risk, it is a good idea to carry an identification card, necklace or bracelet with details of your condition, medications, and doctor or health care professional. Dennis Bast may get drowsy or dizzy. Do not drive, use machinery, or do anything that needs mental  alertness until you know how this medicine affects you. Do not stand or sit up quickly, especially if you are an older patient. This reduces the risk of dizzy or fainting spells. This medicine can make you more sensitive to the sun. Keep out of the sun. If you cannot avoid being in the sun, wear protective clothing and use sunscreen. Do not use sun lamps or tanning beds/booths. You should have regular eye exams before and during treatment. Call your doctor if you have blurred vision, see halos, or your eyes become sensitive to light. Your eyes may get dry. It may be helpful to use a lubricating eye solution or artificial tears solution. If you are going to have surgery or a procedure that requires contrast dyes, tell your doctor or health care professional that you are taking this medicine. What side effects may I notice from receiving this medicine? Side effects that you should report to your doctor or health care professional as soon as possible: -allergic reactions like skin rash, itching or hives, swelling of the face, lips, or tongue -blue-gray coloring of the skin -blurred vision, seeing blue green halos, increased sensitivity of the eyes to light -breathing problems -chest pain -dark urine -fast, irregular heartbeat -feeling faint or light-headed -intolerance to heat or cold -nausea or vomiting -pain and swelling of the scrotum -pain, tingling, numbness in feet, hands -redness, blistering, peeling or loosening of the skin, including inside the mouth -spitting up blood -stomach pain -sweating -unusual or uncontrolled movements of body -unusually weak or tired -weight gain or loss -yellowing of the eyes or skin Side effects that usually do not require medical attention (report to your doctor or health care professional if they continue or are bothersome): -change in sex drive or performance -constipation -dizziness -headache -loss of appetite -trouble sleeping This list may not  describe all possible side effects. Call your doctor for medical advice about side  effects. You may report side effects to FDA at 1-800-FDA-1088. Where should I keep my medicine? Keep out of the reach of children. Store at room temperature between 20 and 25 degrees C (68 and 77 degrees F). Protect from light. Keep container tightly closed. Throw away any unused medicine after the expiration date. NOTE: This sheet is a summary. It may not cover all possible information. If you have questions about this medicine, talk to your doctor, pharmacist, or health care provider.  2014, [employer]. (2012-09-23 13:45:38)

## 2013-04-14 NOTE — Progress Notes (Signed)
   Subjective:    Patient ID: Crystal Kerr, female    DOB: 1924-03-25, 80 y.o.   MRN: [id number]  HPI Pt here for a hospital follow up. She haves noted a lot of weakness and shakiness, shakiness started 5 months ago, but worst after hospital discharge. The shakiness is so bad she cannot do her needle point. She was hospitalized for virus that left her dihydrated that she was hospitalized for 7 days. The diarrhea, and vomiting haves resolved. Denies weakness on hands, numbness or tingling. She also noticed that due to her medicine she haves some constipation. She is eating more healthy, less salt and more vegetables.    Her family care is [name]. Overall stable and better since afib cardioverted.  Review of Systems     Objective:   Physical Exam  Constitutional: She is oriented to person, place, and time. She appears well-developed. No distress.  HENT:  Head: Normocephalic.  Eyes: EOM are normal. No scleral icterus.  Neck: Normal range of motion. No thyromegaly present.  Cardiovascular: Normal rate, regular rhythm and normal heart sounds.   No murmur heard. Pulmonary/Chest: Effort normal. Not tachypneic. She has no wheezes. She has rhonchi. She has no rales.  Abdominal: Soft. There is no tenderness.  Lymphadenopathy:    She has no cervical adenopathy.  Neurological: She is alert and oriented to person, place, and time. She has normal strength. She displays tremor. No cranial nerve deficit or sensory deficit. She exhibits normal muscle tone. Coordination and gait normal.  Psychiatric: She has a normal mood and affect. Her behavior is normal. Judgment and thought content normal.    Tremor and fatigue BP borderline low  Results for orders placed in visit on 04/14/13  POCT CBC      Result Value Ref Range   WBC 12.5 (*) 4.6 - 10.2 K/uL   Lymph, poc 2.3  0.6 - 3.4   POC LYMPH PERCENT 18.3  10 - 50 %L   MID (cbc) 0.8  0 - 0.9   POC MID % 6.4  0 - 12 %M   POC Granulocyte  9.4 (*) 2 - 6.9   Granulocyte percent 75.3  37 - 80 %G   RBC 4.87  4.04 - 5.48 M/uL   Hemoglobin 13.5  12.2 - 16.2 g/dL   HCT, POC 42.9  37.7 - 47.9 %   MCV 88.0  80 - 97 fL   MCH, POC 27.7  27 - 31.2 pg   MCHC 31.5 (*) 31.8 - 35.4 g/dL   RDW, POC 15.9     Platelet Count, POC 270  142 - 424 K/uL   MPV 10.5  0 - 99.8 fL       Assessment & Plan:  F/up with [name] Hydrate/Use salt Bmet and TSH pending

## 2013-04-15 ENCOUNTER — Encounter: Payer: Self-pay | Admitting: *Deleted

## 2013-05-25 ENCOUNTER — Other Ambulatory Visit: Payer: Self-pay | Admitting: Physician Assistant

## 2013-06-02 ENCOUNTER — Other Ambulatory Visit: Payer: Self-pay | Admitting: Internal Medicine

## 2013-07-04 ENCOUNTER — Telehealth: Payer: Self-pay | Admitting: Internal Medicine

## 2013-07-04 NOTE — Telephone Encounter (Signed)
Called spoke with pt daughter. appt scheduled for her to come in and see TP tomorrow for acute visit. Nothing further needed

## 2013-07-05 ENCOUNTER — Encounter: Payer: Self-pay | Admitting: Adult Health

## 2013-07-05 ENCOUNTER — Ambulatory Visit (INDEPENDENT_AMBULATORY_CARE_PROVIDER_SITE_OTHER): Payer: Medicare Other | Admitting: Adult Health

## 2013-07-05 VITALS — BP 122/64 | HR 85 | Temp 98.0°F | Ht 63.0 in | Wt 184.4 lb

## 2013-07-05 DIAGNOSIS — J309 Allergic rhinitis, unspecified: Secondary | ICD-10-CM | POA: Diagnosis not present

## 2013-07-05 MED ORDER — ALBUTEROL SULFATE HFA 108 (90 BASE) MCG/ACT IN AERS: 2.0000 | INHALATION_SPRAY | RESPIRATORY_TRACT | Status: DC | PRN

## 2013-07-05 NOTE — Patient Instructions (Signed)
Stop Claritin. Begin Allegra 180 mg daily as needed. For drainage, drippy nose Use Chlortrimeton 4mg  At bedtime  X 3 days for drippy nose.  Saline nasal rinses As needed   Dymista Nasal  2 puffs daily each nostril, until sample is gone. Please contact office for sooner follow up if symptoms do not improve or worsen or seek emergency care  follow up Dr. Melvyn Novas  In 6 weeks and  As needed

## 2013-07-05 NOTE — Progress Notes (Signed)
   Subjective:    Patient ID: Crystal Kerr, female    DOB: 27-May-1924 MRN: [id number]  HPI 37 yowf quit smoking 1983 prev eval Dr Joya Gaskins dx with asthma  Referred back to pulmonary clinic 02/17/13 by Dr Rod Can.   02/17/2013 1st [facility] office visit/ Wert cc new sob/cough since Mid Dec 2014 with white mucus worse day than night while on advair and better p albuterol for up to 6 h day = night s purulent sputum >pred pack and symbicort rx   03/24/2013 Follow up  Returns for 2 week follow up Asthma .  Reports breathing 50-60% improved since last ov. Still having DOE but better.  Still weak. Was recently admitted for Gastroenteritis .  Had asthma flare during hospital stay, tx w/ steroid taper.  No fever, discolored mucus, chest pain, orthopnea, or edema.  Does feel the symbicort is helping quite a bit.  Also prilosec is helping her reflux .   07/05/2013 Acute OV   MW pt here for throat congestion with clear mucus production x1 month.   Denies any dyspnea, wheezing, tightness, cough, head congestion, PND, leg swelling, f/c/s/n/v. Using claritin without much help. Taking singulair.  No SABA use.  Feels breathing is ok , w/ no wheezing or dyspnea.    Current Medications, Allergies, Complete Past Medical History, Past Surgical History, Family History, and Social History were reviewed in [employer] record.           Review of Systems  Constitutional: Negative for fever, chills and unexpected weight change.  HENT: Negative for congestion, dental problem, ear pain, nosebleeds, postnasal drip, rhinorrhea, sinus pressure, sneezing, sore throat, trouble swallowing and voice change.   Eyes: Negative for visual disturbance.  Respiratory: Positive for and shortness of breath. Negative for choking.   Cardiovascular: Negative for chest pain and leg swelling.  Gastrointestinal: Negative for vomiting, abdominal pain and diarrhea.  Genitourinary: Negative for  difficulty urinating.  Musculoskeletal: Positive for arthralgias.  Skin: Negative for rash.  Neurological: Negative for tremors, syncope and headaches.  Hematological: Does not bruise/bleed easily.       Objective:   Physical Exam amb wf nad  HEENT: nl dentition, turbinates, and orophanx. Nl external ear canals without cough reflex   NECK :  without JVD/Nodes/TM/ nl carotid upstrokes bilaterally   LUNGS: no acc muscle use, clear to A and P bilaterally without cough on insp or exp maneuvers   CV:  RRR  no s3 or murmur or increase in P2, no edema   ABD:  soft and nontender with nl excursion in the supine position. No bruits or organomegaly, bowel sounds nl  MS:  warm without deformities, calf tenderness, cyanosis or clubbing  SKIN: warm and dry without lesions    NEURO:  alert, approp, no deficits   cxr 02/08/13 Mildly increased density at the right lung base suggests progressive atelectasis. There is no alveolar pneumonia nor pleural effusion nor evidence of CHF.  03/06/13 CXR >There is prominence of interstitial markings,  similar in appearance to most recent exams. There are no new consolidations. No pleural effusions.     Assessment & Plan:

## 2013-07-05 NOTE — Assessment & Plan Note (Signed)
Stop Claritin. Begin Allegra 180 mg daily as needed. For drainage, drippy nose Use Chlortrimeton 4mg  At bedtime  X 3 days for drippy nose.  Saline nasal rinses As needed   Dymista Nasal  2 puffs daily each nostril, until sample is gone. Please contact office for sooner follow up if symptoms do not improve or worsen or seek emergency care  follow up Dr. Melvyn Novas  In 6 weeks and  As needed

## 2013-07-10 ENCOUNTER — Ambulatory Visit (INDEPENDENT_AMBULATORY_CARE_PROVIDER_SITE_OTHER)
Admission: RE | Admit: 2013-07-10 | Discharge: 2013-07-10 | Disposition: A | Discharge status: Home / Self Care | Payer: Medicare Other | Source: Ambulatory Visit | Attending: Internal Medicine | Admitting: Internal Medicine

## 2013-07-10 ENCOUNTER — Ambulatory Visit (INDEPENDENT_AMBULATORY_CARE_PROVIDER_SITE_OTHER): Payer: Medicare Other | Admitting: Internal Medicine

## 2013-07-10 ENCOUNTER — Encounter: Payer: Self-pay | Admitting: Internal Medicine

## 2013-07-10 VITALS — BP 132/66 | HR 67 | Ht 63.0 in | Wt 185.0 lb

## 2013-07-10 DIAGNOSIS — R059 Cough, unspecified: Secondary | ICD-10-CM | POA: Diagnosis not present

## 2013-07-10 DIAGNOSIS — R058 Other specified cough: Secondary | ICD-10-CM

## 2013-07-10 DIAGNOSIS — J45909 Unspecified asthma, uncomplicated: Secondary | ICD-10-CM

## 2013-07-10 DIAGNOSIS — R05 Cough: Secondary | ICD-10-CM | POA: Diagnosis not present

## 2013-07-10 MED ORDER — AZITHROMYCIN 250 MG PO TABS: ORAL_TABLET | ORAL | Status: DC

## 2013-07-10 MED ORDER — FAMOTIDINE 20 MG PO TABS: ORAL_TABLET | ORAL | Status: DC

## 2013-07-10 MED ORDER — PREDNISONE 10 MG PO TABS: ORAL_TABLET | ORAL | Status: DC

## 2013-07-10 MED ORDER — PANTOPRAZOLE SODIUM 40 MG PO TBEC: 40.0000 mg | DELAYED_RELEASE_TABLET | Freq: Every day | ORAL | Status: DC

## 2013-07-10 NOTE — Patient Instructions (Addendum)
Prednisone 10 mg take  4 each am x 2 days,  2 each am x 2 days,  1 each am x 2 days and stop   Pantoprazole (protonix) 40 mg  Take 30-60 min before first meal of the day and Pepcid 20 mg one at  bedtime and chlortrimeton 4 mg x 2 at bedtime  For drainage take chlortrimeton (chlorpheniramine) 4 mg every 4 hours available over the counter (may cause drowsiness)    Best cough medication is delsym 2 tsp every 12 hours as needed  GERD (REFLUX)  is an extremely common cause of respiratory symptoms just like yours , many times with no significant heartburn at all.    It can be treated with medication, but also with lifestyle changes including avoidance of late meals, excessive alcohol, smoking cessation, and avoid fatty foods, chocolate, peppermint, colas, red wine, and acidic juices such as orange juice.  NO MINT OR MENTHOL PRODUCTS SO NO COUGH DROPS  USE SUGARLESS CANDY INSTEAD (jolley ranchers or Stover's)  NO OIL BASED VITAMINS - use powdered substitutes.    Please remember to go to the x-ray department downstairs for your tests - we will call you with the results when they are available.  Please schedule a follow up office visit in 4 weeks, sooner if needed with all medications in hand.

## 2013-07-10 NOTE — Progress Notes (Signed)
Quick Note:  Spoke with pt and notified of results per Dr. Wert. Pt verbalized understanding and denied any questions.  ______ 

## 2013-07-10 NOTE — Progress Notes (Signed)
Subjective:    Patient ID: Crystal Kerr, female    DOB: 1924-02-22 MRN: [id number]  Brief patient profile:  20 yowf quit smoking 1983 prev eval Dr Joya Gaskins dx with asthma  Referred back to pulmonary clinic 02/17/13 by Dr Rod Can.    History of Present Illness  02/17/2013 1st [facility] office visit/ [name] cc new sob/cough since [facility] Dec 2014 with white mucus worse day than night while on advair and better p albuterol for up to 6 h day = night s purulent sputum rec Symbicort 160 Take 2 puffs first thing in am and then another 2 puffs about 12 hours later.  Only use your albuterol (proair) as a rescue medication  Prednisone 10 mg take  4 each am x 2 days,   2 each am x 2 days,  1 each am x 2 days and stop  Try prilosec 20mg   Take 30-60 min before first meal of the day and Pepcid 20 mg one bedtime until cough is completely gone for at least a week without the need for cough suppression GERD diet       03/24/2013 Follow up  Returns for 2 week follow up Asthma .  Reports breathing 50-60% improved since last ov. Still having DOE but better.  Still weak. Was recently admitted for Gastroenteritis .  Had asthma flare during hospital stay, tx w/ steroid taper.   Also prilosec is helping her reflux .   07/05/2013 Acute OV   MW pt here for throat congestion with clear mucus production x1 month.   Denies any dyspnea, wheezing, tightness, cough, head congestion, PND, leg swelling, f/c/s/n/v. Using claritin without much help. Taking singulair.  No SABA use.  Feels breathing is ok , w/ no wheezing or dyspnea.  rec Stop Claritin. Begin Allegra 180 mg daily as needed. For drainage, drippy nose Use Chlortrimeton 4mg  At bedtime  X 3 days for drippy nose.  Saline nasal rinses As needed   Dymista Nasal  2 puffs daily each nostril, until sample is gone   07/10/2013 f/u ov/Lilly[facility] Spong re: worse cough x > one month but actually "coughing daily x years"/ not on pepcid at hs Chief Complaint   Patient presents with  . Acute Visit    Pt believes she has bronchitis.  c/o fatigue, dizziness, wheezing, prod cough with yellow mucous X3 days.   cough worse at hs ? Better p saba despite reporting still taking symbicort 160 2bid  No sob unless coughing  No obvious day to day or daytime variabilty or assoc   cp or chest tightness, subjective wheeze overt sinus or hb symptoms. No unusual exp hx or h/o childhood pna/ asthma or knowledge of premature birth.  Sleeping ok without nocturnal  or early am exacerbation  of respiratory  c/o's or need for noct saba. Also denies any obvious fluctuation of symptoms with weather or environmental changes or other aggravating or alleviating factors except as outlined above   Current Medications, Allergies, Complete Past Medical History, Past Surgical History, Family History, and Social History were reviewed in [employer] record.  ROS  The following are not active complaints unless bolded sore throat, dysphagia, dental problems, itching, sneezing,  nasal congestion or excess/ purulent secretions, ear ache,   fever, chills, sweats, unintended wt loss, pleuritic or exertional cp, hemoptysis,  orthopnea pnd or leg swelling, presyncope, palpitations, heartburn, abdominal pain, anorexia, nausea, vomiting, diarrhea  or change in bowel or urinary habits, change in stools or urine,  dysuria,hematuria,  rash, arthralgias, visual complaints, headache, numbness weakness or ataxia or problems with walking or coordination,  change in mood/affect or memory.                        Objective:  Physical Exam   amb wf nad/ hoarse with prominent pseudowheeze   Wt Readings from Last 3 Encounters:  07/10/13 185 lb (83.915 kg)  07/05/13 184 lb 6.4 oz (83.643 kg)  04/14/13 177 lb (80.287 kg)      HEENT: nl dentition, turbinates, and orophanx. Nl external ear canals without cough reflex   NECK :  without JVD/Nodes/TM/ nl carotid upstrokes  bilaterally   LUNGS: no acc muscle use, clear to A and P bilaterally without cough on insp or exp maneuvers   CV:  RRR  no s3 or murmur or increase in P2, no edema   ABD:  soft and nontender with nl excursion in the supine position. No bruits or organomegaly, bowel sounds nl  MS:  warm without deformities, calf tenderness, cyanosis or clubbing  SKIN: warm and dry without lesions       CXR  07/10/2013 :  There is no edema or consolidation. The heart size and pulmonary vascularity are normal. No adenopathy. There is atherosclerotic change in the aorta. There are no appreciable bone lesions. There is mid thoracic dextroscoliosis and lower thoracic levoscoliosis.       Assessment & Plan:

## 2013-07-10 NOTE — Assessment & Plan Note (Addendum)
Vs upper airway cough syndrome (see sep h/p)  The proper method of use, as well as anticipated side effects, of a metered-dose inhaler are discussed and demonstrated to the patient. Improved effectiveness after extensive coaching during this visit to a level of approximately  75% so for now continue symbicort 160 2bid with possibility of wean down/ off /change to qvar at future ov once cough controlled

## 2013-07-10 NOTE — Assessment & Plan Note (Signed)
The most common causes of chronic cough in immunocompetent adults include the following: upper airway cough syndrome (UACS), previously referred to as postnasal drip syndrome (PNDS), which is caused by variety of rhinosinus conditions; (2) asthma; (3) GERD; (4) chronic bronchitis from cigarette smoking or other inhaled environmental irritants; (5) nonasthmatic eosinophilic bronchitis; and (6) bronchiectasis.   These conditions, singly or in combination, have accounted for up to 94% of the causes of chronic cough in prospective studies.   Other conditions have constituted no >6% of the causes in prospective studies These have included bronchogenic carcinoma, chronic interstitial pneumonia, sarcoidosis, left ventricular failure, ACEI-induced cough, and aspiration from a condition associated with pharyngeal dysfunction.    Chronic cough is often simultaneously caused by more than one condition. A single cause has been found from 38 to 82% of the time, multiple causes from 18 to 62%. Multiply caused cough has been the result of three diseases up to 42% of the time.       Based on hx and exam, this is most likely:  Either cough variant asthma (which seems unlikely as a single dx as "never gets completely better " vs   Upper airway cough syndrome, so named because it's frequently impossible to sort out how much is  CR/sinusitis with freq throat clearing (which can be related to primary GERD)   vs  causing  secondary (" extra esophageal")  GERD from wide swings in gastric pressure that occur with throat clearing, often  promoting self use of mint and menthol lozenges that reduce the lower esophageal sphincter tone and exacerbate the problem further in a cyclical fashion.   These are the same pts (now being labeled as having "irritable larynx syndrome" by some cough centers) who not infrequently have a history of having failed to tolerate ace inhibitors,  dry powder inhalers or biphosphonates or report having  atypical reflux symptoms that don't respond to standard doses of PPI , and are easily confused as having aecopd or asthma flares by even experienced allergists/ pulmonologists.   The first step is to maximize acid suppression and eliminate cyclical coughing then regroup  With all meds to do med rec first then go from there.  See instructions for specific recommendations which were reviewed directly with the patient who was given a copy with highlighter outlining the key components.

## 2013-07-12 ENCOUNTER — Other Ambulatory Visit: Payer: Self-pay | Admitting: Internal Medicine

## 2013-07-15 ENCOUNTER — Ambulatory Visit (INDEPENDENT_AMBULATORY_CARE_PROVIDER_SITE_OTHER): Payer: Medicare Other | Admitting: Family Medicine

## 2013-07-15 ENCOUNTER — Ambulatory Visit (INDEPENDENT_AMBULATORY_CARE_PROVIDER_SITE_OTHER): Payer: Medicare Other

## 2013-07-15 VITALS — BP 132/70 | HR 62 | Temp 97.8°F | Ht 63.0 in | Wt 188.0 lb

## 2013-07-15 DIAGNOSIS — R059 Cough, unspecified: Secondary | ICD-10-CM | POA: Diagnosis not present

## 2013-07-15 DIAGNOSIS — R0602 Shortness of breath: Secondary | ICD-10-CM

## 2013-07-15 DIAGNOSIS — Z8679 Personal history of other diseases of the circulatory system: Secondary | ICD-10-CM | POA: Diagnosis not present

## 2013-07-15 DIAGNOSIS — R062 Wheezing: Secondary | ICD-10-CM

## 2013-07-15 DIAGNOSIS — R05 Cough: Secondary | ICD-10-CM

## 2013-07-15 MED ORDER — DOXYCYCLINE HYCLATE 100 MG PO TABS: 100.0000 mg | ORAL_TABLET | Freq: Two times a day (BID) | ORAL | Status: DC

## 2013-07-15 MED ORDER — IPRATROPIUM BROMIDE 0.03 % NA SOLN: 2.0000 | Freq: Four times a day (QID) | NASAL | Status: DC

## 2013-07-15 NOTE — Patient Instructions (Signed)
I will be in touch with your labs asap  Try the atrovent nasal spray for drainage and mucus in your throat.  Also, try some plain mucinex (guiafensen) to thin your mucus.    If you are not feeling better in the next couple of days we can try the doxycycline rx- hold onto this for the time being however.    Call me if any concern!

## 2013-07-15 NOTE — Progress Notes (Addendum)
[facility] [address], [place] [address] [phone]  Date:  07/15/2013   Name:  Crystal Kerr   DOB:  01-23-1924   MRN:  [id number]  PCP:  Lamar Blinks, MD    Chief Complaint: Cough and Wheezing   History of Present Illness:  Crystal Kerr is a 80 y.o. very pleasant female patient who presents with the following:  Here today with persistent respiratory sx/ illness.  She was admitteded in February of this year with GI illness and a fib with RVR.  She was successfully cardioverted and is now maintained with amiodarone, eliquis per Dr. Fletcher Anon.  She is doing well in this regard, denies any CP or palpitations.   She saw pulmonology a couple of times here recently.  Seen on 6/24 and tx for AR with allegra, chlortrimeton, dymisa nasal.  Came back and was seen by Dr. Melvyn Novas on 6/29, was tx with zithromax for 5 days, prednisone taper for 6 days, pepcid and protonix.  She just finsihed her abx yesterday, and prednisone will be done tomorrow.  She and her daughter are concerned that she still has congestion in her throat, feels like there is "a frog in my throat all the time," and still has some cough.    She has wheezing, feels tired. She is using her albuterol some, has not used it since yesterday.    She has noted some SOB, but does not have orthopnea or PND  Wt Readings from Last 3 Encounters:  07/15/13 188 lb (85.276 kg)  07/10/13 185 lb (83.915 kg)  07/05/13 184 lb 6.4 oz (83.643 kg)     Patient Active Problem List   Diagnosis Date Noted  . Upper airway cough syndrome 07/10/2013  . Allergic rhinitis 07/05/2013  . Persistent atrial fibrillation 03/30/2013  . D-dimer, elevated 02/06/2013  . Asthma attack 02/06/2013  . Shortness of breath 02/06/2013  . Atrial fibrillation with RVR 02/04/2013  . Acute on chronic diastolic congestive heart failure 02/04/2013  . Arthritis of spine 07/01/2012  . Intrinsic asthma 07/01/2012  . Dyspnea  04/12/2012  . PVC's (premature ventricular contractions) 04/24/2011  . Murmur, cardiac 04/24/2011    Past Medical History  Diagnosis Date  . Asthma   . PVC's (premature ventricular contractions)   . Arthritis   . CHF (congestive heart failure)   . Atrial fibrillation     Past Surgical History  Procedure Laterality Date  . Appendectomy    . Cholecystectomy    . Tonsillectomy    . Oophrectomy    . Tee without cardioversion N/A 03/08/2013    Procedure: TRANSESOPHAGEAL ECHOCARDIOGRAM (TEE);  Surgeon: Larey Dresser, MD;  Location: [facility];  Service: Cardiovascular;  Laterality: N/A;  . Cardioversion N/A 03/08/2013    Procedure: CARDIOVERSION;  Surgeon: Larey Dresser, MD;  Location: [facility] ENDOSCOPY;  Service: Cardiovascular;  Laterality: N/A;    History  Substance Use Topics  . Smoking status: Former Smoker -- 1.00 packs/day for 20 years    Types: Cigarettes    Quit date: 04/13/1981  . Smokeless tobacco: Never Used  . Alcohol Use: Yes     Comment: Occasional    Family History  Problem Relation Age of Onset  . Heart disease      No family history    No Known Allergies  Medication list has been reviewed and updated.  Current Outpatient Prescriptions on File Prior to Visit  Medication Sig Dispense Refill  . albuterol (PROAIR HFA) 108 (  90 BASE) MCG/ACT inhaler Inhale 2 puffs into the lungs every 4 (four) hours as needed for wheezing or shortness of breath.  18 g  5  . amiodarone (PACERONE) 200 MG tablet Take 1 tablet (200 mg total) by mouth daily.  90 tablet  3  . apixaban (ELIQUIS) 5 MG TABS tablet Take 1 tablet (5 mg total) by mouth 2 (two) times daily.  60 tablet  3  . diltiazem (CARDIZEM CD) 240 MG 24 hr capsule TAKE ONE CAPSULE BY MOUTH EVERY DAY  30 capsule  3  . famotidine (PEPCID) 20 MG tablet One at bedtime  30 tablet  11  . montelukast (SINGULAIR) 10 MG tablet TAKE 1 TABLET BY MOUTH DAILY  90 tablet  1  . pantoprazole (PROTONIX) 40 MG tablet Take 1 tablet (40  mg total) by mouth daily. Take 30-60 min before first meal of the day  30 tablet  2  . predniSONE (DELTASONE) 10 MG tablet Take  4 each am x 2 days,   2 each am x 2 days,  1 each am x 2 days and stop  14 tablet  0  . SYMBICORT 160-4.5 MCG/ACT inhaler INHALE 2 PUFFS FIRST THING IN THE MORNING AND ANOTHER 2 PUFFS 12 HOURS LATER  1 Inhaler  5  . azithromycin (ZITHROMAX) 250 MG tablet Take 2 on day one then 1 daily x 4 days  6 tablet  0  . hydrocortisone cream 0.5 % Apply topically 2 (two) times daily.  30 g  0  . omeprazole (PRILOSEC) 20 MG capsule Take 20 mg by mouth daily.       No current facility-administered medications on file prior to visit.    Review of Systems:  As per HPI- otherwise negative.   Physical Examination: Filed Vitals:   07/15/13 1407  BP: 132/70  Pulse: 63  Temp: 97.8 F (36.6 C)   Filed Vitals:   07/15/13 1407  Height: 5\' 3"  (1.6 m)  Weight: 188 lb (85.276 kg)   Body mass index is 33.31 kg/(m^2). Ideal Body Weight: Weight in (lb) to have BMI = 25: 140.8  GEN: WDWN, NAD, Non-toxic, A & O x 3, obese, looks well.  Here with her daughter today HEENT: Atraumatic, Normocephalic. Neck supple. No masses, No LAD.  Bilateral TM wnl, oropharynx normal.  PEERL,EOMI.   Ears and Nose: No external deformity. CV: RRR, No M/G/R. No JVD. No thrill. No extra heart sounds.  Currently in sinus rhythm PULM: CTA B, no wheezes, crackles, rhonchi. No retractions. No resp. distress. No accessory muscle use. ABD: S, NT, ND EXTR: No c/c/e NEURO Normal gait.  PSYCH: Normally interactive. Conversant. Not depressed or anxious appearing.  Calm demeanor.   [facility] reading (PRIMARY) by  [name]. CXR:  Negative for effusion or infiltrate.  Appears stable or improved from most recent film.    CHEST 2 VIEW  COMPARISON: PA and lateral chest x-ray of July 10, 2013  FINDINGS: The lungs remain mildly hyperinflated. There is no focal infiltrate. The interstitial markings are coarse at  the lung bases but have improved overall. The heart and pulmonary vascularity are normal. The bony thorax is unremarkable.  IMPRESSION: COPD. There is no acute cardiopulmonary abnormality.  Assessment and Plan: Cough - Plan: DG Chest 2 View, ipratropium (ATROVENT) 0.03 % nasal spray, doxycycline (VIBRA-TABS) 100 MG tablet  Wheezing - Plan: DG Chest 2 View, doxycycline (VIBRA-TABS) 100 MG tablet  History of CHF (congestive heart failure) - Plan: Brain natriuretic peptide  SOB (shortness of breath) - Plan: CBC, Comprehensive metabolic panel, Brain natriuretic peptide, DG Chest 2 View  Await labs- do not highly suspect CHF exacerbation but will check BNP.  Her weight is up 4 lbs.   It seems that her current complaint is more of mucus in her throat than of cough.  Explained that I do not see any evidence of acute or dangerous pathology today.  She did smoke for 10- 20 years, so she may have some element of chronic bronchitis.   She is on symbicort already, just finished azithromycin, and is finishing out prednisone by mouth.   Will try to reduce her phlegm with atrovent nasal, and she can use plain mucinex OTC as needed.   Did give an rx for doxycycline for them to hold, but advised not to use it unless she is getting worse, has a fever or does not make some progress in the next few days.  They are to let me know if they end up filling this  Signed Lamar Blinks, MD  7/6: called and LMOM with her daughter [name]. Overall labs look good, her BNP is normal.  Creat is a little bit above normal but stable.  WBC count is slightly high.    Results for orders placed in visit on 07/15/13  CBC      Result Value Ref Range   WBC 11.2 (*) 4.0 - 10.5 K/uL   RBC 4.51  3.87 - 5.11 MIL/uL   Hemoglobin 12.4  12.0 - 15.0 g/dL   HCT 37.4  36.0 - 46.0 %   MCV 82.9  78.0 - 100.0 fL   MCH 27.5  26.0 - 34.0 pg   MCHC 33.2  30.0 - 36.0 g/dL   RDW 15.1  11.5 - 15.5 %   Platelets 279  150 - 400 K/uL   COMPREHENSIVE METABOLIC PANEL      Result Value Ref Range   Sodium 137  135 - 145 mEq/L   Potassium 4.2  3.5 - 5.3 mEq/L   Chloride 102  96 - 112 mEq/L   CO2 25  19 - 32 mEq/L   Glucose, Bld 80  70 - 99 mg/dL   BUN 23  6 - 23 mg/dL   Creat 1.23 (*) 0.50 - 1.10 mg/dL   Total Bilirubin 0.3  0.2 - 1.2 mg/dL   Alkaline Phosphatase 90  39 - 117 U/L   AST 17  0 - 37 U/L   ALT 17  0 - 35 U/L   Total Protein 6.1  6.0 - 8.3 g/dL   Albumin 3.8  3.5 - 5.2 g/dL   Calcium 9.1  8.4 - 10.5 mg/dL  BRAIN NATRIURETIC PEPTIDE      Result Value Ref Range   Brain Natriuretic Peptide 86.4  0.0 - 100.0 pg/mL   Labs look ok, no evidence of acute CHG.  Please let me know if [name] is not feeling better

## 2013-07-16 LAB — CBC
HEMATOCRIT: 37.4 % (ref 36.0–46.0)
HEMOGLOBIN: 12.4 g/dL (ref 12.0–15.0)
MCH: 27.5 pg (ref 26.0–34.0)
MCHC: 33.2 g/dL (ref 30.0–36.0)
MCV: 82.9 fL (ref 78.0–100.0)
Platelets: 279 10*3/uL (ref 150–400)
RBC: 4.51 MIL/uL (ref 3.87–5.11)
RDW: 15.1 % (ref 11.5–15.5)
WBC: 11.2 10*3/uL — ABNORMAL HIGH (ref 4.0–10.5)

## 2013-07-16 LAB — COMPREHENSIVE METABOLIC PANEL
ALT: 17 U/L (ref 0–35)
AST: 17 U/L (ref 0–37)
Albumin: 3.8 g/dL (ref 3.5–5.2)
Alkaline Phosphatase: 90 U/L (ref 39–117)
BILIRUBIN TOTAL: 0.3 mg/dL (ref 0.2–1.2)
BUN: 23 mg/dL (ref 6–23)
CO2: 25 meq/L (ref 19–32)
Calcium: 9.1 mg/dL (ref 8.4–10.5)
Chloride: 102 mEq/L (ref 96–112)
Creat: 1.23 mg/dL — ABNORMAL HIGH (ref 0.50–1.10)
GLUCOSE: 80 mg/dL (ref 70–99)
Potassium: 4.2 mEq/L (ref 3.5–5.3)
SODIUM: 137 meq/L (ref 135–145)
TOTAL PROTEIN: 6.1 g/dL (ref 6.0–8.3)

## 2013-07-17 LAB — BRAIN NATRIURETIC PEPTIDE: BRAIN NATRIURETIC PEPTIDE: 86.4 pg/mL (ref 0.0–100.0)

## 2013-08-05 ENCOUNTER — Ambulatory Visit (INDEPENDENT_AMBULATORY_CARE_PROVIDER_SITE_OTHER): Payer: Medicare Other | Admitting: Family Medicine

## 2013-08-05 VITALS — BP 164/64 | HR 69 | Temp 97.7°F | Resp 18 | Ht 62.0 in | Wt 185.2 lb

## 2013-08-05 DIAGNOSIS — R42 Dizziness and giddiness: Secondary | ICD-10-CM

## 2013-08-05 LAB — COMPREHENSIVE METABOLIC PANEL
ALT: 18 U/L (ref 0–35)
AST: 19 U/L (ref 0–37)
Albumin: 3.8 g/dL (ref 3.5–5.2)
Alkaline Phosphatase: 98 U/L (ref 39–117)
BUN: 18 mg/dL (ref 6–23)
CO2: 25 mEq/L (ref 19–32)
Calcium: 9.1 mg/dL (ref 8.4–10.5)
Chloride: 103 mEq/L (ref 96–112)
Creat: 1.1 mg/dL (ref 0.50–1.10)
Glucose, Bld: 98 mg/dL (ref 70–99)
Potassium: 4.2 mEq/L (ref 3.5–5.3)
Sodium: 138 mEq/L (ref 135–145)
Total Bilirubin: 0.5 mg/dL (ref 0.2–1.2)
Total Protein: 6.5 g/dL (ref 6.0–8.3)

## 2013-08-05 LAB — POCT CBC
Granulocyte percent: 71.1 %G (ref 37–80)
HCT, POC: 40.5 % (ref 37.7–47.9)
Hemoglobin: 13.2 g/dL (ref 12.2–16.2)
Lymph, poc: 2.3 (ref 0.6–3.4)
MCH, POC: 28.3 pg (ref 27–31.2)
MCHC: 32.7 g/dL (ref 31.8–35.4)
MCV: 86.5 fL (ref 80–97)
MID (cbc): 0.8 (ref 0–0.9)
MPV: 8.2 fL (ref 0–99.8)
POC Granulocyte: 7.7 — AB (ref 2–6.9)
POC LYMPH PERCENT: 21.4 %L (ref 10–50)
POC MID %: 7.5 %M (ref 0–12)
Platelet Count, POC: 219 10*3/uL (ref 142–424)
RBC: 4.68 M/uL (ref 4.04–5.48)
RDW, POC: 15.4 %
WBC: 10.9 10*3/uL — AB (ref 4.6–10.2)

## 2013-08-05 LAB — GLUCOSE, POCT (MANUAL RESULT ENTRY): POC Glucose: 93 mg/dl (ref 70–99)

## 2013-08-05 MED ORDER — MECLIZINE HCL 25 MG PO TABS
25.0000 mg | ORAL_TABLET | Freq: Three times a day (TID) | ORAL | Status: DC | PRN
Start: 2013-08-05 — End: 2013-08-13

## 2013-08-05 NOTE — Patient Instructions (Signed)
Benign Positional Vertigo Vertigo means you feel like you or your surroundings are moving when they are not. Benign positional vertigo is the most common form of vertigo. Benign means that the cause of your condition is not serious. Benign positional vertigo is more common in older adults. CAUSES  Benign positional vertigo is the result of an upset in the labyrinth system. This is an area in the middle ear that helps control your balance. This may be caused by a viral infection, head injury, or repetitive motion. However, often no specific cause is found. SYMPTOMS  Symptoms of benign positional vertigo occur when you move your head or eyes in different directions. Some of the symptoms may include:  Loss of balance and falls.  Vomiting.  Blurred vision.  Dizziness.  Nausea.  Involuntary eye movements (nystagmus). DIAGNOSIS  Benign positional vertigo is usually diagnosed by physical exam. If the specific cause of your benign positional vertigo is unknown, your caregiver may perform imaging tests, such as magnetic resonance imaging (MRI) or computed tomography (CT). TREATMENT  Your caregiver may recommend movements or procedures to correct the benign positional vertigo. Medicines such as meclizine, benzodiazepines, and medicines for nausea may be used to treat your symptoms. In rare cases, if your symptoms are caused by certain conditions that affect the inner ear, you may need surgery. HOME CARE INSTRUCTIONS   Follow your caregiver's instructions.  Move slowly. Do not make sudden body or head movements.  Avoid driving.  Avoid operating heavy machinery.  Avoid performing any tasks that would be dangerous to you or others during a vertigo episode.  Drink enough fluids to keep your urine clear or pale yellow. SEEK IMMEDIATE MEDICAL CARE IF:   You develop problems with walking, weakness, numbness, or using your arms, hands, or legs.  You have difficulty speaking.  You develop  severe headaches.  Your nausea or vomiting continues or gets worse.  You develop visual changes.  Your family or friends notice any behavioral changes.  Your condition gets worse.  You have a fever.  You develop a stiff neck or sensitivity to light. MAKE SURE YOU:   Understand these instructions.  Will watch your condition.  Will get help right away if you are not doing well or get worse. Document Released: 10/06/2005 Document Revised: 03/23/2011 Document Reviewed: 09/18/2010 Exit[employer] Patient Information 2015 [employer]. This information is not intended to replace advice given to you by your health care provider. Make sure you discuss any questions you have with your health care provider.    

## 2013-08-05 NOTE — Progress Notes (Signed)
   Subjective:    Patient ID: Crystal Kerr, female    DOB: 10/05/1924, 80 y.o.   MRN: [id number] This chart was scribed for Robyn Haber, MD by Girtha Hake, ED Scribe. The patient was seen in Room 2. The patient's care was started at 8:36 AM.     HPI HPI Comments: Crystal Kerr is a 80 y.o. female who presents to the Emergency Department complaining of lightheadedness and a sense of being off-balance beginning last night Patient reports that these symptoms are associated with movement of her head, and symptoms continue. She denies any similar episodes in the past. Patient denies nausea, weakness in arms or legs, double vision, or swelling.  She has not fallen or hit her head.  She had a similar problem in the past associated with an ear infection which resolved.  Patient reports that she had a cardioversion in May 2015. PCP is Dr. Edilia Bo.  Review of Systems  Eyes: Negative for visual disturbance.  Gastrointestinal: Negative for nausea.  Neurological: Positive for light-headedness. Negative for weakness.       Objective:   Physical Exam  Nursing note and vitals reviewed. Constitutional: She is oriented to person, place, and time. She appears well-developed and well-nourished. No distress.  HENT:  Head: Normocephalic and atraumatic.  Right Ear: External ear normal.  Left Ear: External ear normal.  No bruit.  Eyes: Conjunctivae and EOM are normal.  Neck: Neck supple. No tracheal deviation present.  Cardiovascular: Normal rate and regular rhythm.   Murmur (Soft, 1/6 systolic murmur) heard. Pulmonary/Chest: Effort normal. No respiratory distress.  Musculoskeletal: Normal range of motion.  Neurological: She is alert and oriented to person, place, and time.  Skin: Skin is warm and dry.  Psychiatric: She has a normal mood and affect. Her behavior is normal.   Results for orders placed in visit on 08/05/13  POCT CBC      Result Value Ref Range   WBC  10.9 (*) 4.6 - 10.2 K/uL   Lymph, poc 2.3  0.6 - 3.4   POC LYMPH PERCENT 21.4  10 - 50 %L   MID (cbc) 0.8  0 - 0.9   POC MID % 7.5  0 - 12 %M   POC Granulocyte 7.7 (*) 2 - 6.9   Granulocyte percent 71.1  37 - 80 %G   RBC 4.68  4.04 - 5.48 M/uL   Hemoglobin 13.2  12.2 - 16.2 g/dL   HCT, POC 40.5  37.7 - 47.9 %   MCV 86.5  80 - 97 fL   MCH, POC 28.3  27 - 31.2 pg   MCHC 32.7  31.8 - 35.4 g/dL   RDW, POC 15.4     Platelet Count, POC 219  142 - 424 K/uL   MPV 8.2  0 - 99.8 fL  GLUCOSE, POCT (MANUAL RESULT ENTRY)      Result Value Ref Range   POC Glucose 93  70 - 99 mg/dl        Assessment & Plan:   This chart was scribed in my presence and reviewed by me personally. Patient's problem is most typical for benign positional vertigo. I'm expecting this to get better in 48 hours Lightheadedness - Plan: POCT CBC, Comprehensive metabolic panel, POCT glucose (manual entry), meclizine (ANTIVERT) 25 MG tablet  Signed, Robyn Haber, MD

## 2013-08-07 ENCOUNTER — Other Ambulatory Visit: Payer: Self-pay | Admitting: Cardiovascular Disease

## 2013-08-07 ENCOUNTER — Encounter: Payer: Self-pay | Admitting: Internal Medicine

## 2013-08-07 ENCOUNTER — Ambulatory Visit (INDEPENDENT_AMBULATORY_CARE_PROVIDER_SITE_OTHER): Payer: Medicare Other | Admitting: Internal Medicine

## 2013-08-07 VITALS — BP 128/64 | HR 63 | Temp 98.3°F | Ht 63.25 in | Wt 188.2 lb

## 2013-08-07 DIAGNOSIS — J45909 Unspecified asthma, uncomplicated: Secondary | ICD-10-CM

## 2013-08-07 DIAGNOSIS — R059 Cough, unspecified: Secondary | ICD-10-CM

## 2013-08-07 DIAGNOSIS — R058 Other specified cough: Secondary | ICD-10-CM

## 2013-08-07 DIAGNOSIS — J4520 Mild intermittent asthma, uncomplicated: Secondary | ICD-10-CM

## 2013-08-07 DIAGNOSIS — R05 Cough: Secondary | ICD-10-CM | POA: Diagnosis not present

## 2013-08-07 NOTE — Patient Instructions (Addendum)
symbicort 160 one twice daily and if doing great in couple weeks ok to try off and see if you notice a difference and if  worse restart at the dose that previously worked.  Work on inhaler technique:  relax and gently blow all the way out then take a nice smooth deep breath back in, triggering the inhaler at same time you start breathing in.  Hold for up to 5 seconds if you can.  Rinse and gargle with water when done  Please schedule a follow up visit in 3 months but call sooner if needed

## 2013-08-07 NOTE — Assessment & Plan Note (Signed)
Her hfa is so poor it's hard to be sure she even has chronic asthma  The proper method of use, as well as anticipated side effects, of a metered-dose inhaler are discussed and demonstrated to the patient. Improved effectiveness after extensive coaching during this visit to a level of approximately  75% from a baseline of < 25 % so ok to reduce to one bid dosing and then try off to see if it flares    Each maintenance medication was reviewed in detail including most importantly the difference between maintenance and as needed and under what circumstances the prns are to be used.  Please see instructions for details which were reviewed in writing and the patient given a copy.

## 2013-08-07 NOTE — Progress Notes (Signed)
Subjective:    Patient ID: Crystal Kerr, female    DOB: 07-Sep-1924 MRN: [id number]  Brief patient profile:  58 yowf quit smoking 1983 prev eval Dr Joya Gaskins dx with asthma  Referred back to pulmonary clinic 02/17/13 by Dr Rod Can with cough/sob since [place] dec 2014    History of Present Illness  02/17/2013 1st [facility] office visit/ Crystal Kerr cc new sob/cough since [facility] Dec 2014 with white mucus worse day than night while on advair and better p albuterol for up to 6 h day = night s purulent sputum rec Symbicort 160 Take 2 puffs first thing in am and then another 2 puffs about 12 hours later.  Only use your albuterol (proair) as a rescue medication  Prednisone 10 mg take  4 each am x 2 days,   2 each am x 2 days,  1 each am x 2 days and stop  Try prilosec 20mg   Take 30-60 min before first meal of the day and Pepcid 20 mg one bedtime until cough is completely gone for at least a week without the need for cough suppression GERD diet       03/24/2013 Follow up  Returns for 2 week follow up Asthma .  Reports breathing 50-60% improved since last ov. Still having DOE but better.  Still weak. Was recently admitted for Gastroenteritis .  Had asthma flare during hospital stay, tx w/ steroid taper.   Also prilosec is helping her reflux .   07/05/2013 Acute OV   MW pt here for throat congestion with clear mucus production x1 month.   Denies any dyspnea, wheezing, tightness, cough, head congestion, PND, leg swelling, f/c/s/n/v. Using claritin without much help. Taking singulair.  No SABA use.  Feels breathing is ok , w/ no wheezing or dyspnea.  rec Stop Claritin. Begin Allegra 180 mg daily as needed. For drainage, drippy nose Use Chlortrimeton 4mg  At bedtime  X 3 days for drippy nose.  Saline nasal rinses As needed   Dymista Nasal  2 puffs daily each nostril, until sample is gone   07/10/2013 f/u ov/Crystal Kerr re: worse cough x > one month but actually "coughing daily x years"/ not on  pepcid at hs Chief Complaint  Patient presents with  . Acute Visit    Pt believes she has bronchitis.  c/o fatigue, dizziness, wheezing, prod cough with yellow mucous X3 days.   cough worse at hs ? Better p saba despite reporting still taking symbicort 160 2bid  No sob unless coughing rec Prednisone 10 mg take  4 each am x 2 days,  2 each am x 2 days,  1 each am x 2 days and stop  Pantoprazole (protonix) 40 mg  Take 30-60 min before first meal of the day and Pepcid 20 mg one at  bedtime and chlortrimeton 4 mg x 2 at bedtime For drainage take chlortrimeton (chlorpheniramine) 4 mg every 4 hours available over the counter (may cause drowsiness)   Best cough medication is delsym 2 tsp every 12 hours as needed GERD diet   08/07/2013 f/u ov/Crystal Kerr re: chronic cough on symbicort 160 2bid but very poor fha plus gerd rx  Chief Complaint  Patient presents with  . Follow-up    Pt states that her cough has improved since the last visit. She states "I very seldom have a cough". No new co's today.  She states that she rarely uses rescue inhaler.   noct cough much better, no am excess mucus.  No obvious day to day or daytime variabilty or assoc   cp or chest tightness, subjective wheeze overt sinus or hb symptoms. No unusual exp hx or h/o childhood pna/ asthma or knowledge of premature birth.  Sleeping ok without nocturnal  or early am exacerbation  of respiratory  c/o's or need for noct saba. Also denies any obvious fluctuation of symptoms with weather or environmental changes or other aggravating or alleviating factors except as outlined above   Current Medications, Allergies, Complete Past Medical History, Past Surgical History, Family History, and Social History were reviewed in [employer] record.  ROS  The following are not active complaints unless bolded sore throat, dysphagia, dental problems, itching, sneezing,  nasal congestion or excess/ purulent secretions, ear ache,    fever, chills, sweats, unintended wt loss, pleuritic or exertional cp, hemoptysis,  orthopnea pnd or leg swelling, presyncope, palpitations, heartburn, abdominal pain, anorexia, nausea, vomiting, diarrhea  or change in bowel or urinary habits, change in stools or urine, dysuria,hematuria,  rash, arthralgias, visual complaints, headache, numbness weakness or ataxia or problems with walking or coordination,  change in mood/affect or memory.                        Objective:  Physical Exam   amb wf nad/ no longer hoarse or pseudowheeze   08/07/2013          188  Wt Readings from Last 3 Encounters:  07/10/13 185 lb (83.915 kg)  07/05/13 184 lb 6.4 oz (83.643 kg)  04/14/13 177 lb (80.287 kg)      HEENT: nl dentition, turbinates, and orophanx. Nl external ear canals without cough reflex   NECK :  without JVD/Nodes/TM/ nl carotid upstrokes bilaterally   LUNGS: no acc muscle use, clear to A and P bilaterally without cough on insp or exp maneuvers   CV:  RRR  no s3 or murmur or increase in P2, no edema   ABD:  soft and nontender with nl excursion in the supine position. No bruits or organomegaly, bowel sounds nl  MS:  warm without deformities, calf tenderness, cyanosis or clubbing  SKIN: warm and dry without lesions       CXR  07/10/2013 :  There is no edema or consolidation. The heart size and pulmonary vascularity are normal. No adenopathy. There is atherosclerotic change in the aorta. There are no appreciable bone lesions. There is mid thoracic dextroscoliosis and lower thoracic levoscoliosis.       Assessment & Plan:

## 2013-08-07 NOTE — Assessment & Plan Note (Signed)
Still favor at least a component of uacs with marked improvement noct symptoms on 1st gen h1 which I've asked her to continue

## 2013-08-10 ENCOUNTER — Other Ambulatory Visit: Payer: Self-pay | Admitting: Internal Medicine

## 2013-08-13 ENCOUNTER — Other Ambulatory Visit: Payer: Self-pay | Admitting: Family Medicine

## 2013-08-13 ENCOUNTER — Ambulatory Visit (INDEPENDENT_AMBULATORY_CARE_PROVIDER_SITE_OTHER): Payer: Medicare Other | Admitting: Family Medicine

## 2013-08-13 VITALS — BP 133/80 | HR 64 | Temp 97.5°F | Resp 20 | Ht 62.5 in | Wt 186.0 lb

## 2013-08-13 DIAGNOSIS — R319 Hematuria, unspecified: Secondary | ICD-10-CM

## 2013-08-13 DIAGNOSIS — R42 Dizziness and giddiness: Secondary | ICD-10-CM | POA: Diagnosis not present

## 2013-08-13 DIAGNOSIS — H8110 Benign paroxysmal vertigo, unspecified ear: Secondary | ICD-10-CM | POA: Diagnosis not present

## 2013-08-13 LAB — POCT URINALYSIS DIPSTICK
Bilirubin, UA: NEGATIVE
Glucose, UA: NEGATIVE
Ketones, UA: NEGATIVE
Nitrite, UA: NEGATIVE
Protein, UA: NEGATIVE
SPEC GRAV UA: 1.01
Urobilinogen, UA: 0.2
pH, UA: 5

## 2013-08-13 LAB — POCT UA - MICROSCOPIC ONLY
Casts, Ur, LPF, POC: NEGATIVE
Crystals, Ur, HPF, POC: NEGATIVE
Mucus, UA: NEGATIVE
Yeast, UA: NEGATIVE

## 2013-08-13 MED ORDER — MECLIZINE HCL 25 MG PO TABS: 25.0000 mg | ORAL_TABLET | Freq: Three times a day (TID) | ORAL | Status: DC | PRN

## 2013-08-13 NOTE — Progress Notes (Signed)
Subjective:    Patient ID: Crystal Kerr, female    DOB: 31-Oct-1924, 80 y.o.   MRN: [id number] This chart was scribed for Crystal Knapp, MD by Cathie Hoops, ED Scribe. The patient was seen in Room 4. The patient's care was started at 11:01 AM.   Chief Complaint  Patient presents with  . Gait Problem    unable to keep her balance.  was seen last saturday for the same.  this did get better from last visit but not 100%.   Past Medical History  Diagnosis Date  . Asthma   . PVC's (premature ventricular contractions)   . Arthritis   . CHF (congestive heart failure)   . Atrial fibrillation    Current Outpatient Prescriptions on File Prior to Visit  Medication Sig Dispense Refill  . albuterol (PROAIR HFA) 108 (90 BASE) MCG/ACT inhaler Inhale 2 puffs into the lungs every 4 (four) hours as needed for wheezing or shortness of breath.  18 g  5  . amiodarone (PACERONE) 200 MG tablet Take 1 tablet (200 mg total) by mouth daily.  90 tablet  3  . diltiazem (CARDIZEM CD) 240 MG 24 hr capsule TAKE ONE CAPSULE BY MOUTH EVERY DAY  30 capsule  3  . ELIQUIS 5 MG TABS tablet TAKE 1 TABLET (5 MG TOTAL) BY MOUTH 2 (TWO) TIMES DAILY.  60 tablet  3  . famotidine (PEPCID) 20 MG tablet One at bedtime  30 tablet  11  . guaiFENesin (MUCINEX) 600 MG 12 hr tablet Take 600 mg by mouth 2 (two) times daily as needed.      . montelukast (SINGULAIR) 10 MG tablet TAKE 1 TABLET BY MOUTH DAILY  90 tablet  1  . pantoprazole (PROTONIX) 40 MG tablet Take 1 tablet (40 mg total) by mouth daily. Take 30-60 min before first meal of the day  30 tablet  2  . SYMBICORT 160-4.5 MCG/ACT inhaler INHALE 2 PUFFS FIRST THING IN THE MORNING AND ANOTHER 2 PUFFS 12 HOURS LATER  1 Inhaler  5  . loratadine (CLARITIN) 10 MG tablet Take 10 mg by mouth daily.       No current facility-administered medications on file prior to visit.   No Known Allergies  HPI HPI Comments: Crystal Kerr is a 80 y.o. female who presents to the  [facility] complaining of new, moderate, episodic dizziness. Pt reports taking Antivert and her symptoms improved but her symptoms recurred last night. Pt reports she feels unsteady and feels like she is falling.Pt states it is worsened at night when she turns over in bed and when she stands up. Pt reports she has been compliant with Antivert. Pt has rhinorrhea, post-nasal drip and sinus congestion onset 1 month ago. Pt reports she had vertigo after an inner ear infection several years ago. Pt denies LOC, falls, hearing loss, illness, chest pain, heart racing hearing loss, sinus pressure or skipped beats or symptoms at rest.   Pt was seen on 7/25 by Dr. Joseph Art had mild leukocytosis thought DPPB and started on meclizine. She had a normal MCV and MCH with glucose at last visit. TSH was checked in April, otherwise normal.  Review of Systems  Constitutional: Positive for fatigue. Negative for fever.  HENT: Positive for congestion, postnasal drip and rhinorrhea. Negative for drooling.   Respiratory: Negative for cough and shortness of breath.   Cardiovascular: Negative for chest pain.  Gastrointestinal: Negative for nausea, vomiting and diarrhea.  Skin:  Negative for color change.  Neurological: Positive for dizziness and light-headedness. Negative for headaches.  Psychiatric/Behavioral: Negative for behavioral problems.  All other systems reviewed and are negative.  Objective:   Physical Exam  Nursing note and vitals reviewed. Constitutional: She is oriented to person, place, and time. She appears well-developed.  HENT:  Head: Normocephalic and atraumatic.  Right Ear: Tympanic membrane normal.  Left Ear: Tympanic membrane normal.  Nose: Nose normal.  Mouth/Throat: Oropharynx is clear and moist.  Eyes: Conjunctivae and EOM are normal. No scleral icterus.  Neck: Neck supple. No thyromegaly present.  Cardiovascular: Normal rate, regular rhythm, S1 normal, S2 normal and  normal heart sounds.  Exam reveals no gallop and no friction rub.   No murmur heard. Pulmonary/Chest: She has wheezes (expiratory). She has rales (Inspiratory). She exhibits no tenderness.  Abdominal: Soft. She exhibits no distension. There is no tenderness. There is no rebound.  Musculoskeletal: Normal range of motion. She exhibits no edema.  Lymphadenopathy:    She has no cervical adenopathy.  Neurological: She is alert and oriented to person, place, and time.  Skin: No rash noted. No erythema.  Psychiatric: She has a normal mood and affect. Her behavior is normal.   Triage Vitals: BP 130/68  Pulse 64  Temp(Src) 97.5 F (36.4 C) (Oral)  Resp 20  Ht 5' 2.5" (1.588 m)  Wt 186 lb (84.369 kg)  BMI 33.46 kg/m2  SpO2 94%  Results for orders placed in visit on 08/13/13  POCT UA - MICROSCOPIC ONLY      Result Value Ref Range   WBC, Ur, HPF, POC 3-8     RBC, urine, microscopic 1-4     Bacteria, U Microscopic trace     Mucus, UA neg     Epithelial cells, urine per micros 1-6     Crystals, Ur, HPF, POC neg     Casts, Ur, LPF, POC neg     Yeast, UA neg    POCT URINALYSIS DIPSTICK      Result Value Ref Range   Color, UA lt yellow     Clarity, UA clear     Glucose, UA neg     Bilirubin, UA neg     Ketones, UA neg     Spec Grav, UA 1.010     Blood, UA trace-lysed     pH, UA 5.0     Protein, UA neg     Urobilinogen, UA 0.2     Nitrite, UA neg     Leukocytes, UA moderate (2+)      Assessment & Plan:  11:12 AM- Patient informed of current plan for treatment and evaluation and agrees with plan at this time.  Benign paroxysmal positional vertigo, unspecified laterality - Plan: Ambulatory referral to ENT  Lightheadedness - Plan: meclizine (ANTIVERT) 25 MG tablet, Ambulatory referral to ENT, POCT UA - Microscopic Only, POCT urinalysis dipstick, Urine culture  Hematuria  - Plan: Urine culture  Meds ordered this encounter  Medications  . meclizine (ANTIVERT) 25 MG tablet    Sig:  Take 1 tablet (25 mg total) by mouth 3 (three) times daily as needed for dizziness.    Dispense:  30 tablet    Refill:  0    I personally performed the services described in this documentation, which was scribed in my presence. The recorded information has been reviewed and considered, and addended by me as needed.  [name], MD MPH

## 2013-08-14 LAB — URINE CULTURE: Colony Count: >=100000

## 2013-08-16 ENCOUNTER — Other Ambulatory Visit: Payer: Self-pay

## 2013-08-16 ENCOUNTER — Ambulatory Visit: Payer: Medicare Other | Admitting: Internal Medicine

## 2013-08-16 MED ORDER — DILTIAZEM HCL ER COATED BEADS 240 MG PO CP24: ORAL_CAPSULE | ORAL | Status: DC

## 2013-08-18 DIAGNOSIS — R42 Dizziness and giddiness: Secondary | ICD-10-CM | POA: Diagnosis not present

## 2013-08-18 DIAGNOSIS — H905 Unspecified sensorineural hearing loss: Secondary | ICD-10-CM | POA: Diagnosis not present

## 2013-09-13 ENCOUNTER — Encounter: Payer: Self-pay | Admitting: Family Medicine

## 2013-09-13 DIAGNOSIS — H8110 Benign paroxysmal vertigo, unspecified ear: Secondary | ICD-10-CM

## 2013-09-14 ENCOUNTER — Other Ambulatory Visit: Payer: Self-pay | Admitting: Internal Medicine

## 2013-09-14 DIAGNOSIS — J45909 Unspecified asthma, uncomplicated: Secondary | ICD-10-CM

## 2013-09-14 MED ORDER — FAMOTIDINE 20 MG PO TABS: ORAL_TABLET | ORAL | Status: DC

## 2013-09-14 MED ORDER — PANTOPRAZOLE SODIUM 40 MG PO TBEC: 40.0000 mg | DELAYED_RELEASE_TABLET | Freq: Every day | ORAL | Status: DC

## 2013-09-15 ENCOUNTER — Other Ambulatory Visit: Payer: Self-pay

## 2013-09-15 MED ORDER — IPRATROPIUM BROMIDE 0.03 % NA SOLN: 2.0000 | Freq: Two times a day (BID) | NASAL | Status: DC

## 2013-10-12 ENCOUNTER — Ambulatory Visit (INDEPENDENT_AMBULATORY_CARE_PROVIDER_SITE_OTHER): Payer: Medicare Other | Admitting: Family Medicine

## 2013-10-12 VITALS — BP 126/72 | HR 70 | Temp 98.0°F | Resp 16 | Ht 62.0 in | Wt 188.0 lb

## 2013-10-12 DIAGNOSIS — M47819 Spondylosis without myelopathy or radiculopathy, site unspecified: Secondary | ICD-10-CM | POA: Diagnosis not present

## 2013-10-12 DIAGNOSIS — J209 Acute bronchitis, unspecified: Secondary | ICD-10-CM | POA: Diagnosis not present

## 2013-10-12 DIAGNOSIS — R0602 Shortness of breath: Secondary | ICD-10-CM

## 2013-10-12 DIAGNOSIS — J4521 Mild intermittent asthma with (acute) exacerbation: Secondary | ICD-10-CM

## 2013-10-12 MED ORDER — GUAIFENESIN ER 600 MG PO TB12: 600.0000 mg | ORAL_TABLET | Freq: Two times a day (BID) | ORAL | Status: DC | PRN

## 2013-10-12 MED ORDER — TRAMADOL HCL 50 MG PO TABS: 50.0000 mg | ORAL_TABLET | Freq: Three times a day (TID) | ORAL | Status: DC | PRN

## 2013-10-12 MED ORDER — MONTELUKAST SODIUM 10 MG PO TABS: ORAL_TABLET | ORAL | Status: DC

## 2013-10-12 MED ORDER — PREDNISONE 10 MG PO TABS: ORAL_TABLET | ORAL | Status: DC

## 2013-10-12 MED ORDER — AZITHROMYCIN 250 MG PO TABS: ORAL_TABLET | ORAL | Status: DC

## 2013-10-12 MED ORDER — ALBUTEROL SULFATE HFA 108 (90 BASE) MCG/ACT IN AERS
2.0000 | INHALATION_SPRAY | RESPIRATORY_TRACT | Status: DC | PRN
Start: 2013-10-12 — End: 2014-07-26

## 2013-10-12 MED ORDER — ALBUTEROL SULFATE (2.5 MG/3ML) 0.083% IN NEBU
2.5000 mg | INHALATION_SOLUTION | Freq: Once | RESPIRATORY_TRACT | Status: AC
  Administered 2013-10-12: 2.5 mg via RESPIRATORY_TRACT

## 2013-10-12 NOTE — Patient Instructions (Signed)
Acute Bronchitis Bronchitis is inflammation of the airways that extend from the windpipe into the lungs (bronchi). The inflammation often causes mucus to develop. This leads to a cough, which is the most common symptom of bronchitis.  In acute bronchitis, the condition usually develops suddenly and goes away over time, usually in a couple weeks. Smoking, allergies, and asthma can make bronchitis worse. Repeated episodes of bronchitis may cause further lung problems.  CAUSES Acute bronchitis is most often caused by the same virus that causes a cold. The virus can spread from person to person (contagious) through coughing, sneezing, and touching contaminated objects. SIGNS AND SYMPTOMS   Cough.   Fever.   Coughing up mucus.   Body aches.   Chest congestion.   Chills.   Shortness of breath.   Sore throat.  DIAGNOSIS  Acute bronchitis is usually diagnosed through a physical exam. Your health care provider will also ask you questions about your medical history. Tests, such as chest X-rays, are sometimes done to rule out other conditions.  TREATMENT  Acute bronchitis usually goes away in a couple weeks. Oftentimes, no medical treatment is necessary. Medicines are sometimes given for relief of fever or cough. Antibiotic medicines are usually not needed but may be prescribed in certain situations. In some cases, an inhaler may be recommended to help reduce shortness of breath and control the cough. A cool mist vaporizer may also be used to help thin bronchial secretions and make it easier to clear the chest.  HOME CARE INSTRUCTIONS  Get plenty of rest.   Drink enough fluids to keep your urine clear or pale yellow (unless you have a medical condition that requires fluid restriction). Increasing fluids may help thin your respiratory secretions (sputum) and reduce chest congestion, and it will prevent dehydration.   Take medicines only as directed by your health care provider.  If  you were prescribed an antibiotic medicine, finish it all even if you start to feel better.  Avoid smoking and secondhand smoke. Exposure to cigarette smoke or irritating chemicals will make bronchitis worse. If you are a smoker, consider using nicotine gum or skin patches to help control withdrawal symptoms. Quitting smoking will help your lungs heal faster.   Reduce the chances of another bout of acute bronchitis by washing your hands frequently, avoiding people with cold symptoms, and trying not to touch your hands to your mouth, nose, or eyes.   Keep all follow-up visits as directed by your health care provider.  SEEK MEDICAL CARE IF: Your symptoms do not improve after 1 week of treatment.  SEEK IMMEDIATE MEDICAL CARE IF:  You develop an increased fever or chills.   You have chest pain.   You have severe shortness of breath.  You have bloody sputum.   You develop dehydration.  You faint or repeatedly feel like you are going to pass out.  You develop repeated vomiting.  You develop a severe headache. MAKE SURE YOU:   Understand these instructions.  Will watch your condition.  Will get help right away if you are not doing well or get worse. Document Released: 02/06/2004 Document Revised: 05/15/2013 Document Reviewed: 06/21/2012 [facility] Patient Information 2015 [place], [place]. This information is not intended to replace advice given to you by your health care provider. Make sure you discuss any questions you have with your health care provider. Asthma, Acute Bronchospasm Acute bronchospasm caused by asthma is also referred to as an asthma attack. Bronchospasm means your air passages become narrowed. The  narrowing is caused by inflammation and tightening of the muscles in the air tubes (bronchi) in your lungs. This can make it hard to breathe or cause you to wheeze and cough. CAUSES Possible triggers are:  Animal dander from the skin, hair, or feathers of  animals.  Dust mites contained in house dust.  Cockroaches.  Pollen from trees or grass.  Mold.  Cigarette or tobacco smoke.  Air pollutants such as dust, household cleaners, hair sprays, aerosol sprays, paint fumes, strong chemicals, or strong odors.  Cold air or weather changes. Cold air may trigger inflammation. Winds increase molds and pollens in the air.  Strong emotions such as crying or laughing hard.  Stress.  Certain medicines such as aspirin or beta-blockers.  Sulfites in foods and drinks, such as dried fruits and wine.  Infections or inflammatory conditions, such as a flu, cold, or inflammation of the nasal membranes (rhinitis).  Gastroesophageal reflux disease (GERD). GERD is a condition where stomach acid backs up into your esophagus.  Exercise or strenuous activity. SIGNS AND SYMPTOMS   Wheezing.  Excessive coughing, particularly at night.  Chest tightness.  Shortness of breath. DIAGNOSIS  Your health care provider will ask you about your medical history and perform a physical exam. A chest X-ray or blood testing may be performed to look for other causes of your symptoms or other conditions that may have triggered your asthma attack. TREATMENT  Treatment is aimed at reducing inflammation and opening up the airways in your lungs. Most asthma attacks are treated with inhaled medicines. These include quick relief or rescue medicines (such as bronchodilators) and controller medicines (such as inhaled corticosteroids). These medicines are sometimes given through an inhaler or a nebulizer. Systemic steroid medicine taken by mouth or given through an IV tube also can be used to reduce the inflammation when an attack is moderate or severe. Antibiotic medicines are only used if a bacterial infection is present.  HOME CARE INSTRUCTIONS   Rest.  Drink plenty of liquids. This helps the mucus to remain thin and be easily coughed up. Only use caffeine in moderation and  do not use alcohol until you have recovered from your illness.  Do not smoke. Avoid being exposed to secondhand smoke.  You play a critical role in keeping yourself in good health. Avoid exposure to things that cause you to wheeze or to have breathing problems.  Keep your medicines up-to-date and available. Carefully follow your health care provider's treatment plan.  Take your medicine exactly as prescribed.  When pollen or pollution is bad, keep windows closed and use an air conditioner or go to places with air conditioning.  Asthma requires careful medical care. See your health care provider for a follow-up as advised. If you are more than [redacted] weeks pregnant and you were prescribed any new medicines, let your obstetrician know about the visit and how you are doing. Follow up with your health care provider as directed.  After you have recovered from your asthma attack, make an appointment with your outpatient doctor to talk about ways to reduce the likelihood of future attacks. If you do not have a doctor who manages your asthma, make an appointment with a primary care doctor to discuss your asthma. SEEK IMMEDIATE MEDICAL CARE IF:   You are getting worse.  You have trouble breathing. If severe, call your local emergency services (911 in the [place]  You develop chest pain or discomfort.  You are vomiting.  You are not able to  keep fluids down.  You are coughing up yellow, green, brown, or bloody sputum.  You have a fever and your symptoms suddenly get worse.  You have trouble swallowing. MAKE SURE YOU:   Understand these instructions.  Will watch your condition.  Will get help right away if you are not doing well or get worse. Document Released: 04/15/2006 Document Revised: 01/03/2013 Document Reviewed: 07/06/2012 [facility] Patient Information 2015 [place], [place]. This information is not intended to replace advice given to you by your health care provider. Make sure you  discuss any questions you have with your health care provider.

## 2013-10-12 NOTE — Progress Notes (Signed)
Subjective:    Patient ID: Crystal Kerr, female    DOB: Apr 18, 1924, 80 y.o.   MRN: [id number] Chief Complaint  Patient presents with  . Arthritis    flair up  . Bronchitis    started last night   . Wheezing  . chest congestion    HPI  Started to become ill yesterday with increased cough and wheezing productive of thick clear sputum.  Increased shortness of breath. She had only been using her albuterol about twice a dweek but restarted her symbicort 2 puffs twice a day back yesterday.  Has had a little worse coughing when she lays down (but no true orthopeniua) and has had a little more edema.  Has some atrovent nasal spray which she has not restarted. She is not usiong any otc meds other than prn tylenol. She is taking her singulair in the evening.  No f/c, +rhinits, +pnd, no pharyngityis.  Saw Dr. Melvyn Novas sev mos ago who said she was doing well and ok to back off on her inhalers.  Her cardiologist told her not to take fish oil  She also has been having more pain in low back and right shoulder worse for the past several wks due to the weather changes. Has a TENS unit but has to have someone else put it on eher due to her Rt arm.  Would like some tramadol and wonders if it would be save to restart glucosamine-chondroiton  Past Medical History  Diagnosis Date  . Asthma   . PVC's (premature ventricular contractions)   . Arthritis   . CHF (congestive heart failure)   . Atrial fibrillation    Current Outpatient Prescriptions on File Prior to Visit  Medication Sig Dispense Refill  . albuterol (PROAIR HFA) 108 (90 BASE) MCG/ACT inhaler Inhale 2 puffs into the lungs every 4 (four) hours as needed for wheezing or shortness of breath.  18 g  5  . amiodarone (PACERONE) 200 MG tablet Take 1 tablet (200 mg total) by mouth daily.  90 tablet  3  . diltiazem (CARDIZEM CD) 240 MG 24 hr capsule TAKE ONE CAPSULE BY MOUTH EVERY DAY  30 capsule  3  . ELIQUIS 5 MG TABS tablet TAKE 1 TABLET (5  MG TOTAL) BY MOUTH 2 (TWO) TIMES DAILY.  60 tablet  3  . famotidine (PEPCID) 20 MG tablet One at bedtime  30 tablet  11  . ipratropium (ATROVENT) 0.03 % nasal spray Place 2 sprays into the nose 2 (two) times daily.  90 mL  1  . montelukast (SINGULAIR) 10 MG tablet TAKE 1 TABLET BY MOUTH DAILY  90 tablet  1  . pantoprazole (PROTONIX) 40 MG tablet Take 1 tablet (40 mg total) by mouth daily. Take 30-60 min before first meal of the day  30 tablet  11  . SYMBICORT 160-4.5 MCG/ACT inhaler INHALE 2 PUFFS FIRST THING IN THE MORNING AND ANOTHER 2 PUFFS 12 HOURS LATER  1 Inhaler  5  . guaiFENesin (MUCINEX) 600 MG 12 hr tablet Take 600 mg by mouth 2 (two) times daily as needed.      . loratadine (CLARITIN) 10 MG tablet Take 10 mg by mouth daily.      . meclizine (ANTIVERT) 25 MG tablet Take 1 tablet (25 mg total) by mouth 3 (three) times daily as needed for dizziness.  30 tablet  0   No current facility-administered medications on file prior to visit.   No Known Allergies   Review of  Systems  Constitutional: Positive for activity change and fatigue. Negative for fever, chills and diaphoresis.  HENT: Positive for congestion, postnasal drip and rhinorrhea. Negative for sneezing and sore throat.   Respiratory: Positive for cough, chest tightness, shortness of breath and wheezing. Negative for stridor.   Cardiovascular: Positive for leg swelling. Negative for chest pain.  Gastrointestinal: Negative for nausea and vomiting.  Musculoskeletal: Positive for arthralgias, back pain and joint swelling.  Skin: Negative for rash.  Hematological: Negative for adenopathy.  Psychiatric/Behavioral: Positive for sleep disturbance.       Objective:  BP 126/72  Pulse 70  Temp(Src) 98 F (36.7 C) (Oral)  Resp 16  Ht 5\' 2"  (1.575 m)  Wt 188 lb (85.276 kg)  BMI 34.38 kg/m2  SpO2 94%  Physical Exam  Constitutional: She is oriented to person, place, and time. She appears well-developed and well-nourished. She  appears lethargic. She appears ill. No distress.  HENT:  Head: Normocephalic and atraumatic.  Right Ear: Tympanic membrane, external ear and ear canal normal.  Left Ear: Tympanic membrane, external ear and ear canal normal.  Nose: Rhinorrhea present. No mucosal edema. Right sinus exhibits no maxillary sinus tenderness. Left sinus exhibits no maxillary sinus tenderness.  Mouth/Throat: Uvula is midline and mucous membranes are normal. Posterior oropharyngeal erythema present. No oropharyngeal exudate or posterior oropharyngeal edema.  Eyes: Conjunctivae are normal. Right eye exhibits no discharge. Left eye exhibits no discharge. No scleral icterus.  Neck: Normal range of motion. Neck supple.  Cardiovascular: Normal rate, regular rhythm, normal heart sounds and intact distal pulses.   Pulmonary/Chest: Effort normal and breath sounds normal. Not tachypneic. No respiratory distress. She has no decreased breath sounds. She has no wheezes. She has no rhonchi. She has no rales.  Lymphadenopathy:    She has no cervical adenopathy.  Neurological: She is oriented to person, place, and time. She appears lethargic.  Skin: Skin is warm and dry. She is not diaphoretic. No erythema.  Psychiatric: She has a normal mood and affect. Her behavior is normal.          Assessment & Plan:   Acute bronchitis, unspecified organism - Plan: albuterol (PROVENTIL) (2.5 MG/3ML) 0.083% nebulizer solution 2.5 mg  Shortness of breath - Plan: albuterol (PROVENTIL) (2.5 MG/3ML) 0.083% nebulizer solution 2.5 mg  Intrinsic asthma, mild intermittent, with acute exacerbation - Plan: albuterol (PROVENTIL) (2.5 MG/3ML) 0.083% nebulizer solution 2.5 mg  Arthritis of spine  Meds ordered this encounter  Medications  . montelukast (SINGULAIR) 10 MG tablet    Sig: TAKE 1 TABLET BY MOUTH DAILY    Dispense:  90 tablet    Refill:  1  . guaiFENesin (MUCINEX) 600 MG 12 hr tablet    Sig: Take 1 tablet (600 mg total) by mouth 2  (two) times daily as needed.    Dispense:  14 tablet    Refill:  1  . albuterol (PROAIR HFA) 108 (90 BASE) MCG/ACT inhaler    Sig: Inhale 2 puffs into the lungs every 4 (four) hours as needed for wheezing or shortness of breath.    Dispense:  18 g    Refill:  5  . DISCONTD: predniSONE (DELTASONE) 10 MG tablet    Sig: 6-5-4-3-2-1 tabs po qd    Dispense:  21 tablet    Refill:  0  . DISCONTD: azithromycin (ZITHROMAX) 250 MG tablet    Sig: Take 2 tabs PO x 1 dose, then 1 tab PO QD x 4 days    Dispense:  6 tablet    Refill:  0  . traMADol (ULTRAM) 50 MG tablet    Sig: Take 1 tablet (50 mg total) by mouth every 8 (eight) hours as needed.    Dispense:  30 tablet    Refill:  1  . albuterol (PROVENTIL) (2.5 MG/3ML) 0.083% nebulizer solution 2.5 mg    Sig:     [name], MD MPH

## 2013-10-15 ENCOUNTER — Ambulatory Visit (INDEPENDENT_AMBULATORY_CARE_PROVIDER_SITE_OTHER): Payer: Medicare Other

## 2013-10-15 ENCOUNTER — Ambulatory Visit (INDEPENDENT_AMBULATORY_CARE_PROVIDER_SITE_OTHER): Payer: Medicare Other | Admitting: Family Medicine

## 2013-10-15 ENCOUNTER — Ambulatory Visit: Payer: Medicare Other

## 2013-10-15 VITALS — BP 122/78 | HR 68 | Temp 97.6°F | Resp 16 | Ht 62.0 in | Wt 188.0 lb

## 2013-10-15 DIAGNOSIS — R05 Cough: Secondary | ICD-10-CM | POA: Diagnosis not present

## 2013-10-15 DIAGNOSIS — J4521 Mild intermittent asthma with (acute) exacerbation: Secondary | ICD-10-CM | POA: Diagnosis not present

## 2013-10-15 DIAGNOSIS — R059 Cough, unspecified: Secondary | ICD-10-CM

## 2013-10-15 DIAGNOSIS — J441 Chronic obstructive pulmonary disease with (acute) exacerbation: Secondary | ICD-10-CM | POA: Diagnosis not present

## 2013-10-15 LAB — POCT CBC
GRANULOCYTE PERCENT: 86.4 % — AB (ref 37–80)
HCT, POC: 38.5 % (ref 37.7–47.9)
Hemoglobin: 12.5 g/dL (ref 12.2–16.2)
Lymph, poc: 2 (ref 0.6–3.4)
MCH: 28.4 pg (ref 27–31.2)
MCHC: 32.5 g/dL (ref 31.8–35.4)
MCV: 87.3 fL (ref 80–97)
MID (cbc): 0.6 (ref 0–0.9)
MPV: 8.9 fL (ref 0–99.8)
POC Granulocyte: 16.7 — AB (ref 2–6.9)
POC LYMPH PERCENT: 10.3 %L (ref 10–50)
POC MID %: 3.3 % (ref 0–12)
Platelet Count, POC: 266 10*3/uL (ref 142–424)
RBC: 4.41 M/uL (ref 4.04–5.48)
RDW, POC: 15.5 %
WBC: 19.3 10*3/uL — AB (ref 4.6–10.2)

## 2013-10-15 LAB — POCT SEDIMENTATION RATE: POCT SED RATE: 37 mm/hr — AB (ref 0–22)

## 2013-10-15 MED ORDER — ALBUTEROL SULFATE (2.5 MG/3ML) 0.083% IN NEBU
2.5000 mg | INHALATION_SOLUTION | Freq: Once | RESPIRATORY_TRACT | Status: AC
  Administered 2013-10-15: 2.5 mg via RESPIRATORY_TRACT

## 2013-10-15 MED ORDER — LEVOFLOXACIN 500 MG PO TABS: 500.0000 mg | ORAL_TABLET | Freq: Every day | ORAL | Status: DC

## 2013-10-15 NOTE — Progress Notes (Signed)
Subjective:    Patient ID: Crystal Kerr, female    DOB: 10/01/1924, 80 y.o.   MRN: [id number] Chief Complaint  Patient presents with  . Follow-up    bronchitis    HPI  Was feeling more awake, alive yesterday but today is feeling more congested and coughing and wheezing more but not bringing anything up. Is more lethargic.  No f/c/sweats.  Used albuterol twice yesterday but not today. Using symbicort.  Nml GI/GU.  Has not needing mucinex.  Past Medical History  Diagnosis Date  . Asthma   . PVC's (premature ventricular contractions)   . Arthritis   . CHF (congestive heart failure)   . Atrial fibrillation    Current Outpatient Prescriptions on File Prior to Visit  Medication Sig Dispense Refill  . albuterol (PROAIR HFA) 108 (90 BASE) MCG/ACT inhaler Inhale 2 puffs into the lungs every 4 (four) hours as needed for wheezing or shortness of breath.  18 g  5  . amiodarone (PACERONE) 200 MG tablet Take 1 tablet (200 mg total) by mouth daily.  90 tablet  3  . azithromycin (ZITHROMAX) 250 MG tablet Take 2 tabs PO x 1 dose, then 1 tab PO QD x 4 days  6 tablet  0  . diltiazem (CARDIZEM CD) 240 MG 24 hr capsule TAKE ONE CAPSULE BY MOUTH EVERY DAY  30 capsule  3  . ELIQUIS 5 MG TABS tablet TAKE 1 TABLET (5 MG TOTAL) BY MOUTH 2 (TWO) TIMES DAILY.  60 tablet  3  . famotidine (PEPCID) 20 MG tablet One at bedtime  30 tablet  11  . guaiFENesin (MUCINEX) 600 MG 12 hr tablet Take 1 tablet (600 mg total) by mouth 2 (two) times daily as needed.  14 tablet  1  . ipratropium (ATROVENT) 0.03 % nasal spray Place 2 sprays into the nose 2 (two) times daily.  90 mL  1  . loratadine (CLARITIN) 10 MG tablet Take 10 mg by mouth daily.      . meclizine (ANTIVERT) 25 MG tablet Take 1 tablet (25 mg total) by mouth 3 (three) times daily as needed for dizziness.  30 tablet  0  . montelukast (SINGULAIR) 10 MG tablet TAKE 1 TABLET BY MOUTH DAILY  90 tablet  1  . pantoprazole (PROTONIX) 40 MG tablet Take 1  tablet (40 mg total) by mouth daily. Take 30-60 min before first meal of the day  30 tablet  11  . predniSONE (DELTASONE) 10 MG tablet 6-5-4-3-2-1 tabs po qd  21 tablet  0  . SYMBICORT 160-4.5 MCG/ACT inhaler INHALE 2 PUFFS FIRST THING IN THE MORNING AND ANOTHER 2 PUFFS 12 HOURS LATER  1 Inhaler  5  . traMADol (ULTRAM) 50 MG tablet Take 1 tablet (50 mg total) by mouth every 8 (eight) hours as needed.  30 tablet  1   No current facility-administered medications on file prior to visit.   No Known Allergies     Review of Systems  Constitutional: Positive for activity change and fatigue. Negative for fever, chills and diaphoresis.  HENT: Positive for congestion and postnasal drip. Negative for rhinorrhea and sore throat.   Respiratory: Positive for cough, chest tightness, shortness of breath and wheezing.   Cardiovascular: Negative for chest pain, palpitations and leg swelling.  Gastrointestinal: Negative for nausea, vomiting, abdominal pain, diarrhea and constipation.  Genitourinary: Negative for dysuria and decreased urine volume.  Hematological: Negative for adenopathy.       Objective:  BP 122/78  Pulse 68  Temp(Src) 97.6 F (36.4 C) (Oral)  Resp 16  Ht 5\' 2"  (1.575 m)  Wt 188 lb (85.276 kg)  BMI 34.38 kg/m2  SpO2 95%  Physical Exam  Constitutional: She is oriented to person, place, and time. She appears well-developed and well-nourished. She appears lethargic. She appears ill. No distress.  HENT:  Head: Normocephalic and atraumatic.  Right Ear: Tympanic membrane, external ear and ear canal normal.  Left Ear: Tympanic membrane, external ear and ear canal normal.  Nose: Rhinorrhea present. No mucosal edema. Right sinus exhibits no maxillary sinus tenderness. Left sinus exhibits no maxillary sinus tenderness.  Mouth/Throat: Uvula is midline and mucous membranes are normal. Posterior oropharyngeal erythema present. No oropharyngeal exudate or posterior oropharyngeal edema.    Eyes: Conjunctivae are normal. Right eye exhibits no discharge. Left eye exhibits no discharge. No scleral icterus.  Neck: Normal range of motion. Neck supple.  Cardiovascular: Normal rate, regular rhythm, normal heart sounds and intact distal pulses.   Pulmonary/Chest: Effort normal and breath sounds normal. Not tachypneic. No respiratory distress. She has no decreased breath sounds. She has no wheezes. She has no rhonchi. She has no rales.  Lymphadenopathy:    She has no cervical adenopathy.  Neurological: She is oriented to person, place, and time. She appears lethargic.  Skin: Skin is warm and dry. She is not diaphoretic. No erythema.  Psychiatric: She has a normal mood and affect. Her behavior is normal.         [facility] reading (PRIMARY) by  Dr. Brigitte Pulse.  CXR: increased bibasilar atelectasis but improved from prior with no discrete infiltrate.  EXAM: CHEST 2 VIEW  COMPARISON: July 15, 2013  FINDINGS: There is underlying emphysematous change. There is slight scarring in the left base. There is no edema or consolidation. Heart size is normal. Pulmonary vascularity reflects underlying emphysema. No adenopathy. No bone lesions.  IMPRESSION: Underlying emphysematous change. No edema or consolidation. Minimal scarring left base. No change in cardiac silhouette.  Assessment & Plan:   Cough - Plan: albuterol (PROVENTIL) (2.5 MG/3ML) 0.083% nebulizer solution 2.5 mg, POCT CBC, POCT SEDIMENTATION RATE, DG Chest 2 View  Obstructive chronic bronchitis with exacerbation  Intrinsic asthma, mild intermittent, with acute exacerbation  Meds ordered this encounter  Medications  . albuterol (PROVENTIL) (2.5 MG/3ML) 0.083% nebulizer solution 2.5 mg    Sig:   . levofloxacin (LEVAQUIN) 500 MG tablet    Sig: Take 1 tablet (500 mg total) by mouth daily.    Dispense:  7 tablet    Refill:  0     [name], MD MPH

## 2013-10-23 ENCOUNTER — Encounter: Payer: Self-pay | Admitting: Internal Medicine

## 2013-10-23 ENCOUNTER — Ambulatory Visit (INDEPENDENT_AMBULATORY_CARE_PROVIDER_SITE_OTHER): Payer: Medicare Other | Admitting: Internal Medicine

## 2013-10-23 VITALS — BP 138/60 | HR 74 | Temp 98.4°F | Ht 67.0 in | Wt 190.0 lb

## 2013-10-23 DIAGNOSIS — J4521 Mild intermittent asthma with (acute) exacerbation: Secondary | ICD-10-CM | POA: Diagnosis not present

## 2013-10-23 MED ORDER — PREDNISONE 10 MG PO TABS: ORAL_TABLET | ORAL | Status: DC

## 2013-10-23 NOTE — Progress Notes (Signed)
Subjective:    Patient ID: Crystal Kerr, female    DOB: July 12, 1924 MRN: [id number]  Brief patient profile:  19 yowf quit smoking 1983 prev eval Dr Joya Gaskins dx with asthma  Referred back to pulmonary clinic 02/17/13 by Dr Rod Can with cough/sob since [place] dec 2014    History of Present Illness  02/17/2013 1st [facility] office visit/ Crystal Kerr cc new sob/cough since [facility] Dec 2014 with white mucus worse day than night while on advair and better p albuterol for up to 6 h day = night s purulent sputum rec Symbicort 160 Take 2 puffs first thing in am and then another 2 puffs about 12 hours later.  Only use your albuterol (proair) as a rescue medication  Prednisone 10 mg take  4 each am x 2 days,   2 each am x 2 days,  1 each am x 2 days and stop  Try prilosec 20mg   Take 30-60 min before first meal of the day and Pepcid 20 mg one bedtime until cough is completely gone for at least a week without the need for cough suppression GERD diet       03/24/2013 Follow up  Returns for 2 week follow up Asthma .  Reports breathing 50-60% improved since last ov. Still having DOE but better.  Still weak. Was recently admitted for Gastroenteritis .  Had asthma flare during hospital stay, tx w/ steroid taper.   Also prilosec is helping her reflux .   07/05/2013 Acute OV   MW pt here for throat congestion with clear mucus production x1 month.   Denies any dyspnea, wheezing, tightness, cough, head congestion, PND, leg swelling, f/c/s/n/v. Using claritin without much help. Taking singulair.  No SABA use.  Feels breathing is ok , w/ no wheezing or dyspnea.  rec Stop Claritin. Begin Allegra 180 mg daily as needed. For drainage, drippy nose Use Chlortrimeton 4mg  At bedtime  X 3 days for drippy nose.  Saline nasal rinses As needed   Dymista Nasal  2 puffs daily each nostril, until sample is gone   07/10/2013 f/u ov/Crystal Kerr re: worse cough x > one month but actually "coughing daily x years"/ not on  pepcid at hs Chief Complaint  Patient presents with  . Acute Visit    Pt believes she has bronchitis.  c/o fatigue, dizziness, wheezing, prod cough with yellow mucous X3 days.   cough worse at hs ? Better p saba despite reporting still taking symbicort 160 2bid  No sob unless coughing rec Prednisone 10 mg take  4 each am x 2 days,  2 each am x 2 days,  1 each am x 2 days and stop  Pantoprazole (protonix) 40 mg  Take 30-60 min before first meal of the day and Pepcid 20 mg one at  bedtime and chlortrimeton 4 mg x 2 at bedtime For drainage take chlortrimeton (chlorpheniramine) 4 mg every 4 hours available over the counter (may cause drowsiness)   Best cough medication is delsym 2 tsp every 12 hours as needed GERD diet   08/07/2013 f/u ov/Crystal Kerr re: chronic cough on symbicort 160 2bid but very poor hfa  plus gerd rx  Chief Complaint  Patient presents with  . Follow-up    Pt states that her cough has improved since the last visit. She states "I very seldom have a cough". No new co's today.  She states that she rarely uses rescue inhaler.   noct cough much better, no am excess mucus.  rec symbicort 160 one twice daily and if doing great in couple weeks ok to try off and see if you notice a difference and if  worse restart at the dose that previously worked. Work on inhaler technique:        10/23/2013 f/u ov/Crystal Kerr re: acute flare of asthma, only using symbicort 160 one q am Chief Complaint  Patient presents with  . Acute Visit    Pt c/o increased DOE and cough "getting over bronchitis"- was seen by PCP on 10/12/13 and then again on 10/15/13.  She is using her rescue inhaler on average 4 x per day.   at baseline only needing the proair once a week then gradually worse and did not titrate the symbicort 160 back to the previous dose that worked/ assoc with variably productive cough but no purulent or bloody sputum.  Comfortable always at rest in sitting position   No obvious day to day or daytime  variabilty or assoc   cp or chest tightness, subjective wheeze overt sinus or hb symptoms. No unusual exp hx or h/o childhood pna/ asthma or knowledge of premature birth.  Sleeping ok without nocturnal  or early am exacerbation  of respiratory  c/o's or need for noct saba. Also denies any obvious fluctuation of symptoms with weather or environmental changes or other aggravating or alleviating factors except as outlined above   Current Medications, Allergies, Complete Past Medical History, Past Surgical History, Family History, and Social History were reviewed in [employer] record.  ROS  The following are not active complaints unless bolded sore throat, dysphagia, dental problems, itching, sneezing,  nasal congestion or excess/ purulent secretions, ear ache,   fever, chills, sweats, unintended wt loss, pleuritic or exertional cp, hemoptysis,  orthopnea pnd or leg swelling, presyncope, palpitations, heartburn, abdominal pain, anorexia, nausea, vomiting, diarrhea  or change in bowel or urinary habits, change in stools or urine, dysuria,hematuria,  rash, arthralgias, visual complaints, headache, numbness weakness or ataxia or problems with walking or coordination,  change in mood/affect or memory.                        Objective:  Physical Exam   amb wf nad/ mod hoarse   08/07/2013          188 > 10/23/2013  191  Wt Readings from Last 3 Encounters:  07/10/13 185 lb (83.915 kg)  07/05/13 184 lb 6.4 oz (83.643 kg)  04/14/13 177 lb (80.287 kg)      HEENT: nl dentition, turbinates, and orophanx. Nl external ear canals without cough reflex   NECK :  without JVD/Nodes/TM/ nl carotid upstrokes bilaterally   LUNGS: no acc muscle use,  Cough on exp/ exp wheeze bilaterally    CV:  RRR  no s3 or murmur or increase in P2, trace bilateral lower ext edema   ABD:  soft and nontender with nl excursion in the supine position. No bruits or organomegaly, bowel sounds  nl  MS:  warm without deformities, calf tenderness, cyanosis or clubbing  SKIN: warm and dry without lesions       CXR 10/15/13 Underlying emphysematous change. No edema or consolidation. Minimal  scarring left base. No change in cardiac silhouette        Assessment & Plan:

## 2013-10-23 NOTE — Assessment & Plan Note (Signed)
DDX of  difficult airways management all start with A and  include Adherence, Ace Inhibitors, Acid Reflux, Active Sinus Disease, Alpha 1 Antitripsin deficiency, Anxiety masquerading as Airways dz,  ABPA,  allergy(esp in young), Aspiration (esp in elderly), Adverse effects of DPI,  Active smokers, plus two Bs  = Bronchiectasis and Beta blocker use..and one C= CHF    In this case Adherence is the biggest issue and starts with  inability to use HFA effectively and also  understand that SABA treats the symptoms but doesn't get to the underlying problem (inflammation).  I used  the analogy of putting steroid cream on a rash to help explain the meaning of topical therapy and the need to get the drug to the target tissue.   The proper method of use, as well as anticipated side effects, of a metered-dose inhaler are discussed and demonstrated to the patient. Improved effectiveness after extensive coaching during this visit to a level of approximately  50% so needs to dose max symbicort 160 2bid  ? Allergic > Prednisone 10 mg take  4 each am x 2 days,   2 each am x 2 days,  1 each am x 2 days and stop  ? Acid (or non-acid) GERD > always difficult to exclude as up to 75% of pts in some series report no assoc GI/ Heartburn symptoms> rec continue max (24h)  acid suppression and diet restrictions/ reviewed     ? chf >  Lab Results  Component Value Date   PROBNP 104.0* 02/15/2013       Each maintenance medication was reviewed in detail including most importantly the difference between maintenance and as needed and under what circumstances the prns are to be used.  Please see instructions for details which were reviewed in writing and the patient given a copy.

## 2013-10-23 NOTE — Patient Instructions (Addendum)
Symbicort 160 Take 2 puffs first thing in am and then another 2 puffs about 12 hours later.   Work on inhaler technique:  relax and gently blow all the way out then take a nice smooth deep breath back in, triggering the inhaler at same time you start breathing in.  Hold for up to 5 seconds if you can.  Rinse and gargle with water when done  Only use your albuterol (proair) as a rescue medication to be used if you can't catch your breath by resting or doing a relaxed purse lip breathing pattern.  - The less you use it, the better it will work when you need it. - Ok to use up to 2 puffs  every 4 hours if you must but call for immediate appointment if use goes up over your usual need - Don't leave home without it !!  (think of it like the spare tire for your car)   Prednisone 10 mg take  4 each am x 2 days,   2 each am x 2 days,  1 each am x 2 days and stop   Please schedule a follow up office visit in 6 weeks, call sooner if needed

## 2013-10-27 ENCOUNTER — Encounter: Payer: Self-pay | Admitting: Radiology

## 2013-10-27 DIAGNOSIS — H908 Mixed conductive and sensorineural hearing loss, unspecified: Secondary | ICD-10-CM

## 2013-11-17 ENCOUNTER — Telehealth: Payer: Self-pay | Admitting: Cardiovascular Disease

## 2013-11-17 NOTE — Telephone Encounter (Signed)
New Message   Pt daughter called says that the pt has swelling in her feet and ankles. Please call to discuss

## 2013-11-19 DIAGNOSIS — Z23 Encounter for immunization: Secondary | ICD-10-CM | POA: Diagnosis not present

## 2013-11-20 NOTE — Telephone Encounter (Signed)
I spoke with the pt and for the past week she has noticed swelling in her ankles during the day.  The pt's swelling goes down during the night.  The pt mainly sits all day with her feet dangling and she takes Diltiazem which can cause swelling.  I instructed the pt to elevate her legs when sitting.  The pt consumes very little salt in her diet.  The pt is scheduled to see Dr Fletcher Anon 12/19/13 and I advised her to call the office if she has any worsening in symptoms.  Pt agreed with plan. The pt has no other complaints.

## 2013-11-24 ENCOUNTER — Other Ambulatory Visit: Payer: Self-pay | Admitting: Cardiovascular Disease

## 2013-12-02 DIAGNOSIS — Z961 Presence of intraocular lens: Secondary | ICD-10-CM | POA: Diagnosis not present

## 2013-12-04 ENCOUNTER — Encounter: Payer: Self-pay | Admitting: Internal Medicine

## 2013-12-04 ENCOUNTER — Ambulatory Visit (INDEPENDENT_AMBULATORY_CARE_PROVIDER_SITE_OTHER): Payer: Medicare Other | Admitting: Internal Medicine

## 2013-12-04 VITALS — BP 132/68 | HR 60 | Ht 63.0 in | Wt 194.0 lb

## 2013-12-04 DIAGNOSIS — R05 Cough: Secondary | ICD-10-CM | POA: Diagnosis not present

## 2013-12-04 DIAGNOSIS — R058 Other specified cough: Secondary | ICD-10-CM

## 2013-12-04 DIAGNOSIS — J4520 Mild intermittent asthma, uncomplicated: Secondary | ICD-10-CM

## 2013-12-04 MED ORDER — BUDESONIDE-FORMOTEROL FUMARATE 160-4.5 MCG/ACT IN AERO: INHALATION_SPRAY | RESPIRATORY_TRACT | Status: DC

## 2013-12-04 NOTE — Patient Instructions (Addendum)
Try symbicort 80 Take 2 puffs first thing in am and then another 2 puffs about 12 hours later - if works just as well then call us for an 80 Rx and when you finish your 160 supply we can chang  your over to the 80 dose long term  Please schedule a follow up visit in 3 months but call sooner if needed   Late add pfts on return

## 2013-12-04 NOTE — Progress Notes (Signed)
Subjective:    Patient ID: Crystal Kerr, female    DOB: April 26, 1924 MRN: [id number]  Brief patient profile:  36 yowf quit smoking 1983 prev eval Dr Joya Gaskins dx with asthma  Referred back to pulmonary clinic 02/17/13 by Dr Rod Can with cough/sob since Mid dec 2014    History of Present Illness  02/17/2013 1st [facility] office visit/ Crystal Kerr cc new sob/cough since [facility] Dec 2014 with white mucus worse day than night while on advair and better p albuterol for up to 6 h day = night s purulent sputum rec Symbicort 160 Take 2 puffs first thing in am and then another 2 puffs about 12 hours later.  Only use your albuterol (proair) as a rescue medication  Prednisone 10 mg take  4 each am x 2 days,   2 each am x 2 days,  1 each am x 2 days and stop  Try prilosec 20mg   Take 30-60 min before first meal of the day and Pepcid 20 mg one bedtime until cough is completely gone for at least a week without the need for cough suppression GERD diet       03/24/2013 Follow up  Returns for 2 week follow up Asthma .  Reports breathing 50-60% improved since last ov. Still having DOE but better.  Still weak. Was recently admitted for Gastroenteritis .  Had asthma flare during hospital stay, tx w/ steroid taper.   Also prilosec is helping her reflux .   07/05/2013 Acute OV   MW pt here for throat congestion with clear mucus production x1 month.   Denies any dyspnea, wheezing, tightness, cough, head congestion, PND, leg swelling, f/c/s/n/v. Using claritin without much help. Taking singulair.  No SABA use.  Feels breathing is ok , w/ no wheezing or dyspnea.  rec Stop Claritin. Begin Allegra 180 mg daily as needed. For drainage, drippy nose Use Chlortrimeton 4mg  At bedtime  X 3 days for drippy nose.  Saline nasal rinses As needed   Dymista Nasal  2 puffs daily each nostril, until sample is gone   07/10/2013 f/u ov/Anitria [name]: worse cough x > one month but actually "coughing daily x years"/ not on  pepcid at hs Chief Complaint  Patient presents with  . Acute Visit    Pt believes she has bronchitis.  c/o fatigue, dizziness, wheezing, prod cough with yellow mucous X3 days.   cough worse at hs ? Better p saba despite reporting still taking symbicort 160 2bid  No sob unless coughing rec Prednisone 10 mg take  4 each am x 2 days,  2 each am x 2 days,  1 each am x 2 days and stop  Pantoprazole (protonix) 40 mg  Take 30-60 min before first meal of the day and Pepcid 20 mg one at  bedtime and chlortrimeton 4 mg x 2 at bedtime For drainage take chlortrimeton (chlorpheniramine) 4 mg every 4 hours available over the counter (may cause drowsiness)   Best cough medication is delsym 2 tsp every 12 hours as needed GERD diet   08/07/2013 f/u ov/Bogdan Vivona re: chronic cough on symbicort 160 2bid but very poor hfa  plus gerd rx  Chief Complaint  Patient presents with  . Follow-up    Pt states that her cough has improved since the last visit. She states "I very seldom have a cough". No new co's today.  She states that she rarely uses rescue inhaler.   noct cough much better, no am excess mucus.  rec symbicort 160 one twice daily and if doing great in couple weeks ok to try off and see if you notice a difference and if  worse restart at the dose that previously worked. Work on inhaler technique:        10/23/2013 f/u ov/Crystal Kerr re: acute flare of asthma, only using symbicort 160 one q am Chief Complaint  Patient presents with  . Acute Visit    Pt c/o increased DOE and cough "getting over bronchitis"- was seen by PCP on 10/12/13 and then again on 10/15/13.  She is using her rescue inhaler on average 4 x per day.   at baseline only needing the proair once a week then gradually worse and did not titrate the symbicort 160 back to the previous dose that worked/ assoc with variably productive cough but no purulent or bloody sputum.  Comfortable always at rest in sitting position  rec Symbicort 160 Take 2 puffs  first thing in am and then another 2 puffs about 12 hours later.  Work on inhaler technique:   Only use your albuterol (proair) as a rescue medication -   Prednisone 10 mg take  4 each am x 2 days,   2 each am x 2 days,  1 each am x 2 days and stop    12/04/2013 f/u ov/Crystal Kerr re: asthma/ symbicort 2 bid at 160 / singulair maint rx  Chief Complaint  Patient presents with  . Follow-up    Pt states that her cough is much better since the last visit. She feels that her breathing has improved back to baseline "as long as I don't o out on the cold". She is using rescue inhaler on average 3 x per wk now.    mild doe x dog walking but better than baseline  No noct cough/wheeze or need for saba noct, rare daytime need   No obvious day to day or daytime variabilty or assoc excess or purulent sputum or   cp or chest tightness, subjective wheeze overt sinus or hb symptoms. No unusual exp hx or h/o childhood pna/ asthma or knowledge of premature birth.  Sleeping ok without nocturnal  or early am exacerbation  of respiratory  c/o's or need for noct saba. Also denies any obvious fluctuation of symptoms with weather or environmental changes or other aggravating or alleviating factors except as outlined above   Current Medications, Allergies, Complete Past Medical History, Past Surgical History, Family History, and Social History were reviewed in [employer] record.  ROS  The following are not active complaints unless bolded sore throat, dysphagia, dental problems, itching, sneezing,  nasal congestion or excess/ purulent secretions, ear ache,   fever, chills, sweats, unintended wt loss, pleuritic or exertional cp, hemoptysis,  orthopnea pnd or leg swelling, presyncope, palpitations, heartburn, abdominal pain, anorexia, nausea, vomiting, diarrhea  or change in bowel or urinary habits, change in stools or urine, dysuria,hematuria,  rash, arthralgias, visual complaints, headache, numbness  weakness or ataxia or problems with walking or coordination,  change in mood/affect or memory.                        Objective:  Physical Exam   amb wf nad/ mod hoarse   08/07/2013          188 > 10/23/2013  191 > 12/04/2013 194  Wt Readings from Last 3 Encounters:  07/10/13 185 lb (83.915 kg)  07/05/13 184 lb 6.4 oz (83.643 kg)  04/14/13  177 lb (80.287 kg)      HEENT: nl dentition, turbinates, and orophanx. Nl external ear canals without cough reflex   NECK :  without JVD/Nodes/TM/ nl carotid upstrokes bilaterally   LUNGS: no acc muscle use,  Clear to A and P    CV:  RRR  no s3 or murmur or increase in P2, trace bilateral lower ext edema   ABD:  soft and nontender with nl excursion in the supine position. No bruits or organomegaly, bowel sounds nl  MS:  warm without deformities, calf tenderness, cyanosis or clubbing  SKIN: warm and dry without lesions       CXR 10/15/13 Underlying emphysematous change. No edema or consolidation. Minimal  scarring left base. No change in cardiac silhouette        Assessment & Plan:

## 2013-12-05 ENCOUNTER — Encounter: Payer: Self-pay | Admitting: Internal Medicine

## 2013-12-05 NOTE — Assessment & Plan Note (Signed)
Continues much better with gerd rx so rec continue this until next ov when we will try to sort out her other chronic resp complaints (doe)

## 2013-12-05 NOTE — Assessment & Plan Note (Signed)
Much better overall  The proper method of use, as well as anticipated side effects, of a metered-dose inhaler are discussed and demonstrated to the patient. Improved effectiveness after extensive coaching during this visit to a level of approximately  75% which is quite a bit better than before so should be able to do stepdown to symbicort 80 2bid as long as continues to use proper technique  Need a baseline pft if possible to sort out chronic doe walking dog but doubt this represents sign copd or unaddressed airways inflammation    Each maintenance medication was reviewed in detail including most importantly the difference between maintenance and as needed and under what circumstances the prns are to be used.  Please see instructions for details which were reviewed in writing and the patient given a copy.

## 2013-12-19 ENCOUNTER — Encounter: Payer: Self-pay | Admitting: Cardiovascular Disease

## 2013-12-19 ENCOUNTER — Ambulatory Visit (INDEPENDENT_AMBULATORY_CARE_PROVIDER_SITE_OTHER): Payer: Medicare Other | Admitting: Cardiovascular Disease

## 2013-12-19 VITALS — BP 144/66 | HR 57 | Ht 63.0 in | Wt 192.4 lb

## 2013-12-19 DIAGNOSIS — I481 Persistent atrial fibrillation: Secondary | ICD-10-CM | POA: Diagnosis not present

## 2013-12-19 DIAGNOSIS — I4819 Other persistent atrial fibrillation: Secondary | ICD-10-CM

## 2013-12-19 DIAGNOSIS — R600 Localized edema: Secondary | ICD-10-CM | POA: Diagnosis not present

## 2013-12-19 MED ORDER — DILTIAZEM HCL ER COATED BEADS 120 MG PO CP24: 120.0000 mg | ORAL_CAPSULE | Freq: Every day | ORAL | Status: DC

## 2013-12-19 NOTE — Patient Instructions (Addendum)
Your physician has recommended you make the following change in your medication:  DECREASE Diltiazem CD to 120mg  one by mouth daily  Your physician wants you to follow-up in: 6 MONTHS with Dr Fletcher Anon.  You will receive a reminder letter in the mail two months in advance. If you don't receive a letter, please call our office to schedule the follow-up appointment.

## 2013-12-19 NOTE — Assessment & Plan Note (Signed)
Resolved with Amiodarone and Diltiazem.

## 2013-12-19 NOTE — Progress Notes (Signed)
HPI  This is an 80 year old female who is here today for a followup visit regarding atrial fibrillation. She was initially seen in 2014 for evaluation of dizziness and PVCs. She was noted to be orthostatic by physical exam. Echocardiogram showed normal LV systolic function without significant valvular abnormalities. Holter monitor confirmed excessive PVCs. 20% of her beats were actually PVCs. She was started on diltiazem extended release 120 mg once daily with significant improvement in her symptoms.  She was hospitalized at [facility] in February, 2015 for viral gastroenteritis. She was noted to be in atrial fibrillation with rapid ventricular response. She underwent TEE guided cardioversion and was placed on amiodarone. She has been maintaining sinus rhythm since then. She is doing very well with no chest pain, dyspnea or palpitations. She is having bilateral leg edema.    No Known Allergies   Current Outpatient Prescriptions on File Prior to Visit  Medication Sig Dispense Refill  . albuterol (PROAIR HFA) 108 (90 BASE) MCG/ACT inhaler Inhale 2 puffs into the lungs every 4 (four) hours as needed for wheezing or shortness of breath. 18 g 5  . amiodarone (PACERONE) 200 MG tablet Take 1 tablet (200 mg total) by mouth daily. 90 tablet 3  . budesonide-formoterol (SYMBICORT) 160-4.5 MCG/ACT inhaler INHALE 2 PUFFS FIRST THING IN THE MORNING AND ANOTHER 2 PUFFS 12 HOURS LATER 1 Inhaler 0  . diltiazem (CARDIZEM CD) 240 MG 24 hr capsule TAKE ONE CAPSULE BY MOUTH EVERY DAY 30 capsule 3  . ELIQUIS 5 MG TABS tablet TAKE 1 TABLET (5 MG TOTAL) BY MOUTH 2 (TWO) TIMES DAILY. 60 tablet 1  . famotidine (PEPCID) 20 MG tablet One at bedtime 30 tablet 11  . guaiFENesin (MUCINEX) 600 MG 12 hr tablet Take 1 tablet (600 mg total) by mouth 2 (two) times daily as needed. 14 tablet 1  . meclizine (ANTIVERT) 25 MG tablet Take 1 tablet (25 mg total) by mouth 3 (three) times daily as needed for dizziness. 30 tablet 0  .  montelukast (SINGULAIR) 10 MG tablet TAKE 1 TABLET BY MOUTH DAILY 90 tablet 1  . pantoprazole (PROTONIX) 40 MG tablet Take 1 tablet (40 mg total) by mouth daily. Take 30-60 min before first meal of the day 30 tablet 11  . traMADol (ULTRAM) 50 MG tablet Take 1 tablet (50 mg total) by mouth every 8 (eight) hours as needed. 30 tablet 1   No current facility-administered medications on file prior to visit.     Past Medical History  Diagnosis Date  . Asthma   . PVC's (premature ventricular contractions)   . Arthritis   . CHF (congestive heart failure)   . Atrial fibrillation      Past Surgical History  Procedure Laterality Date  . Appendectomy    . Cholecystectomy    . Tonsillectomy    . Oophrectomy    . Tee without cardioversion N/A 03/08/2013    Procedure: TRANSESOPHAGEAL ECHOCARDIOGRAM (TEE);  Surgeon: Larey Dresser, MD;  Location: [facility];  Service: Cardiovascular;  Laterality: N/A;  . Cardioversion N/A 03/08/2013    Procedure: CARDIOVERSION;  Surgeon: Larey Dresser, MD;  Location[facility] END[facility]COPY;  Service: Cardiovascular;  Laterality: N/A;     Family History  Problem Relation Age of Onset  . Heart disease      No family history     History   Social History  . Marital Status: Unknown    Spouse Name: N/A    Number of Children: N/A  .  Years of Education: N/A   Occupational History  . Retired    Social History Main Topics  . Smoking status: Former Smoker -- 1.00 packs/day for 20 years    Types: Cigarettes    Quit date: 04/13/1981  . Smokeless tobacco: Never Used  . Alcohol Use: Yes     Comment: Occasional  . Drug Use: No  . Sexual Activity: No   Other Topics Concern  . Not on file   Social History Narrative      PHYSICAL EXAM   BP 144/66 mmHg  Pulse 57  Ht 5\' 3"  (1.6 m)  Wt 192 lb 6.4 oz (87.272 kg)  BMI 34.09 kg/m2  Constitutional: She is oriented to person, place, and time. She appears well-developed and well-nourished. No distress.    HENT: No nasal discharge.  Head: Normocephalic and atraumatic.  Eyes: Pupils are equal and round. Right eye exhibits no discharge. Left eye exhibits no discharge.  Neck: Normal range of motion. Neck supple. No JVD present. No thyromegaly present.  Cardiovascular: Normal rate with occasional premature beats, regular rhythm, normal heart sounds. Exam reveals no gallop and no friction rub. No murmur heard.  Pulmonary/Chest: Effort normal and breath sounds normal. No stridor. No respiratory distress. She has no wheezes. She has no rales. She exhibits no tenderness.  Abdominal: Soft. Bowel sounds are normal. She exhibits no distension. There is no tenderness. There is no rebound and no guarding.  Musculoskeletal: Normal range of motion. She exhibits mild edema and no tenderness.  Neurological: She is alert and oriented to person, place, and time. Coordination normal.  Skin: Skin is warm and dry. No rash noted. She is not diaphoretic. No erythema. No pallor.  Psychiatric: She has a normal mood and affect. Her behavior is normal. Judgment and thought content normal.    EKG: sinus bradycardia with 1st degree AVB.   ASSESSMENT AND PLAN

## 2013-12-19 NOTE — Assessment & Plan Note (Signed)
She is maintaining in NSR with Amiodarone. She is mildly bradycardiac. I decreased Diltiazem to 120 mg once daily.  She is tolerating anticoagulation with no side effects.

## 2013-12-19 NOTE — Assessment & Plan Note (Signed)
Likely due to venous insufficieny and side effect of Diltiazem. I decreased the dose to 120 mg once daily. I advised her to elevate legs during the day and consider knee high support stockings. If this continues to be an issue, a small dose HCTZ can be considered.

## 2013-12-22 ENCOUNTER — Telehealth: Payer: Self-pay | Admitting: Internal Medicine

## 2013-12-22 MED ORDER — BUDESONIDE-FORMOTEROL FUMARATE 80-4.5 MCG/ACT IN AERO: 2.0000 | INHALATION_SPRAY | Freq: Two times a day (BID) | RESPIRATORY_TRACT | Status: DC

## 2013-12-22 NOTE — Telephone Encounter (Signed)
Per last OV pt was to try symbicort 80 and if worked well we could send in rx. Pt states symbicort is working well and would like Rx. Rx sent.

## 2014-01-02 ENCOUNTER — Ambulatory Visit (INDEPENDENT_AMBULATORY_CARE_PROVIDER_SITE_OTHER): Payer: Medicare Other

## 2014-01-02 ENCOUNTER — Ambulatory Visit (INDEPENDENT_AMBULATORY_CARE_PROVIDER_SITE_OTHER): Payer: Medicare Other | Admitting: Family Medicine

## 2014-01-02 VITALS — BP 140/70 | HR 62 | Temp 97.6°F | Resp 18 | Ht 61.5 in | Wt 195.0 lb

## 2014-01-02 DIAGNOSIS — R600 Localized edema: Secondary | ICD-10-CM

## 2014-01-02 DIAGNOSIS — M25532 Pain in left wrist: Secondary | ICD-10-CM | POA: Diagnosis not present

## 2014-01-02 DIAGNOSIS — G629 Polyneuropathy, unspecified: Secondary | ICD-10-CM

## 2014-01-02 DIAGNOSIS — R2681 Unsteadiness on feet: Secondary | ICD-10-CM | POA: Diagnosis not present

## 2014-01-02 DIAGNOSIS — A499 Bacterial infection, unspecified: Secondary | ICD-10-CM | POA: Diagnosis not present

## 2014-01-02 DIAGNOSIS — M25562 Pain in left knee: Secondary | ICD-10-CM

## 2014-01-02 DIAGNOSIS — N390 Urinary tract infection, site not specified: Secondary | ICD-10-CM

## 2014-01-02 LAB — POCT URINALYSIS DIPSTICK
Bilirubin, UA: NEGATIVE
Blood, UA: NEGATIVE
Glucose, UA: NEGATIVE
Ketones, UA: NEGATIVE
Nitrite, UA: NEGATIVE
Protein, UA: NEGATIVE
Spec Grav, UA: <=1.005
Urobilinogen, UA: 0.2
pH, UA: 5.5

## 2014-01-02 LAB — POCT UA - MICROSCOPIC ONLY
Bacteria, U Microscopic: NEGATIVE
Casts, Ur, LPF, POC: NEGATIVE
Crystals, Ur, HPF, POC: NEGATIVE
Mucus, UA: NEGATIVE
Yeast, UA: NEGATIVE

## 2014-01-02 LAB — POCT CBC
Granulocyte percent: 58.5 % (ref 37–80)
HCT, POC: 37.1 % — AB (ref 37.7–47.9)
Hemoglobin: 12 g/dL — AB (ref 12.2–16.2)
Lymph, poc: 3.3 (ref 0.6–3.4)
MCH, POC: 28.1 pg (ref 27–31.2)
MCHC: 32.4 g/dL (ref 31.8–35.4)
MCV: 86.8 fL (ref 80–97)
MID (cbc): 1 — AB (ref 0–0.9)
MPV: 8.2 fL (ref 0–99.8)
POC Granulocyte: 6.1 (ref 2–6.9)
POC LYMPH PERCENT: 31.8 %L (ref 10–50)
POC MID %: 9.7 %M (ref 0–12)
Platelet Count, POC: 224 10*3/uL (ref 142–424)
RBC: 4.28 M/uL (ref 4.04–5.48)
RDW, POC: 15.3 %
WBC: 10.4 10*3/uL — AB (ref 4.6–10.2)

## 2014-01-02 LAB — GLUCOSE, POCT (MANUAL RESULT ENTRY): POC Glucose: 81 mg/dL (ref 70–99)

## 2014-01-02 MED ORDER — CEPHALEXIN 500 MG PO CAPS: 500.0000 mg | ORAL_CAPSULE | Freq: Two times a day (BID) | ORAL | Status: DC

## 2014-01-02 NOTE — Progress Notes (Signed)
Chief Complaint:  Chief Complaint  Patient presents with  . Fall    2 times within the last 3 days  . Edema  . Bleeding/Bruising    HPI: Crystal Kerr is a 80 y.o. female who is here for recent falls that are unprovoked and without any prodrome sxs for the last 4 days Fall starting Saturday , walkign her dog. She fell and did not have any prodrome sxs, thought her dog may have been tugging her on leash. She felt unsteasy on her feet but not dizziness.  She ahd 2 more falls on MOnday, again no sxs ie CP, palpitations, acute SOB,diaphoresis, or dizziness,  HA, vision changes, confusion, urinary sxs, URI or LRI sxs . She did not feel that her heart was causing her any problems, she did not feel like she flipped back into Afib. She denies having CP or palpitations. She is on Eliquis for A fib, has a  H/o periperhal neuropathy. The second and third time it was happneing it was when she was walking , no positional.  Has not tried anything for this.  There is no family hx f Parkinson, however daughter states that she has noticed tremors in her moms hands when she is doing soemthing and also at rest lately.   Past Medical History  Diagnosis Date  . Asthma   . PVC's (premature ventricular contractions)   . Arthritis   . CHF (congestive heart failure)   . Atrial fibrillation    Past Surgical History  Procedure Laterality Date  . Appendectomy    . Cholecystectomy    . Tonsillectomy    . Oophrectomy    . Tee without cardioversion N/A 03/08/2013    Procedure: TRANSESOPHAGEAL ECHOCARDIOGRAM (TEE);  Surgeon: Larey Dresser, MD;  Location: [facility];  Service: Cardiovascular;  Laterality: N/A;  . Cardioversion N/A 03/08/2013    Procedure: CARDIOVERSION;  Surgeon: Larey Dresser, MD;  Location: [facility];  Service: Cardiovascular;  Laterality: N/A;   History   Social History  . Marital Status: Unknown    Spouse Name: N/A    Number of Children: N/A  . Years of  Education: N/A   Occupational History  . Retired    Social History Main Topics  . Smoking status: Former Smoker -- 1.00 packs/day for 20 years    Types: Cigarettes    Quit date: 04/13/1981  . Smokeless tobacco: Never Used  . Alcohol Use: Yes     Comment: Occasional  . Drug Use: No  . Sexual Activity: No   Other Topics Concern  . None   Social History Narrative   Family History  Problem Relation Age of Onset  . Heart disease      No family history   No Known Allergies Prior to Admission medications   Medication Sig Start Date End Date Taking? Authorizing Provider  albuterol (PROAIR HFA) 108 (90 BASE) MCG/ACT inhaler Inhale 2 puffs into the lungs every 4 (four) hours as needed for wheezing or shortness of breath. 10/12/13  Yes Shawnee Knapp, MD  amiodarone (PACERONE) 200 MG tablet Take 1 tablet (200 mg total) by mouth daily. 03/28/13  Yes Wellington Hampshire, MD  budesonide-formoterol (SYMBICORT) 80-4.5 MCG/ACT inhaler Inhale 2 puffs into the lungs 2 (two) times daily. 12/22/13  Yes Tanda Rockers, MD  diltiazem (CARDIZEM CD) 120 MG 24 hr capsule Take 1 capsule (120 mg total) by mouth daily. TAKE ONE CAPSULE BY MOUTH EVERY DAY 12/19/13  Yes Wellington Hampshire, MD  ELIQUIS 5 MG TABS tablet TAKE 1 TABLET (5 MG TOTAL) BY MOUTH 2 (TWO) TIMES DAILY. 11/27/13  Yes Wellington Hampshire, MD  famotidine (PEPCID) 20 MG tablet One at bedtime 09/14/13  Yes Tanda Rockers, MD  guaiFENesin (MUCINEX) 600 MG 12 hr tablet Take 1 tablet (600 mg total) by mouth 2 (two) times daily as needed. 10/12/13  Yes Shawnee Knapp, MD  meclizine (ANTIVERT) 25 MG tablet Take 1 tablet (25 mg total) by mouth 3 (three) times daily as needed for dizziness. 08/13/13  Yes Shawnee Knapp, MD  montelukast (SINGULAIR) 10 MG tablet TAKE 1 TABLET BY MOUTH DAILY 10/12/13  Yes Shawnee Knapp, MD  pantoprazole (PROTONIX) 40 MG tablet Take 1 tablet (40 mg total) by mouth daily. Take 30-60 min before first meal of the day 09/14/13  Yes Tanda Rockers, MD    traMADol (ULTRAM) 50 MG tablet Take 1 tablet (50 mg total) by mouth every 8 (eight) hours as needed. 10/12/13  Yes Shawnee Knapp, MD     ROS: The patient denies fevers, chills, night sweats, unintentional weight loss, chest pain, palpitations, wheezing, dyspnea on exertion, nausea, vomiting, abdominal pain, dysuria, hematuria, melena, weakness  All other systems have been reviewed and were otherwise negative with the exception of those mentioned in the HPI and as above.    PHYSICAL EXAM: Filed Vitals:   01/02/14 1635  BP: 140/70  Pulse: 62  Temp: 97.6 F (36.4 C)  Resp: 18   Filed Vitals:   01/02/14 1635  Height: 5' 1.5" (1.562 m)  Weight: 195 lb (88.451 kg)   Body mass index is 36.25 kg/(m^2).  General: Alert, no acute distress HEENT:  Normocephalic, atraumatic, oropharynx patent. EOMI, PERRLA, fundo exam nl, tm normal, no petechiae in oral mucosa Cardiovascular:  Regular rate and rhythm, no rubs murmurs or gallops.  No Carotid bruits, radial pulse intact. + right pedal edema. Left lower leg larger than right  Respiratory: Clear to auscultation bilaterally.  No wheezes, rales, or rhonchi.  No cyanosis, no use of accessory musculature GI: No organomegaly, abdomen is soft and non-tender, positive bowel sounds.  No masses. Skin: No rashes. Neurologic: Facial musculature symmetric. Cn 2-12 grossly intact Psychiatric: Patient is appropriate throughout our interaction. Lymphatic: No cervical lymphadenopathy Musculoskeletal: Gait slowed NEck exam nl + tenderness of right and left wrist, she has full ROM of Back exam -full ROM + left knee pain, ecchymosis, abrasion, neg lachman, + jt line tenderness, stable to varus and valgus , 5/5 strength Left ankle swowlen, ecchymotic, + DP, neg anterior drawer, 5/5 strength, full ROm but tender      LABS: Results for orders placed or performed in visit on 01/02/14  POCT UA - Microscopic Only  Result Value Ref Range   WBC, Ur, HPF, POC 4-8     RBC, urine, microscopic 0-1    Bacteria, U Microscopic neg    Mucus, UA neg    Epithelial cells, urine per micros 0-3    Crystals, Ur, HPF, POC neg    Casts, Ur, LPF, POC neg    Yeast, UA neg   POCT urinalysis dipstick  Result Value Ref Range   Color, UA yellow    Clarity, UA cloudy    Glucose, UA neg    Bilirubin, UA neg    Ketones, UA neg    Spec Grav, UA <=1.005    Blood, UA neg    pH, UA 5.5  Protein, UA neg    Urobilinogen, UA 0.2    Nitrite, UA neg    Leukocytes, UA moderate (2+)   POCT CBC  Result Value Ref Range   WBC 10.4 (A) 4.6 - 10.2 K/uL   Lymph, poc 3.3 0.6 - 3.4   POC LYMPH PERCENT 31.8 10 - 50 %L   MID (cbc) 1.0 (A) 0 - 0.9   POC MID % 9.7 0 - 12 %M   POC Granulocyte 6.1 2 - 6.9   Granulocyte percent 58.5 37 - 80 %G   RBC 4.28 4.04 - 5.48 M/uL   Hemoglobin 12.0 (A) 12.2 - 16.2 g/dL   HCT, POC 37.1 (A) 37.7 - 47.9 %   MCV 86.8 80 - 97 fL   MCH, POC 28.1 27 - 31.2 pg   MCHC 32.4 31.8 - 35.4 g/dL   RDW, POC 15.3 %   Platelet Count, POC 224 142 - 424 K/uL   MPV 8.2 0 - 99.8 fL  POCT glucose (manual entry)  Result Value Ref Range   POC Glucose 81 70 - 99 mg/dl     EKG/XRAY:   Primary read interpreted by Dr. Marin Comment at [facility]. Knee, ankle and wrist-neg for fx or dislocation   ASSESSMENT/PLAN: Encounter Diagnoses  Name Primary?  . Left wrist pain   . Knee pain, left   . Unstable gait   . Peripheral neuropathy   . Edema of left lower extremity   . UTI (urinary tract infection), bacterial Yes   80 yo female with a PMH of Asthma , Afib in Reg SR on Eliquis, CHF and also peripheral neuropathy in her hands and feet who presents with multiple falls in the last 3 days She has assymeteric swelling of her left leg as well, edema is present and is not consistent with being on Eliquis, will get doppler to rule out DVT. I don not suspect it is a DVT but it is swollenand not consistent with her CHF edema or being on Eliquis.  Orthostatics normal She has a UTI  which I think is the primary cause of her falls, willt reat with keflex and culture urine. The falls are also contributed to her chronic peripheral neuropathy and now exacerbated by the UTI F/u by phone after doppler and also after urine cx results return Will address tremors after results are obtained.   Gross sideeffects, risk and benefits, and alternatives of medications d/w patient. Patient is aware that all medications have potential sideeffects and we are unable to predict every sideeffect or drug-drug interaction that may occur.  Leotis Pain, DO 01/02/2014 6:34 PM

## 2014-01-03 ENCOUNTER — Encounter: Payer: Self-pay | Admitting: Family Medicine

## 2014-01-03 ENCOUNTER — Ambulatory Visit (HOSPITAL_BASED_OUTPATIENT_CLINIC_OR_DEPARTMENT_OTHER)
Admission: RE | Admit: 2014-01-03 | Discharge: 2014-01-03 | Disposition: A | Discharge status: Home / Self Care | Payer: Medicare Other | Source: Ambulatory Visit | Attending: Family Medicine | Admitting: Family Medicine

## 2014-01-03 DIAGNOSIS — R600 Localized edema: Secondary | ICD-10-CM | POA: Diagnosis not present

## 2014-01-03 DIAGNOSIS — W19XXXA Unspecified fall, initial encounter: Secondary | ICD-10-CM | POA: Diagnosis not present

## 2014-01-03 DIAGNOSIS — M25562 Pain in left knee: Secondary | ICD-10-CM

## 2014-01-03 DIAGNOSIS — I4891 Unspecified atrial fibrillation: Secondary | ICD-10-CM | POA: Insufficient documentation

## 2014-01-03 DIAGNOSIS — G629 Polyneuropathy, unspecified: Secondary | ICD-10-CM

## 2014-01-03 DIAGNOSIS — N390 Urinary tract infection, site not specified: Secondary | ICD-10-CM

## 2014-01-03 DIAGNOSIS — R2681 Unsteadiness on feet: Secondary | ICD-10-CM

## 2014-01-03 DIAGNOSIS — A499 Bacterial infection, unspecified: Secondary | ICD-10-CM

## 2014-01-03 DIAGNOSIS — M25532 Pain in left wrist: Secondary | ICD-10-CM

## 2014-01-03 DIAGNOSIS — S8992XA Unspecified injury of left lower leg, initial encounter: Secondary | ICD-10-CM | POA: Diagnosis not present

## 2014-01-03 LAB — COMPREHENSIVE METABOLIC PANEL WITH GFR
AST: 19 U/L (ref 0–37)
Albumin: 3.9 g/dL (ref 3.5–5.2)
BUN: 19 mg/dL (ref 6–23)
Calcium: 8.9 mg/dL (ref 8.4–10.5)
Chloride: 103 meq/L (ref 96–112)
Glucose, Bld: 81 mg/dL (ref 70–99)

## 2014-01-03 LAB — COMPREHENSIVE METABOLIC PANEL
ALT: 17 U/L (ref 0–35)
Alkaline Phosphatase: 99 U/L (ref 39–117)
CO2: 26 mEq/L (ref 19–32)
Creat: 1.25 mg/dL — ABNORMAL HIGH (ref 0.50–1.10)
Potassium: 4 mEq/L (ref 3.5–5.3)
Sodium: 138 mEq/L (ref 135–145)
Total Bilirubin: 0.3 mg/dL (ref 0.2–1.2)
Total Protein: 6.4 g/dL (ref 6.0–8.3)

## 2014-01-03 NOTE — Addendum Note (Signed)
Addended by: Constance Goltz on: 01/03/2014 07:29 PM   Modules accepted: Orders

## 2014-01-04 LAB — URINE CULTURE: Colony Count: 25000

## 2014-01-07 ENCOUNTER — Ambulatory Visit (INDEPENDENT_AMBULATORY_CARE_PROVIDER_SITE_OTHER): Payer: Medicare Other

## 2014-01-07 ENCOUNTER — Ambulatory Visit (INDEPENDENT_AMBULATORY_CARE_PROVIDER_SITE_OTHER): Payer: Medicare Other | Admitting: Family Medicine

## 2014-01-07 VITALS — BP 132/74 | HR 66 | Temp 98.0°F | Resp 18 | Ht 61.5 in | Wt 195.6 lb

## 2014-01-07 DIAGNOSIS — R0602 Shortness of breath: Secondary | ICD-10-CM

## 2014-01-07 DIAGNOSIS — Z8679 Personal history of other diseases of the circulatory system: Secondary | ICD-10-CM | POA: Diagnosis not present

## 2014-01-07 DIAGNOSIS — R062 Wheezing: Secondary | ICD-10-CM

## 2014-01-07 DIAGNOSIS — J45901 Unspecified asthma with (acute) exacerbation: Secondary | ICD-10-CM

## 2014-01-07 MED ORDER — ALBUTEROL SULFATE (2.5 MG/3ML) 0.083% IN NEBU: 2.5000 mg | INHALATION_SOLUTION | Freq: Once | RESPIRATORY_TRACT | Status: DC

## 2014-01-07 MED ORDER — IPRATROPIUM BROMIDE 0.02 % IN SOLN
0.5000 mg | Freq: Once | RESPIRATORY_TRACT | Status: AC
  Administered 2014-01-07: 0.5 mg via RESPIRATORY_TRACT

## 2014-01-07 MED ORDER — IPRATROPIUM-ALBUTEROL 20-100 MCG/ACT IN AERS: 1.0000 | INHALATION_SPRAY | Freq: Four times a day (QID) | RESPIRATORY_TRACT | Status: DC | PRN

## 2014-01-07 MED ORDER — ALBUTEROL SULFATE (2.5 MG/3ML) 0.083% IN NEBU
2.5000 mg | INHALATION_SOLUTION | Freq: Once | RESPIRATORY_TRACT | Status: AC
  Administered 2014-01-07: 2.5 mg via RESPIRATORY_TRACT

## 2014-01-07 MED ORDER — IPRATROPIUM BROMIDE 0.02 % IN SOLN: 0.5000 mg | Freq: Once | RESPIRATORY_TRACT | Status: DC

## 2014-01-07 MED ORDER — METHYLPREDNISOLONE SODIUM SUCC 125 MG IJ SOLR
125.0000 mg | Freq: Once | INTRAMUSCULAR | Status: AC
  Administered 2014-01-07: 125 mg via INTRAMUSCULAR

## 2014-01-07 MED ORDER — METHYLPREDNISOLONE SODIUM SUCC 125 MG IJ SOLR: 80.0000 mg | Freq: Once | INTRAMUSCULAR | Status: DC

## 2014-01-07 MED ORDER — METHYLPREDNISOLONE (PAK) 4 MG PO TABS: ORAL_TABLET | ORAL | Status: DC

## 2014-01-07 NOTE — Progress Notes (Addendum)
Patient ID: Crystal Kerr, female   DOB: 01-Apr-1924, 80 y.o.   MRN: [id number]    This chart was scribed for Rikki Spearing, DO by [facility], ED Scribe at [facility].The patient was seen in exam room 07 and the patient's care was started at 12:49 PM.  Chief Complaint:  Chief Complaint  Patient presents with   Shortness of Breath    5 days   HPI: Crystal Kerr is a 80 y.o. female with a history of asthma who is here for SOB, onset 5 days ago. Was feeling ok but then started ahving worsening sxs while going shopping today.  Pt is unsure what triggered, she believes it make be due to the weather or christmas decorations. She has wheezing, decreased activity and a productive cough as associated symptoms. Her cough is producing a mucous sputum. She has taken albuterol 3 times today for mild relief. She has not taken her mucinex. She says cats and dogs trigger her asthma. Pt denies CP or palpitations, PND, pedal edema She has a hx of CHF   Past Medical History  Diagnosis Date   Asthma    PVC's (premature ventricular contractions)    Arthritis    CHF (congestive heart failure)    Atrial fibrillation    Past Surgical History  Procedure Laterality Date   Appendectomy     Cholecystectomy     Tonsillectomy     Oophrectomy     Tee without cardioversion N/A 03/08/2013    Procedure: TRANSESOPHAGEAL ECHOCARDIOGRAM (TEE);  Surgeon: Larey Dresser, MD;  Location: [place];  Service: Cardiovascular;  Laterality: N/A;   Cardioversion N/A 03/08/2013    Procedure: CARDIOVERSION;  Surgeon: Larey Dresser, MD;  Location: [facility] ENDOSCOPY;  Service: Cardiovascular;  Laterality: N/A;   History   Social History   Marital Status: Unknown    Spouse Name: N/A    Number of Children: N/A   Years of Education: N/A   Occupational History   Retired    Social History Main Topics   Smoking status: Former Smoker -- 1.00 packs/day for 20 years    Types:  Cigarettes    Quit date: 04/13/1981   Smokeless tobacco: Never Used   Alcohol Use: Yes     Comment: Occasional   Drug Use: No   Sexual Activity: No   Other Topics Concern   None   Social History Narrative   Family History  Problem Relation Age of Onset   Heart disease      No family history   No Known Allergies Prior to Admission medications   Medication Sig Start Date End Date Taking? Authorizing Provider  albuterol (PROAIR HFA) 108 (90 BASE) MCG/ACT inhaler Inhale 2 puffs into the lungs every 4 (four) hours as needed for wheezing or shortness of breath. 10/12/13  Yes Shawnee Knapp, MD  amiodarone (PACERONE) 200 MG tablet Take 1 tablet (200 mg total) by mouth daily. 03/28/13  Yes Wellington Hampshire, MD  budesonide-formoterol (SYMBICORT) 80-4.5 MCG/ACT inhaler Inhale 2 puffs into the lungs 2 (two) times daily. 12/22/13  Yes Tanda Rockers, MD  cephALEXin (KEFLEX) 500 MG capsule Take 1 capsule (500 mg total) by mouth 2 (two) times daily. 01/02/14  Yes Thao P Le, DO  diltiazem (CARDIZEM CD) 120 MG 24 hr capsule Take 1 capsule (120 mg total) by mouth daily. TAKE ONE CAPSULE BY MOUTH EVERY DAY 12/19/13  Yes Wellington Hampshire, MD  ELIQUIS 5 MG TABS  tablet TAKE 1 TABLET (5 MG TOTAL) BY MOUTH 2 (TWO) TIMES DAILY. 11/27/13  Yes Wellington Hampshire, MD  famotidine (PEPCID) 20 MG tablet One at bedtime 09/14/13  Yes Tanda Rockers, MD  guaiFENesin (MUCINEX) 600 MG 12 hr tablet Take 1 tablet (600 mg total) by mouth 2 (two) times daily as needed. 10/12/13  Yes Shawnee Knapp, MD  meclizine (ANTIVERT) 25 MG tablet Take 1 tablet (25 mg total) by mouth 3 (three) times daily as needed for dizziness. 08/13/13  Yes Shawnee Knapp, MD  montelukast (SINGULAIR) 10 MG tablet TAKE 1 TABLET BY MOUTH DAILY 10/12/13  Yes Shawnee Knapp, MD  pantoprazole (PROTONIX) 40 MG tablet Take 1 tablet (40 mg total) by mouth daily. Take 30-60 min before first meal of the day 09/14/13  Yes Tanda Rockers, MD  traMADol (ULTRAM) 50 MG tablet Take 1  tablet (50 mg total) by mouth every 8 (eight) hours as needed. 10/12/13  Yes Shawnee Knapp, MD     ROS: The patient denies fevers, chills, night sweats, unintentional weight loss, chest pain, palpitations, nausea, vomiting, abdominal pain, dysuria, hematuria, melena, numbness, weakness, or tingling.   All other systems have been reviewed and were otherwise negative with the exception of those mentioned in the HPI and as above.    PHYSICAL EXAM: Filed Vitals:   01/07/14 1243  BP: 132/74  Pulse: 66  Temp: 98 F (36.7 C)  Resp: 18   SpO2- 97% Body mass index is 36.36 kg/(m^2).  General: Alert, no acute distress HEENT:  Normocephalic, atraumatic, oropharynx patent. EOMI, PERRLA, TMs are normal, no pharyngeal exudate.  Cardiovascular:  Regular rate and rhythm, no rubs murmurs or gallops.  Radial pulse intact. No pedal edema.  Respiratory: + wheezes and rhonchi.  No cyanosis, minor use of accessory musculature GI: No organomegaly, abdomen is soft and non-tender, positive bowel sounds.  No masses. Skin: No rashes. Neurologic: Facial musculature symmetric. Psychiatric: Patient is appropriate throughout our interaction. Lymphatic: No cervical lymphadenopathy Musculoskeletal: Gait intact.  LABS: Results for orders placed or performed in visit on 01/02/14  Urine culture  Result Value Ref Range   Colony Count 25,000 COLONIES/ML    Organism ID, Bacteria Multiple bacterial morphotypes present, none    Organism ID, Bacteria predominant. Suggest appropriate recollection if     Organism ID, Bacteria clinically indicated.   Comprehensive metabolic panel  Result Value Ref Range   Sodium 138 135 - 145 mEq/L   Potassium 4.0 3.5 - 5.3 mEq/L   Chloride 103 96 - 112 mEq/L   CO2 26 19 - 32 mEq/L   Glucose, Bld 81 70 - 99 mg/dL   BUN 19 6 - 23 mg/dL   Creat 1.25 (H) 0.50 - 1.10 mg/dL   Total Bilirubin 0.3 0.2 - 1.2 mg/dL   Alkaline Phosphatase 99 39 - 117 U/L   AST 19 0 - 37 U/L   ALT 17 0 - 35  U/L   Total Protein 6.4 6.0 - 8.3 g/dL   Albumin 3.9 3.5 - 5.2 g/dL   Calcium 8.9 8.4 - 10.5 mg/dL  POCT UA - Microscopic Only  Result Value Ref Range   WBC, Ur, HPF, POC 4-8    RBC, urine, microscopic 0-1    Bacteria, U Microscopic neg    Mucus, UA neg    Epithelial cells, urine per micros 0-3    Crystals, Ur, HPF, POC neg    Casts, Ur, LPF, POC neg  Yeast, UA neg   POCT urinalysis dipstick  Result Value Ref Range   Color, UA yellow    Clarity, UA cloudy    Glucose, UA neg    Bilirubin, UA neg    Ketones, UA neg    Spec Grav, UA <=1.005    Blood, UA neg    pH, UA 5.5    Protein, UA neg    Urobilinogen, UA 0.2    Nitrite, UA neg    Leukocytes, UA moderate (2+)   POCT CBC  Result Value Ref Range   WBC 10.4 (A) 4.6 - 10.2 K/uL   Lymph, poc 3.3 0.6 - 3.4   POC LYMPH PERCENT 31.8 10 - 50 %L   MID (cbc) 1.0 (A) 0 - 0.9   POC MID % 9.7 0 - 12 %M   POC Granulocyte 6.1 2 - 6.9   Granulocyte percent 58.5 37 - 80 %G   RBC 4.28 4.04 - 5.48 M/uL   Hemoglobin 12.0 (A) 12.2 - 16.2 g/dL   HCT, POC 37.1 (A) 37.7 - 47.9 %   MCV 86.8 80 - 97 fL   MCH, POC 28.1 27 - 31.2 pg   MCHC 32.4 31.8 - 35.4 g/dL   RDW, POC 15.3 %   Platelet Count, POC 224 142 - 424 K/uL   MPV 8.2 0 - 99.8 fL  POCT glucose (manual entry)  Result Value Ref Range   POC Glucose 81 70 - 99 mg/dl   EKG/XRAY:   Primary read interpreted by Dr. Marin Comment at [facility]. No new effusion or infiltrate Increase vascualr marking  ASSESSMENT/PLAN: Asthma-Felt better after neb treatment x 1  with atrovent and albuterol, no audible rhonchi, minimal wheezing on forced expriration, Rx combivent inhaler, she does not want a neb machine, IV Solumedrol x 1 , also medrol dose pack CHF-monitor for s/sx of CHF ie pedal edema, white productive cough, SOB/DOE/PND UTI-already on keflex for this   Gross sideeffects, risk and benefits, and alternatives of medications d/w patient. Patient is aware that all medications have potential  sideeffects and we are unable to predict every sideeffect or drug-drug interaction that may occur.  Rikki Spearing, DO 01/07/2014 3:08 PM    I personally performed the services described in this documentation which is scribed in my presence and has been reviewed and is accurate. Dr Marin Comment

## 2014-01-07 NOTE — Patient Instructions (Signed)

## 2014-01-08 ENCOUNTER — Telehealth: Payer: Self-pay

## 2014-01-08 NOTE — Telephone Encounter (Signed)
Spoke with daughter, told her about official xray results. She will call me and let me know how mom is doing tomorrow before we put her on another round of abx , she was on keflex for 5 days recently for UTI, currently she is doing well , breathing much better. She got Solumedrol 125 mg x 1 last night , she  did have a HA. Advise to not take po steroids and drink water. Hold off and see how her breathing does on her regular meds. IF combivent too expensive then continue with just albuterol inhaler every 6 hrs scheduled for now until sxs improve and then prn.

## 2014-01-08 NOTE — Telephone Encounter (Signed)
She was here 12/27 and was prescribed COMBIVENT RESTOMAT-20-100. This drug is around 300$. It is to expensive for her. She is wanting something that is similar but not as expensive. Please advise at  [phone]

## 2014-01-25 ENCOUNTER — Other Ambulatory Visit: Payer: Self-pay | Admitting: Cardiovascular Disease

## 2014-02-28 ENCOUNTER — Telehealth: Payer: Self-pay

## 2014-02-28 NOTE — Telephone Encounter (Signed)
Patient wanted to know if a motion sickness patch would be compatible with her current medications.  She is going on a cruise and wanted advise before she used them.  CB#: [phone]

## 2014-03-02 NOTE — Telephone Encounter (Signed)
There does not appear to be any problems with the Trans-Scop patch and her current medications.

## 2014-03-02 NOTE — Telephone Encounter (Signed)
Spoke with pt, advised message from [place]. Pt understood. 

## 2014-03-19 ENCOUNTER — Encounter: Payer: Self-pay | Admitting: Internal Medicine

## 2014-03-19 ENCOUNTER — Ambulatory Visit (INDEPENDENT_AMBULATORY_CARE_PROVIDER_SITE_OTHER): Payer: Medicare Other | Admitting: Internal Medicine

## 2014-03-19 VITALS — BP 120/60 | HR 63 | Temp 97.4°F | Ht 62.0 in | Wt 197.0 lb

## 2014-03-19 DIAGNOSIS — R0600 Dyspnea, unspecified: Secondary | ICD-10-CM | POA: Diagnosis not present

## 2014-03-19 DIAGNOSIS — J4520 Mild intermittent asthma, uncomplicated: Secondary | ICD-10-CM | POA: Diagnosis not present

## 2014-03-19 DIAGNOSIS — R05 Cough: Secondary | ICD-10-CM

## 2014-03-19 DIAGNOSIS — R058 Other specified cough: Secondary | ICD-10-CM

## 2014-03-19 LAB — PULMONARY FUNCTION TEST
DL/VA % PRED: 103 %
DL/VA: 4.7 ml/min/mmHg/L
DLCO unc % pred: 68 %
DLCO unc: 14.84 ml/min/mmHg
FEF 25-75 POST: 1.17 L/s
FEF 25-75 PRE: 0.83 L/s
FEF2575-%CHANGE-POST: 41 %
FEF2575-%PRED-PRE: 90 %
FEF2575-%Pred-Post: 128 %
FEV1-%CHANGE-POST: 9 %
FEV1-%PRED-POST: 85 %
FEV1-%PRED-PRE: 78 %
FEV1-Post: 1.28 L
FEV1-Pre: 1.17 L
FEV1FVC-%Change-Post: 1 %
FEV1FVC-%Pred-Pre: 102 %
FEV6-%Change-Post: 7 %
FEV6-%Pred-Post: 89 %
FEV6-%Pred-Pre: 82 %
FEV6-Post: 1.7 L
FEV6-Pre: 1.57 L
FEV6FVC-%Change-Post: 0 %
FEV6FVC-%Pred-Post: 107 %
FEV6FVC-%Pred-Pre: 107 %
FVC-%Change-Post: 7 %
FVC-%Pred-Post: 83 %
FVC-%Pred-Pre: 77 %
FVC-Post: 1.7 L
FVC-Pre: 1.58 L
POST FEV1/FVC RATIO: 75 %
PRE FEV6/FVC RATIO: 100 %
Post FEV6/FVC ratio: 100 %
Pre FEV1/FVC ratio: 74 %
RV % pred: 17 %
RV: 0.43 L
TLC % PRED: 72 %
TLC: 3.43 L

## 2014-03-19 MED ORDER — BUDESONIDE-FORMOTEROL FUMARATE 160-4.5 MCG/ACT IN AERO: INHALATION_SPRAY | RESPIRATORY_TRACT | Status: DC

## 2014-03-19 MED ORDER — PREDNISONE 10 MG PO TABS: ORAL_TABLET | ORAL | Status: DC

## 2014-03-19 NOTE — Progress Notes (Signed)
PFT done today. 

## 2014-03-19 NOTE — Progress Notes (Signed)
Subjective:    Patient ID: Crystal Kerr, female    DOB: April 26, 1924 MRN: [id number]  Brief patient profile:  36 yowf quit smoking 1983 prev eval Dr Joya Gaskins dx with asthma  Referred back to pulmonary clinic 02/17/13 by Dr Rod Can with cough/sob since Mid dec 2014    History of Present Illness  02/17/2013 1st [facility] office visit/ Crystal Kerr cc new sob/cough since [facility] Dec 2014 with white mucus worse day than night while on advair and better p albuterol for up to 6 h day = night s purulent sputum rec Symbicort 160 Take 2 puffs first thing in am and then another 2 puffs about 12 hours later.  Only use your albuterol (proair) as a rescue medication  Prednisone 10 mg take  4 each am x 2 days,   2 each am x 2 days,  1 each am x 2 days and stop  Try prilosec 20mg   Take 30-60 min before first meal of the day and Pepcid 20 mg one bedtime until cough is completely gone for at least a week without the need for cough suppression GERD diet       03/24/2013 Follow up  Returns for 2 week follow up Asthma .  Reports breathing 50-60% improved since last ov. Still having DOE but better.  Still weak. Was recently admitted for Gastroenteritis .  Had asthma flare during hospital stay, tx w/ steroid taper.   Also prilosec is helping her reflux .   07/05/2013 Acute OV   MW pt here for throat congestion with clear mucus production x1 month.   Denies any dyspnea, wheezing, tightness, cough, head congestion, PND, leg swelling, f/c/s/n/v. Using claritin without much help. Taking singulair.  No SABA use.  Feels breathing is ok , w/ no wheezing or dyspnea.  rec Stop Claritin. Begin Allegra 180 mg daily as needed. For drainage, drippy nose Use Chlortrimeton 4mg  At bedtime  X 3 days for drippy nose.  Saline nasal rinses As needed   Dymista Nasal  2 puffs daily each nostril, until sample is gone   07/10/2013 f/u ov/Crystal Kerr re: worse cough x > one month but actually "coughing daily x years"/ not on  pepcid at hs Chief Complaint  Patient presents with  . Acute Visit    Pt believes she has bronchitis.  c/o fatigue, dizziness, wheezing, prod cough with yellow mucous X3 days.   cough worse at hs ? Better p saba despite reporting still taking symbicort 160 2bid  No sob unless coughing rec Prednisone 10 mg take  4 each am x 2 days,  2 each am x 2 days,  1 each am x 2 days and stop  Pantoprazole (protonix) 40 mg  Take 30-60 min before first meal of the day and Pepcid 20 mg one at  bedtime and chlortrimeton 4 mg x 2 at bedtime For drainage take chlortrimeton (chlorpheniramine) 4 mg every 4 hours available over the counter (may cause drowsiness)   Best cough medication is delsym 2 tsp every 12 hours as needed GERD diet   08/07/2013 f/u ov/Crystal Kerr re: chronic cough on symbicort 160 2bid but very poor hfa  plus gerd rx  Chief Complaint  Patient presents with  . Follow-up    Pt states that her cough has improved since the last visit. She states "I very seldom have a cough". No new co's today.  She states that she rarely uses rescue inhaler.   noct cough much better, no am excess mucus.  rec symbicort 160 one twice daily and if doing great in couple weeks ok to try off and see if you notice a difference and if  worse restart at the dose that previously worked. Work on inhaler technique:        10/23/2013 f/u ov/Crystal Kerr re: acute flare of asthma, only using symbicort 160 one q am Chief Complaint  Patient presents with  . Acute Visit    Pt c/o increased DOE and cough "getting over bronchitis"- was seen by PCP on 10/12/13 and then again on 10/15/13.  She is using her rescue inhaler on average 4 x per day.   at baseline only needing the proair once a week then gradually worse and did not titrate the symbicort 160 back to the previous dose that worked/ assoc with variably productive cough but no purulent or bloody sputum.  Comfortable always at rest in sitting position  rec Symbicort 160 Take 2 puffs  first thing in am and then another 2 puffs about 12 hours later.  Work on inhaler technique:   Only use your albuterol (proair) as a rescue medication -   Prednisone 10 mg take  4 each am x 2 days,   2 each am x 2 days,  1 each am x 2 days and stop    12/04/2013 f/u ov/Crystal Kerr re: asthma/ symbicort 2 bid at 160 / singulair maint rx  Chief Complaint  Patient presents with  . Follow-up    Pt states that her cough is much better since the last visit. She feels that her breathing has improved back to baseline "as long as I don't o out on the cold". She is using rescue inhaler on average 3 x per wk now.   mild doe x dog walking but better than baseline  No noct cough/wheeze or need for saba noct, rare daytime need  rec Try symbicort 80 Take 2 puffs first thing in am and then another 2 puffs about 12 hours later - if works just as well then call us for an 80 Rx and when you finish your 160 supply we can chang  your over to the 80 dose long term    03/19/2014 f/u ov/Crystal Kerr re: dyspnea  s sign airflow osbt by pfts today p symbicort 80 in am/ on amiodarone chronically   Chief Complaint  Patient presents with  . Follow-up    Pt here for f/u PFT   Neuropathy is slowing her down still does HT leaning on cart ok  Going from kitchen to car exhausts her/ more difficult since lower dose symbicort Uses no more than one proair rx per day when over does it but if inactive doesn't use at all  No longer interested in walking the dog   No obvious day to day or daytime variabilty or assoc excess or purulent sputum or   cp or chest tightness, subjective wheeze overt sinus or hb symptoms. No unusual exp hx or h/o childhood pna/ asthma or knowledge of premature birth.  Sleeping ok without nocturnal  or early am exacerbation  of respiratory  c/o's or need for noct saba. Also denies any obvious fluctuation of symptoms with weather or environmental changes or other aggravating or alleviating factors except as outlined  above   Current Medications, Allergies, Complete Past Medical History, Past Surgical History, Family History, and Social History were reviewed in [employer] record.  ROS  The following are not active complaints unless bolded sore throat, dysphagia, dental problems, itching,  sneezing,  nasal congestion or excess/ purulent secretions, ear ache,   fever, chills, sweats, unintended wt loss, pleuritic or exertional cp, hemoptysis,  orthopnea pnd or leg swelling, presyncope, palpitations, heartburn, abdominal pain, anorexia, nausea, vomiting, diarrhea  or change in bowel or urinary habits, change in stools or urine, dysuria,hematuria,  rash, arthralgias, visual complaints, headache, numbness weakness or ataxia or problems with walking or coordination,  change in mood/affect or memory.                      Objective:  Physical Exam   amb wf nad/ mildly hoarse  08/07/2013          188 > 10/23/2013  191 > 12/04/2013 194 > 03/19/2014   197  Wt Readings from Last 3 Encounters:  07/10/13 185 lb (83.915 kg)  07/05/13 184 lb 6.4 oz (83.643 kg)  04/14/13 177 lb (80.287 kg)      HEENT: nl dentition, turbinates, and orophanx. Nl external ear canals without cough reflex   NECK :  without JVD/Nodes/TM/ nl carotid upstrokes bilaterally   LUNGS: no acc muscle use,  Clear to A and P    CV:  RRR  no s3 or murmur or increase in P2, trace bilateral lower ext edema   ABD:  soft and nontender with nl excursion in the supine position. No bruits or organomegaly, bowel sounds nl  MS:  warm without deformities, calf tenderness, cyanosis or clubbing  SKIN: warm and dry without lesions        I personally reviewed images and agree with radiology impression as follows:  CXR:  01/07/14  Subtle opacification over the right base as cannot exclude atelectasis versus infection.        Assessment & Plan:

## 2014-03-19 NOTE — Patient Instructions (Addendum)
Increase symbicort to 160 Take 2 puffs first thing in am and then another 2 puffs about 12 hours later.   Work on inhaler technique:  relax and gently blow all the way out then take a nice smooth deep breath back in, triggering the inhaler at same time you start breathing in.  Hold for up to 5 seconds if you can.  Rinse and gargle with water when done  Only use your albuterol as a rescue medication to be used if you can't catch your breath by resting or doing a relaxed purse lip breathing pattern.  - The less you use it, the better it will work when you need it. - Ok to use up to 2 puffs  every 4 hours if you must but call for immediate appointment if use goes up over your usual need - Don't leave home without it !!  (think of it like the spare tire for your car)   Prednisone 10 mg take  4 each am x 2 days,   2 each am x 2 days,  1 each am x 2 days and stop   Please schedule a follow up visit in 3 months but call sooner if needed  Late add:  Needs cxr on return

## 2014-03-22 ENCOUNTER — Telehealth: Payer: Self-pay

## 2014-03-22 DIAGNOSIS — Z20828 Contact with and (suspected) exposure to other viral communicable diseases: Secondary | ICD-10-CM

## 2014-03-22 NOTE — Telephone Encounter (Signed)
Patients daughter has the flu and wants to know if there is anything the patient can take to keep from getting it.

## 2014-03-23 ENCOUNTER — Other Ambulatory Visit: Payer: Self-pay | Admitting: Cardiovascular Disease

## 2014-03-23 ENCOUNTER — Encounter: Payer: Self-pay | Admitting: Internal Medicine

## 2014-03-23 MED ORDER — OSELTAMIVIR PHOSPHATE 75 MG PO CAPS: 75.0000 mg | ORAL_CAPSULE | Freq: Every day | ORAL | Status: DC

## 2014-03-23 NOTE — Telephone Encounter (Signed)
Can you call her and let her know I sent in Tamiflu 75 mg daily for the the next 10 days to helps prevent getting the flu for the patient. Thanks

## 2014-03-23 NOTE — Telephone Encounter (Signed)
Spoke with pt's daughter, advised Rx was sent.

## 2014-03-23 NOTE — Telephone Encounter (Signed)
Dr Marin Comment, Tamiflu?

## 2014-03-24 ENCOUNTER — Encounter: Payer: Self-pay | Admitting: Internal Medicine

## 2014-03-24 NOTE — Assessment & Plan Note (Signed)
12/04/2013 p extensive coaching HFA effectiveness =    75% so try symbicort 80 2bid - PFTs 03/20/14   FEV1  1.28 (85%) ratio 75 s resp to saba but took am symbicort and dlco 68 corrects to 103$ - 03/20/2014 p extensive coaching HFA effectiveness =    75% try symbiocrt 160 2bid  I had an extended discussion with the patient reviewing all relevant studies completed to date and  lasting 15 to 20 minutes of a 25 minute visit on the following ongoing concerns:  1) pfts reviewed - she clearly has no copd and not even clear how much asthma is present but she says she did better on the symbicort 160 so resume 2 bid   2) deconditioning appears to be a bigger problem at this point, reviewed

## 2014-03-24 NOTE — Assessment & Plan Note (Addendum)
-   03/24/2014  Walked RA x 3 laps @ 185 ft each stopped due to  Mild sob=fatigue moderate pace, no desats  Note she walks much faster / further than "across the room " hx suggests so ? Element of depress/ deconditioning are the main issue here. No further w/u needed for now based on today's walk

## 2014-04-07 ENCOUNTER — Ambulatory Visit (INDEPENDENT_AMBULATORY_CARE_PROVIDER_SITE_OTHER): Payer: Medicare Other | Admitting: Family Medicine

## 2014-04-07 VITALS — BP 132/50 | HR 70 | Temp 98.2°F | Resp 20 | Ht 62.0 in | Wt 194.0 lb

## 2014-04-07 DIAGNOSIS — H8110 Benign paroxysmal vertigo, unspecified ear: Secondary | ICD-10-CM

## 2014-04-07 MED ORDER — MECLIZINE HCL 25 MG PO TABS: 25.0000 mg | ORAL_TABLET | Freq: Three times a day (TID) | ORAL | Status: DC | PRN

## 2014-04-07 NOTE — Progress Notes (Signed)
This is an 81 year old woman, originally from [place], [place] who presents with some mild shortness of breath and dizziness over the last several days. She became quite nauseated last night with this.  Patient has a past medical history significant for positional vertigo as well. She's had no recent hearing loss, ringing in her ears, or otalgia. She does wear hearing aids however.  Patient also has some mild dyspnea on exertion which she says hasn't changed. She's had no chest pain.  Patient had a course of prednisone 2 weeks ago. She's had some persistent left ankle edema and some swelling under her lids which is gradually resolving as she gets further away from the last prednisone pill. Patient says that her breathing has not significant changed lately.  Objective: This very pleasant lady who is seen with her daughter. She's overweight, alert and articulate. She is in no acute distress.  HEENT: Unremarkable. We removed her hearing aids in her ear canals are normal with normal TMs. Cranial nerves III through XII are intact Chest: A she may have a couple faint wheezes but in general she has clear breath sounds bilaterally. Heart: Regular no murmur  Assessment: Patient has chronic COPD with asthma which seems to be further well-controlled. Her acute problem now is the positional vertigo. When she looks down she precipitates more dizziness. She is able to move her neck left and right without problem and is able look up without problem. Her gait is stable.  Plan: This chart was scribed in my presence and reviewed by me personally.    ICD-9-CM ICD-10-CM   1. Benign paroxysmal positional vertigo, unspecified laterality 386.11 H81.10 meclizine (ANTIVERT) 25 MG tablet   Patient is told to call me if the symptoms are not resolving over the next couple days with the meclizine.  Signed, Robyn Haber, MD

## 2014-04-07 NOTE — Patient Instructions (Signed)
Benign Positional Vertigo Vertigo means you feel like you or your surroundings are moving when they are not. Benign positional vertigo is the most common form of vertigo. Benign means that the cause of your condition is not serious. Benign positional vertigo is more common in older adults. CAUSES  Benign positional vertigo is the result of an upset in the labyrinth system. This is an area in the middle ear that helps control your balance. This may be caused by a viral infection, head injury, or repetitive motion. However, often no specific cause is found. SYMPTOMS  Symptoms of benign positional vertigo occur when you move your head or eyes in different directions. Some of the symptoms may include:  Loss of balance and falls.  Vomiting.  Blurred vision.  Dizziness.  Nausea.  Involuntary eye movements (nystagmus). DIAGNOSIS  Benign positional vertigo is usually diagnosed by physical exam. If the specific cause of your benign positional vertigo is unknown, your caregiver may perform imaging tests, such as magnetic resonance imaging (MRI) or computed tomography (CT). TREATMENT  Your caregiver may recommend movements or procedures to correct the benign positional vertigo. Medicines such as meclizine, benzodiazepines, and medicines for nausea may be used to treat your symptoms. In rare cases, if your symptoms are caused by certain conditions that affect the inner ear, you may need surgery. HOME CARE INSTRUCTIONS   Follow your caregiver's instructions.  Move slowly. Do not make sudden body or head movements.  Avoid driving.  Avoid operating heavy machinery.  Avoid performing any tasks that would be dangerous to you or others during a vertigo episode.  Drink enough fluids to keep your urine clear or pale yellow. SEEK IMMEDIATE MEDICAL CARE IF:   You develop problems with walking, weakness, numbness, or using your arms, hands, or legs.  You have difficulty speaking.  You develop  severe headaches.  Your nausea or vomiting continues or gets worse.  You develop visual changes.  Your family or friends notice any behavioral changes.  Your condition gets worse.  You have a fever.  You develop a stiff neck or sensitivity to light. MAKE SURE YOU:   Understand these instructions.  Will watch your condition.  Will get help right away if you are not doing well or get worse. Document Released: 10/06/2005 Document Revised: 03/23/2011 Document Reviewed: 09/18/2010 Exit[employer] Patient Information 2015 [employer]. This information is not intended to replace advice given to you by your health care provider. Make sure you discuss any questions you have with your health care provider.    

## 2014-04-28 ENCOUNTER — Other Ambulatory Visit: Payer: Self-pay | Admitting: Family Medicine

## 2014-04-30 NOTE — Telephone Encounter (Signed)
Dr L, do you want to give RFs? 

## 2014-05-30 ENCOUNTER — Other Ambulatory Visit: Payer: Self-pay | Admitting: Cardiovascular Disease

## 2014-05-31 ENCOUNTER — Other Ambulatory Visit: Payer: Self-pay | Admitting: Family Medicine

## 2014-05-31 NOTE — Telephone Encounter (Signed)
Please review for refill. THanks!! 

## 2014-06-16 ENCOUNTER — Ambulatory Visit (INDEPENDENT_AMBULATORY_CARE_PROVIDER_SITE_OTHER): Payer: Medicare Other | Admitting: Emergency Medicine

## 2014-06-16 ENCOUNTER — Ambulatory Visit (INDEPENDENT_AMBULATORY_CARE_PROVIDER_SITE_OTHER): Payer: Medicare Other

## 2014-06-16 VITALS — BP 124/72 | HR 64 | Temp 98.0°F | Resp 17 | Ht 62.0 in | Wt 185.0 lb

## 2014-06-16 DIAGNOSIS — M25511 Pain in right shoulder: Secondary | ICD-10-CM

## 2014-06-16 MED ORDER — HYDROCODONE-ACETAMINOPHEN 5-325 MG PO TABS: ORAL_TABLET | ORAL | Status: DC

## 2014-06-16 NOTE — Patient Instructions (Signed)

## 2014-06-16 NOTE — Progress Notes (Signed)
   Subjective:  This chart was scribed for Nena Jordan, MD by [facility], medical scribe at [facility].The patient was seen in exam room 03 and the patient's care was started at 12:45 PM.   Patient ID: Crystal Kerr, female    DOB: 1924/07/06, 81 y.o.   MRN: [id number] Chief Complaint  Patient presents with  . Shoulder Pain    C/O deep pain-right shoulder, pain level at 8, started 3 weeks ago.    HPI HPI Comments: Crystal Kerr is a 81 y.o. female with a history of arthritis who presents to [facility] complaining of progressively worsening right shoulder pain  for more than three weeks. She says the pain has becoming more constant and more painful. She has had  cortisone injections in the past but it only provides relief for one month. No formal physical therapy. She has tried to exercise this morning but was unable to due to the pain. No recent falls.  Review of Systems  Musculoskeletal: Positive for arthralgias.      Objective:  BP 124/72 mmHg  Pulse 64  Temp(Src) 98 F (36.7 C) (Oral)  Resp 17  Ht 5\' 2"  (1.575 m)  Wt 185 lb (83.915 kg)  BMI 33.83 kg/m2  SpO2 93% Physical Exam  Vitals reviewed. CONSTITUTIONAL: Well developed/well nourished HEAD: Normocephalic/atraumatic EYES: EOMI/PERRL ENMT: Mucous membranes moist NECK: supple no meningeal signs SPINE/BACK:entire spine nontender CV: S1/S2 noted, no murmurs/rubs/gallops noted LUNGS: Lungs are clear to auscultation bilaterally, no apparent distress ABDOMEN: soft, nontender, no rebound or guarding, bowel sounds noted throughout abdomen GU:no cva tenderness NEURO: Pt is awake/alert/appropriate, moves all extremitiesx4.  No facial droop.   EXTREMITIES: pulses normal/equal. Diffuse tenderness of the right shoulder. She has pain with abduction, as well as internal and external rotation. SKIN: warm, color normal PSYCH: no abnormalities of mood noted, alert and oriented  to situation   [facility] reading (PRIMARY) by  Dr.Cathi Hazan there is severe osteoarthritis involving the glenoid fossa.   Assessment & Plan:  Patient with severe osteoarthritis involving the glenoid fossa of the right shoulder. Referral made to [facility] for their evaluation. She was given a few hydrocodone to take sparingly half tablet and instructed about cautions regarding falls.I personally performed the services described in this documentation, which was scribed in my presence. The recorded information has been reviewed and is accurate.  Nena Jordan, MD

## 2014-06-19 DIAGNOSIS — M25511 Pain in right shoulder: Secondary | ICD-10-CM | POA: Diagnosis not present

## 2014-06-27 ENCOUNTER — Other Ambulatory Visit: Payer: Self-pay

## 2014-06-27 DIAGNOSIS — M13811 Other specified arthritis, right shoulder: Secondary | ICD-10-CM | POA: Diagnosis not present

## 2014-06-27 DIAGNOSIS — M25511 Pain in right shoulder: Secondary | ICD-10-CM | POA: Diagnosis not present

## 2014-06-27 MED ORDER — APIXABAN 5 MG PO TABS: 5.0000 mg | ORAL_TABLET | Freq: Two times a day (BID) | ORAL | Status: DC

## 2014-06-29 ENCOUNTER — Other Ambulatory Visit: Payer: Self-pay

## 2014-07-03 DIAGNOSIS — M13811 Other specified arthritis, right shoulder: Secondary | ICD-10-CM | POA: Diagnosis not present

## 2014-07-03 DIAGNOSIS — M25511 Pain in right shoulder: Secondary | ICD-10-CM | POA: Diagnosis not present

## 2014-07-06 DIAGNOSIS — M25511 Pain in right shoulder: Secondary | ICD-10-CM | POA: Diagnosis not present

## 2014-07-06 DIAGNOSIS — M13811 Other specified arthritis, right shoulder: Secondary | ICD-10-CM | POA: Diagnosis not present

## 2014-07-09 ENCOUNTER — Other Ambulatory Visit: Payer: Self-pay

## 2014-07-10 DIAGNOSIS — M25511 Pain in right shoulder: Secondary | ICD-10-CM | POA: Diagnosis not present

## 2014-07-10 DIAGNOSIS — M13811 Other specified arthritis, right shoulder: Secondary | ICD-10-CM | POA: Diagnosis not present

## 2014-07-24 ENCOUNTER — Encounter: Payer: Self-pay | Admitting: Cardiovascular Disease

## 2014-07-24 ENCOUNTER — Ambulatory Visit (INDEPENDENT_AMBULATORY_CARE_PROVIDER_SITE_OTHER): Payer: Medicare Other | Admitting: Cardiovascular Disease

## 2014-07-24 VITALS — BP 144/52 | HR 60 | Ht 63.5 in | Wt 182.1 lb

## 2014-07-24 DIAGNOSIS — I481 Persistent atrial fibrillation: Secondary | ICD-10-CM | POA: Diagnosis not present

## 2014-07-24 DIAGNOSIS — R600 Localized edema: Secondary | ICD-10-CM | POA: Diagnosis not present

## 2014-07-24 DIAGNOSIS — I493 Ventricular premature depolarization: Secondary | ICD-10-CM | POA: Diagnosis not present

## 2014-07-24 DIAGNOSIS — I4819 Other persistent atrial fibrillation: Secondary | ICD-10-CM

## 2014-07-24 NOTE — Assessment & Plan Note (Signed)
Likely due to chronic venous insufficiency. This improved with decreasing the dose of diltiazem.

## 2014-07-24 NOTE — Patient Instructions (Signed)
Medication Instructions:  Your physician recommends that you continue on your current medications as directed. Please refer to the Current Medication list given to you today.   Labwork: none  Testing/Procedures: none  Follow-Up: Your physician wants you to follow-up in: 6 months.  You will receive a reminder letter in the mail two months in advance. If you don't receive a letter, please call our office to schedule the follow-up appointment.       

## 2014-07-24 NOTE — Assessment & Plan Note (Signed)
She is maintaining in sinus rhythm with amiodarone. I might consider decreasing the dose to 100 mg daily in the near future. Most recent CMP in November of last year showed normal liver function. Creatinine was 1.25. She is currently on Eliquis 5 mg twice daily.

## 2014-07-24 NOTE — Progress Notes (Signed)
HPI  This is an 81 year old female who is here today for a followup visit regarding atrial fibrillation. She was initially seen in 2014 for evaluation of dizziness and PVCs. She was noted to be orthostatic by physical exam. Echocardiogram showed normal LV systolic function without significant valvular abnormalities. Holter monitor confirmed excessive PVCs. 20% of her beats were actually PVCs. She was started on diltiazem extended release 120 mg once daily with significant improvement in her symptoms.  She was hospitalized at [facility] in February, 2015 for viral gastroenteritis. She was noted to be in atrial fibrillation with rapid ventricular response. She underwent TEE guided cardioversion and was placed on amiodarone. She has been maintaining sinus rhythm since then. During last visit, I decreased the dose of diltiazem due to bilateral leg edema. She reports improvement in this. She continues to have dizziness mostly happening when she stands up. She also has what seems to be vertigo. Otherwise she denies any palpitations or chest pain.   No Known Allergies   Current Outpatient Prescriptions on File Prior to Visit  Medication Sig Dispense Refill  . albuterol (PROAIR HFA) 108 (90 BASE) MCG/ACT inhaler Inhale 2 puffs into the lungs every 4 (four) hours as needed for wheezing or shortness of breath. 18 g 5  . amiodarone (PACERONE) 200 MG tablet TAKE 1 TABLET (200 MG TOTAL) BY MOUTH DAILY. 90 tablet 3  . apixaban (ELIQUIS) 5 MG TABS tablet Take 1 tablet (5 mg total) by mouth 2 (two) times daily. 60 tablet 2  . budesonide-formoterol (SYMBICORT) 160-4.5 MCG/ACT inhaler Take 2 puffs first thing in am and then another 2 puffs about 12 hours later. 1 Inhaler 11  . diltiazem (CARDIZEM CD) 120 MG 24 hr capsule Take 1 capsule (120 mg total) by mouth daily. TAKE ONE CAPSULE BY MOUTH EVERY DAY 30 capsule 8  . famotidine (PEPCID) 20 MG tablet One at bedtime 30 tablet 11  . guaiFENesin (MUCINEX) 600 MG 12 hr  tablet Take 1 tablet (600 mg total) by mouth 2 (two) times daily as needed. 14 tablet 1  . HYDROcodone-acetaminophen (NORCO) 5-325 MG per tablet One half tablet every 8 hours 20 tablet 0  . ipratropium (ATROVENT) 0.03 % nasal spray Place 2 sprays into both nostrils at bedtime.  6  . meclizine (ANTIVERT) 25 MG tablet TAKE 1 TABLET (25 MG TOTAL) BY MOUTH 3 (THREE) TIMES DAILY AS NEEDED FOR DIZZINESS. 30 tablet 1  . montelukast (SINGULAIR) 10 MG tablet TAKE 1 TABLET BY MOUTH ONCE DAILY 90 tablet 0  . pantoprazole (PROTONIX) 40 MG tablet Take 1 tablet (40 mg total) by mouth daily. Take 30-60 min before first meal of the day 30 tablet 11  . traMADol (ULTRAM) 50 MG tablet Take 1 tablet (50 mg total) by mouth every 8 (eight) hours as needed. 30 tablet 1   Current Facility-Administered Medications on File Prior to Visit  Medication Dose Route Frequency Provider Last Rate Last Dose  . ipratropium (ATROVENT) nebulizer solution 0.5 mg  0.5 mg Nebulization Once Harrison Mons, PA-C         Past Medical History  Diagnosis Date  . Asthma   . PVC's (premature ventricular contractions)   . Arthritis   . CHF (congestive heart failure)   . Atrial fibrillation      Past Surgical History  Procedure Laterality Date  . Appendectomy    . Cholecystectomy    . Tonsillectomy    . Oophrectomy    . Tee without cardioversion  N/A 03/08/2013    Procedure: TRANSESOPHAGEAL ECHOCARDIOGRAM (TEE);  Surgeon: Larey Dresser, MD;  Location: [place];  Service: Cardiovascular;  Laterality: N/A;  . Cardioversion N/A 03/08/2013    Procedure: CARDIOVERSION;  Surgeon: Larey Dresser, MD;  Location: [facility] ENDOSCOPY;  Service: Cardiovascular;  Laterality: N/A;     Family History  Problem Relation Age of Onset  . Heart disease      No family history  . Heart failure Mother   . Diabetes Father      History   Social History  . Marital Status: Unknown    Spouse Name: N/A  . Number of Children: N/A  . Years of  Education: N/A   Occupational History  . Retired    Social History Main Topics  . Smoking status: Former Smoker -- 1.00 packs/day for 20 years    Types: Cigarettes    Quit date: 04/13/1981  . Smokeless tobacco: Never Used  . Alcohol Use: Yes     Comment: Occasional  . Drug Use: No  . Sexual Activity: No   Other Topics Concern  . Not on file   Social History Narrative      PHYSICAL EXAM   BP 144/52 mmHg  Pulse 60  Ht 5' 3.5" (1.613 m)  Wt 182 lb 1.9 oz (82.609 kg)  BMI 31.75 kg/m2  SpO2 96%  Constitutional: She is oriented to person, place, and time. She appears well-developed and well-nourished. No distress.  HENT: No nasal discharge.  Head: Normocephalic and atraumatic.  Eyes: Pupils are equal and round. Right eye exhibits no discharge. Left eye exhibits no discharge.  Neck: Normal range of motion. Neck supple. No JVD present. No thyromegaly present.  Cardiovascular: Normal rate with occasional premature beats, regular rhythm, normal heart sounds. Exam reveals no gallop and no friction rub. No murmur heard.  Pulmonary/Chest: Effort normal and breath sounds normal. No stridor. No respiratory distress. She has no wheezes. She has no rales. She exhibits no tenderness.  Abdominal: Soft. Bowel sounds are normal. She exhibits no distension. There is no tenderness. There is no rebound and no guarding.  Musculoskeletal: Normal range of motion. She exhibits mild edema and no tenderness.  Neurological: She is alert and oriented to person, place, and time. Coordination normal.  Skin: Skin is warm and dry. No rash noted. She is not diaphoretic. No erythema. No pallor.  Psychiatric: She has a normal mood and affect. Her behavior is normal. Judgment and thought content normal.     ASSESSMENT AND PLAN

## 2014-07-24 NOTE — Assessment & Plan Note (Signed)
These resolved with treatment.

## 2014-07-26 ENCOUNTER — Ambulatory Visit (INDEPENDENT_AMBULATORY_CARE_PROVIDER_SITE_OTHER): Payer: Medicare Other | Admitting: Internal Medicine

## 2014-07-26 ENCOUNTER — Encounter: Payer: Self-pay | Admitting: Internal Medicine

## 2014-07-26 ENCOUNTER — Ambulatory Visit (INDEPENDENT_AMBULATORY_CARE_PROVIDER_SITE_OTHER)
Admission: RE | Admit: 2014-07-26 | Discharge: 2014-07-26 | Disposition: A | Discharge status: Home / Self Care | Payer: Medicare Other | Source: Ambulatory Visit | Attending: Internal Medicine | Admitting: Internal Medicine

## 2014-07-26 VITALS — BP 120/76 | HR 59 | Ht 68.0 in | Wt 182.2 lb

## 2014-07-26 DIAGNOSIS — J45909 Unspecified asthma, uncomplicated: Secondary | ICD-10-CM

## 2014-07-26 DIAGNOSIS — R05 Cough: Secondary | ICD-10-CM | POA: Diagnosis not present

## 2014-07-26 DIAGNOSIS — R058 Other specified cough: Secondary | ICD-10-CM

## 2014-07-26 MED ORDER — ALBUTEROL SULFATE HFA 108 (90 BASE) MCG/ACT IN AERS: 2.0000 | INHALATION_SPRAY | RESPIRATORY_TRACT | Status: DC | PRN

## 2014-07-26 NOTE — Patient Instructions (Addendum)
Please remember to go to the x-ray department downstairs for your tests - we will call you with the results when they are available.  For drainage take chlortrimeton (chlorpheniramine) 4 mg every 4 hours available over the counter (may cause drowsiness)   Ok to try off singulair to see what difference if any this makes in your breathing, coughing or nasal symptoms   Please schedule a follow up visit in 3 months but call sooner if needed

## 2014-07-26 NOTE — Progress Notes (Signed)
Subjective:    Patient ID: Crystal Kerr, female    DOB: 1924/05/27   MRN: [id number]  Brief patient profile:  6 yowf quit smoking 1983 prev eval Dr Joya Gaskins dx with asthma  Referred back to pulmonary clinic 02/17/13 by Dr Rod Can with cough/sob since Mid dec 2014    History of Present Illness  02/17/2013 1st [facility] office visit/ Terica Yogi cc new sob/cough since [facility] Dec 2014 with white mucus worse day than night while on advair and better p albuterol for up to 6 h day = night s purulent sputum rec Symbicort 160 Take 2 puffs first thing in am and then another 2 puffs about 12 hours later.  Only use your albuterol (proair) as a rescue medication  Prednisone 10 mg take  4 each am x 2 days,   2 each am x 2 days,  1 each am x 2 days and stop  Try prilosec 20mg   Take 30-60 min before first meal of the day and Pepcid 20 mg one bedtime until cough is completely gone for at least a week without the need for cough suppression GERD diet     07/10/2013 f/u ov/Ravin Bendall re: worse cough x > one month but actually "coughing daily x years"/ not on pepcid at hs Chief Complaint  Patient presents with  . Acute Visit    Pt believes she has bronchitis.  c/o fatigue, dizziness, wheezing, prod cough with yellow mucous X3 days.   cough worse at hs ? Better p saba despite reporting still taking symbicort 160 2bid  No sob unless coughing rec Prednisone 10 mg take  4 each am x 2 days,  2 each am x 2 days,  1 each am x 2 days and stop  Pantoprazole (protonix) 40 mg  Take 30-60 min before first meal of the day and Pepcid 20 mg one at  bedtime and chlortrimeton 4 mg x 2 at bedtime For drainage take chlortrimeton (chlorpheniramine) 4 mg every 4 hours available over the counter (may cause drowsiness)   Best cough medication is delsym 2 tsp every 12 hours as needed GERD diet  12/04/2013 f/u ov/Ralpheal Zappone re: asthma/ symbicort 2 bid at 160 / singulair maint rx  Chief Complaint  Patient presents with  . Follow-up     Pt states that her cough is much better since the last visit. She feels that her breathing has improved back to baseline "as long as I don't o out on the cold". She is using rescue inhaler on average 3 x per wk now.   mild doe x dog walking but better than baseline  No noct cough/wheeze or need for saba noct, rare daytime need  rec Try symbicort 80 Take 2 puffs first thing in am and then another 2 puffs about 12 hours later - if works just as well then call us for an 80 Rx and when you finish your 160 supply we can chang  your over to the 80 dose long term    03/19/2014 f/u ov/Jameah Rouser re: dyspnea  s sign airflow osbt by pfts today p symbicort 80 in am/ on amiodarone chronically   Chief Complaint  Patient presents with  . Follow-up    Pt here for f/u PFT   Neuropathy is slowing her down still does HT leaning on cart ok  Going from kitchen to car exhausts her/ more difficult since lower dose symbicort Uses no more than one proair rx per day when over does it but  if inactive doesn't use at all  No longer interested in walking the dog  rec Increase symbicort to 160 Take 2 puffs first thing in am and then another 2 puffs about 12 hours later.  Work on inhaler technique:   Only use your albuterol as a rescue medication  Prednisone 10 mg take  4 each am x 2 days,   2 each am x 2 days,  1 each am x 2 days and stop    07/26/2014 f/u ov/Hayes Czaja re: asthma/ rhinitis/ on amio Chief Complaint  Patient presents with  . Follow-up    Pt states that her breathing is overall doing well. She c/o rhinitis and prod cough with clear sputum for the past month.  She is using albuterol inhaler approx 4 x per wk.   cough worse at hs attributes to pnds  / Not limited by breathing from desired activities     No obvious day to day or daytime variabilty or assoc excess or purulent sputum or   cp or chest tightness, subjective wheeze overt  hb symptoms. No unusual exp hx or h/o childhood pna/ asthma or knowledge of  premature birth.  Sleeping ok without nocturnal  or early am exacerbation  of respiratory  c/o's or need for noct saba. Also denies any obvious fluctuation of symptoms with weather or environmental changes or other aggravating or alleviating factors except as outlined above   Current Medications, Allergies, Complete Past Medical History, Past Surgical History, Family History, and Social History were reviewed in [employer] record.  ROS  The following are not active complaints unless bolded sore throat, dysphagia, dental problems, itching, sneezing,  nasal congestion or excess/ purulent secretions, ear ache,   fever, chills, sweats, unintended wt loss, pleuritic or exertional cp, hemoptysis,  orthopnea pnd or leg swelling, presyncope, palpitations, heartburn, abdominal pain, anorexia, nausea, vomiting, diarrhea  or change in bowel or urinary habits, change in stools or urine, dysuria,hematuria,  rash, arthralgias, visual complaints, headache, numbness weakness or ataxia or problems with walking or coordination,  change in mood/affect or memory.                Objective:  Physical Exam   amb wf nad/ mildly hoarse  08/07/2013          188 > 10/23/2013  191 > 12/04/2013 194 > 03/19/2014   197 > 07/26/14 182  Wt Readings from Last 3 Encounters:  07/10/13 185 lb (83.915 kg)  07/05/13 184 lb 6.4 oz (83.643 kg)  04/14/13 177 lb (80.287 kg)      HEENT: nl dentition, turbinates, and orophanx. Nl external ear canals without cough reflex   NECK :  without JVD/Nodes/TM/ nl carotid upstrokes bilaterally   LUNGS: no acc muscle use,  Clear to A and P    CV:  RRR  no s3 or murmur or increase in P2, trace bilateral lower ext edema feet only  ABD:  soft and nontender with nl excursion in the supine position. No bruits or organomegaly, bowel sounds nl  MS:  warm without deformities, calf tenderness, cyanosis or clubbing  SKIN: warm and dry without lesions        I  personally reviewed images and agree with radiology impression as follows:  CXR:   07/26/14 There is mild bilateral chronic interstitial thickening. There is no focal parenchymal opacity. There is no pleural effusion or pneumothorax. The heart and mediastinal contours are unremarkable. There is thoracic aortic atherosclerosis.  The osseous structures  are unremarkable.        Assessment & Plan:

## 2014-07-29 ENCOUNTER — Encounter: Payer: Self-pay | Admitting: Internal Medicine

## 2014-07-29 NOTE — Assessment & Plan Note (Signed)
12/04/2013 p extensive coaching HFA effectiveness =    75% so try symbicort 80 2bid - PFTs 03/20/14   FEV1  1.28 (85%) ratio 75 s resp to saba but took am symbicort and dlco 68 corrects to 103$ - 03/20/2014   try symbiocrt 160 2bid - 07/26/2014 p extensive coaching HFA effectiveness =    90% so try off singulair   All goals of chronic asthma control met including optimal function and elimination of symptoms with minimal need for rescue therapy.  Contingencies discussed in full including contacting this office immediately if not controlling the symptoms using the rule of two's.     I had an extended discussion with the patient reviewing all relevant studies completed to date and  lasting 15 to 20 minutes of a 25 minute visit    Each maintenance medication was reviewed in detail including most importantly the difference between maintenance and prns and under what circumstances the prns are to be triggered using an action plan format that is not reflected in the computer generated alphabetically organized AVS.    Please see instructions for details which were reviewed in writing and the patient given a copy highlighting the part that I personally wrote and discussed at today's ov.

## 2014-07-29 NOTE — Assessment & Plan Note (Signed)
The most common causes of chronic cough in immunocompetent adults include the following: upper airway cough syndrome (UACS), previously referred to as postnasal drip syndrome (PNDS), which is caused by variety of rhinosinus conditions; (2) asthma; (3) GERD; (4) chronic bronchitis from cigarette smoking or other inhaled environmental irritants; (5) nonasthmatic eosinophilic bronchitis; and (6) bronchiectasis.   These conditions, singly or in combination, have accounted for up to 94% of the causes of chronic cough in prospective studies.   Other conditions have constituted no >6% of the causes in prospective studies These have included bronchogenic carcinoma, chronic interstitial pneumonia, sarcoidosis, left ventricular failure, ACEI-induced cough, and aspiration from a condition associated with pharyngeal dysfunction.    Chronic cough is often simultaneously caused by more than one condition. A single cause has been found from 38 to 82% of the time, multiple causes from 18 to 62%. Multiply caused cough has been the result of three diseases up to 42% of the time.       Based on hx and exam, this is most likely:  Classic Upper airway cough syndrome, so named because it's frequently impossible to sort out how much is  CR/sinusitis with freq throat clearing (which can be related to primary GERD)   vs  causing  secondary (" extra esophageal")  GERD from wide swings in gastric pressure that occur with throat clearing, often  promoting self use of mint and menthol lozenges that reduce the lower esophageal sphincter tone and exacerbate the problem further in a cyclical fashion.   These are the same pts (now being labeled as having "irritable larynx syndrome" by some cough centers) who not infrequently have a history of having failed to tolerate ace inhibitors,  dry powder inhalers or biphosphonates or report having atypical reflux symptoms that don't respond to standard doses of PPI , and are easily confused as  having aecopd or asthma flares by even experienced allergists/ pulmonologists.   The first step is to maximize acid suppression and eliminate pnds with 1st gen H1 then regroup if the cough persists.  See instructions for specific recommendations which were reviewed directly with the patient who was given a copy with highlighter outlining the key components.

## 2014-08-20 ENCOUNTER — Other Ambulatory Visit: Payer: Self-pay | Admitting: *Deleted

## 2014-08-20 DIAGNOSIS — I4819 Other persistent atrial fibrillation: Secondary | ICD-10-CM

## 2014-08-20 MED ORDER — DILTIAZEM HCL ER COATED BEADS 120 MG PO CP24: 120.0000 mg | ORAL_CAPSULE | Freq: Every day | ORAL | Status: DC

## 2014-09-05 ENCOUNTER — Other Ambulatory Visit: Payer: Self-pay | Admitting: Family Medicine

## 2014-09-07 DIAGNOSIS — L600 Ingrowing nail: Secondary | ICD-10-CM | POA: Diagnosis not present

## 2014-09-07 DIAGNOSIS — L602 Onychogryphosis: Secondary | ICD-10-CM | POA: Diagnosis not present

## 2014-09-24 ENCOUNTER — Other Ambulatory Visit: Payer: Self-pay

## 2014-09-24 MED ORDER — APIXABAN 5 MG PO TABS: 5.0000 mg | ORAL_TABLET | Freq: Two times a day (BID) | ORAL | Status: DC

## 2014-09-24 NOTE — Telephone Encounter (Signed)
Refill sent for Eliquis

## 2014-09-25 ENCOUNTER — Encounter: Payer: Self-pay | Admitting: Cardiovascular Disease

## 2014-09-25 ENCOUNTER — Ambulatory Visit (INDEPENDENT_AMBULATORY_CARE_PROVIDER_SITE_OTHER): Payer: Medicare Other | Admitting: Cardiovascular Disease

## 2014-09-25 VITALS — BP 130/64 | HR 63 | Ht 68.0 in | Wt 179.6 lb

## 2014-09-25 DIAGNOSIS — I481 Persistent atrial fibrillation: Secondary | ICD-10-CM

## 2014-09-25 DIAGNOSIS — I493 Ventricular premature depolarization: Secondary | ICD-10-CM | POA: Diagnosis not present

## 2014-09-25 DIAGNOSIS — S80829A Blister (nonthermal), unspecified lower leg, initial encounter: Secondary | ICD-10-CM | POA: Insufficient documentation

## 2014-09-25 DIAGNOSIS — S80821A Blister (nonthermal), right lower leg, initial encounter: Secondary | ICD-10-CM | POA: Diagnosis not present

## 2014-09-25 DIAGNOSIS — I4819 Other persistent atrial fibrillation: Secondary | ICD-10-CM

## 2014-09-25 LAB — BASIC METABOLIC PANEL
BUN: 21 mg/dL (ref 6–23)
CALCIUM: 9.1 mg/dL (ref 8.4–10.5)
CHLORIDE: 103 meq/L (ref 96–112)
CO2: 31 mEq/L (ref 19–32)
CREATININE: 1.25 mg/dL — AB (ref 0.40–1.20)
GFR: 43 mL/min — ABNORMAL LOW (ref >60.00)
Glucose, Bld: 85 mg/dL (ref 70–99)
Potassium: 4 mEq/L (ref 3.5–5.1)
Sodium: 141 mEq/L (ref 135–145)

## 2014-09-25 LAB — HEPATIC FUNCTION PANEL
ALT: 27 U/L (ref 0–35)
AST: 27 U/L (ref 0–37)
Albumin: 3.7 g/dL (ref 3.5–5.2)
Alkaline Phosphatase: 78 U/L (ref 39–117)
BILIRUBIN DIRECT: 0.1 mg/dL (ref 0.0–0.3)
BILIRUBIN TOTAL: 0.5 mg/dL (ref 0.2–1.2)
TOTAL PROTEIN: 6.6 g/dL (ref 6.0–8.3)

## 2014-09-25 LAB — CBC
HEMATOCRIT: 37.2 % (ref 36.0–46.0)
HEMOGLOBIN: 12.3 g/dL (ref 12.0–15.0)
MCHC: 33 g/dL (ref 30.0–36.0)
MCV: 85.9 fl (ref 78.0–100.0)
PLATELETS: 234 10*3/uL (ref 150.0–400.0)
RBC: 4.33 Mil/uL (ref 3.87–5.11)
RDW: 16.6 % — AB (ref 11.5–15.5)
WBC: 9.8 10*3/uL (ref 4.0–10.5)

## 2014-09-25 NOTE — Patient Instructions (Signed)
Medication Instructions:  Your physician recommends that you continue on your current medications as directed. Please refer to the Current Medication list given to you today.  Labwork: Your physician recommends that you have lab work today: CBC, BMP and LIVER  Testing/Procedures: No new orders.  Follow-Up: Your physician wants you to follow-up in: 6 MONTHS with Dr Fletcher Anon. You will receive a reminder letter in the mail two months in advance. If you don't receive a letter, please call our office to schedule the follow-up appointment.   Any Other Special Instructions Will Be Listed Below (If Applicable).

## 2014-09-25 NOTE — Progress Notes (Signed)
HPI  This is an 81 year old female who is here today for a followup visit regarding persistent atrial fibrillation maintained in sinus rhythm with amiodarone. She was initially seen in 2014 for evaluation of dizziness and PVCs. She was noted to be orthostatic by physical exam. Echocardiogram showed normal LV systolic function without significant valvular abnormalities. Holter monitor confirmed excessive PVCs. 20% of her beats were actually PVCs. She was started on diltiazem extended release 120 mg once daily with significant improvement in her symptoms.  She was hospitalized at [facility] in February, 2015 for viral gastroenteritis. She was noted to be in atrial fibrillation with rapid ventricular response. She underwent TEE guided cardioversion and was placed on amiodarone. She has been maintaining sinus rhythm since then. The patient is here today due to the daughter's concern excessive bruising. Also the patient injured her right distal calf and formed initially a blister and then there was some bleeding with a scab formation. This seems to be healing slowly at the present time. Otherwise she reports improvement in dizziness.   No Known Allergies   Current Outpatient Prescriptions on File Prior to Visit  Medication Sig Dispense Refill  . acetaminophen (TYLENOL) 325 MG tablet Take 650 mg by mouth every 6 (six) hours as needed.    Marland Kitchen albuterol (PROAIR HFA) 108 (90 BASE) MCG/ACT inhaler Inhale 2 puffs into the lungs every 4 (four) hours as needed for wheezing or shortness of breath. 18 g 5  . amiodarone (PACERONE) 200 MG tablet TAKE 1 TABLET (200 MG TOTAL) BY MOUTH DAILY. 90 tablet 3  . apixaban (ELIQUIS) 5 MG TABS tablet Take 1 tablet (5 mg total) by mouth 2 (two) times daily. 60 tablet 2  . budesonide-formoterol (SYMBICORT) 160-4.5 MCG/ACT inhaler Take 2 puffs first thing in am and then another 2 puffs about 12 hours later. 1 Inhaler 11  . diltiazem (CARDIZEM CD) 120 MG 24 hr capsule Take 1  capsule (120 mg total) by mouth daily. TAKE ONE CAPSULE BY MOUTH EVERY DAY 30 capsule 3  . guaiFENesin (MUCINEX) 600 MG 12 hr tablet Take 1 tablet (600 mg total) by mouth 2 (two) times daily as needed. 14 tablet 1  . HYDROcodone-acetaminophen (NORCO) 5-325 MG per tablet One half tablet every 8 hours 20 tablet 0  . meclizine (ANTIVERT) 25 MG tablet TAKE 1 TABLET (25 MG TOTAL) BY MOUTH 3 (THREE) TIMES DAILY AS NEEDED FOR DIZZINESS. 30 tablet 1  . montelukast (SINGULAIR) 10 MG tablet TAKE 1 TABLET BY MOUTH ONCE DAILY 90 tablet 0  . Multiple Vitamins-Minerals (MULTIVITAMIN WITH MINERALS) tablet Take 1 tablet by mouth daily.    . pantoprazole (PROTONIX) 40 MG tablet Take 1 tablet (40 mg total) by mouth daily. Take 30-60 min before first meal of the day 30 tablet 11  . traMADol (ULTRAM) 50 MG tablet Take 1 tablet (50 mg total) by mouth every 8 (eight) hours as needed. 30 tablet 1   Current Facility-Administered Medications on File Prior to Visit  Medication Dose Route Frequency Provider Last Rate Last Dose  . ipratropium (ATROVENT) nebulizer solution 0.5 mg  0.5 mg Nebulization Once Harrison Mons, PA-C         Past Medical History  Diagnosis Date  . Asthma   . PVC's (premature ventricular contractions)   . Arthritis   . CHF (congestive heart failure)   . Atrial fibrillation      Past Surgical History  Procedure Laterality Date  . Appendectomy    . Cholecystectomy    .  Tonsillectomy    . Oophrectomy    . Tee without cardioversion N/A 03/08/2013    Procedure: TRANSESOPHAGEAL ECHOCARDIOGRAM (TEE);  Surgeon: Larey Dresser, MD;  Location: [facility];  Service: Cardiovascular;  Laterality: N/A;  . Cardioversion N/A 03/08/2013    Procedure: CARDIOVERSION;  Surgeon: Larey Dresser, MD;  Location[facility] ENDOSCOPY;  Service: Cardiovascular;  Laterality: N/A;     Family History  Problem Relation Age of Onset  . Heart disease      No family history  . Heart failure Mother   . Diabetes  Father      Social History   Social History  . Marital Status: Unknown    Spouse Name: N/A  . Number of Children: N/A  . Years of Education: N/A   Occupational History  . Retired    Social History Main Topics  . Smoking status: Former Smoker -- 1.00 packs/day for 20 years    Types: Cigarettes    Quit date: 04/13/1981  . Smokeless tobacco: Never Used  . Alcohol Use: Yes     Comment: Occasional  . Drug Use: No  . Sexual Activity: No   Other Topics Concern  . Not on file   Social History Narrative      PHYSICAL EXAM   BP 130/64 mmHg  Pulse 63  Ht 5\' 8"  (1.727 m)  Wt 179 lb 9.6 oz (81.466 kg)  BMI 27.31 kg/m2  Constitutional: She is oriented to person, place, and time. She appears well-developed and well-nourished. No distress.  HENT: No nasal discharge.  Head: Normocephalic and atraumatic.  Eyes: Pupils are equal and round. Right eye exhibits no discharge. Left eye exhibits no discharge.  Neck: Normal range of motion. Neck supple. No JVD present. No thyromegaly present.  Cardiovascular: Normal rate with occasional premature beats, regular rhythm, normal heart sounds. Exam reveals no gallop and no friction rub. No murmur heard.  Pulmonary/Chest: Effort normal and breath sounds normal. No stridor. No respiratory distress. She has no wheezes. She has no rales. She exhibits no tenderness.  Abdominal: Soft. Bowel sounds are normal. She exhibits no distension. There is no tenderness. There is no rebound and no guarding.  Musculoskeletal: Normal range of motion. She exhibits mild edema and no tenderness.  Neurological: She is alert and oriented to person, place, and time. Coordination normal.  Skin: Skin is warm and dry. No rash noted. She is not diaphoretic. No erythema. No pallor.  Psychiatric: She has a normal mood and affect. Her behavior is normal. Judgment and thought content normal.  There is a small scab on the lower right calf with no signs of infection.  EKG:  Normal sinus rhythm with no significant ST or T wave changes.  ASSESSMENT AND PLAN

## 2014-09-25 NOTE — Assessment & Plan Note (Signed)
No signs of cellulitis associated with this. This happened after a minor trauma and seems to be healing without intervention.

## 2014-09-25 NOTE — Assessment & Plan Note (Signed)
She is maintaining in sinus rhythm with amiodarone. I requested liver profile. She does have excessive bruising. However, the 5 mg dose is the correct dose for her. I explained this to the patient and her daughter. I am going to check her renal function just to ensure that her creatinine is still below 1.5.

## 2014-09-25 NOTE — Assessment & Plan Note (Signed)
These disappeared after treatment with diltiazem and amiodarone.

## 2014-09-27 ENCOUNTER — Other Ambulatory Visit: Payer: Self-pay | Admitting: Internal Medicine

## 2014-10-12 ENCOUNTER — Other Ambulatory Visit: Payer: Self-pay | Admitting: Internal Medicine

## 2014-10-23 ENCOUNTER — Other Ambulatory Visit: Payer: Self-pay | Admitting: Cardiovascular Disease

## 2014-10-23 DIAGNOSIS — I4819 Other persistent atrial fibrillation: Secondary | ICD-10-CM

## 2014-10-23 MED ORDER — DILTIAZEM HCL ER COATED BEADS 120 MG PO CP24: 120.0000 mg | ORAL_CAPSULE | Freq: Every day | ORAL | Status: DC

## 2014-11-07 ENCOUNTER — Encounter: Payer: Self-pay | Admitting: Internal Medicine

## 2014-11-07 ENCOUNTER — Ambulatory Visit (INDEPENDENT_AMBULATORY_CARE_PROVIDER_SITE_OTHER): Payer: Medicare Other | Admitting: Internal Medicine

## 2014-11-07 VITALS — BP 124/60 | HR 61 | Ht 63.0 in | Wt 178.6 lb

## 2014-11-07 DIAGNOSIS — R058 Other specified cough: Secondary | ICD-10-CM

## 2014-11-07 DIAGNOSIS — R05 Cough: Secondary | ICD-10-CM

## 2014-11-07 DIAGNOSIS — J45909 Unspecified asthma, uncomplicated: Secondary | ICD-10-CM | POA: Diagnosis not present

## 2014-11-07 DIAGNOSIS — Z23 Encounter for immunization: Secondary | ICD-10-CM

## 2014-11-07 NOTE — Progress Notes (Signed)
Subjective:    Patient ID: Crystal Kerr, female    DOB: 1924/05/27   MRN: [id number]  Brief patient profile:  6 yowf quit smoking 1983 prev eval Dr Joya Gaskins dx with asthma  Referred back to pulmonary clinic 02/17/13 by Dr Rod Can with cough/sob since Mid dec 2014    History of Present Illness  02/17/2013 1st [facility] office visit/ Crystal Kerr cc new sob/cough since [facility] Dec 2014 with white mucus worse day than night while on advair and better p albuterol for up to 6 h day = night s purulent sputum rec Symbicort 160 Take 2 puffs first thing in am and then another 2 puffs about 12 hours later.  Only use your albuterol (proair) as a rescue medication  Prednisone 10 mg take  4 each am x 2 days,   2 each am x 2 days,  1 each am x 2 days and stop  Try prilosec 20mg   Take 30-60 min before first meal of the day and Pepcid 20 mg one bedtime until cough is completely gone for at least a week without the need for cough suppression GERD diet     07/10/2013 f/u ov/Crystal Kerr re: worse cough x > one month but actually "coughing daily x years"/ not on pepcid at hs Chief Complaint  Patient presents with  . Acute Visit    Pt believes she has bronchitis.  c/o fatigue, dizziness, wheezing, prod cough with yellow mucous X3 days.   cough worse at hs ? Better p saba despite reporting still taking symbicort 160 2bid  No sob unless coughing rec Prednisone 10 mg take  4 each am x 2 days,  2 each am x 2 days,  1 each am x 2 days and stop  Pantoprazole (protonix) 40 mg  Take 30-60 min before first meal of the day and Pepcid 20 mg one at  bedtime and chlortrimeton 4 mg x 2 at bedtime For drainage take chlortrimeton (chlorpheniramine) 4 mg every 4 hours available over the counter (may cause drowsiness)   Best cough medication is delsym 2 tsp every 12 hours as needed GERD diet  12/04/2013 f/u ov/Crystal Kerr re: asthma/ symbicort 2 bid at 160 / singulair maint rx  Chief Complaint  Patient presents with  . Follow-up     Pt states that her cough is much better since the last visit. She feels that her breathing has improved back to baseline "as long as I don't o out on the cold". She is using rescue inhaler on average 3 x per wk now.   mild doe x dog walking but better than baseline  No noct cough/wheeze or need for saba noct, rare daytime need  rec Try symbicort 80 Take 2 puffs first thing in am and then another 2 puffs about 12 hours later - if works just as well then call us for an 80 Rx and when you finish your 160 supply we can chang  your over to the 80 dose long term    03/19/2014 f/u ov/Crystal Kerr re: dyspnea  s sign airflow osbt by pfts today p symbicort 80 in am/ on amiodarone chronically   Chief Complaint  Patient presents with  . Follow-up    Pt here for f/u PFT   Neuropathy is slowing her down still does HT leaning on cart ok  Going from kitchen to car exhausts her/ more difficult since lower dose symbicort Uses no more than one proair rx per day when over does it but  if inactive doesn't use at all  No longer interested in walking the dog  rec Increase symbicort to 160 Take 2 puffs first thing in am and then another 2 puffs about 12 hours later.  Work on inhaler technique:   Only use your albuterol as a rescue medication  Prednisone 10 mg take  4 each am x 2 days,   2 each am x 2 days,  1 each am x 2 days and stop    07/26/2014 f/u ov/Crystal Kerr re: asthma/ rhinitis/ on amio Chief Complaint  Patient presents with  . Follow-up    Pt states that her breathing is overall doing well. She c/o rhinitis and prod cough with clear sputum for the past month.  She is using albuterol inhaler approx 4 x per wk.   cough worse at hs attributes to pnds  / Not limited by breathing from desired activities   rec  For drainage take chlortrimeton (chlorpheniramine) 4 mg every 4 hours available over the counter (may cause drowsiness)  Ok to try off singulair to see what difference if any this makes in your breathing,  coughing or nasal symptoms     11/07/2014  f/u ov/Crystal Kerr re: asthma / final f/u  On symb 160 2bid with marginal hfa technique  Chief Complaint  Patient presents with  . Follow-up    Pt states her breathing is overall doing well. She does c/o cough with clear sputum.     Body mass index is 31.65 kg/(m^2).    avg h1 4 x a week . Same for saba. No longer having any nocturnal symptoms at all even if she doesn't take her not HS H1 Not limited by breathing from desired activities    No obvious day to day or daytime variabilty or assoc excess or purulent sputum or   cp or chest tightness, subjective wheeze overt  hb symptoms. No unusual exp hx or h/o childhood pna/ asthma or knowledge of premature birth.  Sleeping ok without nocturnal  or early am exacerbation  of respiratory  c/o's or need for noct saba. Also denies any obvious fluctuation of symptoms with weather or environmental changes or other aggravating or alleviating factors except as outlined above   Current Medications, Allergies, Complete Past Medical History, Past Surgical History, Family History, and Social History were reviewed in [employer] record.  ROS  The following are not active complaints unless bolded sore throat, dysphagia, dental problems, itching, sneezing,  nasal congestion or excess/ purulent secretions, ear ache,   fever, chills, sweats, unintended wt loss, pleuritic or exertional cp, hemoptysis,  orthopnea pnd or leg swelling, presyncope, palpitations, heartburn, abdominal pain, anorexia, nausea, vomiting, diarrhea  or change in bowel or urinary habits, change in stools or urine, dysuria,hematuria,  rash, arthralgias, visual complaints, headache, numbness weakness or ataxia or problems with walking or coordination,  change in mood/affect or memory.                Objective:  Physical Exam   amb wf nad/ mildly hoarse  08/07/2013          188 > 10/23/2013  191 > 12/04/2013 194 > 03/19/2014   197  > 07/26/14 182 > 11/07/2014    179  Wt Readings from Last 3 Encounters:  07/10/13 185 lb (83.915 kg)  07/05/13 184 lb 6.4 oz (83.643 kg)  04/14/13 177 lb (80.287 kg)      HEENT: nl dentition, turbinates, and orophanx. Nl external ear canals without cough  reflex   NECK :  without JVD/Nodes/TM/ nl carotid upstrokes bilaterally   LUNGS: no acc muscle use,  Clear to A and P    CV:  RRR  no s3 or murmur or increase in P2, trace bilateral lower ext edema feet only  ABD:  soft and nontender with nl excursion in the supine position. No bruits or organomegaly, bowel sounds nl  MS:  warm without deformities, calf tenderness, cyanosis or clubbing  SKIN: warm and dry without lesions        I personally reviewed images and agree with radiology impression as follows:  CXR:   07/26/14 There is mild bilateral chronic interstitial thickening. There is no focal parenchymal opacity. There is no pleural effusion or pneumothorax. The heart and mediastinal contours are unremarkable. There is thoracic aortic atherosclerosis.  The osseous structures are unremarkable.        Assessment & Plan:   Outpatient Encounter Prescriptions as of 11/07/2014  Medication Sig  . acetaminophen (TYLENOL) 325 MG tablet Take 650 mg by mouth every 6 (six) hours as needed.  Marland Kitchen albuterol (PROAIR HFA) 108 (90 BASE) MCG/ACT inhaler Inhale 2 puffs into the lungs every 4 (four) hours as needed for wheezing or shortness of breath.  Marland Kitchen amiodarone (PACERONE) 200 MG tablet TAKE 1 TABLET (200 MG TOTAL) BY MOUTH DAILY.  Marland Kitchen apixaban (ELIQUIS) 5 MG TABS tablet Take 1 tablet (5 mg total) by mouth 2 (two) times daily.  . budesonide-formoterol (SYMBICORT) 160-4.5 MCG/ACT inhaler Take 2 puffs first thing in am and then another 2 puffs about 12 hours later.  . diltiazem (CARDIZEM CD) 120 MG 24 hr capsule Take 1 capsule (120 mg total) by mouth daily. TAKE ONE CAPSULE BY MOUTH EVERY DAY  . famotidine (PEPCID) 20 MG tablet TAKE 1 TABLET  BY MOUTH AT BEDTIME  . guaiFENesin (MUCINEX) 600 MG 12 hr tablet Take 1 tablet (600 mg total) by mouth 2 (two) times daily as needed.  Marland Kitchen HYDROcodone-acetaminophen (NORCO) 5-325 MG per tablet One half tablet every 8 hours  . ipratropium (ATROVENT) 0.03 % nasal spray Place 2 sprays into both nostrils at bedtime.  . meclizine (ANTIVERT) 25 MG tablet TAKE 1 TABLET (25 MG TOTAL) BY MOUTH 3 (THREE) TIMES DAILY AS NEEDED FOR DIZZINESS.  . montelukast (SINGULAIR) 10 MG tablet TAKE 1 TABLET BY MOUTH ONCE DAILY  . Multiple Vitamins-Minerals (MULTIVITAMIN WITH MINERALS) tablet Take 1 tablet by mouth daily.  . pantoprazole (PROTONIX) 40 MG tablet TAKE 1 TABLET (40 MG TOTAL) BY MOUTH DAILY. TAKE 30-60 MIN BEFORE FIRST MEAL OF THE DAY  . traMADol (ULTRAM) 50 MG tablet Take 1 tablet (50 mg total) by mouth every 8 (eight) hours as needed.  . [DISCONTINUED] famotidine (PEPCID) 20 MG tablet Take 20 mg by mouth at bedtime.   Facility-Administered Encounter Medications as of 11/07/2014  Medication  . ipratropium (ATROVENT) nebulizer solution 0.5 mg

## 2014-11-07 NOTE — Patient Instructions (Signed)
Work on inhaler technique:  relax and gently blow all the way out then take a nice smooth deep breath back in, triggering the inhaler at same time you start breathing in.  Hold for up to 5 seconds if you can. Blow out thru nose. Rinse and gargle with water when done  [facility] to try off singulair to see what difference if any this makes  If you are satisfied with your treatment plan,  let your doctor know and  she can either refill your medications or you can return here when your prescription runs out.     If in any way you are not 100% satisfied,  please tell us.  If 100% better, tell your friends!  Pulmonary follow up is as needed

## 2014-11-08 ENCOUNTER — Encounter: Payer: Self-pay | Admitting: Internal Medicine

## 2014-11-08 NOTE — Assessment & Plan Note (Signed)
12/04/2013 p extensive coaching HFA effectiveness =    75% so try symbicort 80 2bid - PFTs 03/20/14   FEV1  1.28 (85%) ratio 75 s resp to saba but took am symbicort and dlco 68 corrects to 103$ - 03/20/2014   try symbiocrt 160 2bid   The proper method of use, as well as anticipated side effects, of a metered-dose inhaler are discussed and demonstrated to the patient. Improved effectiveness after extensive coaching during this visit to a level of approximately      90% from a baseline of < 50%    In retrospect she seemed do the past after we added the first generation H1 strongly suggesting this was more of an upper airway cough syndrome the true asthma. Note that her baseline HFA technique was not that good and yet she has excellent control of asthma probably could try off of Singulair now just to simplify her regimen a bit. I did warn her that it takes about a week to see the difference off Singulair and to start back immediately if she notices any flare of rhinitis or asthma symptoms  I had an extended summary final  discussion with the patient reviewing all relevant studies completed to date and  lasting 15 to 20 minutes of a 25 minute visit    Each maintenance medication was reviewed in detail including most importantly the difference between maintenance and prns and under what circumstances the prns are to be triggered using an action plan format that is not reflected in the computer generated alphabetically organized AVS.    Please see instructions for details which were reviewed in writing and the patient given a copy highlighting the part that I personally wrote and discussed at today's ov.

## 2014-11-08 NOTE — Assessment & Plan Note (Signed)
-   Try more aggressive rx with 1st gen H1  07/26/14 > much better so try off singulair

## 2014-11-26 ENCOUNTER — Other Ambulatory Visit: Payer: Self-pay | Admitting: *Deleted

## 2014-11-26 DIAGNOSIS — M13811 Other specified arthritis, right shoulder: Secondary | ICD-10-CM | POA: Diagnosis not present

## 2014-11-26 MED ORDER — PANTOPRAZOLE SODIUM 40 MG PO TBEC: DELAYED_RELEASE_TABLET | ORAL | Status: DC

## 2014-12-04 DIAGNOSIS — M13811 Other specified arthritis, right shoulder: Secondary | ICD-10-CM | POA: Diagnosis not present

## 2014-12-09 ENCOUNTER — Other Ambulatory Visit: Payer: Self-pay | Admitting: Physician Assistant

## 2014-12-11 NOTE — Telephone Encounter (Signed)
LM   Is she still taking singular per note pulmonology was going to go off.

## 2014-12-24 ENCOUNTER — Telehealth: Payer: Self-pay | Admitting: Internal Medicine

## 2014-12-24 NOTE — Telephone Encounter (Signed)
PA initiated for Symbicort 160 through [facility]: [id number] Rx# [id number] [place] - [phone] Decision should be made within 3 days Awaiting decision

## 2014-12-24 NOTE — Telephone Encounter (Signed)
Called and spoke with [facility] [employer] - LM for Lattie Haw to return our call regarding this patient's medication.  Member # [id number]

## 2014-12-25 NOTE — Telephone Encounter (Signed)
Spoke with Olin Hauser at [facility] - she needed to verify dose of Symbicort. Gave information to [place]. She said that is all they needed and they should have a decision about the PA in a few hours. Awaiting decision.

## 2014-12-26 NOTE — Telephone Encounter (Signed)
This is being cancelled due to being in the formulary  Per [employer] Elberta Fortis    [phone]

## 2015-01-06 ENCOUNTER — Other Ambulatory Visit: Payer: Self-pay | Admitting: Internal Medicine

## 2015-01-09 NOTE — Telephone Encounter (Signed)
Called and spoke with Colletta Maryland at [facility] to receive determination  Colletta Maryland stated that she spoke with Chantel and gave her the decision as well as mailed it in a letter to the provider. She said that the Symbicort does not need approval. Nothing further needed.

## 2015-02-01 ENCOUNTER — Encounter: Payer: Self-pay | Admitting: Family Medicine

## 2015-02-01 ENCOUNTER — Other Ambulatory Visit: Payer: Self-pay | Admitting: Internal Medicine

## 2015-02-06 ENCOUNTER — Encounter: Payer: Self-pay | Admitting: Family Medicine

## 2015-02-07 ENCOUNTER — Other Ambulatory Visit: Payer: Self-pay | Admitting: Family Medicine

## 2015-02-07 NOTE — Telephone Encounter (Signed)
Pt daughter called stating that the request of medication has turned care over to dr copland not dr wert any longer

## 2015-02-09 ENCOUNTER — Ambulatory Visit (HOSPITAL_COMMUNITY): Payer: Self-pay

## 2015-02-09 ENCOUNTER — Ambulatory Visit (INDEPENDENT_AMBULATORY_CARE_PROVIDER_SITE_OTHER): Payer: Medicare Other

## 2015-02-09 ENCOUNTER — Ambulatory Visit (INDEPENDENT_AMBULATORY_CARE_PROVIDER_SITE_OTHER): Payer: Medicare Other | Admitting: Family Medicine

## 2015-02-09 VITALS — BP 114/68 | HR 66 | Temp 97.6°F | Resp 17 | Ht 62.0 in | Wt 172.0 lb

## 2015-02-09 DIAGNOSIS — R0602 Shortness of breath: Secondary | ICD-10-CM | POA: Diagnosis not present

## 2015-02-09 DIAGNOSIS — K219 Gastro-esophageal reflux disease without esophagitis: Secondary | ICD-10-CM | POA: Diagnosis not present

## 2015-02-09 DIAGNOSIS — J4531 Mild persistent asthma with (acute) exacerbation: Secondary | ICD-10-CM

## 2015-02-09 DIAGNOSIS — I5032 Chronic diastolic (congestive) heart failure: Secondary | ICD-10-CM | POA: Diagnosis not present

## 2015-02-09 DIAGNOSIS — I4891 Unspecified atrial fibrillation: Secondary | ICD-10-CM

## 2015-02-09 DIAGNOSIS — J0101 Acute recurrent maxillary sinusitis: Secondary | ICD-10-CM | POA: Diagnosis not present

## 2015-02-09 DIAGNOSIS — R05 Cough: Secondary | ICD-10-CM | POA: Diagnosis not present

## 2015-02-09 LAB — POCT CBC
Granulocyte percent: 74.4 %G (ref 37–80)
HCT, POC: 37.3 % — AB (ref 37.7–47.9)
Hemoglobin: 12.8 g/dL (ref 12.2–16.2)
Lymph, poc: 2 (ref 0.6–3.4)
MCH, POC: 28.7 pg (ref 27–31.2)
MCHC: 34.4 g/dL (ref 31.8–35.4)
MCV: 83.5 fL (ref 80–97)
MID (CBC): 0.7 (ref 0–0.9)
MPV: 7.6 fL (ref 0–99.8)
PLATELET COUNT, POC: 340 10*3/uL (ref 142–424)
POC Granulocyte: 8 — AB (ref 2–6.9)
POC LYMPH %: 18.6 % (ref 10–50)
POC MID %: 7 %M (ref 0–12)
RBC: 4.47 M/uL (ref 4.04–5.48)
RDW, POC: 15.4 %
WBC: 10.7 10*3/uL — AB (ref 4.6–10.2)

## 2015-02-09 MED ORDER — MONTELUKAST SODIUM 10 MG PO TABS: 10.0000 mg | ORAL_TABLET | Freq: Every day | ORAL | Status: DC

## 2015-02-09 MED ORDER — LEVALBUTEROL HCL 0.63 MG/3ML IN NEBU
0.6300 mg | INHALATION_SOLUTION | Freq: Once | RESPIRATORY_TRACT | Status: AC
  Administered 2015-02-09: 0.63 mg via RESPIRATORY_TRACT

## 2015-02-09 MED ORDER — PREDNISONE 20 MG PO TABS: ORAL_TABLET | ORAL | Status: DC

## 2015-02-09 MED ORDER — MUCINEX DM 30-600 MG PO TB12: 1.0000 | ORAL_TABLET | Freq: Two times a day (BID) | ORAL | Status: DC

## 2015-02-09 MED ORDER — AMOXICILLIN-POT CLAVULANATE 875-125 MG PO TABS: 1.0000 | ORAL_TABLET | Freq: Two times a day (BID) | ORAL | Status: DC

## 2015-02-09 MED ORDER — FAMOTIDINE 20 MG PO TABS: 20.0000 mg | ORAL_TABLET | Freq: Every day | ORAL | Status: DC

## 2015-02-09 NOTE — Patient Instructions (Signed)
1.  START TAKING ALBUTEROL FOUR TIMES DAILY FOR THE NEXT WEEK.  Asthma, Acute Bronchospasm Acute bronchospasm caused by asthma is also referred to as an asthma attack. Bronchospasm means your air passages become narrowed. The narrowing is caused by inflammation and tightening of the muscles in the air tubes (bronchi) in your lungs. This can make it hard to breathe or cause you to wheeze and cough. CAUSES Possible triggers are:  Animal dander from the skin, hair, or feathers of animals.  Dust mites contained in house dust.  Cockroaches.  Pollen from trees or grass.  Mold.  Cigarette or tobacco smoke.  Air pollutants such as dust, household cleaners, hair sprays, aerosol sprays, paint fumes, strong chemicals, or strong odors.  Cold air or weather changes. Cold air may trigger inflammation. Winds increase molds and pollens in the air.  Strong emotions such as crying or laughing hard.  Stress.  Certain medicines such as aspirin or beta-blockers.  Sulfites in foods and drinks, such as dried fruits and wine.  Infections or inflammatory conditions, such as a flu, cold, or inflammation of the nasal membranes (rhinitis).  Gastroesophageal reflux disease (GERD). GERD is a condition where stomach acid backs up into your esophagus.  Exercise or strenuous activity. SIGNS AND SYMPTOMS   Wheezing.  Excessive coughing, particularly at night.  Chest tightness.  Shortness of breath. DIAGNOSIS  Your health care provider will ask you about your medical history and perform a physical exam. A chest X-ray or blood testing may be performed to look for other causes of your symptoms or other conditions that may have triggered your asthma attack. TREATMENT  Treatment is aimed at reducing inflammation and opening up the airways in your lungs. Most asthma attacks are treated with inhaled medicines. These include quick relief or rescue medicines (such as bronchodilators) and controller medicines  (such as inhaled corticosteroids). These medicines are sometimes given through an inhaler or a nebulizer. Systemic steroid medicine taken by mouth or given through an IV tube also can be used to reduce the inflammation when an attack is moderate or severe. Antibiotic medicines are only used if a bacterial infection is present.  HOME CARE INSTRUCTIONS   Rest.  Drink plenty of liquids. This helps the mucus to remain thin and be easily coughed up. Only use caffeine in moderation and do not use alcohol until you have recovered from your illness.  Do not smoke. Avoid being exposed to secondhand smoke.  You play a critical role in keeping yourself in good health. Avoid exposure to things that cause you to wheeze or to have breathing problems.  Keep your medicines up-to-date and available. Carefully follow your health care provider's treatment plan.  Take your medicine exactly as prescribed.  When pollen or pollution is bad, keep windows closed and use an air conditioner or go to places with air conditioning.  Asthma requires careful medical care. See your health care provider for a follow-up as advised. If you are more than [redacted] weeks pregnant and you were prescribed any new medicines, let your obstetrician know about the visit and how you are doing. Follow up with your health care provider as directed.  After you have recovered from your asthma attack, make an appointment with your outpatient doctor to talk about ways to reduce the likelihood of future attacks. If you do not have a doctor who manages your asthma, make an appointment with a primary care doctor to discuss your asthma. SEEK IMMEDIATE MEDICAL CARE IF:   You  are getting worse.  You have trouble breathing. If severe, call your local emergency services (911 in the [place]  You develop chest pain or discomfort.  You are vomiting.  You are not able to keep fluids down.  You are coughing up yellow, green, brown, or bloody  sputum.  You have a fever and your symptoms suddenly get worse.  You have trouble swallowing. MAKE SURE YOU:   Understand these instructions.  Will watch your condition.  Will get help right away if you are not doing well or get worse.   This information is not intended to replace advice given to you by your health care provider. Make sure you discuss any questions you have with your health care provider.   Document Released: 04/15/2006 Document Revised: 01/03/2013 Document Reviewed: 07/06/2012 Elsevier Interactive Patient Education [employer].

## 2015-02-09 NOTE — Discharge Instructions (Signed)
Because you received an x-ray today, you will receive an invoice from [facility]. Please contact [facility] at [phone] with questions or concerns regarding your invoice. Our billing staff will not be able to assist you with those questions. °

## 2015-02-09 NOTE — Progress Notes (Signed)
Subjective:    Patient ID: Crystal Kerr, female    DOB: [birth year], 82 y.o.   MRN: [id number]  02/09/2015  Cough and Shortness of Breath   HPI This 82 y.o. female presents for evaluation of cough and shortness of breath.  Known atrial fibrillation with CHF diastolic/systolic and asthma.  Hoaresness.  Sudden onset; getting more congested.  Onset two weeks ago.  No fever/chills/sweats.  No headache; no sinus pressure.  No ear pain or sore throat.  +rhinorrhea and nasal congestion clear.  No sneezing.  +hoarseness.  +coughing; taking chlortramotrol. With temporary relief.  Coughing is worse at night; heavy cough; noisy cough.  Intermittent sputum clear.  Intermittent wheezing; using inhaler with wheezing.  Using inhaler once dialy.  Stopped Singulair two weeks prior to onset of symptoms.  Discharged from Dr. Melvyn Novas; he recommended discontinuing Singulair.  Started when on a cruise.  Symptoms did imrpove when getting off of cruise.  DOE for years; at baseline; mildly worse perhaps.  No v/d.  Feels fatigued.   Coughing a lot which takes a lot of energy.  No chest pain; swelling chronic and at baseline.  Has been trying to lose weight; taking Adkins protein shake at bedtime.  Cough drops only for cough OTC.    Also requesting refill of Pepcid.   Review of Systems  Constitutional: Positive for fatigue. Negative for fever, chills and diaphoresis.  HENT: Positive for congestion, postnasal drip, rhinorrhea, trouble swallowing and voice change. Negative for ear pain, sinus pressure, sore throat and tinnitus.   Eyes: Negative for visual disturbance.  Respiratory: Positive for cough, shortness of breath and wheezing.   Cardiovascular: Negative for chest pain, palpitations and leg swelling.  Gastrointestinal: Negative for nausea, vomiting, abdominal pain, diarrhea and constipation.  Endocrine: Negative for cold intolerance, heat intolerance, polydipsia, polyphagia and polyuria.  Skin: Negative for  rash.  Neurological: Negative for dizziness, tremors, seizures, syncope, facial asymmetry, speech difficulty, weakness, light-headedness, numbness and headaches.    Past Medical History  Diagnosis Date  . Asthma   . PVC's (premature ventricular contractions)   . Arthritis   . CHF (congestive heart failure) ([place])   . Atrial fibrillation [facility])    Past Surgical History  Procedure Laterality Date  . Appendectomy    . Cholecystectomy    . Tonsillectomy    . Oophrectomy    . Tee without cardioversion N/A 03/08/2013    Procedure: TRANSESOPHAGEAL ECHOCARDIOGRAM (TEE);  Surgeon: Larey Dresser, MD;  Location: [place];  Service: Cardiovascular;  Laterality: N/A;  . Cardioversion N/A 03/08/2013    Procedure: CARDIOVERSION;  Surgeon: Larey Dresser, MD;  Location: [place];  Service: Cardiovascular;  Laterality: N/A;   No Known Allergies  Social History   Social History  . Marital Status: Unknown    Spouse Name: N/A  . Number of Children: N/A  . Years of Education: N/A   Occupational History  . Retired    Social History Main Topics  . Smoking status: Former Smoker -- 1.00 packs/day for 20 years    Types: Cigarettes    Quit date: 04/13/1981  . Smokeless tobacco: Never Used  . Alcohol Use: Yes     Comment: Occasional  . Drug Use: No  . Sexual Activity: No   Other Topics Concern  . Not on file   Social History Narrative   Family History  Problem Relation Age of Onset  . Heart disease      No family history  . Heart  failure Mother   . Diabetes Father        Objective:    BP 114/68 mmHg  Pulse 66  Temp(Src) 97.6 F (36.4 C) (Oral)  Resp 17  Ht 5\' 2"  (1.575 m)  Wt 172 lb (78.019 kg)  BMI 31.45 kg/m2  SpO2 97%  PF 200 L/min Physical Exam  Constitutional: She is oriented to person, place, and time. She appears well-developed and well-nourished. No distress.  HENT:  Head: Normocephalic and atraumatic.  Right Ear: Tympanic membrane, external ear and ear  canal normal.  Left Ear: Tympanic membrane, external ear and ear canal normal.  Nose: Nose normal. No mucosal edema or rhinorrhea. Right sinus exhibits no maxillary sinus tenderness and no frontal sinus tenderness. Left sinus exhibits no maxillary sinus tenderness and no frontal sinus tenderness.  Mouth/Throat: Oropharynx is clear and moist.  Eyes: Conjunctivae and EOM are normal. Pupils are equal, round, and reactive to light.  Neck: Normal range of motion. Neck supple. Carotid bruit is not present. No thyromegaly present.  Cardiovascular: Normal rate, regular rhythm, normal heart sounds and intact distal pulses.  Exam reveals no gallop and no friction rub.   No murmur heard. Trace pitting edema lower legs B.  Pulmonary/Chest: Effort normal. She has no decreased breath sounds. She has wheezes in the right upper field, the right middle field, the right lower field, the left upper field, the left middle field and the left lower field. She has no rhonchi. She has no rales.  Lymphadenopathy:    She has no cervical adenopathy.  Neurological: She is alert and oriented to person, place, and time. No cranial nerve deficit.  Skin: Skin is warm and dry. No rash noted. She is not diaphoretic. No erythema. No pallor.  Psychiatric: She has a normal mood and affect. Her behavior is normal.   Results for orders placed or performed in visit on 02/09/15  POCT CBC  Result Value Ref Range   WBC 10.7 (A) 4.6 - 10.2 K/uL   Lymph, poc 2.0 0.6 - 3.4   POC LYMPH PERCENT 18.6 10 - 50 %L   MID (cbc) 0.7 0 - 0.9   POC MID % 7.0 0 - 12 %M   POC Granulocyte 8.0 (A) 2 - 6.9   Granulocyte percent 74.4 37 - 80 %G   RBC 4.47 4.04 - 5.48 M/uL   Hemoglobin 12.8 12.2 - 16.2 g/dL   HCT, POC 37.3 (A) 37.7 - 47.9 %   MCV 83.5 80 - 97 fL   MCH, POC 28.7 27 - 31.2 pg   MCHC 34.4 31.8 - 35.4 g/dL   RDW, POC 15.4 %   Platelet Count, POC 340 142 - 424 K/uL   MPV 7.6 0 - 99.8 fL      XOPENEX 0.63MG  NEBULIZER  ADMINISTERED.  Dg Chest 2 View  02/09/2015  CLINICAL DATA:  S shortness of breath, cough, asthma. EXAM: CHEST  2 VIEW COMPARISON:  07/26/2014 FINDINGS: There is hyperinflation of the lungs compatible with COPD. Heart and mediastinal contours are within normal limits. No focal opacities or effusions. No acute bony abnormality. IMPRESSION: COPD.  No active disease. Electronically Signed   By: Rolm Baptise M.D.   On: 02/09/2015 10:37   Assessment & Plan:   1. Asthma with acute exacerbation, mild persistent   2. Atrial fibrillation with RVR ([place])   3. Chronic diastolic congestive heart failure ([place])   4. Gastroesophageal reflux disease without esophagitis     1. Asthma exacerbation:  New.  S/p Xopenex nebulizer in office.  Rx for Prednisone and Augmentin and Mucinex DM prescribed; increase Albuterol to qid scheduled for one week and then PRN.  Restart Singulair.  1. Acute sinusitis/lower respiratory infection: New.  Rx for Augmentin provided due to age, CHF, asthma.  2.  Atrial fibrillation with RVR: stable; rate controlled; continue Eliquis. 3.  Chronic diastolic heart failure: Controlled; no evidence of volume overload today; no cardiac symptoms. 4.  GERD: controlled; refill of Pepcid provided.    Orders Placed This Encounter  Procedures  . DG Chest 2 View    Standing Status: Future     Number of Occurrences: 1     Standing Expiration Date: 02/09/2016    Order Specific Question:  Reason for Exam (SYMPTOM  OR DIAGNOSIS REQUIRED)    Answer:  cough, wheezing; atrial fib, CHF, asthma    Order Specific Question:  Preferred imaging location?    Answer:  External  . POCT CBC   Meds ordered this encounter  Medications  . levalbuterol (XOPENEX) nebulizer solution 0.63 mg    Sig:   . predniSONE (DELTASONE) 20 MG tablet    Sig: Three tablets daily x 2 days then two tablets daily x 5 days then one tablet daily x 5 days    Dispense:  21 tablet    Refill:  0  . amoxicillin-clavulanate  (AUGMENTIN) 875-125 MG tablet    Sig: Take 1 tablet by mouth 2 (two) times daily.    Dispense:  20 tablet    Refill:  0  . famotidine (PEPCID) 20 MG tablet    Sig: Take 1 tablet (20 mg total) by mouth at bedtime.    Dispense:  30 tablet    Refill:  11  . montelukast (SINGULAIR) 10 MG tablet    Sig: Take 1 tablet (10 mg total) by mouth daily.    Dispense:  30 tablet    Refill:  11  . Dextromethorphan-Guaifenesin (MUCINEX DM) 30-600 MG TB12    Sig: Take 1 tablet by mouth 2 (two) times daily.    Dispense:  30 each    Refill:  0    No Follow-up on file.    Jedaiah Rathbun Elayne Guerin, M.D. [facility] [address] [place], [place]  [address] [phone] phone [phone] fax

## 2015-02-25 ENCOUNTER — Other Ambulatory Visit: Payer: Self-pay | Admitting: Cardiovascular Disease

## 2015-03-06 IMAGING — CR DG CHEST 2V
2 series · 2 of 2 positions shown · non-contrast
Comparison: 06/30/2012

CLINICAL DATA: Shortness of breath

EXAM:
CHEST  2 VIEW

[PA]
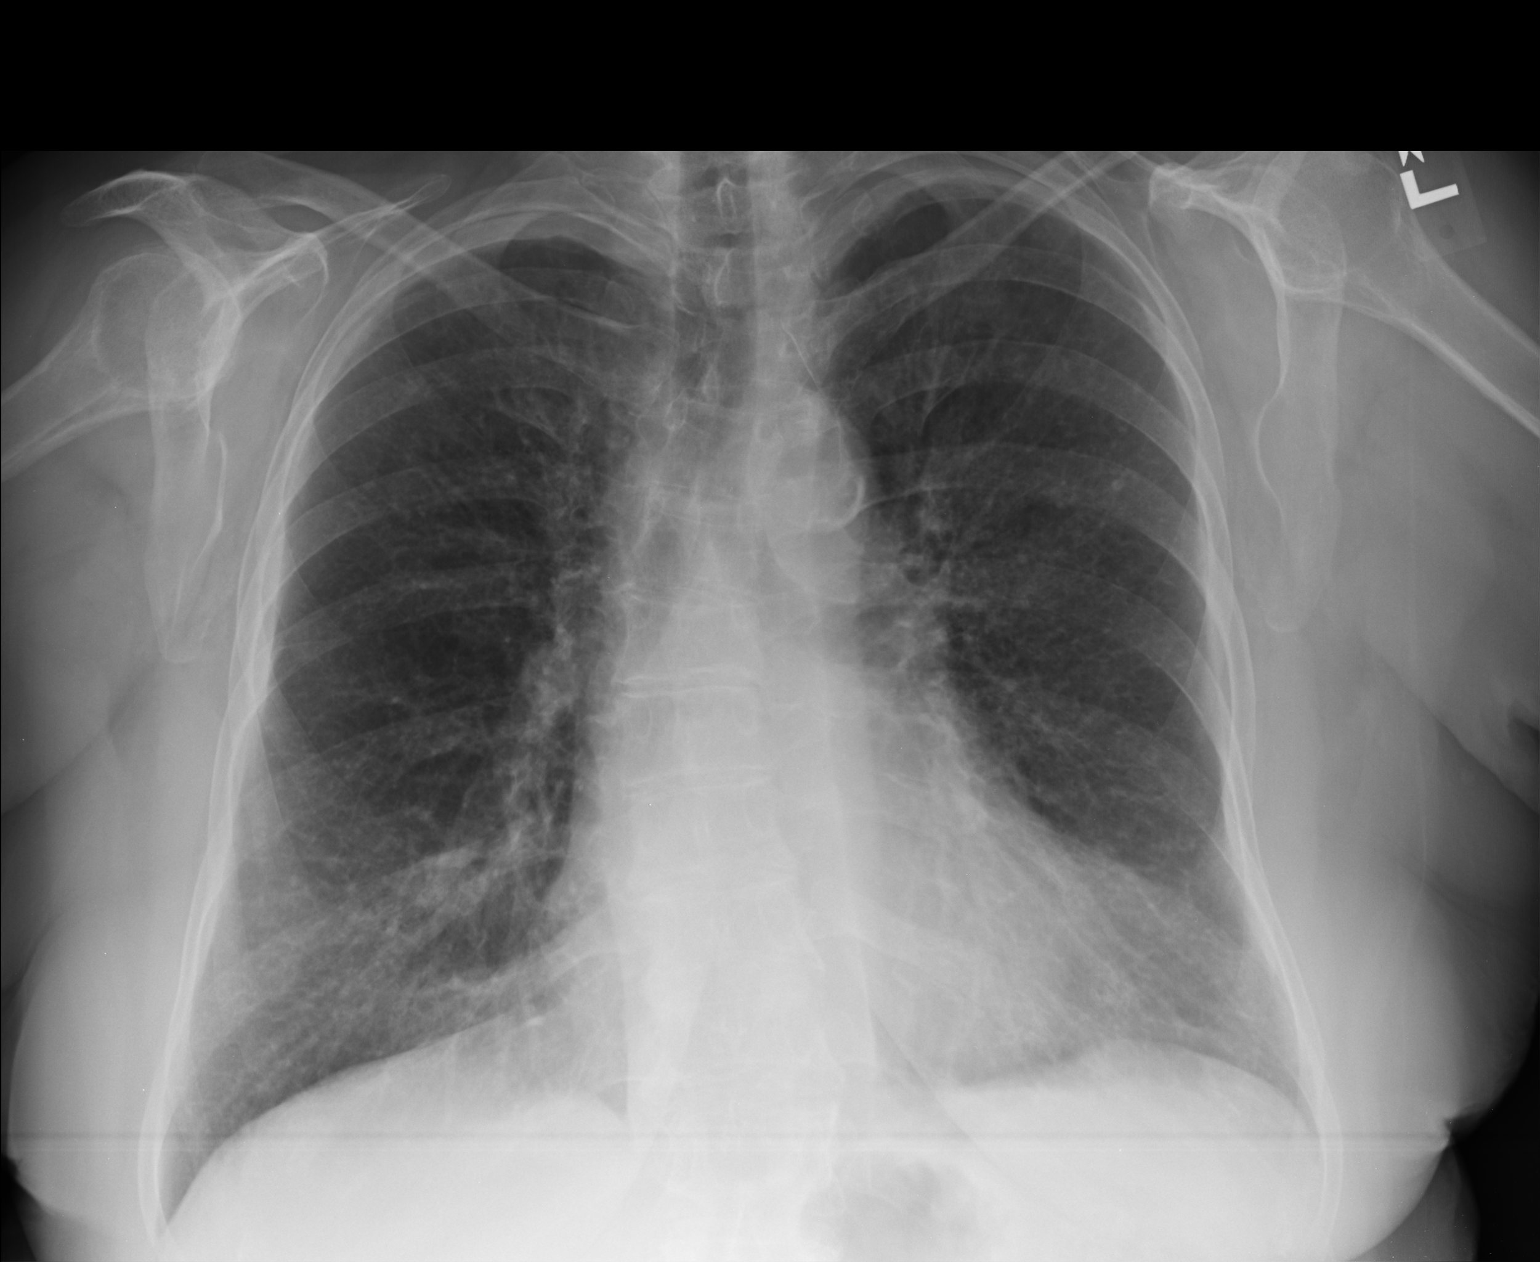

[lateral]
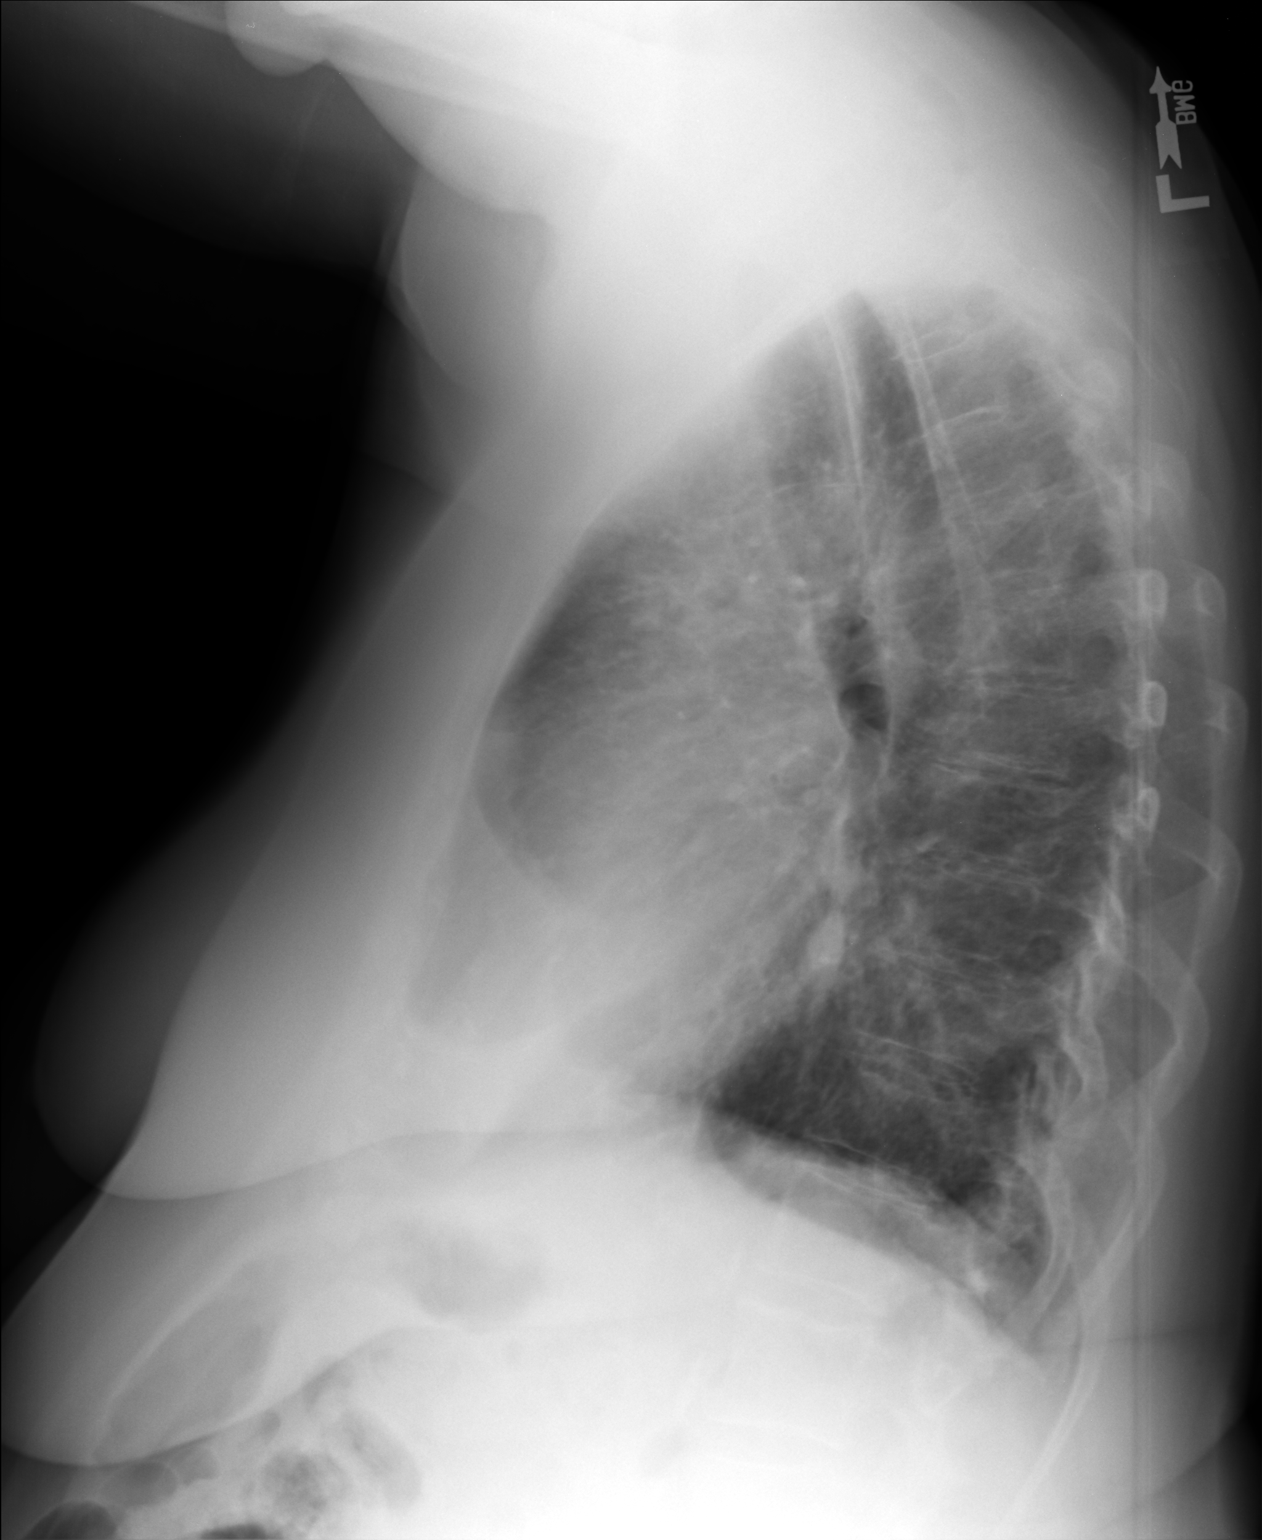

[2 of 2 positions shown; findings below may reference images not displayed]

FINDINGS: Chronically prominent interstitial markings and hyperinflation
likely indicate emphysema, unchanged. No new focal pulmonary
opacity. No pleural effusion. No acute osseous finding. Mild
cardiomegaly again noted.
IMPRESSION: No acute focal finding.  Emphysematous changes are reidentified.

## 2015-03-29 ENCOUNTER — Other Ambulatory Visit: Payer: Self-pay | Admitting: Cardiovascular Disease

## 2015-03-29 NOTE — Telephone Encounter (Signed)
Please review for refill. Thanks!  

## 2015-04-03 IMAGING — CR DG CHEST 1V PORT
1 series · 1 of 1 positions shown · non-contrast
Comparison: 02/08/2013

CLINICAL DATA: Atrial fibrillation. Tachycardia with weakness and
shortness of breath.

EXAM:
PORTABLE CHEST - 1 VIEW

[AP]
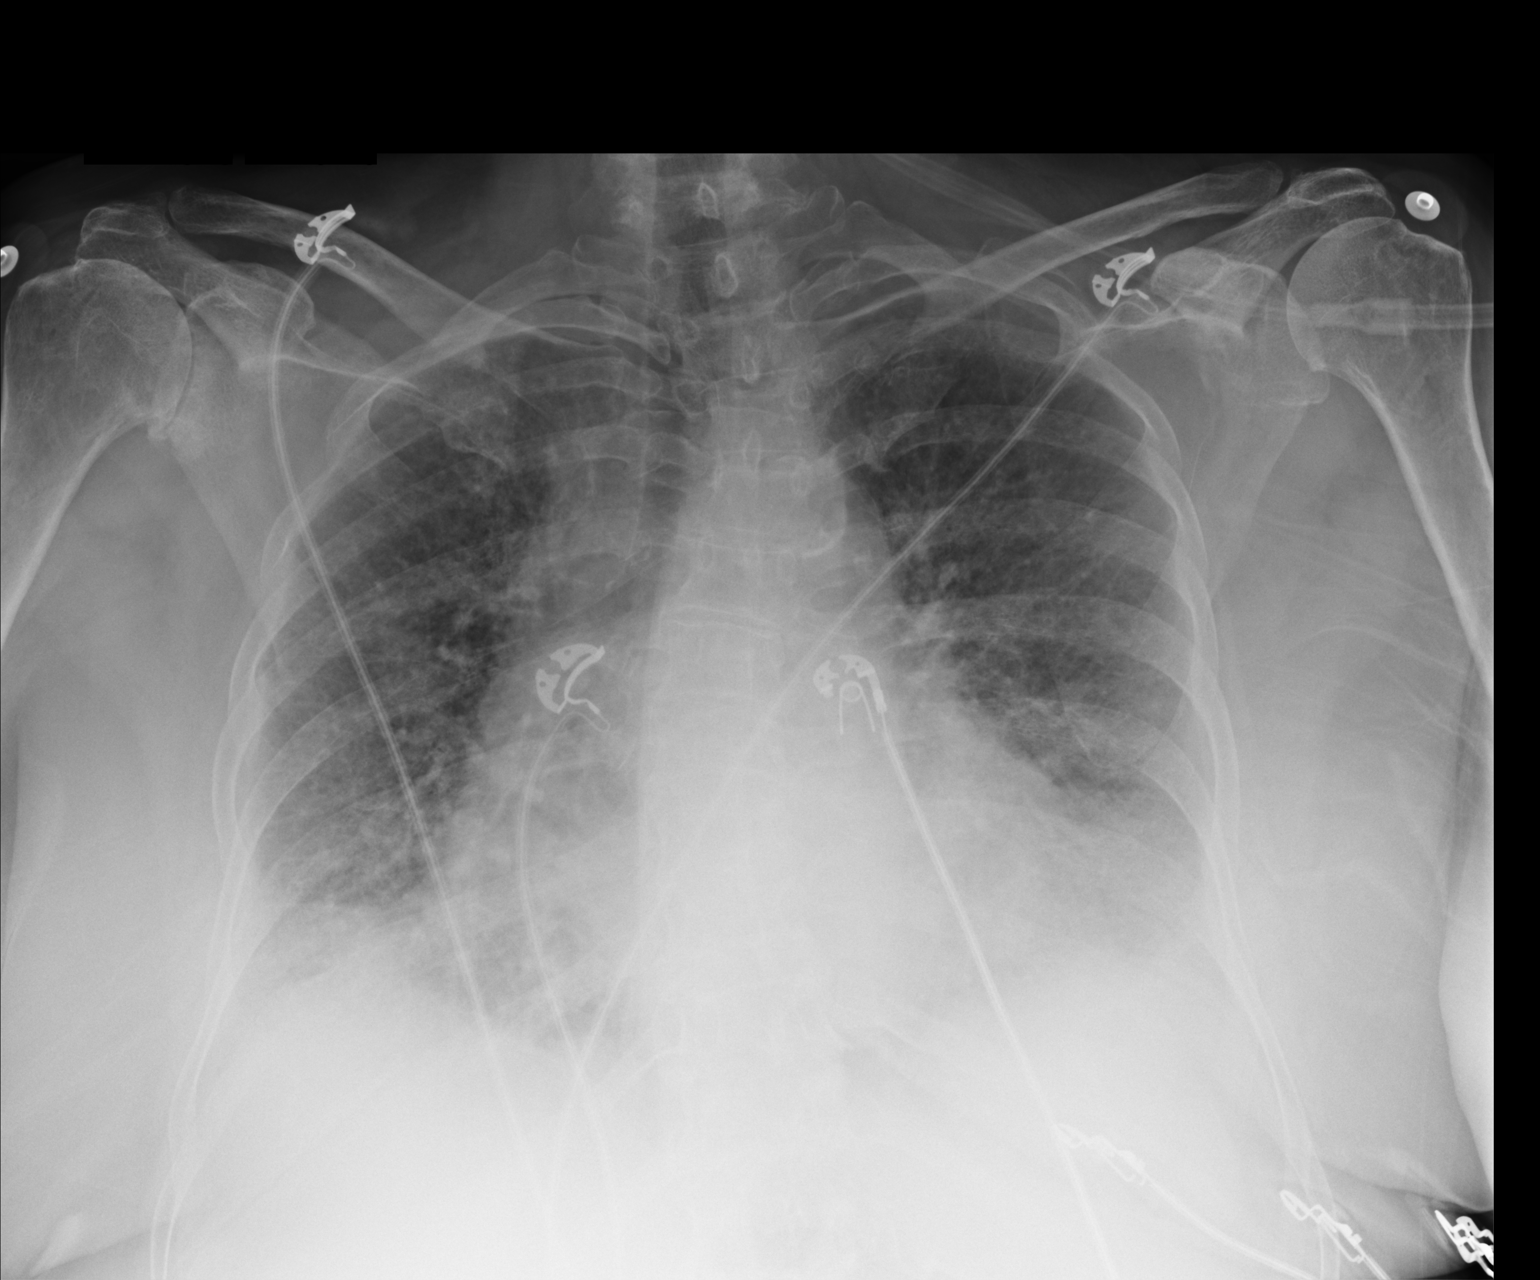

[1 of 1 positions shown; findings below may reference images not displayed]

FINDINGS: The cardiac silhouette is enlarged. Thoracic aortic calcification is
noted. There is new pulmonary vascular congestion with mild
bilateral interstitial opacity. Additional opacities are present in
both lung bases. Small bilateral pleural effusions are questioned.
No pneumothorax is seen. Right glenohumeral osteoarthrosis is noted.
Thoracic dextroscoliosis is also noted.
IMPRESSION: Cardiomegaly and mild pulmonary edema. Superimposed atelectasis or
infection in the lung bases is not excluded.

## 2015-04-23 ENCOUNTER — Ambulatory Visit (INDEPENDENT_AMBULATORY_CARE_PROVIDER_SITE_OTHER): Payer: Medicare Other | Admitting: Physician Assistant

## 2015-04-23 ENCOUNTER — Ambulatory Visit (INDEPENDENT_AMBULATORY_CARE_PROVIDER_SITE_OTHER): Payer: Medicare Other

## 2015-04-23 VITALS — BP 136/72 | HR 63 | Temp 97.9°F | Resp 18 | Ht 62.0 in | Wt 171.4 lb

## 2015-04-23 DIAGNOSIS — R05 Cough: Secondary | ICD-10-CM

## 2015-04-23 DIAGNOSIS — J4541 Moderate persistent asthma with (acute) exacerbation: Secondary | ICD-10-CM | POA: Diagnosis not present

## 2015-04-23 DIAGNOSIS — R059 Cough, unspecified: Secondary | ICD-10-CM

## 2015-04-23 LAB — POCT CBC
Granulocyte percent: 69.6 %G (ref 37–80)
HCT, POC: 34.7 % — AB (ref 37.7–47.9)
HEMOGLOBIN: 12.1 g/dL — AB (ref 12.2–16.2)
Lymph, poc: 1.6 (ref 0.6–3.4)
MCH: 29 pg (ref 27–31.2)
MCHC: 34.8 g/dL (ref 31.8–35.4)
MCV: 83.5 fL (ref 80–97)
MID (cbc): 0.8 (ref 0–0.9)
MPV: 8.2 fL (ref 0–99.8)
PLATELET COUNT, POC: 211 10*3/uL (ref 142–424)
POC Granulocyte: 5.5 (ref 2–6.9)
POC LYMPH PERCENT: 20.8 %L (ref 10–50)
POC MID %: 9.6 %M (ref 0–12)
RBC: 4.15 M/uL (ref 4.04–5.48)
RDW, POC: 16.1 %
WBC: 7.9 10*3/uL (ref 4.6–10.2)

## 2015-04-23 MED ORDER — AMOXICILLIN-POT CLAVULANATE 875-125 MG PO TABS: 1.0000 | ORAL_TABLET | Freq: Two times a day (BID) | ORAL | Status: AC

## 2015-04-23 MED ORDER — PREDNISONE 20 MG PO TABS: ORAL_TABLET | ORAL | Status: DC

## 2015-04-23 MED ORDER — ALBUTEROL SULFATE HFA 108 (90 BASE) MCG/ACT IN AERS: 2.0000 | INHALATION_SPRAY | RESPIRATORY_TRACT | Status: DC | PRN

## 2015-04-23 NOTE — Progress Notes (Signed)
[facility] [address], [place] [address] [phone]  Date:  04/23/2015   Name:  Crystal Kerr   DOB:  1924-05-01   MRN:  [id number]  PCP:  Lamar Blinks, MD    Chief Complaint: Cough; Wheezing; and Chest Pain   History of Present Illness:  This is a 82 y.o. female with PMH A fib, asthma, BPPV, CHF who is presenting with cough and wheezing x 2 days. States she has chest pain with coughing. Chest feels tight. Cough is a mix of dry and productive. Feeling a little more sob than normal, she does have sob at baseline. Sleeps on her side. Has not had to change sleeping habits. No abdominal or extremity swelling. Mild rhinorrhea. Denies fever, chills, sore throat. Uses symbicort twice daily. Was seeing Dr. Melvyn Novas for her asthma. She was discharged about 3 months ago d/t stable disease. Been using albuterol 2-3 times a day in the past 2 days. At baseline, does not have to use.  She was seen here 02/09/15 by Dr. Tamala Julian for asthma exacerbation and bronchitis. Treated with augmentin and prednisone taper and symptoms improved.  Pt states she hates breathing treatments. States they leave her exhausted. She would prefer to not do any breathing treatments today.  Review of Systems:  Review of Systems See HPI  Patient Active Problem List   Diagnosis Date Noted  . Blister of leg without infection 09/25/2014  . Bilateral leg edema 12/19/2013  . 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 Benign paroxysmal positional vertigo 09/13/2013  . Upper airway cough syndrome 07/10/2013  . Allergic rhinitis 07/05/2013  . Persistent atrial fibrillation ([place]) 03/30/2013  . D-dimer, elevated 02/06/2013  . Asthma attack 02/06/2013  . Shortness of breath 02/06/2013  . Atrial fibrillation with RVR ([place]) 02/04/2013  . Acute on chronic diastolic congestive heart failure ([place]) 02/04/2013  . Arthritis of spine 07/01/2012   . Intrinsic asthma 07/01/2012  . Dyspnea 04/12/2012  . PVC's (premature ventricular contractions) 04/24/2011  . Murmur, cardiac 04/24/2011    Prior to Admission medications   Medication Sig Start Date End Date Taking? Authorizing Provider  acetaminophen (TYLENOL) 325 MG tablet Take 650 mg by mouth every 6 (six) hours as needed.   Yes Historical Provider, MD  albuterol (PROAIR HFA) 108 (90 BASE) MCG/ACT inhaler Inhale 2 puffs into the lungs every 4 (four) hours as needed for wheezing or shortness of breath. 07/26/14  Yes Tanda Rockers, MD  amiodarone (PACERONE) 200 MG tablet TAKE 1 TABLET BY MOUTH DAILY 03/29/15  Yes Wellington Hampshire, MD  budesonide-formoterol (SYMBICORT) 160-4.5 MCG/ACT inhaler Take 2 puffs first thing in am and then another 2 puffs about 12 hours later. 03/19/14  Yes Tanda Rockers, MD  Dextromethorphan-Guaifenesin [facility] DM) 30-600 MG TB12 Take 1 tablet by mouth 2 (two) times daily. 02/09/15  Yes Wardell Honour, MD  diltiazem (CARDIZEM CD) 120 MG 24 hr capsule Take 1 capsule (120 mg total) by mouth daily. TAKE ONE CAPSULE BY MOUTH EVERY DAY 10/23/14  Yes Wellington Hampshire, MD  diltiazem (TIAZAC) 120 MG 24 hr capsule TAKE 1 CAPSULE BY MOUTH DAILY 03/29/15  Yes Wellington Hampshire, MD  ELIQUIS 5 MG TABS tablet TAKE 1 TABLET BY MOUTH TWICE DAILY 02/25/15  Yes Wellington Hampshire, MD  famotidine (PEPCID) 20 MG tablet Take 1 tablet (20 mg total) by mouth at bedtime. 02/09/15  Yes Wardell Honour,  MD  HYDROcodone-acetaminophen (NORCO) 5-325 MG per tablet One half tablet every 8 hours 06/16/14  Yes Darlyne Russian, MD  ipratropium (ATROVENT) 0.03 % nasal spray Place 2 sprays into both nostrils at bedtime.   Yes Historical Provider, MD  meclizine (ANTIVERT) 25 MG tablet TAKE 1 TABLET (25 MG TOTAL) BY MOUTH 3 (THREE) TIMES DAILY AS NEEDED FOR DIZZINESS. 04/30/14  Yes Robyn Haber, MD  montelukast (SINGULAIR) 10 MG tablet Take 1 tablet (10 mg total) by mouth daily. 02/09/15  Yes Wardell Honour, MD   Multiple Vitamins-Minerals (MULTIVITAMIN WITH MINERALS) tablet Take 1 tablet by mouth daily.   Yes Historical Provider, MD  pantoprazole (PROTONIX) 40 MG tablet TAKE 1 TABLET (40 MG TOTAL) BY MOUTH DAILY. TAKE 30-60 MIN BEFORE FIRST MEAL OF THE DAY 11/26/14  Yes Tanda Rockers, MD    No Known Allergies  Past Surgical History  Procedure Laterality Date  . Appendectomy    . Cholecystectomy    . Tonsillectomy    . Oophrectomy    . Tee without cardioversion N/A 03/08/2013    Procedure: TRANSESOPHAGEAL ECHOCARDIOGRAM (TEE);  Surgeon: Larey Dresser, MD;  Location: [place];  Service: Cardiovascular;  Laterality: N/A;  . Cardioversion N/A 03/08/2013    Procedure: CARDIOVERSION;  Surgeon: Larey Dresser, MD;  Location: [facility];  Service: Cardiovascular;  Laterality: N/A;    Social History  Substance Use Topics  . Smoking status: Former Smoker -- 1.00 packs/day for 20 years    Types: Cigarettes    Quit date: 04/13/1981  . Smokeless tobacco: Never Used  . Alcohol Use: Yes     Comment: Occasional    Family History  Problem Relation Age of Onset  . Heart disease      No family history  . Heart failure Mother   . Diabetes Father     Medication list has been reviewed and updated.  Physical Examination:  Physical Exam  Constitutional: She is oriented to person, place, and time. She appears well-developed and well-nourished. No distress.  HENT:  Head: Normocephalic and atraumatic.  Right Ear: Hearing and external ear normal.  Left Ear: Hearing and external ear normal.  Nose: Nose normal.  Mouth/Throat: Uvula is midline, oropharynx is clear and moist and mucous membranes are normal.  Eyes: Conjunctivae and lids are normal. Right eye exhibits no discharge. Left eye exhibits no discharge. No scleral icterus.  Cardiovascular: Normal rate, regular rhythm, normal heart sounds and normal pulses.   No murmur heard. Pulmonary/Chest: Effort normal. No respiratory distress. She  has wheezes (diffuse). She has rhonchi (diffuse). She has no rales.  Musculoskeletal: Normal range of motion.  Lymphadenopathy:       Head (right side): No submental, no submandibular and no tonsillar adenopathy present.       Head (left side): No submental, no submandibular and no tonsillar adenopathy present.    She has no cervical adenopathy.  Neurological: She is alert and oriented to person, place, and time.  Skin: Skin is warm, dry and intact. No lesion and no rash noted.  Psychiatric: She has a normal mood and affect. Her speech is normal and behavior is normal. Thought content normal.   BP 136/72 mmHg  Pulse 63  Temp(Src) 97.9 F (36.6 C) (Oral)  Resp 18  Ht 5\' 2"  (1.575 m)  Wt 171 lb 6.4 oz (77.747 kg)  BMI 31.34 kg/m2  SpO2 95%  Results for orders placed or performed in visit on 04/23/15  POCT CBC  Result  Value Ref Range   WBC 7.9 4.6 - 10.2 K/uL   Lymph, poc 1.6 0.6 - 3.4   POC LYMPH PERCENT 20.8 10 - 50 %L   MID (cbc) 0.8 0 - 0.9   POC MID % 9.6 0 - 12 %M   POC Granulocyte 5.5 2 - 6.9   Granulocyte percent 69.6 37 - 80 %G   RBC 4.15 4.04 - 5.48 M/uL   Hemoglobin 12.1 (A) 12.2 - 16.2 g/dL   HCT, POC 34.7 (A) 37.7 - 47.9 %   MCV 83.5 80 - 97 fL   MCH, POC 29.0 27 - 31.2 pg   MCHC 34.8 31.8 - 35.4 g/dL   RDW, POC 16.1 %   Platelet Count, POC 211 142 - 424 K/uL   MPV 8.2 0 - 99.8 fL   Dg Chest 2 View  04/23/2015  CLINICAL DATA:  Two days of cough and wheezing, chest pain associated with coughing. History of asthma, atrial fibrillation, acute and chronic CHF, remote history of smoking. EXAM: CHEST  2 VIEW COMPARISON:  PA and lateral chest x-ray of February 09, 2015 FINDINGS: The lungs remain hyperinflated with mild hemidiaphragm flattening. There is no focal infiltrate. There is no pleural effusion. The heart and pulmonary vascularity are normal. The mediastinum is normal in width. There is calcification in the wall of the aortic arch. There is gentle dextrocurvature  centered in the mid thoracic spine. IMPRESSION: COPD. There is no evidence of pneumonia, CHF, nor other acute cardiopulmonary disease. Electronically Signed   By: David  Martinique M.D.   On: 04/23/2015 15:47   Assessment and Plan:  1. Asthma with acute exacerbation, moderate persistent 2. Cough CXR negative. CBC wnl. Vitals good, o2 sat 95%. Pt preferred to not do a breathing treatment in clinic today. Treat with prednisone taper, augmentin and albuterol prn -- this regimen 3 months ago worked well for her. Return in 1 week if symptoms do not improve or at any time if symptoms worsen.  - amoxicillin-clavulanate (AUGMENTIN) 875-125 MG tablet; Take 1 tablet by mouth 2 (two) times daily.  Dispense: 20 tablet; Refill: 0 - predniSONE (DELTASONE) 20 MG tablet; Take 3 PO QAM x3days, 2 PO QAM x3days, 1 PO QAM x3days  Dispense: 18 tablet; Refill: 0 - DG Chest 2 View; Future - POCT CBC   Benjaman Pott. Drenda Freeze, MHS [facility]  04/23/2015

## 2015-04-23 NOTE — Patient Instructions (Addendum)
Take augmentin twice a day for 10 days. Eat yogurt to protect your gut Start on prednisone taper for your wheezing. Use albuterol inhaler as needed for wheezing. Return in 1 week if symptoms do not improve or at any time if symptoms worsen.    IF you received an x-ray today, you will receive an invoice from [facility]. Please contact [facility] at [phone] with questions or concerns regarding your invoice.   IF you received labwork today, you will receive an invoice from [employer]. Please contact [facility] at [phone] with questions or concerns regarding your invoice.   Our billing staff will not be able to assist you with questions regarding bills from these companies.  You will be contacted with the lab results as soon as they are available. The fastest way to get your results is to activate your My Chart account. Instructions are located on the last page of this paperwork. If you have not heard from [place] regarding the results in 2 weeks, please contact this office.

## 2015-04-28 ENCOUNTER — Other Ambulatory Visit: Payer: Self-pay | Admitting: Internal Medicine

## 2015-04-30 ENCOUNTER — Encounter: Payer: Self-pay | Admitting: Cardiovascular Disease

## 2015-04-30 ENCOUNTER — Ambulatory Visit (INDEPENDENT_AMBULATORY_CARE_PROVIDER_SITE_OTHER): Payer: Medicare Other | Admitting: Cardiovascular Disease

## 2015-04-30 VITALS — BP 145/64 | HR 62 | Ht 63.0 in | Wt 169.8 lb

## 2015-04-30 DIAGNOSIS — I4819 Other persistent atrial fibrillation: Secondary | ICD-10-CM

## 2015-04-30 DIAGNOSIS — I481 Persistent atrial fibrillation: Secondary | ICD-10-CM | POA: Diagnosis not present

## 2015-04-30 NOTE — Patient Instructions (Signed)

## 2015-04-30 NOTE — Progress Notes (Signed)
Cardiology Office Note   Date:  04/30/2015   ID:  Crystal Kerr, DOB 22-Apr-1924, MRN [id number]  PCP:  Lamar Blinks, MD  Cardiologist:   Kathlyn Sacramento, MD   Chief Complaint  Patient presents with  . Follow-up    6 months  pt states she is getting over Bronchitis--no other Sx.      History of Present Illness: Crystal Kerr is a 82 y.o. female who presents for a followup visit regarding persistent atrial fibrillation maintained in sinus rhythm with amiodarone.  She was hospitalized at [facility] in February, 2015 for viral gastroenteritis. She was noted to be in atrial fibrillation with rapid ventricular response. She underwent TEE guided cardioversion and was placed on amiodarone. She has been maintaining sinus rhythm since then. Echocardiogram in 2015 showed normal LV systolic function.  She has been doing well overall with no recurrent palpitations or chest pain. She had recent bronchitis and currently on Augmentin and prednisone and gradually feeling better.  Past Medical History  Diagnosis Date  . Asthma   . PVC's (premature ventricular contractions)   . Arthritis   . CHF (congestive heart failure) ([place])   . Atrial fibrillation [facility])     Past Surgical History  Procedure Laterality Date  . Appendectomy    . Cholecystectomy    . Tonsillectomy    . Oophrectomy    . Tee without cardioversion N/A 03/08/2013    Procedure: TRANSESOPHAGEAL ECHOCARDIOGRAM (TEE);  Surgeon: Larey Dresser, MD;  Location: [place];  Service: Cardiovascular;  Laterality: N/A;  . Cardioversion N/A 03/08/2013    Procedure: CARDIOVERSION;  Surgeon: Larey Dresser, MD;  Location: [place];  Service: Cardiovascular;  Laterality: N/A;     Current Outpatient Prescriptions  Medication Sig Dispense Refill  . acetaminophen (TYLENOL) 325 MG tablet Take 650 mg by mouth every 6 (six) hours as needed.    Marland Kitchen albuterol (PROAIR HFA) 108 (90 Base) MCG/ACT inhaler Inhale 2 puffs into  the lungs every 4 (four) hours as needed for wheezing or shortness of breath. 18 g 5  . amiodarone (PACERONE) 200 MG tablet TAKE 1 TABLET BY MOUTH DAILY 90 tablet 0  . amoxicillin-clavulanate (AUGMENTIN) 875-125 MG tablet Take 1 tablet by mouth 2 (two) times daily. 20 tablet 0  . diltiazem (CARDIZEM CD) 120 MG 24 hr capsule Take 1 capsule (120 mg total) by mouth daily. TAKE ONE CAPSULE BY MOUTH EVERY DAY 90 capsule 1  . diltiazem (TIAZAC) 120 MG 24 hr capsule TAKE 1 CAPSULE BY MOUTH DAILY 90 capsule 0  . ELIQUIS 5 MG TABS tablet TAKE 1 TABLET BY MOUTH TWICE DAILY 60 tablet 4  . famotidine (PEPCID) 20 MG tablet Take 1 tablet (20 mg total) by mouth at bedtime. 30 tablet 11  . HYDROcodone-acetaminophen (NORCO) 5-325 MG per tablet One half tablet every 8 hours 20 tablet 0  . montelukast (SINGULAIR) 10 MG tablet Take 1 tablet (10 mg total) by mouth daily. 30 tablet 11  . Multiple Vitamins-Minerals (MULTIVITAMIN WITH MINERALS) tablet Take 1 tablet by mouth daily.    . pantoprazole (PROTONIX) 40 MG tablet TAKE 1 TABLET (40 MG TOTAL) BY MOUTH DAILY. TAKE 30-60 MIN BEFORE FIRST MEAL OF THE DAY 90 tablet 1  . predniSONE (DELTASONE) 20 MG tablet Take 3 PO QAM x3days, 2 PO QAM x3days, 1 PO QAM x3days 18 tablet 0  . SYMBICORT 160-4.5 MCG/ACT inhaler INHALE 2 PUFFS FIRST THING EVERY MORNING AND 2 PUFFS 12 HOURS LATER 10.2 g 0  No current facility-administered medications for this visit.    Allergies:   Review of patient's allergies indicates no known allergies.    Social History:  The patient  reports that she quit smoking about 34 years ago. Her smoking use included Cigarettes. She has a 20 pack-year smoking history. She has never used smokeless tobacco. She reports that she drinks alcohol. She reports that she does not use illicit drugs.   Family History:  The patient's family history includes Diabetes in her father; Heart failure in her mother.    ROS:  Please see the history of present illness.    Otherwise, review of systems are positive for none.   All other systems are reviewed and negative.    PHYSICAL EXAM: VS:  BP 145/64 mmHg  Pulse 62  Ht 5\' 3"  (1.6 m)  Wt 169 lb 12.8 oz (77.021 kg)  BMI 30.09 kg/m2 , BMI Body mass index is 30.09 kg/(m^2). GEN: Well nourished, well developed, in no acute distress HEENT: normal Neck: no JVD, carotid bruits, or masses Cardiac: RRR; no murmurs, rubs, or gallops,no edema  Respiratory: normal work of breathing. Coarse breath sounds bilaterally. GI: soft, nontender, nondistended, + BS MS: no deformity or atrophy Skin: warm and dry, no rash Neuro:  Strength and sensation are intact Psych: euthymic mood, full affect   EKG:  EKG is not ordered today.    Recent Labs: 09/25/2014: ALT 27; BUN 21; Creatinine, Ser 1.25*; Platelets 234.0; Potassium 4.0; Sodium 141 04/23/2015: Hemoglobin 12.1*    Lipid Panel    Component Value Date/Time   CHOL 228* 04/22/2011 0000   TRIG 157* 04/22/2011 0000   HDL 50 04/22/2011 0000   CHOLHDL 4.6 04/22/2011 0000   VLDL 31 04/22/2011 0000   LDLCALC 147* 04/22/2011 0000      Wt Readings from Last 3 Encounters:  04/30/15 169 lb 12.8 oz (77.021 kg)  04/23/15 171 lb 6.4 oz (77.747 kg)  02/09/15 172 lb (78.019 kg)       ASSESSMENT AND PLAN:  1.  Persistent atrial fibrillation: Maintaining in sinus rhythm with amiodarone. Continue long-term anticoagulation with Eliquis. Most recent labs in September 2016 showed normal CBC, stable renal function with a creatinine of 1.25 and normal liver profile.  2. Previous PVCs: Controlled with current medications.    Disposition:   FU with me in 6 months  Signed,  Kathlyn Sacramento, MD  04/30/2015 10:32 AM    [place]

## 2015-05-23 ENCOUNTER — Encounter (HOSPITAL_COMMUNITY): Payer: Self-pay

## 2015-05-23 ENCOUNTER — Emergency Department (HOSPITAL_COMMUNITY): Payer: Medicare Other

## 2015-05-23 ENCOUNTER — Emergency Department (HOSPITAL_COMMUNITY)
Admission: EM | Admit: 2015-05-23 | Discharge: 2015-05-24 | Disposition: A | Discharge status: Home / Self Care | Payer: Medicare Other | Attending: Emergency Medicine | Admitting: Emergency Medicine

## 2015-05-23 DIAGNOSIS — R109 Unspecified abdominal pain: Secondary | ICD-10-CM | POA: Diagnosis not present

## 2015-05-23 DIAGNOSIS — R1012 Left upper quadrant pain: Secondary | ICD-10-CM | POA: Diagnosis present

## 2015-05-23 DIAGNOSIS — Z79891 Long term (current) use of opiate analgesic: Secondary | ICD-10-CM | POA: Diagnosis not present

## 2015-05-23 DIAGNOSIS — Z7901 Long term (current) use of anticoagulants: Secondary | ICD-10-CM | POA: Insufficient documentation

## 2015-05-23 DIAGNOSIS — Z7952 Long term (current) use of systemic steroids: Secondary | ICD-10-CM | POA: Insufficient documentation

## 2015-05-23 DIAGNOSIS — J45909 Unspecified asthma, uncomplicated: Secondary | ICD-10-CM | POA: Diagnosis not present

## 2015-05-23 DIAGNOSIS — Z79899 Other long term (current) drug therapy: Secondary | ICD-10-CM | POA: Diagnosis not present

## 2015-05-23 DIAGNOSIS — Z7951 Long term (current) use of inhaled steroids: Secondary | ICD-10-CM | POA: Insufficient documentation

## 2015-05-23 DIAGNOSIS — I509 Heart failure, unspecified: Secondary | ICD-10-CM | POA: Insufficient documentation

## 2015-05-23 DIAGNOSIS — Y999 Unspecified external cause status: Secondary | ICD-10-CM | POA: Insufficient documentation

## 2015-05-23 DIAGNOSIS — S301XXA Contusion of abdominal wall, initial encounter: Secondary | ICD-10-CM | POA: Diagnosis not present

## 2015-05-23 DIAGNOSIS — R0781 Pleurodynia: Secondary | ICD-10-CM | POA: Diagnosis not present

## 2015-05-23 DIAGNOSIS — I4891 Unspecified atrial fibrillation: Secondary | ICD-10-CM | POA: Insufficient documentation

## 2015-05-23 DIAGNOSIS — S299XXA Unspecified injury of thorax, initial encounter: Secondary | ICD-10-CM | POA: Diagnosis not present

## 2015-05-23 DIAGNOSIS — S3991XA Unspecified injury of abdomen, initial encounter: Secondary | ICD-10-CM | POA: Diagnosis not present

## 2015-05-23 DIAGNOSIS — Z87891 Personal history of nicotine dependence: Secondary | ICD-10-CM | POA: Diagnosis not present

## 2015-05-23 DIAGNOSIS — Y939 Activity, unspecified: Secondary | ICD-10-CM | POA: Insufficient documentation

## 2015-05-23 DIAGNOSIS — Y9289 Other specified places as the place of occurrence of the external cause: Secondary | ICD-10-CM | POA: Diagnosis not present

## 2015-05-23 DIAGNOSIS — W19XXXA Unspecified fall, initial encounter: Secondary | ICD-10-CM | POA: Diagnosis not present

## 2015-05-23 LAB — BASIC METABOLIC PANEL
Anion gap: 8 (ref 5–15)
BUN: 25 mg/dL — AB (ref 6–20)
CALCIUM: 9.2 mg/dL (ref 8.9–10.3)
CHLORIDE: 103 mmol/L (ref 101–111)
CO2: 26 mmol/L (ref 22–32)
CREATININE: 1.18 mg/dL — AB (ref 0.44–1.00)
GFR calc non Af Amer: 40 mL/min — ABNORMAL LOW (ref >60)
GFR, EST AFRICAN AMERICAN: 46 mL/min — AB (ref >60)
Glucose, Bld: 90 mg/dL (ref 65–99)
Potassium: 4.3 mmol/L (ref 3.5–5.1)
Sodium: 137 mmol/L (ref 135–145)

## 2015-05-23 LAB — CBC WITH DIFFERENTIAL/PLATELET
BASOS ABS: 0 10*3/uL (ref 0.0–0.1)
Basophils Relative: 0 %
Eosinophils Absolute: 0.1 10*3/uL (ref 0.0–0.7)
Eosinophils Relative: 2 %
HEMATOCRIT: 35 % — AB (ref 36.0–46.0)
Hemoglobin: 11.5 g/dL — ABNORMAL LOW (ref 12.0–15.0)
LYMPHS ABS: 2.4 10*3/uL (ref 0.7–4.0)
LYMPHS PCT: 27 %
MCH: 27.4 pg (ref 26.0–34.0)
MCHC: 32.9 g/dL (ref 30.0–36.0)
MCV: 83.5 fL (ref 78.0–100.0)
MONO ABS: 1.1 10*3/uL — AB (ref 0.1–1.0)
MONOS PCT: 12 %
NEUTROS ABS: 5.3 10*3/uL (ref 1.7–7.7)
Neutrophils Relative %: 59 %
Platelets: 250 10*3/uL (ref 150–400)
RBC: 4.19 MIL/uL (ref 3.87–5.11)
RDW: 16.1 % — AB (ref 11.5–15.5)
WBC: 9 10*3/uL (ref 4.0–10.5)

## 2015-05-23 MED ORDER — SODIUM CHLORIDE 0.9 % IV SOLN
INTRAVENOUS | Status: DC
  Administered 2015-05-23: 23:00:00 via INTRAVENOUS

## 2015-05-23 MED ORDER — MORPHINE SULFATE (PF) 4 MG/ML IV SOLN
4.0000 mg | Freq: Once | INTRAVENOUS | Status: AC
  Administered 2015-05-23: 4 mg via INTRAVENOUS
  Filled 2015-05-23: qty 1

## 2015-05-23 MED ORDER — IOPAMIDOL (ISOVUE-300) INJECTION 61%
100.0000 mL | Freq: Once | INTRAVENOUS | Status: AC | PRN
  Administered 2015-05-23: 80 mL via INTRAVENOUS

## 2015-05-23 NOTE — Progress Notes (Signed)
CSW met with patient at bedside. Patient was alert and oriented. Daughter was present. Patient confirms that she presents to [facility] due to fall.  Patient and daughter informed CSW that she has fallen x4 within 6 months. Patient reports that she lives at home in [place] with her daughter. Patient states that prior to incident she completed her ADL's independently. Daughter and pt state that if rehab is needed they would be interested in home health. Both state that they are not interested in facility information.  Willette Brace [phone] ED CSW 05/23/2015 10:56 PM

## 2015-05-23 NOTE — ED Notes (Signed)
Pt fell Tuesday off the commode and landed on her left side on the tub, she has a bruise on that side and has been complaining of pain on that side and it's been hard to take a deep breath, now that pain is radiating to the front and her abdomen looks distended

## 2015-05-23 NOTE — ED Provider Notes (Signed)
CSN: [id number]     Arrival date & time 05/23/15  1905 History   First MD Initiated Contact with Patient 05/23/15 2120     Chief Complaint  Patient presents with  . Fall     (Consider location/radiation/quality/duration/timing/severity/associated sxs/prior Treatment) HPI Comments: Patient here complaining of left rib and left upper quadrant abdominal pain that occurred after she fell 2 days ago. Denied any head or neck injury. Complains of sharp pain is worse with movement. Pain radiates to her left upper quadrant and has not been associated with vomiting or diarrhea. She has had some bruising to the area. Has used over-the-counter medications without relief. Denies any hematuria. Symptoms worse with movement and better with remaining still.  Patient is a 82 y.o. female presenting with fall. The history is provided by the patient.  Fall    Past Medical History  Diagnosis Date  . Asthma   . PVC's (premature ventricular contractions)   . Arthritis   . CHF (congestive heart failure) ([place])   . Atrial fibrillation [facility])    Past Surgical History  Procedure Laterality Date  . Appendectomy    . Cholecystectomy    . Tonsillectomy    . Oophrectomy    . Tee without cardioversion N/A 03/08/2013    Procedure: TRANSESOPHAGEAL ECHOCARDIOGRAM (TEE);  Surgeon: Larey Dresser, MD;  Location: [place];  Service: Cardiovascular;  Laterality: N/A;  . Cardioversion N/A 03/08/2013    Procedure: CARDIOVERSION;  Surgeon: Larey Dresser, MD;  Location: [facility] ENDOSCOPY;  Service: Cardiovascular;  Laterality: N/A;   Family History  Problem Relation Age of Onset  . Heart disease      No family history  . Heart failure Mother   . Diabetes Father    Social History  Substance Use Topics  . Smoking status: Former Smoker -- 1.00 packs/day for 20 years    Types: Cigarettes    Quit date: 04/13/1981  . Smokeless tobacco: Never Used  . Alcohol Use: Yes     Comment: Occasional   OB History    No data  available     Review of Systems  All other systems reviewed and are negative.     Allergies  Review of patient's allergies indicates no known allergies.  Home Medications   Prior to Admission medications   Medication Sig Start Date End Date Taking? Authorizing Provider  acetaminophen (TYLENOL) 325 MG tablet Take 650 mg by mouth every 6 (six) hours as needed.    Historical Provider, MD  albuterol (PROAIR HFA) 108 (90 Base) MCG/ACT inhaler Inhale 2 puffs into the lungs every 4 (four) hours as needed for wheezing or shortness of breath. 04/23/15   Ezekiel Slocumb, PA-C  amiodarone (PACERONE) 200 MG tablet TAKE 1 TABLET BY MOUTH DAILY 03/29/15   [place] Hampshire, MD  diltiazem (CARDIZEM CD) 120 MG 24 hr capsule Take 1 capsule (120 mg total) by mouth daily. TAKE ONE CAPSULE BY MOUTH EVERY DAY 10/23/14   [place] Hampshire, MD  diltiazem (TIAZAC) 120 MG 24 hr capsule TAKE 1 CAPSULE BY MOUTH DAILY 03/29/15   [place] Hampshire, MD  ELIQUIS 5 MG TABS tablet TAKE 1 TABLET BY MOUTH TWICE DAILY 02/25/15   [place] Hampshire, MD  famotidine (PEPCID) 20 MG tablet Take 1 tablet (20 mg total) by mouth at bedtime. 02/09/15   Wardell Honour, MD  HYDROcodone-acetaminophen [facility]) 5-325 MG per tablet One half tablet every 8 hours 06/16/14   Darlyne Russian, MD  montelukast (  SINGULAIR) 10 MG tablet Take 1 tablet (10 mg total) by mouth daily. 02/09/15   Wardell Honour, MD  Multiple Vitamins-Minerals (MULTIVITAMIN WITH MINERALS) tablet Take 1 tablet by mouth daily.    Historical Provider, MD  pantoprazole (PROTONIX) 40 MG tablet TAKE 1 TABLET (40 MG TOTAL) BY MOUTH DAILY. TAKE 30-60 MIN BEFORE FIRST MEAL OF THE DAY 11/26/14   Tanda Rockers, MD  predniSONE (DELTASONE) 20 MG tablet Take 3 PO QAM x3days, 2 PO QAM x3days, 1 PO QAM x3days 04/23/15   Bennett Scrape V, PA-C  SYMBICORT 160-4.5 MCG/ACT inhaler INHALE 2 PUFFS FIRST THING EVERY MORNING AND 2 PUFFS 12 HOURS LATER 04/29/15   Tanda Rockers, MD   BP 160/50 mmHg  Pulse 70   Temp(Src) 98.4 F (36.9 C) (Oral)  Resp 18  Ht 5\' 3"  (1.6 m)  Wt 80.74 kg  BMI 31.54 kg/m2  SpO2 94% Physical Exam  Constitutional: She is oriented to person, place, and time. She appears well-developed and well-nourished.  Non-toxic appearance. No distress.  HENT:  Head: Normocephalic and atraumatic.  Eyes: Conjunctivae, EOM and lids are normal. Pupils are equal, round, and reactive to light.  Neck: Normal range of motion. Neck supple. No tracheal deviation present. No thyroid mass present.  Cardiovascular: Normal rate, regular rhythm and normal heart sounds.  Exam reveals no gallop.   No murmur heard. Pulmonary/Chest: Effort normal and breath sounds normal. No stridor. No respiratory distress. She has no decreased breath sounds. She has no wheezes. She has no rhonchi. She has no rales. She exhibits bony tenderness. She exhibits no laceration.    Abdominal: Soft. Normal appearance and bowel sounds are normal. She exhibits no distension. There is tenderness in the left upper quadrant. There is no rigidity, no rebound, no guarding and no CVA tenderness.  Musculoskeletal: Normal range of motion. She exhibits no edema or tenderness.  Neurological: She is alert and oriented to person, place, and time. She has normal strength. No cranial nerve deficit or sensory deficit. GCS eye subscore is 4. GCS verbal subscore is 5. GCS motor subscore is 6.  Skin: Skin is warm and dry. No abrasion and no rash noted.  Psychiatric: She has a normal mood and affect. Her speech is normal and behavior is normal.  Nursing note and vitals reviewed.   ED Course  Procedures (including critical care time) Labs Review Labs Reviewed  CBC WITH DIFFERENTIAL/PLATELET  BASIC METABOLIC PANEL    Imaging Review No results found. I have personally reviewed and evaluated these images and lab results as part of my medical decision-making.   EKG Interpretation None      MDM   Final diagnoses:  None    Patient  given pain meds and feels better. Reviewed the findings of patient's possible left kidney mass and she was instructed about follow-up. Abdominal CT shows large stool. Patient will use stool softeners. Return precautions given    Lacretia Leigh, MD 05/24/15 [phone]

## 2015-05-23 NOTE — ED Notes (Signed)
Patient transported to CT 

## 2015-05-24 DIAGNOSIS — S301XXA Contusion of abdominal wall, initial encounter: Secondary | ICD-10-CM | POA: Diagnosis not present

## 2015-05-24 MED ORDER — MAGNESIUM CITRATE PO SOLN: 1.0000 | Freq: Once | ORAL | Status: DC

## 2015-05-24 MED ORDER — METHOCARBAMOL 500 MG PO TABS: 500.0000 mg | ORAL_TABLET | Freq: Two times a day (BID) | ORAL | Status: DC

## 2015-05-24 MED ORDER — HYDROCODONE-ACETAMINOPHEN 5-325 MG PO TABS: 1.0000 | ORAL_TABLET | ORAL | Status: DC | PRN

## 2015-05-24 NOTE — Discharge Instructions (Signed)
Contusion A contusion is a deep bruise. Contusions are the result of a blunt injury to tissues and muscle fibers under the skin. The injury causes bleeding under the skin. The skin overlying the contusion may turn blue, purple, or yellow. Minor injuries will give you a painless contusion, but more severe contusions may stay painful and swollen for a few weeks.  CAUSES  This condition is usually caused by a blow, trauma, or direct force to an area of the body. SYMPTOMS  Symptoms of this condition include:  Swelling of the injured area.  Pain and tenderness in the injured area.  Discoloration. The area may have redness and then turn blue, purple, or yellow. DIAGNOSIS  This condition is diagnosed based on a physical exam and medical history. An X-ray, CT scan, or MRI may be needed to determine if there are any associated injuries, such as broken bones (fractures). TREATMENT  Specific treatment for this condition depends on what area of the body was injured. In general, the best treatment for a contusion is resting, icing, applying pressure to (compression), and elevating the injured area. This is often called the RICE strategy. Over-the-counter anti-inflammatory medicines may also be recommended for pain control.  HOME CARE INSTRUCTIONS   Rest the injured area.  If directed, apply ice to the injured area:  Put ice in a plastic bag.  Place a towel between your skin and the bag.  Leave the ice on for 20 minutes, 2-3 times per day.  If directed, apply light compression to the injured area using an elastic bandage. Make sure the bandage is not wrapped too tightly. Remove and reapply the bandage as directed by your health care provider.  If possible, raise (elevate) the injured area above the level of your heart while you are sitting or lying down.  Take over-the-counter and prescription medicines only as told by your health care provider. SEEK MEDICAL CARE IF:  Your symptoms do not  improve after several days of treatment.  Your symptoms get worse.  You have difficulty moving the injured area. SEEK IMMEDIATE MEDICAL CARE IF:   You have severe pain.  You have numbness in a hand or foot.  Your hand or foot turns pale or cold.   This information is not intended to replace advice given to you by your health care provider. Make sure you discuss any questions you have with your health care provider.   Document Released: 10/08/2004 Document Revised: 09/19/2014 Document Reviewed: 05/16/2014 Elsevier Interactive Patient Education 2016 [employer]. Constipation, Adult Constipation is when a person has fewer than three bowel movements a week, has difficulty having a bowel movement, or has stools that are dry, hard, or larger than normal. As people grow older, constipation is more common. A low-fiber diet, not taking in enough fluids, and taking certain medicines may make constipation worse.  CAUSES   Certain medicines, such as antidepressants, pain medicine, iron supplements, antacids, and water pills.   Certain diseases, such as diabetes, irritable bowel syndrome (IBS), thyroid disease, or depression.   Not drinking enough water.   Not eating enough fiber-rich foods.   Stress or travel.   Lack of physical activity or exercise.   Ignoring the urge to have a bowel movement.   Using laxatives too much.  SIGNS AND SYMPTOMS   Having fewer than three bowel movements a week.   Straining to have a bowel movement.   Having stools that are hard, dry, or larger than normal.   Feeling full  or bloated.   Pain in the lower abdomen.   Not feeling relief after having a bowel movement.  DIAGNOSIS  Your health care provider will take a medical history and perform a physical exam. Further testing may be done for severe constipation. Some tests may include:  A barium enema X-ray to examine your rectum, colon, and, sometimes, your small intestine.   A  sigmoidoscopy to examine your lower colon.   A colonoscopy to examine your entire colon. TREATMENT  Treatment will depend on the severity of your constipation and what is causing it. Some dietary treatments include drinking more fluids and eating more fiber-rich foods. Lifestyle treatments may include regular exercise. If these diet and lifestyle recommendations do not help, your health care provider may recommend taking over-the-counter laxative medicines to help you have bowel movements. Prescription medicines may be prescribed if over-the-counter medicines do not work.  HOME CARE INSTRUCTIONS   Eat foods that have a lot of fiber, such as fruits, vegetables, whole grains, and beans.  Limit foods high in fat and processed sugars, such as french fries, hamburgers, cookies, candies, and soda.   A fiber supplement may be added to your diet if you cannot get enough fiber from foods.   Drink enough fluids to keep your urine clear or pale yellow.   Exercise regularly or as directed by your health care provider.   Go to the restroom when you have the urge to go. Do not hold it.   Only take over-the-counter or prescription medicines as directed by your health care provider. Do not take other medicines for constipation without talking to your health care provider first.  [place] IF:   You have bright red blood in your stool.   Your constipation lasts for more than 4 days or gets worse.   You have abdominal or rectal pain.   You have thin, pencil-like stools.   You have unexplained weight loss. MAKE SURE YOU:   Understand these instructions.  Will watch your condition.  Will get help right away if you are not doing well or get worse.   This information is not intended to replace advice given to you by your health care provider. Make sure you discuss any questions you have with your health care provider.   Document Released: 09/27/2003 Document Revised:  01/19/2014 Document Reviewed: 10/10/2012 Elsevier Interactive Patient Education 2016 [place]. Muscle Strain A muscle strain is an injury that occurs when a muscle is stretched beyond its normal length. Usually a small number of muscle fibers are torn when this happens. Muscle strain is rated in degrees. First-degree strains have the least amount of muscle fiber tearing and pain. Second-degree and third-degree strains have increasingly more tearing and pain.  Usually, recovery from muscle strain takes 1-2 weeks. Complete healing takes 5-6 weeks.  CAUSES  Muscle strain happens when a sudden, violent force placed on a muscle stretches it too far. This may occur with lifting, sports, or a fall.  RISK FACTORS Muscle strain is especially common in athletes.  SIGNS AND SYMPTOMS At the site of the muscle strain, there may be:  Pain.  Bruising.  Swelling.  Difficulty using the muscle due to pain or lack of normal function. DIAGNOSIS  Your health care provider will perform a physical exam and ask about your medical history. TREATMENT  Often, the best treatment for a muscle strain is resting, icing, and applying cold compresses to the injured area.  HOME CARE INSTRUCTIONS  Use the PRICE method of treatment to promote muscle healing during the first 2-3 days after your injury. The PRICE method involves:  Protecting the muscle from being injured again.  Restricting your activity and resting the injured body part.  Icing your injury. To do this, put ice in a plastic bag. Place a towel between your skin and the bag. Then, apply the ice and leave it on from 15-20 minutes each hour. After the third day, switch to moist heat packs.  Apply compression to the injured area with a splint or elastic bandage. Be careful not to wrap it too tightly. This may interfere with blood circulation or increase swelling.  Elevate the injured body part above the level of your heart as often as you  can.  Only take over-the-counter or prescription medicines for pain, discomfort, or fever as directed by your health care provider.  Warming up prior to exercise helps to prevent future muscle strains. SEEK MEDICAL CARE IF:   You have increasing pain or swelling in the injured area.  You have numbness, tingling, or a significant loss of strength in the injured area. MAKE SURE YOU:   Understand these instructions.  Will watch your condition.  Will get help right away if you are not doing well or get worse.   This information is not intended to replace advice given to you by your health care provider. Make sure you discuss any questions you have with your health care provider.   Document Released: 12/29/2004 Document Revised: 10/19/2012 Document Reviewed: 07/28/2012 Elsevier Interactive Patient Education [employer].

## 2015-05-28 ENCOUNTER — Other Ambulatory Visit: Payer: Self-pay | Admitting: Internal Medicine

## 2015-06-08 DIAGNOSIS — H2513 Age-related nuclear cataract, bilateral: Secondary | ICD-10-CM | POA: Diagnosis not present

## 2015-06-29 ENCOUNTER — Other Ambulatory Visit: Payer: Self-pay | Admitting: Internal Medicine

## 2015-07-02 ENCOUNTER — Other Ambulatory Visit: Payer: Self-pay | Admitting: Cardiovascular Disease

## 2015-07-02 NOTE — Telephone Encounter (Signed)
Review for refill, Thank you. 

## 2015-07-02 NOTE — Telephone Encounter (Signed)
Rx has been sent to the pharmacy electronically. ° °

## 2015-07-03 ENCOUNTER — Telehealth: Payer: Self-pay

## 2015-07-03 MED ORDER — BUDESONIDE-FORMOTEROL FUMARATE 160-4.5 MCG/ACT IN AERO
INHALATION_SPRAY | RESPIRATORY_TRACT | Status: DC
Start: 2015-07-03 — End: 2016-07-02

## 2015-07-03 NOTE — Telephone Encounter (Signed)
Meds ordered this encounter  Medications  . budesonide-formoterol (SYMBICORT) 160-4.5 MCG/ACT inhaler    Sig: INHALE 2 PUFFS FIRST THING EVERY MORNING AND 2 PUFFS EVERY 12 HOURS    Dispense:  10.2 g    Refill:  12    Defer to PCP for refills    Order Specific Question:  Supervising Provider    Answer:  Tami Lin P [id number]

## 2015-07-03 NOTE — Telephone Encounter (Signed)
Can we refill? 

## 2015-07-03 NOTE — Telephone Encounter (Signed)
Pt has just been release from her pulmonologist and was told to see her primary care for the refill on her symbicort inhaler   Best number [phone]

## 2015-07-04 NOTE — Telephone Encounter (Signed)
Notified pt's daughter RF was sent.

## 2015-07-28 ENCOUNTER — Other Ambulatory Visit: Payer: Self-pay | Admitting: Cardiovascular Disease

## 2015-09-12 ENCOUNTER — Other Ambulatory Visit: Payer: Self-pay | Admitting: Physician Assistant

## 2015-09-12 ENCOUNTER — Ambulatory Visit (INDEPENDENT_AMBULATORY_CARE_PROVIDER_SITE_OTHER): Payer: Medicare Other | Admitting: Physician Assistant

## 2015-09-12 ENCOUNTER — Ambulatory Visit (INDEPENDENT_AMBULATORY_CARE_PROVIDER_SITE_OTHER): Payer: Medicare Other

## 2015-09-12 VITALS — BP 104/64 | HR 67 | Temp 98.1°F | Resp 18 | Ht 63.0 in | Wt 170.0 lb

## 2015-09-12 DIAGNOSIS — Z23 Encounter for immunization: Secondary | ICD-10-CM | POA: Diagnosis not present

## 2015-09-12 DIAGNOSIS — R21 Rash and other nonspecific skin eruption: Secondary | ICD-10-CM | POA: Diagnosis not present

## 2015-09-12 DIAGNOSIS — M160 Bilateral primary osteoarthritis of hip: Secondary | ICD-10-CM

## 2015-09-12 MED ORDER — PREDNISONE 20 MG PO TABS: 20.0000 mg | ORAL_TABLET | Freq: Every day | ORAL | 0 refills | Status: DC

## 2015-09-12 MED ORDER — TRIAMCINOLONE ACETONIDE 0.5 % EX OINT: 1.0000 "application " | TOPICAL_OINTMENT | Freq: Two times a day (BID) | CUTANEOUS | 0 refills | Status: DC

## 2015-09-12 NOTE — Patient Instructions (Addendum)
Okay to take 1000 mg of tylenol every 6-8 hours for hip pain.  Please complete the course of prednisone.     IF you received an x-ray today, you will receive an invoice from [facility]. Please contact [facility] at [phone] with questions or concerns regarding your invoice.   IF you received labwork today, you will receive an invoice from [employer]. Please contact [facility] at [phone] with questions or concerns regarding your invoice.   Our billing staff will not be able to assist you with questions regarding bills from these companies.  You will be contacted with the lab results as soon as they are available. The fastest way to get your results is to activate your My Chart account. Instructions are located on the last page of this paperwork. If you have not heard from [place] regarding the results in 2 weeks, please contact this office.

## 2015-09-12 NOTE — Progress Notes (Signed)
09/12/2015 4:03 PM   DOB: 11/22/1924 / MRN: [id number]  SUBJECTIVE:  Crystal Kerr is a 82 y.o. female presenting for left hip pain that started a few weeks ago and "is not really getting better."  Going from lying to sitting makes the pain worse.  Walking does not really bother her.  She has a history of arthritis in her shoulder and back. She has tried Tylenol for the pain with some relief.    Complains of two "bug bites" on her back side.  They have been present for about a week.  Complains of itching.  Denies pain.  Normal appetite.  No fevers, chills.  Has been using benadryl cream with some relief.   She has No Known Allergies.   She  has a past medical history of Arthritis; Asthma; Atrial fibrillation (HCC); CHF (congestive heart failure) ([place]); and PVC's (premature ventricular contractions).    She  reports that she quit smoking about 34 years ago. Her smoking use included Cigarettes. She has a 20.00 pack-year smoking history. She has never used smokeless tobacco. She reports that she drinks alcohol. She reports that she does not use drugs. She  reports that she does not engage in sexual activity. The patient  has a past surgical history that includes Appendectomy; Cholecystectomy; Tonsillectomy; oophrectomy; TEE without cardioversion (N/A, 03/08/2013); and Cardioversion (N/A, 03/08/2013).  Her family history includes Diabetes in her father; Heart failure in her mother.  Review of Systems  Constitutional: Negative for chills and fever.  Respiratory: Negative for cough.   Cardiovascular: Negative for chest pain.  Gastrointestinal: Negative for diarrhea.  Musculoskeletal: Positive for joint pain and myalgias. Negative for falls.  Skin: Negative for rash.  Neurological: Negative for dizziness and headaches.  Psychiatric/Behavioral: Negative for depression.    The problem list and medications were reviewed and updated by myself where necessary and exist elsewhere in the  encounter.   OBJECTIVE:  BP 104/64 (BP Location: Right Arm, Patient Position: Sitting, Cuff Size: Normal)   Pulse 67   Temp 98.1 F (36.7 C) (Oral)   Resp 18   Ht 5\' 3"  (1.6 m)   Wt 170 lb (77.1 kg)   SpO2 95%   BMI 30.11 kg/m   Physical Exam  Constitutional: She is oriented to person, place, and time.  Cardiovascular: Normal rate, regular rhythm and normal heart sounds.   Pulmonary/Chest: Effort normal and breath sounds normal.  Abdominal: Soft. Bowel sounds are normal.  Musculoskeletal:       Left hip: She exhibits tenderness. She exhibits normal range of motion, normal strength, no bony tenderness, no swelling, no crepitus, no deformity and no laceration.       Legs: Neurological: She is alert and oriented to person, place, and time. She displays normal reflexes. No cranial nerve deficit. She exhibits normal muscle tone. Coordination normal.    No results found for this or any previous visit (from the past 72 hour(s)).  Dg Hip Unilat W Or W/o Pelvis 2-3 Views Left  Result Date: 09/12/2015 CLINICAL DATA:  Left hip pain for 3 weeks. No known injury. Initial encounter. EXAM: DG HIP (WITH OR WITHOUT PELVIS) 2-3V LEFT COMPARISON:  CT abdomen and pelvis 05/23/2015. FINDINGS: No acute bony or joint abnormality is seen. Mild degenerative disease about the hips appears symmetric from right to left. No focal bony lesion is noted. Lower lumbar spondylosis is identified. Suture material in the right lower quadrant of the abdomen is identified. IMPRESSION: No acute abnormality. Mild to  moderate bilateral hip degenerative change. Electronically Signed   By: Inge Rise M.D.   On: 09/12/2015 15:48   Lab Results  Component Value Date   CREATININE 1.18 (H) 05/23/2015     ASSESSMENT AND PLAN  Jahnya was seen today for mass, groin pain and flu vaccine.  Diagnoses and all orders for this visit:  Need for influenza vaccination -     Flu Vaccine QUAD 36+ mos IM -     Cancel: DG HIP  UNILAT W OR W/O PELVIS 2-3 VIEWS RIGHT; Future  Primary osteoarthritis of both hips: She is taking blood thinners and her creatinine is elevated making NSAIDs a challenge.  Will write for a low dose of prednisone.   -     predniSONE (DELTASONE) 20 MG tablet; Take 1 tablet (20 mg total) by mouth daily with breakfast.  Rash and nonspecific skin eruption -     triamcinolone ointment (KENALOG) 0.5 %; Apply 1 application topically 2 (two) times daily.    The patient is advised to call or return to clinic if she does not see an improvement in symptoms, or to seek the care of the closest emergency department if she worsens with the above plan.   Philis Fendt, MHS, PA-C [facility] 09/12/2015 4:03 PM

## 2015-09-18 ENCOUNTER — Encounter: Payer: Self-pay | Admitting: Family Medicine

## 2015-09-18 ENCOUNTER — Ambulatory Visit (INDEPENDENT_AMBULATORY_CARE_PROVIDER_SITE_OTHER): Payer: Medicare Other | Admitting: Family Medicine

## 2015-09-18 VITALS — BP 138/72 | HR 62 | Temp 98.2°F | Ht 63.0 in | Wt 169.8 lb

## 2015-09-18 DIAGNOSIS — L723 Sebaceous cyst: Secondary | ICD-10-CM | POA: Diagnosis not present

## 2015-09-18 DIAGNOSIS — M25552 Pain in left hip: Secondary | ICD-10-CM

## 2015-09-18 DIAGNOSIS — Z8679 Personal history of other diseases of the circulatory system: Secondary | ICD-10-CM | POA: Diagnosis not present

## 2015-09-18 DIAGNOSIS — J209 Acute bronchitis, unspecified: Secondary | ICD-10-CM | POA: Diagnosis not present

## 2015-09-18 MED ORDER — DOXYCYCLINE HYCLATE 100 MG PO TABS: 100.0000 mg | ORAL_TABLET | Freq: Two times a day (BID) | ORAL | 0 refills | Status: DC

## 2015-09-18 MED ORDER — TRAMADOL HCL 50 MG PO TABS: ORAL_TABLET | ORAL | 0 refills | Status: DC

## 2015-09-18 NOTE — Progress Notes (Signed)
Pre visit review using our clinic review tool, if applicable. No additional management support is needed unless otherwise documented below in the visit note. 

## 2015-09-18 NOTE — Progress Notes (Signed)
[place] at [facility] [address], [place], [place] [address] [phone]  Date:  09/18/2015   Name:  Crystal Kerr   DOB:  Dec 10, 1924   MRN:  [id number]  PCP:  Lamar Blinks, MD    Chief Complaint: Insect Bite (c/o insect bite on lower waist on the right side x 10 days.); chest congestion (c/o chest congestion and prod cough. ); and Pulled muscle (c/o pulled muscle in groin area x 1 month.)   History of Present Illness:  Crystal Kerr is a 82 y.o. very pleasant female patient who presents with the following:  She has noted a "bug bite" on her right waist for about 10 days.  It is not painful but can be itchy.  "it worries my daughters more than it worries me."  They have noted that the area seems hard and are concerned that it may be an abscess  She had noted some left groin pain for about one month. It hurts more when she stands up from a seated position No falls. She is using tylenol which does help somewhat  She was seen at [facility] on 8/31 and started on 10 days of prednisone for presumed OA pain  She has noted some mucus in her chest for 2-3 days.  No fever.  She is breathing ok, but just has congestion in her lungs.  She will have some cough as well- may cough up some mucus at times.  She is a bit worried because generally prednisone will treat this sort of symptom for her and this time she does not seem to be getting better.  Her weight is stable and she does not seem to be retaining fluid  She had cardioversion and thinks that she is in sinus rhythm. She is on eliquis now,  Sees Dr. Fletcher Anon every 6 months.    Wt Readings from Last 3 Encounters:  09/18/15 169 lb 12.8 oz (77 kg)  09/12/15 170 lb (77.1 kg)  05/23/15 178 lb (80.7 kg)      Dg Hip Unilat W Or W/o Pelvis 2-3 Views Left  Result Date: 09/12/2015 CLINICAL DATA:  Left hip pain for 3 weeks. No known injury. Initial encounter. EXAM: DG HIP (WITH OR  WITHOUT PELVIS) 2-3V LEFT COMPARISON:  CT abdomen and pelvis 05/23/2015. FINDINGS: No acute bony or joint abnormality is seen. Mild degenerative disease about the hips appears symmetric from right to left. No focal bony lesion is noted. Lower lumbar spondylosis is identified. Suture material in the right lower quadrant of the abdomen is identified. IMPRESSION: No acute abnormality. Mild to moderate bilateral hip degenerative change. Electronically Signed   By: Inge Rise M.D.   On: 09/12/2015 15:48     Patient Active Problem List   Diagnosis Date Noted  . Blister of leg without infection 09/25/2014  . Bilateral leg edema 12/19/2013  . 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 Benign paroxysmal positional vertigo 09/13/2013  . Upper airway cough syndrome 07/10/2013  . Allergic rhinitis 07/05/2013  . Persistent atrial fibrillation ([place]) 03/30/2013  . D-dimer, elevated 02/06/2013  . Asthma attack 02/06/2013  . Shortness of breath 02/06/2013  . Atrial fibrillation with RVR ([place]) 02/04/2013  . Acute on chronic diastolic congestive heart failure ([place]) 02/04/2013  . Arthritis of spine 07/01/2012  . Intrinsic asthma 07/01/2012  . Dyspnea 04/12/2012  . PVC's (premature ventricular contractions) 04/24/2011  .  Murmur, cardiac 04/24/2011    Past Medical History:  Diagnosis Date  . Arthritis   . Asthma   . Atrial fibrillation ([place])   . CHF (congestive heart failure) ([place])   . PVC's (premature ventricular contractions)     Past Surgical History:  Procedure Laterality Date  . APPENDECTOMY    . CARDIOVERSION N/A 03/08/2013   Procedure: CARDIOVERSION;  Surgeon: Larey Dresser, MD;  Location: [place];  Service: Cardiovascular;  Laterality: N/A;  . CHOLECYSTECTOMY    . oophrectomy    . TEE WITHOUT CARDIOVERSION N/A 03/08/2013   Procedure: TRANSESOPHAGEAL ECHOCARDIOGRAM (TEE);  Surgeon: Larey Dresser, MD;  Location: [place];  Service: Cardiovascular;  Laterality: N/A;  . TONSILLECTOMY      Social History  Substance Use Topics  . Smoking status: Former Smoker    Packs/day: 1.00    Years: 20.00    Types: Cigarettes    Quit date: 04/13/1981  . Smokeless tobacco: Never Used  . Alcohol use Yes     Comment: Occasional    Family History  Problem Relation Age of Onset  . Heart disease      No family history  . Heart failure Mother   . Diabetes Father     No Known Allergies  Medication list has been reviewed and updated.  Current Outpatient Prescriptions on File Prior to Visit  Medication Sig Dispense Refill  . acetaminophen (TYLENOL) 500 MG tablet Take 1,000 mg by mouth every 4 (four) hours as needed for moderate pain.    Marland Kitchen albuterol (PROAIR HFA) 108 (90 Base) MCG/ACT inhaler Inhale 2 puffs into the lungs every 4 (four) hours as needed for wheezing or shortness of breath. 18 g 5  . amiodarone (PACERONE) 200 MG tablet TAKE 1 TABLET BY MOUTH DAILY 90 tablet 2  . budesonide-formoterol (SYMBICORT) 160-4.5 MCG/ACT inhaler INHALE 2 PUFFS FIRST THING EVERY MORNING AND 2 PUFFS EVERY 12 HOURS 10.2 g 12  . diltiazem (TIAZAC) 120 MG 24 hr capsule TAKE 1 CAPSULE BY MOUTH DAILY 90 capsule 2  . docusate sodium (COLACE) 100 MG capsule Take 200 mg by mouth daily as needed for mild constipation.    Marland Kitchen ELIQUIS 5 MG TABS tablet TAKE 1 TABLET BY MOUTH TWICE DAILY 60 tablet 3  . famotidine (PEPCID) 20 MG tablet Take 1 tablet (20 mg total) by mouth at bedtime. 30 tablet 11  . montelukast (SINGULAIR) 10 MG tablet Take 1 tablet (10 mg total) by mouth daily. 30 tablet 11  . Multiple Vitamins-Minerals (MULTIVITAMIN WITH MINERALS) tablet Take 1 tablet by mouth daily.    . pantoprazole (PROTONIX) 40 MG tablet TAKE 1 TABLET (40 MG TOTAL) BY MOUTH DAILY. TAKE 30-60 MIN BEFORE FIRST MEAL OF THE DAY 90 tablet 1  . predniSONE (DELTASONE) 20 MG tablet Take 1 tablet (20 mg total) by mouth daily with breakfast. 10 tablet 0  .  triamcinolone ointment (KENALOG) 0.5 % Apply 1 application topically 2 (two) times daily. 30 g 0   No current facility-administered medications on file prior to visit.     Review of Systems:  As per HPI- otherwise negative.   Physical Examination: Vitals:   09/18/15 1357  BP: 138/72  Pulse: 62  Temp: 98.2 F (36.8 C)   Vitals:   09/18/15 1357  Weight: 169 lb 12.8 oz (77 kg)  Height: 5\' 3"  (1.6 m)   Body mass index is 30.08 kg/m. Ideal Body Weight: Weight in (lb) to have BMI = 25: 140.8  GEN: WDWN, NAD, Non-toxic, A & O x 3, looks well, overweight, here with her daughter today HEENT: Atraumatic, Normocephalic. Neck supple. No masses, No LAD. Ears and Nose: No external deformity. CV: RRR, No M/G/R. No JVD. No thrill. No extra heart sounds.  Sinus rhythm  PULM: she has diffuse mild ronchi and congestion in the bilateral lungs. No retractions. No resp. distress. No accessory muscle use. ABD: S, NT, ND EXTR: No c/c/e NEURO Normal gait.  PSYCH: Normally interactive. Conversant. Not depressed or anxious appearing.  Calm demeanor.  On her right waist is a firm nodule with an obvious central pore; appears c/w an inflamed sebaceous cyst.  However it is not at all tender and nothing can be expressed with firm pressure She has normal ROM of the right hip, but has pain when she pushes up for a squatting position. She has some pain with FABER testing but this is not consistent.    Creat clearance is 33 Assessment and Plan: Acute bronchitis, unspecified organism - Plan: doxycycline (VIBRA-TABS) 100 MG tablet  Left hip pain - Plan: traMADol (ULTRAM) 50 MG tablet  Sebaceous cyst - Plan: doxycycline (VIBRA-TABS) 100 MG tablet  History of atrial fibrillation  Here today with a few concerns She looks great but her lungs sound full of mucus.  Will add doxycycline to her regimen as she is already on prednisone.  COPD noted to be likely on recent CXR Will also use doxy to prevent any  infection from forming in sebaceous cyst.  Offered to I and D, but as it is not painful she declines for now.  We will monitor this Currently in SR, continue eliquis for intermittent a fib Tramadol as needed for hip pain likely caused by OA. At this point not bothering her enough to pursue any other more specific evaluation and she is not interested in any possible invasive treatment  Asked to let me know if not feeling better in the next 2-3 days- Sooner if worse.  Otherwise plan to follow-up in 3-4 months  Meds ordered this encounter  Medications  . doxycycline (VIBRA-TABS) 100 MG tablet    Sig: Take 1 tablet (100 mg total) by mouth 2 (two) times daily.    Dispense:  20 tablet    Refill:  0  . traMADol (ULTRAM) 50 MG tablet    Sig: Take 1/2 or 1 tablet by mouth every 8 hours as needed for hip pain    Dispense:  40 tablet    Refill:  0    Signed Lamar Blinks, MD

## 2015-09-18 NOTE — Patient Instructions (Signed)
I think the spot on your right side is a sebaceous cyst. Try to leave it alone, and I suspect it may get smaller over the next couple of weeks.  If it is painful or red come in and I can drain it for you  Try the tramadol as needed for hip pain that does not respond to tylenol. This may cause you to feel sleepy! Let me know if this does not help or if it becomes mother bothersome  Elizebeth Koller out your prednisone rx as directed  Use the doxycycline rx (antibiotic) for your chest and also for the sebaceous cyst   Please see me in 3-4 months for a recheck- sooner if you need anything

## 2015-09-27 ENCOUNTER — Ambulatory Visit (INDEPENDENT_AMBULATORY_CARE_PROVIDER_SITE_OTHER): Payer: Medicare Other | Admitting: Family Medicine

## 2015-09-27 ENCOUNTER — Encounter: Payer: Self-pay | Admitting: Family Medicine

## 2015-09-27 VITALS — BP 148/52 | HR 64 | Temp 98.5°F | Ht 63.0 in | Wt 168.4 lb

## 2015-09-27 DIAGNOSIS — J209 Acute bronchitis, unspecified: Secondary | ICD-10-CM | POA: Diagnosis not present

## 2015-09-27 DIAGNOSIS — S8012XA Contusion of left lower leg, initial encounter: Secondary | ICD-10-CM | POA: Diagnosis not present

## 2015-09-27 DIAGNOSIS — T148XXA Other injury of unspecified body region, initial encounter: Secondary | ICD-10-CM

## 2015-09-27 NOTE — Patient Instructions (Signed)
Claritin (loratadine), Allegra (fexofenadine), Zyrtec (cetirizine); these are listed in order from weakest to strongest. Generic, and therefore cheaper, options are in the parentheses.   Flonase (fluticasone); nasal spray that is over the counter. 2 sprays each nostril, once daily. Aim towards the same side eye when you spray.  There are available OTC, and the generic versions, which may be cheaper, are in parentheses. Show this to a pharmacist if you have trouble finding any of these items.  

## 2015-09-27 NOTE — Progress Notes (Signed)
Pre visit review using our clinic review tool, if applicable. No additional management support is needed unless otherwise documented below in the visit note. 

## 2015-09-27 NOTE — Progress Notes (Signed)
Chief Complaint  Patient presents with  . Follow-up    on bronchitis-pt states still have cough(product-white-clear),along with ruuny nose, and no energy.  . Bleeding/Bruising    on (R) lower leg-pt states she bumpped it on Wed of this week-on pain-pt does take [place] here for URI complaints. Here with daughter.  Duration: 12 days Associated symptoms: productive cough, nasal congestion, post-nasal drip and runny nose Denies: fevers, ST, SOB, ear complaints Treatment to date: Doxy, prednisone, Mucinex,  Sick contacts: Other daughter has the same thing She does have a hx of CHF, but is breathing fine and has not had any weight gain.  ROS:  Const: Denies fevers HEENT: As noted in HPI Lungs: No SOB  Past Medical History:  Diagnosis Date  . Arthritis   . Asthma   . Atrial fibrillation ([place])   . CHF (congestive heart failure) ([place])   . PVC's (premature ventricular contractions)    Family History  Problem Relation Age of Onset  . Heart disease      No family history  . Heart failure Mother   . Diabetes Father     BP (!) 148/52 (BP Location: Left Arm, Patient Position: Sitting, Cuff Size: Normal)   Pulse 64   Temp 98.5 F (36.9 C) (Oral)   Ht 5\' 3"  (1.6 m)   Wt 168 lb 6.4 oz (76.4 kg)   SpO2 96%   BMI 29.83 kg/m  General: Awake, alert, appears stated age HEENT: AT, [place], ears patent b/l and TM's neg, nares patent w/o discharge, pharynx pink and without exudates, MMM Neck: No masses or asymmetry Heart: RRR, 2/6 SEM heard loudest at aortic listening post, no bruits; 2+ pitting edema b/l up to prox 1/3 of tibia Lungs: CTAB, no accessory muscle use; initially heard a lot of noises from secretions, after she coughed things were clear.  Heme: Bruise on RLE- 5 cm vertically, 10 cm horizontal on the caudal end, 7 cm on the caudal portion; no TTP Psych: Age appropriate judgment and insight, normal mood and affect  Acute bronchitis, unspecified  organism  Bruise   Reassured that she was adequately treated for any bacterial infection. No further abx needed at this time. Did discuss treating for her congestion and runny nose/PND with antihistamines and nasal sprays. Discussed treating symptomatically and that she is  I'm OK with her BP given her age. Keep an eye on her bruise.  She is on Eliquis.  F/u in 1 week if symptoms worsen or fail to improve. Pt voiced understanding and agreement to the plan.  [place], DO 09/27/15 1:55 PM

## 2015-11-29 ENCOUNTER — Other Ambulatory Visit: Payer: Self-pay | Admitting: Cardiovascular Disease

## 2015-12-25 ENCOUNTER — Encounter: Payer: Self-pay | Admitting: Family Medicine

## 2015-12-25 ENCOUNTER — Ambulatory Visit (INDEPENDENT_AMBULATORY_CARE_PROVIDER_SITE_OTHER): Payer: Medicare Other | Admitting: Family Medicine

## 2015-12-25 VITALS — BP 116/62 | HR 63 | Temp 97.7°F | Ht 63.0 in | Wt 165.2 lb

## 2015-12-25 DIAGNOSIS — L989 Disorder of the skin and subcutaneous tissue, unspecified: Secondary | ICD-10-CM | POA: Diagnosis not present

## 2015-12-25 DIAGNOSIS — Z5181 Encounter for therapeutic drug level monitoring: Secondary | ICD-10-CM

## 2015-12-25 DIAGNOSIS — Z8679 Personal history of other diseases of the circulatory system: Secondary | ICD-10-CM

## 2015-12-25 DIAGNOSIS — R600 Localized edema: Secondary | ICD-10-CM

## 2015-12-25 DIAGNOSIS — Z23 Encounter for immunization: Secondary | ICD-10-CM | POA: Diagnosis not present

## 2015-12-25 LAB — COMPREHENSIVE METABOLIC PANEL
ALK PHOS: 85 U/L (ref 39–117)
ALT: 21 U/L (ref 0–35)
AST: 24 U/L (ref 0–37)
Albumin: 3.8 g/dL (ref 3.5–5.2)
BILIRUBIN TOTAL: 0.4 mg/dL (ref 0.2–1.2)
BUN: 29 mg/dL — ABNORMAL HIGH (ref 6–23)
CO2: 30 mEq/L (ref 19–32)
CREATININE: 1.27 mg/dL — AB (ref 0.40–1.20)
Calcium: 9.3 mg/dL (ref 8.4–10.5)
Chloride: 104 mEq/L (ref 96–112)
GFR: 42.1 mL/min — ABNORMAL LOW (ref >60.00)
GLUCOSE: 98 mg/dL (ref 70–99)
Potassium: 4.4 mEq/L (ref 3.5–5.1)
SODIUM: 141 meq/L (ref 135–145)
TOTAL PROTEIN: 6.7 g/dL (ref 6.0–8.3)

## 2015-12-25 LAB — CBC
HCT: 37.3 % (ref 36.0–46.0)
Hemoglobin: 12.3 g/dL (ref 12.0–15.0)
MCHC: 33 g/dL (ref 30.0–36.0)
MCV: 84.7 fl (ref 78.0–100.0)
Platelets: 228 10*3/uL (ref 150.0–400.0)
RBC: 4.4 Mil/uL (ref 3.87–5.11)
RDW: 16.4 % — ABNORMAL HIGH (ref 11.5–15.5)
WBC: 10.3 10*3/uL (ref 4.0–10.5)

## 2015-12-25 MED ORDER — TRIAMCINOLONE ACETONIDE 0.1 % EX CREA: 1.0000 "application " | TOPICAL_CREAM | Freq: Two times a day (BID) | CUTANEOUS | 1 refills | Status: DC

## 2015-12-25 NOTE — Progress Notes (Signed)
Pre visit review using our clinic review tool, if applicable. No additional management support is needed unless otherwise documented below in the visit note. 

## 2015-12-25 NOTE — Progress Notes (Signed)
[name] at [facility] [address] [place], [place] [address] [id number] [phone]  Date:  12/25/2015   Name:  Crystal Kerr   DOB:  September 07, 1924   MRN:  [id number]  PCP:  Lamar Blinks, MD    Chief Complaint: Follow-up (Pt here for 3-4 month follow up. Pt would like cyst on waistline looked at again. )   History of Present Illness:  Crystal Kerr is a 82 y.o. very pleasant female patient who presents with the following:  Here today for a follow-up visit History of A fib, CHF, asthma She is using amiodarone, dilt, eliquis for history of a fib However she has been in NSR for some time now due to her medical treatment as above She sees Dr. Fletcher Anon in [place] for her cardiology care- she really likes him but has moved to the [facility] area so it is a long drive.  She wonders if I can refill these meds for her instead   She has noted some itching on her right waistline.  We have been following a sebaceous cyst in this area for the last few months.  It is not painful, but recently the skin is this area has become red, scaly and itchy Her left index finger will develop some sort of callus periodically- this can be painful. It does not drain.  The finger is not otherwise painful but it does show signs of significant OA.  This callus is not present at this time  Gen's daughter notes that her mom has mild edema of her ankle and wants to make sure this is ok She does not have any history of CHF, most recent echo was normal.  They note that her swelling gets better over night and builds up again during the day  Flu shot it UTD She would like her prenvar 13 shot which is due today Offered zostavax but she does not want this right now   Patient Active Problem List   Diagnosis Date Noted  . Bilateral leg edema 12/19/2013  . 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  Benign paroxysmal positional vertigo 09/13/2013  . Upper airway cough syndrome 07/10/2013  . Allergic rhinitis 07/05/2013  . Persistent atrial fibrillation ([place]) 03/30/2013  . D-dimer, elevated 02/06/2013  . Asthma attack 02/06/2013  . Shortness of breath 02/06/2013  . Acute on chronic diastolic congestive heart failure ([place]) 02/04/2013  . Arthritis of spine ([place]) 07/01/2012  . Intrinsic asthma 07/01/2012  . Dyspnea 04/12/2012  . PVC's (premature ventricular contractions) 04/24/2011  . Murmur, cardiac 04/24/2011    Past Medical History:  Diagnosis Date  . Arthritis   . Asthma   . Atrial fibrillation ([place])   . CHF (congestive heart failure) ([place])   . PVC's (premature ventricular contractions)     Past Surgical History:  Procedure Laterality Date  . APPENDECTOMY    . CARDIOVERSION N/A 03/08/2013   Procedure: CARDIOVERSION;  Surgeon: Larey Dresser, MD;  Location: [place];  Service: Cardiovascular;  Laterality: N/A;  . CHOLECYSTECTOMY    . oophrectomy    . TEE WITHOUT CARDIOVERSION N/A 03/08/2013   Procedure: TRANSESOPHAGEAL ECHOCARDIOGRAM (TEE);  Surgeon: Larey Dresser, MD;  Location: [place];  Service: Cardiovascular;  Laterality: N/A;  . TONSILLECTOMY      Social History  Substance Use Topics  . Smoking status: Former Smoker    Packs/day: 1.00  Years: 20.00    Types: Cigarettes    Quit date: 04/13/1981  . Smokeless tobacco: Never Used  . Alcohol use Yes     Comment: Occasional    Family History  Problem Relation Age of Onset  . Heart disease      No family history  . Heart failure Mother   . Diabetes Father     No Known Allergies  Medication list has been reviewed and updated.  Current Outpatient Prescriptions on File Prior to Visit  Medication Sig Dispense Refill  . acetaminophen (TYLENOL) 500 MG tablet Take 1,000 mg by mouth every 4 (four) hours as needed for moderate pain.    Marland Kitchen albuterol (PROAIR HFA) 108 (90 Base) MCG/ACT inhaler Inhale 2  puffs into the lungs every 4 (four) hours as needed for wheezing or shortness of breath. 18 g 5  . amiodarone (PACERONE) 200 MG tablet TAKE 1 TABLET BY MOUTH DAILY 90 tablet 2  . budesonide-formoterol (SYMBICORT) 160-4.5 MCG/ACT inhaler INHALE 2 PUFFS FIRST THING EVERY MORNING AND 2 PUFFS EVERY 12 HOURS 10.2 g 12  . diltiazem (TIAZAC) 120 MG 24 hr capsule TAKE 1 CAPSULE BY MOUTH DAILY 90 capsule 2  . docusate sodium (COLACE) 100 MG capsule Take 200 mg by mouth daily as needed for mild constipation.    Marland Kitchen ELIQUIS 5 MG TABS tablet TAKE 1 TABLET BY MOUTH TWICE DAILY 60 tablet 3  . famotidine (PEPCID) 20 MG tablet Take 1 tablet (20 mg total) by mouth at bedtime. 30 tablet 11  . montelukast (SINGULAIR) 10 MG tablet Take 1 tablet (10 mg total) by mouth daily. 30 tablet 11  . Multiple Vitamins-Minerals (MULTIVITAMIN WITH MINERALS) tablet Take 1 tablet by mouth daily.    . pantoprazole (PROTONIX) 40 MG tablet TAKE 1 TABLET (40 MG TOTAL) BY MOUTH DAILY. TAKE 30-60 MIN BEFORE FIRST MEAL OF THE DAY 90 tablet 1  . traMADol (ULTRAM) 50 MG tablet Take 1/2 or 1 tablet by mouth every 8 hours as needed for hip pain 40 tablet 0  . triamcinolone ointment (KENALOG) 0.5 % Apply 1 application topically 2 (two) times daily. 30 g 0   No current facility-administered medications on file prior to visit.     Review of Systems:  As per HPI- otherwise negative. Feeling well- no fever, chills, cough, ST, nausea, vomiting or diarrhea  Physical Examination: Vitals:   12/25/15 1302  BP: 116/62  Pulse: 63  Temp: 97.7 F (36.5 C)   Vitals:   12/25/15 1302  Weight: 165 lb 3.2 oz (74.9 kg)  Height: 5\' 3"  (1.6 m)   Body mass index is 29.26 kg/m. Ideal Body Weight: Weight in (lb) to have BMI = 25: 140.8  GEN: WDWN, NAD, Non-toxic, A & O x 3,m overweight, looks well HEENT: Atraumatic, Normocephalic. Neck supple. No masses, No LAD.   Hearing aids bilaterally Bilateral TM wnl, oropharynx normal.  PEERL,EOMI.   Ears  and Nose: No external deformity. CV: RRR- in NSR at this time, No M/G/R. No JVD. No thrill. No extra heart sounds. PULM: CTA B, no wheezes, crackles, rhonchi. No retractions. No resp. distress. No accessory muscle use. ABD: S, NT, ND EXTR: No c/c.  Trace edema at the sock line on her bilateral ankles.  However this is really minimal- reassured that this is likely due to venous insuf and is very common in her age group NEURO Normal gait.  PSYCH: Normally interactive. Conversant. Not depressed or anxious appearing.  Calm demeanor.  At her  right waist line the sebaceous cyst is still present. Still non tender, no redness, no fluctuance.  She has scaly, red skin over the area that appears excoriated but not infected    Assessment and Plan: History of atrial fibrillation - Plan: Comprehensive metabolic panel  Immunization due - Plan: Pneumococcal conjugate vaccine 13-valent IM  Medication monitoring encounter - Plan: Comprehensive metabolic panel, CBC  Skin lesion - Plan: triamcinolone cream (KENALOG) 0.1 %  Pedal edema  Here today to follow-up History of atrial fib, controlled and in NSR with medication.  They would like to know if they can see Dr. Fletcher Anon annually since they have moved farther away.  I will check with him Await labs as above Reassured re: pedal edema, likely due to venous insuf Trial of triamcinolone cream to itchy area on her waistline, let me know if not better   Signed Lamar Blinks, MD

## 2015-12-25 NOTE — Patient Instructions (Addendum)
It was great to see you today as always Your got your pneumonia booster today  We will check basic labs for you today I think that the slight swelling in your ankles is harmless- knee high sock and compression socks may help if this starts to bother you Use the triamcinolone (steroid)cream on your rash 1-2x a day; let me know if not resolved in the next 1-2 weeks  Please come and see me in 4-6 months

## 2015-12-26 ENCOUNTER — Encounter: Payer: Self-pay | Admitting: Family Medicine

## 2015-12-30 ENCOUNTER — Other Ambulatory Visit: Payer: Self-pay | Admitting: Internal Medicine

## 2015-12-31 ENCOUNTER — Telehealth: Payer: Self-pay | Admitting: Emergency Medicine

## 2015-12-31 ENCOUNTER — Other Ambulatory Visit: Payer: Self-pay | Admitting: Family Medicine

## 2015-12-31 NOTE — Telephone Encounter (Signed)
Received [place]. Sent for scanning.

## 2016-01-24 ENCOUNTER — Other Ambulatory Visit: Payer: Self-pay | Admitting: Family Medicine

## 2016-02-03 ENCOUNTER — Other Ambulatory Visit: Payer: Self-pay | Admitting: Family Medicine

## 2016-02-12 ENCOUNTER — Encounter: Payer: Self-pay | Admitting: Family Medicine

## 2016-02-12 ENCOUNTER — Ambulatory Visit (INDEPENDENT_AMBULATORY_CARE_PROVIDER_SITE_OTHER): Payer: Medicare Other | Admitting: Family Medicine

## 2016-02-12 ENCOUNTER — Telehealth: Payer: Self-pay | Admitting: Cardiology

## 2016-02-12 VITALS — BP 122/62 | HR 63 | Temp 97.9°F | Ht 63.0 in | Wt 166.2 lb

## 2016-02-12 DIAGNOSIS — M25552 Pain in left hip: Secondary | ICD-10-CM

## 2016-02-12 DIAGNOSIS — R233 Spontaneous ecchymoses: Secondary | ICD-10-CM

## 2016-02-12 DIAGNOSIS — R238 Other skin changes: Secondary | ICD-10-CM

## 2016-02-12 DIAGNOSIS — Z7901 Long term (current) use of anticoagulants: Secondary | ICD-10-CM

## 2016-02-12 MED ORDER — TRAMADOL HCL 50 MG PO TABS: ORAL_TABLET | ORAL | 1 refills | Status: DC

## 2016-02-12 NOTE — Progress Notes (Signed)
[place] at [facility] [address], [place], [place] [address] [id number] [phone]  Date:  02/12/2016   Name:  Crystal Kerr   DOB:  [birth year]   MRN:  [id number]  PCP:  Lamar Blinks, MD    Chief Complaint: Bleeding/Bruising (c/o bruising more easily than usually. Refill on TraMADol. Flu vaccine done 08/2015.)   History of Present Illness:  Crystal Kerr is a 83 y.o. very pleasant female patient who presents with the following:  Since taking Eliquis she has always been easy to bruise.  During the past month - 1.5 months, has noticed more bruising.  Most on arms, primarily on hands.  Cut finger right after New Year's, it took a long time to stop bleeding.  No change to diet in last month.  Has only been on Eliquis for blood thinners.  Is not taking Aspirin, not taking any headache powders.  No blood in stool or urine, no nose bleeds or hemoptysis  Wants to have a Cardiologist in [place].  She has been seeing Dr. Fletcher Anon and really likes him but has moved to about 5 minutes from the med center- would like to change her cardiologist to this location as the drive to [place] is getting harder She wonders if she can decrease her dose of eliquis which she uses for A fib in hopes that this might decrease her bruising   Also requesting refill of Tramadol.  She uses this for joint pains as needed - has not had a refill in some time.  No other controled medications in use  Patient Active Problem List   Diagnosis Date Noted  . Bilateral leg edema 12/19/2013  . 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 Benign paroxysmal positional vertigo 09/13/2013  . Upper airway cough syndrome 07/10/2013  . Allergic rhinitis 07/05/2013  . Persistent atrial fibrillation ([place]) 03/30/2013  . D-dimer, elevated 02/06/2013  . Asthma attack 02/06/2013  . Shortness of breath 02/06/2013  .  Acute on chronic diastolic congestive heart failure ([place]) 02/04/2013  . Arthritis of spine ([place]) 07/01/2012  . Intrinsic asthma 07/01/2012  . Dyspnea 04/12/2012  . PVC's (premature ventricular contractions) 04/24/2011  . Murmur, cardiac 04/24/2011    Past Medical History:  Diagnosis Date  . Arthritis   . Asthma   . Atrial fibrillation ([place])   . CHF (congestive heart failure) ([place])   . PVC's (premature ventricular contractions)     Past Surgical History:  Procedure Laterality Date  . APPENDECTOMY    . CARDIOVERSION N/A 03/08/2013   Procedure: CARDIOVERSION;  Surgeon: Larey Dresser, MD;  Location: [place];  Service: Cardiovascular;  Laterality: N/A;  . CHOLECYSTECTOMY    . oophrectomy    . TEE WITHOUT CARDIOVERSION N/A 03/08/2013   Procedure: TRANSESOPHAGEAL ECHOCARDIOGRAM (TEE);  Surgeon: Larey Dresser, MD;  Location: [place];  Service: Cardiovascular;  Laterality: N/A;  . TONSILLECTOMY      Social History  Substance Use Topics  . Smoking status: Former Smoker    Packs/day: 1.00    Years: 20.00    Types: Cigarettes    Quit date: 04/13/1981  . Smokeless tobacco: Never Used  . Alcohol use Yes     Comment: Occasional    Family History  Problem Relation Age of Onset  . Heart disease      No family history  . Heart failure Mother   . Diabetes  Father     No Known Allergies  Medication list has been reviewed and updated.  Current Outpatient Prescriptions on File Prior to Visit  Medication Sig Dispense Refill  . acetaminophen (TYLENOL) 500 MG tablet Take 1,000 mg by mouth every 4 (four) hours as needed for moderate pain.    Marland Kitchen albuterol (PROAIR HFA) 108 (90 Base) MCG/ACT inhaler Inhale 2 puffs into the lungs every 4 (four) hours as needed for wheezing or shortness of breath. 18 g 5  . amiodarone (PACERONE) 200 MG tablet TAKE 1 TABLET BY MOUTH DAILY 90 tablet 2  . budesonide-formoterol (SYMBICORT) 160-4.5 MCG/ACT inhaler INHALE 2 PUFFS FIRST THING EVERY  MORNING AND 2 PUFFS EVERY 12 HOURS 10.2 g 12  . diltiazem (TIAZAC) 120 MG 24 hr capsule TAKE 1 CAPSULE BY MOUTH DAILY 90 capsule 2  . docusate sodium (COLACE) 100 MG capsule Take 200 mg by mouth daily as needed for mild constipation.    Marland Kitchen ELIQUIS 5 MG TABS tablet TAKE 1 TABLET BY MOUTH TWICE DAILY 60 tablet 3  . famotidine (PEPCID) 20 MG tablet TAKE 1 TABLET BY MOUTH EVERY NIGHT AT BEDTIME 30 tablet 0  . montelukast (SINGULAIR) 10 MG tablet Take 1 tablet (10 mg total) by mouth daily. 30 tablet 11  . Multiple Vitamins-Minerals (MULTIVITAMIN WITH MINERALS) tablet Take 1 tablet by mouth daily.    . pantoprazole (PROTONIX) 40 MG tablet TAKE 1 TABLET BY MOUTH DAILY. TAKE 30-60 MINUTES BEFORE FIRST MEAL OF THE DAY 30 tablet 0  . traMADol (ULTRAM) 50 MG tablet Take 1/2 or 1 tablet by mouth every 8 hours as needed for hip pain 40 tablet 0  . triamcinolone cream (KENALOG) 0.1 % Apply 1 application topically 2 (two) times daily. Apply to rash on side as needed 30 g 1   No current facility-administered medications on file prior to visit.     Review of Systems:  Review of Systems  Constitutional: Negative for chills, diaphoresis, fever, malaise/fatigue and weight loss.  HENT: Positive for hearing loss. Negative for congestion, ear discharge, ear pain, nosebleeds, sinus pain and tinnitus.        Patient wears hearing aids.  Respiratory: Negative for cough, hemoptysis and shortness of breath.   Cardiovascular: Negative for chest pain, palpitations and leg swelling.  Gastrointestinal: Negative for blood in stool and melena.  Musculoskeletal: Positive for back pain and joint pain. Negative for falls and neck pain.       Positive for right shoulder restriction and pain.  Skin: Negative for itching and rash.  Neurological: Negative for weakness.  Endo/Heme/Allergies: Bruises/bleeds easily.       Patient on anticoagulation (Eliquis)  Psychiatric/Behavioral: Negative for depression. The patient is not  nervous/anxious.   All other systems reviewed and are negative.    Physical Examination: Vitals:   02/12/16 1122  BP: 122/62  Pulse: 63  Temp: 97.9 F (36.6 C)   Vitals:   02/12/16 1122  Weight: 166 lb 3.2 oz (75.4 kg)  Height: 5\' 3"  (1.6 m)   Body mass index is 29.44 kg/m. Ideal Body Weight: Weight in (lb) to have BMI = 25: 140.8  Physical Examination: General appearance - alert, well appearing, and in no distress and oriented to person, place, and time Mental status - alert, oriented to person, place, and time Eyes - pupils equal and reactive, extraocular eye movements intact Mouth - mucous membranes moist, pharynx normal without lesions and no bleeding gums Neck - supple, no significant adenopathy Lymphatics - no  palpable lymphadenopathy, no hepatosplenomegaly Chest - clear to auscultation, no wheezes, rales or rhonchi, symmetric air entry Heart - normal rate, regular rhythm, normal S1, S2, no murmurs, rubs, clicks or gallops Abdomen - soft, nontender, nondistended, no masses or organomegaly Neurological - alert, oriented, normal speech, no focal findings or movement disorder noted Musculoskeletal - right shoulder restricted ROM Extremities - peripheral pulses normal, no pedal edema, no clubbing or cyanosis Skin - scattered bruises on arms, with highest concentration on hands.   Assessment and Plan: Easy bruising  Left hip pain - Plan: traMADol (ULTRAM) 50 MG tablet  Anticoagulation adequate   Will check with patient's cardiologist (Dr. Fletcher Anon) about possible decrease of Eliquis dose.  However suspect that a dose decrease will not be appropriate for her as her weight is over 60 kg and her creat is under 1.5.  Advised her that bruising is normal with this medication but asked her to please report any more significant bleeding She would like to change her cardiology care to the medcenter location as she lives around the corner  Refill of Tramadol called in to patient's  pharmacy.  Patient instructions:  It was a pleasure to see you again as always.  I will ask Dr. Fletcher Anon about your blood thinner dose to see if we can decrease this for you If you have any nose bleeds, blood in your stool or other concerns please let me know I will get your cardiology care switched over to the [facility] for convenience   Signed Lamar Blinks, MD

## 2016-02-12 NOTE — Patient Instructions (Signed)
It was a pleasure to see you again as always.  I will ask Dr. Fletcher Anon about your blood thinner dose to see if we can decrease this for you If you have any nose bleeds, blood in your stool or other concerns please let me know I will get your cardiology care switched over to the [place] for convenience

## 2016-02-12 NOTE — Telephone Encounter (Signed)
That's fine

## 2016-02-12 NOTE — Progress Notes (Signed)
Pre visit review using our clinic review tool, if applicable. No additional management support is needed unless otherwise documented below in the visit note. 

## 2016-02-12 NOTE — Telephone Encounter (Signed)
Crystal Kerr would like to switch care from Dr. Fletcher Anon in [place] to [name] in [facility], due to the drive. Patient is no longer able to make the drive.

## 2016-02-13 NOTE — Telephone Encounter (Signed)
Ok with me Crystal Kerr  

## 2016-02-15 ENCOUNTER — Encounter: Payer: Self-pay | Admitting: Family Medicine

## 2016-02-19 ENCOUNTER — Other Ambulatory Visit: Payer: Self-pay | Admitting: Family Medicine

## 2016-03-04 ENCOUNTER — Other Ambulatory Visit: Payer: Self-pay | Admitting: Family Medicine

## 2016-03-05 ENCOUNTER — Other Ambulatory Visit: Payer: Self-pay | Admitting: Emergency Medicine

## 2016-03-05 MED ORDER — PANTOPRAZOLE SODIUM 40 MG PO TBEC: DELAYED_RELEASE_TABLET | ORAL | 3 refills | Status: DC

## 2016-03-25 ENCOUNTER — Other Ambulatory Visit: Payer: Self-pay | Admitting: Cardiovascular Disease

## 2016-03-26 ENCOUNTER — Telehealth: Payer: Self-pay | Admitting: Family Medicine

## 2016-03-26 ENCOUNTER — Telehealth: Payer: Self-pay | Admitting: Cardiovascular Disease

## 2016-03-26 ENCOUNTER — Ambulatory Visit (INDEPENDENT_AMBULATORY_CARE_PROVIDER_SITE_OTHER): Payer: Medicare Other | Admitting: Family Medicine

## 2016-03-26 VITALS — BP 118/68 | Temp 97.8°F | Resp 16 | Ht 63.0 in | Wt 169.0 lb

## 2016-03-26 DIAGNOSIS — R42 Dizziness and giddiness: Secondary | ICD-10-CM | POA: Diagnosis not present

## 2016-03-26 DIAGNOSIS — R319 Hematuria, unspecified: Secondary | ICD-10-CM

## 2016-03-26 LAB — POCT CBC
Granulocyte percent: 60.9 %G (ref 37–80)
HEMATOCRIT: 33.8 % — AB (ref 37.7–47.9)
Hemoglobin: 11.6 g/dL — AB (ref 12.2–16.2)
LYMPH, POC: 2.9 (ref 0.6–3.4)
MCH, POC: 28.3 pg (ref 27–31.2)
MCHC: 34.3 g/dL (ref 31.8–35.4)
MCV: 82.5 fL (ref 80–97)
MID (cbc): 0.9 (ref 0–0.9)
MPV: 7.8 fL (ref 0–99.8)
PLATELET COUNT, POC: 263 10*3/uL (ref 142–424)
POC Granulocyte: 5.9 (ref 2–6.9)
POC LYMPH %: 30.2 % (ref 10–50)
POC MID %: 8.9 %M (ref 0–12)
RBC: 4.1 M/uL (ref 4.04–5.48)
RDW, POC: 15.6 %
WBC: 9.7 10*3/uL (ref 4.6–10.2)

## 2016-03-26 LAB — POCT URINALYSIS DIP (MANUAL ENTRY)
BILIRUBIN UA: NEGATIVE
Glucose, UA: NEGATIVE
Ketones, POC UA: NEGATIVE
Leukocytes, UA: NEGATIVE
NITRITE UA: NEGATIVE
PH UA: 5 (ref 5.0–8.0)
Protein Ur, POC: 100 — AB
Spec Grav, UA: 1.005 (ref 1.030–1.035)
Urobilinogen, UA: 0.2 (ref ?–2.0)

## 2016-03-26 LAB — POC MICROSCOPIC URINALYSIS (UMFC): MUCUS RE: ABSENT

## 2016-03-26 MED ORDER — CEPHALEXIN 500 MG PO CAPS: 500.0000 mg | ORAL_CAPSULE | Freq: Three times a day (TID) | ORAL | 0 refills | Status: DC

## 2016-03-26 NOTE — Telephone Encounter (Signed)
Patient Name: Crystal Kerr  DOB: 03/30/1924    Initial Comment Caller states mother has blood in urine, not seen it, didn't know how much it is. Creatnines have been stable unnormal. Appt is for tomorrow, and she is wondering about waiting for appt or not. No other symptoms.    Nurse Assessment  Nurse: Raphael Gibney, RN, Vera Date/Time (Eastern Time): 03/26/2016 2:03:50 PM  Confirm and document reason for call. If symptomatic, describe symptoms. ---Caller states her sister in law states caller's mother had blood in urine. Creatinine has been abnormal but stable. No fever. Denies pain. Pt takes Eliquis  Does the patient have any new or worsening symptoms? ---Yes  Will a triage be completed? ---Yes  Related visit to physician within the last 2 weeks? ---No  Does the PT have any chronic conditions? (i.e. diabetes, asthma, etc.) ---Yes  List chronic conditions. ---heart problems; abnormal creatinine  Is this a behavioral health or substance abuse call? ---No     Guidelines    Guideline Title Affirmed Question Affirmed Notes  Urine - Blood In Taking Coumadin (warfarin) or other strong blood thinner, or known bleeding disorder (e.g., thrombocytopenia)    Final Disposition User   See Physician within 4 Hours (or PCP triage) Raphael Gibney, RN, Vera    Referrals  GO TO [place] UNDECIDED   Disagree/Comply: Comply

## 2016-03-26 NOTE — Telephone Encounter (Signed)
LMOV to schedule 6 m fu per checkout 04/30/15 needs refills per sharon crespo

## 2016-03-26 NOTE — Progress Notes (Signed)
Crystal Kerr is a 83 y.o. female who presents to Primary Care at [facility] today for hematuria:  1.  Hematuria:  Present for the past 3 days. She describes this being intermitted and first. However for the past day has been fairly steady hematuria. She did pass a clot earlier today. She has never had any bleeding in her urine previously. She has no symptoms of hematochezia or melena. She has no abdominal pain. She does note "gurgling" in her abdomen.  She is on request which she takes daily for stroke prevention as she has a history of atrial fibrillation. She has noted over the past year or so increased bruising on this medicine. She ismedicine for several years. Despite being on this for several years she's never had hematuria previously.  She denies any dysuria. No urinary hesitancy or urgency. She has nocturia 3-4 times a night but this is normal for her. No back pain. No fevers or chills. No nausea or vomiting. No constipation or diarrhea.  No new medicines. She does not take any NSAIDs.  Her daughter is present with her today and corroborates her history. Her daughter also states that she has been a little bit more lightheaded than recently. The patient agrees with this. No falls.  She is status post oophorectomy, cholecystectomy, appendectomy.  Of note daughter also states that her creatinine has been "creeping upwards" for the past several checks.    ROS as above.    PMH reviewed. Patient is a nonsmoker.   Past Medical History:  Diagnosis Date  . Arthritis   . Asthma   . Atrial fibrillation ([place])   . CHF (congestive heart failure) ([place])   . PVC's (premature ventricular contractions)    Past Surgical History:  Procedure Laterality Date  . APPENDECTOMY    . CARDIOVERSION N/A 03/08/2013   Procedure: CARDIOVERSION;  Surgeon: Larey Dresser, MD;  Location: [place];  Service: Cardiovascular;  Laterality: N/A;  . CHOLECYSTECTOMY    . oophrectomy    . TEE WITHOUT  CARDIOVERSION N/A 03/08/2013   Procedure: TRANSESOPHAGEAL ECHOCARDIOGRAM (TEE);  Surgeon: Larey Dresser, MD;  Location: [place];  Service: Cardiovascular;  Laterality: N/A;  . TONSILLECTOMY      Medications reviewed. Current Outpatient Prescriptions  Medication Sig Dispense Refill  . acetaminophen (TYLENOL) 500 MG tablet Take 1,000 mg by mouth every 4 (four) hours as needed for moderate pain.    Marland Kitchen albuterol (PROAIR HFA) 108 (90 Base) MCG/ACT inhaler Inhale 2 puffs into the lungs every 4 (four) hours as needed for wheezing or shortness of breath. 18 g 5  . amiodarone (PACERONE) 200 MG tablet TAKE 1 TABLET BY MOUTH DAILY 90 tablet 2  . budesonide-formoterol (SYMBICORT) 160-4.5 MCG/ACT inhaler INHALE 2 PUFFS FIRST THING EVERY MORNING AND 2 PUFFS EVERY 12 HOURS 10.2 g 12  . diltiazem (TIAZAC) 120 MG 24 hr capsule TAKE 1 CAPSULE BY MOUTH DAILY 90 capsule 2  . docusate sodium (COLACE) 100 MG capsule Take 200 mg by mouth daily as needed for mild constipation.    Marland Kitchen ELIQUIS 5 MG TABS tablet TAKE 1 TABLET BY MOUTH TWICE DAILY 60 tablet 3  . famotidine (PEPCID) 20 MG tablet TAKE 1 TABLET BY MOUTH EVERY NIGHT AT BEDTIME 30 tablet 0  . montelukast (SINGULAIR) 10 MG tablet TAKE 1 TABLET BY MOUTH EVERY DAY 30 tablet 0  . Multiple Vitamins-Minerals (MULTIVITAMIN WITH MINERALS) tablet Take 1 tablet by mouth daily.    . pantoprazole (PROTONIX) 40 MG tablet  TAKE 1 TABLET BY MOUTH DAILY. TAKE 30-60 MINUTES BEFORE FIRST MEAL OF THE DAY 30 tablet 3  . traMADol (ULTRAM) 50 MG tablet Take 1/2 or 1 tablet by mouth every 8 hours as needed for hip pain 60 tablet 1  . triamcinolone cream (KENALOG) 0.1 % Apply 1 application topically 2 (two) times daily. Apply to rash on side as needed 30 g 1   No current facility-administered medications for this visit.      Physical Exam:  BP 118/68   Temp 97.8 F (36.6 C) (Oral)   Resp 16   Ht 5\' 3"  (1.6 m)   Wt 169 lb (76.7 kg)   SpO2 94%   BMI 29.94 kg/m  Gen:   Alert, cooperative patient who appears stated age in no acute distress.  Vital signs reviewed. HEENT: EOMI,  MMM.  Minimall pallor.  Pulm:  Normal work of breathing. She does have some mild rhonchi with deep inspiration at the bases of her lungs bilaterally. Cardiac:  Regular rate and rhythm with Grade II murmur noted  Abd:  Soft/nondistended.  Does have some mild right lower quadrant tenderness on deep palpation. No suprapubic tenderness. She does have active bowel sounds. Surgical scar noted right upper quadrant consistent with prior open cholecystectomy. Also laparoscopic scars noted which are well-healed. She also has a surgical scar lower right abdomen midclavicular line. Skin: No infection or rash. Exts: +1 edema bilateral lower ankles  Results for orders placed or performed in visit on 03/26/16  POCT Microscopic Urinalysis ([facility])  Result Value Ref Range   WBC,UR,HPF,POC None None WBC/hpf   RBC,UR,HPF,POC Too numerous to count  (A) None RBC/hpf   Bacteria None None, Too numerous to count   Mucus Absent Absent   Epithelial Cells, UR Per Microscopy Few (A) None, Too numerous to count cells/hpf  POCT urinalysis dipstick  Result Value Ref Range   Color, UA brown (A) yellow   Clarity, UA clear clear   Glucose, UA negative negative   Bilirubin, UA negative negative   Ketones, POC UA negative negative   Spec Grav, UA 1.005 1.030 - 1.035   Blood, UA large (A) negative   pH, UA 5.0 5.0 - 8.0   Protein Ur, POC =100 (A) negative   Urobilinogen, UA 0.2 Negative - 2.0   Nitrite, UA Negative Negative   Leukocytes, UA Negative Negative  POCT CBC  Result Value Ref Range   WBC 9.7 4.6 - 10.2 K/uL   Lymph, poc 2.9 0.6 - 3.4   POC LYMPH PERCENT 30.2 10 - 50 %L   MID (cbc) 0.9 0 - 0.9   POC MID % 8.9 0 - 12 %M   POC Granulocyte 5.9 2 - 6.9   Granulocyte percent 60.9 37 - 80 %G   RBC 4.10 4.04 - 5.48 M/uL   Hemoglobin 11.6 (A) 12.2 - 16.2 g/dL   HCT, POC 33.8 (A) 37.7 - 47.9 %   MCV 82.5  80 - 97 fL   MCH, POC 28.3 27 - 31.2 pg   MCHC 34.3 31.8 - 35.4 g/dL   RDW, POC 15.6 %   Platelet Count, POC 263 142 - 424 K/uL   MPV 7.8 0 - 99.8 fL    Assessment and Plan:  1.  Hematuria: -Gross hematuria. Corroborated on UA here. -We are going to ultimately treat as UTI. - no back pain or evidence of nephrolithiasis.  -However she is on Eliquis and this could be from bleeding not infectious in  origin. -If persists despite antibiotics she needs to return. Will need urologic referral at that point. -It completely resolved will need follow-up urinalysis in the next 2 weeks to ensure no microscopic hematuria. If persistent recommended urology follow-up at that point.

## 2016-03-26 NOTE — Patient Instructions (Addendum)
We are going to treat the blood in your urine as a urinary tract infection. Take the Keflex 3 times a day for the next 7 days.  If you notice increasing bleeding despite the antibiotics in the next several days come back and see [place]. If you start noticing worsening lightheadedness come back and see [place].  You should come back and see [place] or your primary care provider regardless in the next 2 weeks to have a repeat urine test to ensure that all the blood is out of your urine.  It was great to meet you today.    IF you received an x-ray today, you will receive an invoice from [facility]. Please contact [facility] at [phone] with questions or concerns regarding your invoice.   IF you received labwork today, you will receive an invoice from [facility]. Please contact [facility] at [phone] with questions or concerns regarding your invoice.   Our billing staff will not be able to assist you with questions regarding bills from these companies.  You will be contacted with the lab results as soon as they are available. The fastest way to get your results is to activate your My Chart account. Instructions are located on the last page of this paperwork. If you have not heard from [place] regarding the results in 2 weeks, please contact this office.

## 2016-03-27 ENCOUNTER — Ambulatory Visit: Payer: Medicare Other | Admitting: Family

## 2016-03-27 ENCOUNTER — Other Ambulatory Visit: Payer: Self-pay | Admitting: *Deleted

## 2016-03-27 LAB — COMPREHENSIVE METABOLIC PANEL
A/G RATIO: 1.6 (ref 1.2–2.2)
ALBUMIN: 3.8 g/dL (ref 3.5–4.7)
ALK PHOS: 110 IU/L (ref 39–117)
ALT: 24 IU/L (ref 0–32)
AST: 30 IU/L (ref 0–40)
BUN / CREAT RATIO: 22 (ref 12–28)
BUN: 25 mg/dL (ref 8–27)
Bilirubin Total: 0.2 mg/dL (ref 0.0–1.2)
CO2: 24 mmol/L (ref 18–29)
CREATININE: 1.13 mg/dL — AB (ref 0.57–1.00)
Calcium: 9 mg/dL (ref 8.7–10.3)
Chloride: 100 mmol/L (ref 96–106)
GFR calc Af Amer: 50 mL/min/{1.73_m2} — ABNORMAL LOW (ref >59)
GFR, EST NON AFRICAN AMERICAN: 43 mL/min/{1.73_m2} — AB (ref >59)
GLOBULIN, TOTAL: 2.4 g/dL (ref 1.5–4.5)
Glucose: 74 mg/dL (ref 65–99)
POTASSIUM: 4.7 mmol/L (ref 3.5–5.2)
SODIUM: 137 mmol/L (ref 134–144)
Total Protein: 6.2 g/dL (ref 6.0–8.5)

## 2016-03-27 LAB — PROTIME-INR
INR: 1 (ref 0.8–1.2)
Prothrombin Time: 10.9 s (ref 9.1–12.0)

## 2016-03-27 MED ORDER — APIXABAN 5 MG PO TABS: 5.0000 mg | ORAL_TABLET | Freq: Two times a day (BID) | ORAL | 0 refills | Status: DC

## 2016-03-27 MED ORDER — APIXABAN 5 MG PO TABS: 5.0000 mg | ORAL_TABLET | Freq: Two times a day (BID) | ORAL | 3 refills | Status: DC

## 2016-03-27 NOTE — Telephone Encounter (Signed)
Spoke with patients daughter and sent in one refill and appointment is scheduled for 04/08/16 with [name]. She was appreciative for the call back and had no further questions at this time.

## 2016-03-27 NOTE — Telephone Encounter (Signed)
Attempted to schedule fu with [name] Patients daughter. Per previous phone note patient lives in [facility] and is approved to start seeing [name]  Daughter would like to know if Crystal Kerr can refill Eliquis for patient this time as she has 6 days left and needs to schedule an appt in [place].

## 2016-03-27 NOTE — Telephone Encounter (Signed)
Patients daughter called and stated that if the eliquis is not refilled today the patient will be out of medication. She stated that the issue was that she needed an appointment and she has already scheduled this.

## 2016-03-27 NOTE — Telephone Encounter (Signed)
Can you please advise if okay to refill since Dr. Fletcher Anon did not prescribe.

## 2016-03-28 LAB — URINE CULTURE

## 2016-03-29 ENCOUNTER — Other Ambulatory Visit: Payer: Self-pay | Admitting: Physician Assistant

## 2016-04-01 ENCOUNTER — Telehealth: Payer: Self-pay | Admitting: Family Medicine

## 2016-04-01 NOTE — Telephone Encounter (Signed)
Called and discussed negative urine culture.  Spoke with patient daughter.  Patient is doing much improved since initiation of antibiotics.  No further hematuria.  They are planning to FU with regular PCP for hematuria recheck in a week or so.

## 2016-04-03 NOTE — Progress Notes (Signed)
HPI: FU atrial fibrillation; first office visit with me; previously followed by Dr Fletcher Anon. Patient had episode of atrial fibrillation in February 2015 in the setting of viral gastroenteritis. She had a TEE guided cardioversion and has maintained sinus rhythm on amiodarone. TEE February 2015 showed normal LV function, lambl's excrescence, mild to moderate left atrial enlargement. In reviewing records patient had an abdominal CT on 05/23/2015 that showed 2 small 1 cm lesions in the upper pole of the left kidney and abdominal MRI recommended. There were also nodules in the urinary bladder suspicious for multifocal bladder carcinoma. She was recently seen her primary care for hematuria. Most recent laboratories March 2018 showed BUN 25, creatinine 1.13, Hgb 12.3 and normal liver functions. Since she was last seen by cardiology, she denies chest pain, palpitations or syncope. Mild dyspnea on exertion.  Current Outpatient Prescriptions  Medication Sig Dispense Refill  . acetaminophen (TYLENOL) 500 MG tablet Take 1,000 mg by mouth every 4 (four) hours as needed for moderate pain.    Marland Kitchen albuterol (PROAIR HFA) 108 (90 Base) MCG/ACT inhaler Inhale 2 puffs into the lungs every 4 (four) hours as needed for wheezing or shortness of breath. 18 g 5  . amiodarone (PACERONE) 200 MG tablet TAKE 1 TABLET BY MOUTH DAILY 90 tablet 2  . apixaban (ELIQUIS) 5 MG TABS tablet Take 1 tablet (5 mg total) by mouth 2 (two) times daily. 60 tablet 0  . budesonide-formoterol (SYMBICORT) 160-4.5 MCG/ACT inhaler INHALE 2 PUFFS FIRST THING EVERY MORNING AND 2 PUFFS EVERY 12 HOURS 10.2 g 12  . cephALEXin (KEFLEX) 500 MG capsule Take 1 capsule (500 mg total) by mouth 3 (three) times daily. X 7 days 21 capsule 0  . diltiazem (TIAZAC) 120 MG 24 hr capsule TAKE 1 CAPSULE BY MOUTH DAILY 90 capsule 2  . docusate sodium (COLACE) 100 MG capsule Take 200 mg by mouth daily as needed for mild constipation.    . famotidine (PEPCID) 20 MG tablet  TAKE 1 TABLET BY MOUTH EVERY NIGHT AT BEDTIME 30 tablet 0  . montelukast (SINGULAIR) 10 MG tablet TAKE 1 TABLET BY MOUTH EVERY DAY 30 tablet 0  . Multiple Vitamins-Minerals (MULTIVITAMIN WITH MINERALS) tablet Take 1 tablet by mouth daily.    . pantoprazole (PROTONIX) 40 MG tablet TAKE 1 TABLET BY MOUTH DAILY. TAKE 30-60 MINUTES BEFORE FIRST MEAL OF THE DAY 30 tablet 3  . traMADol (ULTRAM) 50 MG tablet Take 1/2 or 1 tablet by mouth every 8 hours as needed for hip pain 60 tablet 1  . triamcinolone cream (KENALOG) 0.1 % Apply 1 application topically 2 (two) times daily. Apply to rash on side as needed 30 g 1   No current facility-administered medications for this visit.      Past Medical History:  Diagnosis Date  . Arthritis   . Asthma   . Atrial fibrillation ([employer])   . CHF (congestive heart failure) ([place])   . PVC's (premature ventricular contractions)     Past Surgical History:  Procedure Laterality Date  . APPENDECTOMY    . CARDIOVERSION N/A 03/08/2013   Procedure: CARDIOVERSION;  Surgeon: Larey Dresser, MD;  Location: [place];  Service: Cardiovascular;  Laterality: N/A;  . CHOLECYSTECTOMY    . oophrectomy    . TEE WITHOUT CARDIOVERSION N/A 03/08/2013   Procedure: TRANSESOPHAGEAL ECHOCARDIOGRAM (TEE);  Surgeon: Larey Dresser, MD;  Location: [place];  Service: Cardiovascular;  Laterality: N/A;  . TONSILLECTOMY      Social  History   Social History  . Marital status: Unknown    Spouse name: N/A  . Number of children: N/A  . Years of education: N/A   Occupational History  . Retired Retired   Social History Main Topics  . Smoking status: Former Smoker    Packs/day: 1.00    Years: 20.00    Types: Cigarettes    Quit date: 04/13/1981  . Smokeless tobacco: Never Used  . Alcohol use Yes     Comment: Occasional  . Drug use: No  . Sexual activity: No   Other Topics Concern  . Not on file   Social History Narrative  . No narrative on file    Family History    Problem Relation Age of Onset  . Heart failure Mother   . Diabetes Father   . Heart disease      No family history    ROS: Recent hematuria but no fevers or chills, productive cough, hemoptysis, dysphasia, odynophagia, melena, hematochezia, dysuria, rash, seizure activity, orthopnea, PND, pedal edema, claudication. Remaining systems are negative.  Physical Exam: Well-developed obese in no acute distress.  Skin is warm and dry.  HEENT is normal.  Neck is supple.  Chest is clear to auscultation with normal expansion.  Cardiovascular exam is regular rate and rhythm.  Abdominal exam nontender or distended. No masses palpated. Extremities show no edema. neuro grossly intact  ECG- Sinus rhythm at a rate of 56. First-degree AV block. No ST changes. personally reviewed  A/P  1 paroxysmal atrial fibrillation-patient remains in sinus rhythm. Continue amiodarone. Check chest x-ray. Recent liver functions normal. Check TSH. Recent creatinine 1.13 and hemoglobin 11.6. Continue apixaban. CHADSvasc 4.  2 elevated blood pressure-blood pressure mildly elevated today. However she states typically controlled. We will continue present medications and follow.  3 hematuria-recent bout of hematuria. Treated with antibiotics and resolved; however, previous CT showed lesions in left kidney and urinary bladder; may need further evaluation such as urology or MRI. Will ask primary care to review. If hematuria recurs, apixaban could be held as needed.  Kirk Ruths, MD

## 2016-04-08 ENCOUNTER — Encounter: Payer: Self-pay | Admitting: Cardiology

## 2016-04-08 ENCOUNTER — Ambulatory Visit (HOSPITAL_BASED_OUTPATIENT_CLINIC_OR_DEPARTMENT_OTHER)
Admission: RE | Admit: 2016-04-08 | Discharge: 2016-04-08 | Disposition: A | Discharge status: Home / Self Care | Payer: Medicare Other | Source: Ambulatory Visit | Attending: Cardiology | Admitting: Cardiology

## 2016-04-08 ENCOUNTER — Ambulatory Visit (INDEPENDENT_AMBULATORY_CARE_PROVIDER_SITE_OTHER): Payer: Medicare Other | Admitting: Cardiology

## 2016-04-08 ENCOUNTER — Telehealth: Payer: Self-pay | Admitting: Family Medicine

## 2016-04-08 VITALS — BP 164/68 | HR 56 | Ht 63.0 in | Wt 168.1 lb

## 2016-04-08 DIAGNOSIS — I4891 Unspecified atrial fibrillation: Secondary | ICD-10-CM | POA: Insufficient documentation

## 2016-04-08 DIAGNOSIS — R030 Elevated blood-pressure reading, without diagnosis of hypertension: Secondary | ICD-10-CM | POA: Diagnosis not present

## 2016-04-08 DIAGNOSIS — R310 Gross hematuria: Secondary | ICD-10-CM | POA: Diagnosis not present

## 2016-04-08 DIAGNOSIS — I700 Atherosclerosis of aorta: Secondary | ICD-10-CM | POA: Diagnosis not present

## 2016-04-08 DIAGNOSIS — Z79899 Other long term (current) drug therapy: Secondary | ICD-10-CM | POA: Diagnosis present

## 2016-04-08 NOTE — Telephone Encounter (Signed)
error 

## 2016-04-08 NOTE — Patient Instructions (Signed)
Medication Instructions:   NO CHANGE  Labwork:  Your physician recommends that you HAVE LAB WORK TODAY  Testing/Procedures:  A chest x-ray takes a picture of the organs and structures inside the chest, including the heart, lungs, and blood vessels. This test can show several things, including, whether the heart is enlarges; whether fluid is building up in the lungs; and whether pacemaker / defibrillator leads are still in place.   Follow-Up:  Your physician wants you to follow-up in: 6 MONTHS WITH DR CRENSHAW You will receive a reminder letter in the mail two months in advance. If you don't receive a letter, please call our office to schedule the follow-up appointment.   If you need a refill on your cardiac medications before your next appointment, please call your pharmacy.    

## 2016-04-09 ENCOUNTER — Ambulatory Visit (INDEPENDENT_AMBULATORY_CARE_PROVIDER_SITE_OTHER): Payer: Medicare Other | Admitting: Family Medicine

## 2016-04-09 VITALS — BP 122/62 | HR 60 | Temp 98.4°F | Ht 63.0 in | Wt 169.6 lb

## 2016-04-09 DIAGNOSIS — D494 Neoplasm of unspecified behavior of bladder: Secondary | ICD-10-CM | POA: Diagnosis not present

## 2016-04-09 DIAGNOSIS — N289 Disorder of kidney and ureter, unspecified: Secondary | ICD-10-CM | POA: Diagnosis not present

## 2016-04-09 LAB — TSH: TSH: 1.94 u[IU]/mL (ref 0.450–4.500)

## 2016-04-09 NOTE — Progress Notes (Signed)
[place] [facility] [address], [place], [place] [address] [phone]  Date:  04/09/2016   Name:  Crystal Kerr   DOB:  10-12-1924   MRN:  [id number]  PCP:  Lamar Blinks, MD    Chief Complaint: Follow-up (Pt here for follow up visit. )   History of Present Illness:  Crystal Kerr is a 83 y.o. very pleasant female patient who presents with the following:  Here today for a follow-up visit.  She recently saw her new cardiologist, [name].  He noticed a concerning finding on a CT from last May which I had not been aware of and asked her to come and see me.    CT abd/ pelvis from ER visit 05/2015  IMPRESSION: No evidence of internal organ injury or hemoperitoneum.  Two small approximately 1 cm lesions in upper pole of left kidney, which are indeterminate and could represent complex cysts or solid enhancing neoplasms. Nonemergent abdomen MRI without and with contrast recommended for further characterization.  Multiple small enhancing mural nodules within the urinary bladder measuring up to 9 mm, suspicious for multifocal bladder carcinoma. Recommend urology consultation and cystoscopy.  Large stool burden noted; suggest clinical correlation for possible constipation.  She was seen earlier this month by Dr. Mingo Amber for hematuria.  She had a negative urine culture at that visit.  She notes that her bladder sx did seem to resolve with the abx that he gave her-  At this time she is feeling quite well. She does tend to feel a bit tired but thinks this is normal for her age No weight loss, night sweats or fevers  Wt Readings from Last 3 Encounters:  04/09/16 169 lb 9.6 oz (76.9 kg)  04/08/16 168 lb 1.9 oz (76.3 kg)  03/26/16 169 lb (76.7 kg)   She did smoke years ago  Patient Active Problem List   Diagnosis Date Noted  . Bilateral leg edema 12/19/2013  . Deafness or hearing loss of type  classifiable to 389.0 with type classifiable to 389.1 10/27/2013  . Benign paroxysmal positional vertigo 09/13/2013  . Upper airway cough syndrome 07/10/2013  . Allergic rhinitis 07/05/2013  . Persistent atrial fibrillation ([place]) 03/30/2013  . Shortness of breath 02/06/2013  . Acute on chronic diastolic congestive heart failure ([place]) 02/04/2013  . Arthritis of spine ([place]) 07/01/2012  . Intrinsic asthma 07/01/2012  . Dyspnea 04/12/2012  . PVC's (premature ventricular contractions) 04/24/2011  . Murmur, cardiac 04/24/2011    Past Medical History:  Diagnosis Date  . Arthritis   . Asthma   . Atrial fibrillation ([place])   . CHF (congestive heart failure) ([place])   . PVC's (premature ventricular contractions)     Past Surgical History:  Procedure Laterality Date  . APPENDECTOMY    . CARDIOVERSION N/A 03/08/2013   Procedure: CARDIOVERSION;  Surgeon: Larey Dresser, MD;  Location: [place];  Service: Cardiovascular;  Laterality: N/A;  . CHOLECYSTECTOMY    . oophrectomy    . TEE WITHOUT CARDIOVERSION N/A 03/08/2013   Procedure: TRANSESOPHAGEAL ECHOCARDIOGRAM (TEE);  Surgeon: Larey Dresser, MD;  Location: [place];  Service: Cardiovascular;  Laterality: N/A;  . TONSILLECTOMY      Social History  Substance Use Topics  . Smoking status: Former Smoker    Packs/day: 1.00    Years: 20.00    Types: Cigarettes    Quit date: 04/13/1981  . Smokeless tobacco: Never Used  . Alcohol use  Yes     Comment: Occasional    Family History  Problem Relation Age of Onset  . Heart failure Mother   . Diabetes Father   . Heart disease      No family history    No Known Allergies  Medication list has been reviewed and updated.  Current Outpatient Prescriptions on File Prior to Visit  Medication Sig Dispense Refill  . acetaminophen (TYLENOL) 500 MG tablet Take 1,000 mg by mouth every 4 (four) hours as needed for moderate pain.    Marland Kitchen albuterol (PROAIR HFA) 108 (90 Base) MCG/ACT inhaler  Inhale 2 puffs into the lungs every 4 (four) hours as needed for wheezing or shortness of breath. 18 g 5  . amiodarone (PACERONE) 200 MG tablet TAKE 1 TABLET BY MOUTH DAILY 90 tablet 2  . apixaban (ELIQUIS) 5 MG TABS tablet Take 1 tablet (5 mg total) by mouth 2 (two) times daily. 60 tablet 0  . budesonide-formoterol (SYMBICORT) 160-4.5 MCG/ACT inhaler INHALE 2 PUFFS FIRST THING EVERY MORNING AND 2 PUFFS EVERY 12 HOURS 10.2 g 12  . diltiazem (TIAZAC) 120 MG 24 hr capsule TAKE 1 CAPSULE BY MOUTH DAILY 90 capsule 2  . docusate sodium (COLACE) 100 MG capsule Take 200 mg by mouth daily as needed for mild constipation.    . famotidine (PEPCID) 20 MG tablet TAKE 1 TABLET BY MOUTH EVERY NIGHT AT BEDTIME 30 tablet 0  . montelukast (SINGULAIR) 10 MG tablet TAKE 1 TABLET BY MOUTH EVERY DAY 30 tablet 0  . Multiple Vitamins-Minerals (MULTIVITAMIN WITH MINERALS) tablet Take 1 tablet by mouth daily.    . pantoprazole (PROTONIX) 40 MG tablet TAKE 1 TABLET BY MOUTH DAILY. TAKE 30-60 MINUTES BEFORE FIRST MEAL OF THE DAY 30 tablet 3  . traMADol (ULTRAM) 50 MG tablet Take 1/2 or 1 tablet by mouth every 8 hours as needed for hip pain 60 tablet 1  . triamcinolone cream (KENALOG) 0.1 % Apply 1 application topically 2 (two) times daily. Apply to rash on side as needed 30 g 1   No current facility-administered medications on file prior to visit.     Review of Systems:  As per HPI- otherwise negative.   Physical Examination: Vitals:   04/09/16 1509  BP: 122/62  Pulse: 60  Temp: 98.4 F (36.9 C)   Vitals:   04/09/16 1509  Weight: 169 lb 9.6 oz (76.9 kg)  Height: 5\' 3"  (1.6 m)   Body mass index is 30.04 kg/m. Ideal Body Weight: Weight in (lb) to have BMI = 25: 140.8  GEN: WDWN, NAD, Non-toxic, A & O x 3, accompanied by her daughter [name] today. Looks well and her normal self  HEENT: Atraumatic, Normocephalic. Neck supple. No masses, No LAD. Ears and Nose: No external deformity. CV: RRR, No M/G/R. No  JVD. No thrill. No extra heart sounds. PULM: CTA B, no wheezes, crackles, rhonchi. No retractions. No resp. distress. No accessory muscle use. ABD: S, NT, ND, benign belly EXTR: No c/c/e NEURO Normal gait.  PSYCH: Normally interactive. Conversant. Not depressed or anxious appearing.  Calm demeanor.    Assessment and Plan: Renal lesion - Plan: Ambulatory referral to Urology  Bladder tumor - Plan: Ambulatory referral to Urology  Here today to discuss episode of gross hematuria with negative urine culture and concerning CT findings from last year as above.  Discussed in detail with pt and her daughter, invited and answered all questions. [facility] in [place] kindly agreed to see this pt on  Tuesday and I discussed her case briefly on the phone with Dr. Thomasene Mohair.  We appreciate urology care of this nice patient.    Signed Lamar Blinks, MD Tuesday at 1:30, Dr. Thomasene Mohair  [phone] urology fax number

## 2016-04-09 NOTE — Patient Instructions (Signed)
It was good to see you today- I am glad that you are feeling well! You have a urology appointment on Tuesday at the [facility] office in [place]. [place], [place] [address]. Phone: [phone] Fax: [phone] Please arrive at 1:00 to see Dr. Thomasene Mohair

## 2016-04-14 DIAGNOSIS — R319 Hematuria, unspecified: Secondary | ICD-10-CM | POA: Diagnosis not present

## 2016-04-14 DIAGNOSIS — Z6828 Body mass index (BMI) 28.0-28.9, adult: Secondary | ICD-10-CM | POA: Diagnosis not present

## 2016-04-18 ENCOUNTER — Other Ambulatory Visit: Payer: Self-pay | Admitting: Cardiovascular Disease

## 2016-04-18 ENCOUNTER — Other Ambulatory Visit: Payer: Self-pay | Admitting: Family Medicine

## 2016-04-20 DIAGNOSIS — N281 Cyst of kidney, acquired: Secondary | ICD-10-CM | POA: Diagnosis not present

## 2016-04-20 NOTE — Telephone Encounter (Signed)
Refill Request.  

## 2016-04-24 ENCOUNTER — Other Ambulatory Visit: Payer: Self-pay | Admitting: Family Medicine

## 2016-05-04 ENCOUNTER — Ambulatory Visit (INDEPENDENT_AMBULATORY_CARE_PROVIDER_SITE_OTHER): Payer: Medicare Other | Admitting: Family Medicine

## 2016-05-04 ENCOUNTER — Encounter: Payer: Self-pay | Admitting: Family Medicine

## 2016-05-04 VITALS — BP 122/60 | HR 63 | Temp 98.0°F | Ht 63.0 in | Wt 168.8 lb

## 2016-05-04 DIAGNOSIS — C679 Malignant neoplasm of bladder, unspecified: Secondary | ICD-10-CM

## 2016-05-04 DIAGNOSIS — Z719 Counseling, unspecified: Secondary | ICD-10-CM

## 2016-05-04 NOTE — Progress Notes (Signed)
[place] [facility] [address] [place], [place] [address] [phone]  Date:  05/04/2016   Name:  Crystal Kerr   DOB:  [birth year]   MRN:  [id number]  PCP:  Lamar Blinks, MD    Chief Complaint: Follow-up (Consultation surgery)   History of Present Illness:  Crystal Kerr is a 82 y.o. very pleasant female patient who presents with the following:  Here today to disucss her recent dx of bladder cancer.   She is seeing Dr. Fenton Malling with [facility] urology.  They plan to do an outpt surgical procedure for her cancer next week and she has some questions about this.  She is not sure if the cancer will harm her during her lifespan and is not sure if she wants to go through with it.    Discussed with patient and her daughter who is with her today.  Explained that we cannot know for sure if this cancer will ever cause further complications.  However given that the treatment is fairly benign it is likely worth having treatment.  She would like to go ahead with this.  She is not sure if she can stop her eliquis prior to her procedure- I will consult [name] for her   Patient Active Problem List   Diagnosis Date Noted  . Bilateral leg edema 12/19/2013  . 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 Benign paroxysmal positional vertigo 09/13/2013  . Upper airway cough syndrome 07/10/2013  . Allergic rhinitis 07/05/2013  . Persistent atrial fibrillation ([place]) 03/30/2013  . Shortness of breath 02/06/2013  . Acute on chronic diastolic congestive heart failure ([place]) 02/04/2013  . Arthritis of spine ([place]) 07/01/2012  . Intrinsic asthma 07/01/2012  . Dyspnea 04/12/2012  . PVC's (premature ventricular contractions) 04/24/2011  . Murmur, cardiac 04/24/2011    Past Medical History:  Diagnosis Date  . Arthritis   . Asthma   . Atrial fibrillation ([place])   . CHF  (congestive heart failure) ([place])   . PVC's (premature ventricular contractions)     Past Surgical History:  Procedure Laterality Date  . APPENDECTOMY    . CARDIOVERSION N/A 03/08/2013   Procedure: CARDIOVERSION;  Surgeon: Larey Dresser, MD;  Location: [place];  Service: Cardiovascular;  Laterality: N/A;  . CHOLECYSTECTOMY    . oophrectomy    . TEE WITHOUT CARDIOVERSION N/A 03/08/2013   Procedure: TRANSESOPHAGEAL ECHOCARDIOGRAM (TEE);  Surgeon: Larey Dresser, MD;  Location: [place];  Service: Cardiovascular;  Laterality: N/A;  . TONSILLECTOMY      Social History  Substance Use Topics  . Smoking status: Former Smoker    Packs/day: 1.00    Years: 20.00    Types: Cigarettes    Quit date: 04/13/1981  . Smokeless tobacco: Never Used  . Alcohol use Yes     Comment: Occasional    Family History  Problem Relation Age of Onset  . Heart failure Mother   . Diabetes Father   . Heart disease      No family history    No Known Allergies  Medication list has been reviewed and updated.  Current Outpatient Prescriptions on File Prior to Visit  Medication Sig Dispense Refill  . acetaminophen (TYLENOL) 500 MG tablet Take 1,000 mg by mouth every 4 (four) hours as needed for moderate pain.    Marland Kitchen albuterol (PROAIR HFA) 108 (90 Base) MCG/ACT inhaler  Inhale 2 puffs into the lungs every 4 (four) hours as needed for wheezing or shortness of breath. 18 g 5  . amiodarone (PACERONE) 200 MG tablet TAKE 1 TABLET BY MOUTH DAILY 90 tablet 1  . apixaban (ELIQUIS) 5 MG TABS tablet Take 1 tablet (5 mg total) by mouth 2 (two) times daily. 60 tablet 0  . budesonide-formoterol (SYMBICORT) 160-4.5 MCG/ACT inhaler INHALE 2 PUFFS FIRST THING EVERY MORNING AND 2 PUFFS EVERY 12 HOURS 10.2 g 12  . diltiazem (TIAZAC) 120 MG 24 hr capsule TAKE 1 CAPSULE BY MOUTH DAILY 90 capsule 2  . docusate sodium (COLACE) 100 MG capsule Take 200 mg by mouth daily as needed for mild constipation.    . famotidine (PEPCID)  20 MG tablet TAKE 1 TABLET BY MOUTH EVERY NIGHT AT BEDTIME 90 tablet 0  . montelukast (SINGULAIR) 10 MG tablet TAKE 1 TABLET BY MOUTH EVERY DAY 30 tablet 0  . Multiple Vitamins-Minerals (MULTIVITAMIN WITH MINERALS) tablet Take 1 tablet by mouth daily.    . pantoprazole (PROTONIX) 40 MG tablet TAKE 1 TABLET BY MOUTH DAILY. TAKE 30-60 MINUTES BEFORE FIRST MEAL OF THE DAY 30 tablet 3  . traMADol (ULTRAM) 50 MG tablet Take 1/2 or 1 tablet by mouth every 8 hours as needed for hip pain 60 tablet 1  . triamcinolone cream (KENALOG) 0.1 % Apply 1 application topically 2 (two) times daily. Apply to rash on side as needed 30 g 1   No current facility-administered medications on file prior to visit.     Review of Systems:  As per HPI- otherwise negative.   Physical Examination: Vitals:   05/04/16 1241  BP: 122/60  Pulse: 63  Temp: 98 F (36.7 C)   Vitals:   05/04/16 1241  Weight: 168 lb 12.8 oz (76.6 kg)  Height: 5\' 3"  (1.6 m)   Body mass index is 29.9 kg/m. Ideal Body Weight: Weight in (lb) to have BMI = 25: 140.8  GEN: WDWN, NAD, Non-toxic, A & O x 3 HEENT: Atraumatic, Normocephalic. Neck supple. No masses, No LAD. Ears and Nose: No external deformity. CV: RRR, No M/G/R. No JVD. No thrill. No extra heart sounds. PULM: CTA B, no wheezes, crackles, rhonchi. No retractions. No resp. distress. No accessory muscle use. EXTR: No c/c/e NEURO Normal gait.  PSYCH: Normally interactive. Conversant. Not depressed or anxious appearing.  Calm demeanor.    Assessment and Plan: Counseling, unspecified  Malignant neoplasm of urinary bladder, unspecified site [facility])  Here today to discuss planned upcoming bladder cancer treatment.  After our discussion Crystal Kerr has decided to go ahead with planned procedure.  I got a message from [name]:  Hold apixaban 3 days prior to procedure and resume after when ok with urology Discussed with Crystal Kerr and she understands to hold for 3 days prior to  operation Will pass this along to her urologist as well-  Called Dr. Si Raider office on 4/24- left detailed message for his nurse Crystal Kerr.Crystal Kerr, [facility]  [place], [place] [address]  [phone]   Signed Lamar Blinks, MD   The plan for surgery is for 5/2

## 2016-05-04 NOTE — Progress Notes (Signed)
Pre visit review using our clinic tool,if applicable. No additional management support is needed unless otherwise documented below in the visit note.  

## 2016-05-07 ENCOUNTER — Telehealth: Payer: Self-pay | Admitting: *Deleted

## 2016-05-07 NOTE — Telephone Encounter (Signed)
Per dr [name], clear from a cardiac standpoint for cystoscopy, bil retrogrades and TURBT and okay to d/c eliquis 3 days prior to procedure and to restart after when okay with urology faxed to the number provided.

## 2016-05-13 DIAGNOSIS — D494 Neoplasm of unspecified behavior of bladder: Secondary | ICD-10-CM | POA: Diagnosis not present

## 2016-05-13 DIAGNOSIS — C674 Malignant neoplasm of posterior wall of bladder: Secondary | ICD-10-CM | POA: Diagnosis not present

## 2016-05-13 DIAGNOSIS — Z7901 Long term (current) use of anticoagulants: Secondary | ICD-10-CM | POA: Diagnosis not present

## 2016-05-13 DIAGNOSIS — Z87891 Personal history of nicotine dependence: Secondary | ICD-10-CM | POA: Diagnosis not present

## 2016-05-13 DIAGNOSIS — C672 Malignant neoplasm of lateral wall of bladder: Secondary | ICD-10-CM | POA: Diagnosis not present

## 2016-05-13 DIAGNOSIS — M1990 Unspecified osteoarthritis, unspecified site: Secondary | ICD-10-CM | POA: Diagnosis not present

## 2016-05-13 DIAGNOSIS — J45909 Unspecified asthma, uncomplicated: Secondary | ICD-10-CM | POA: Diagnosis not present

## 2016-05-13 DIAGNOSIS — I110 Hypertensive heart disease with heart failure: Secondary | ICD-10-CM | POA: Diagnosis not present

## 2016-05-13 DIAGNOSIS — I509 Heart failure, unspecified: Secondary | ICD-10-CM | POA: Diagnosis not present

## 2016-05-13 DIAGNOSIS — I4891 Unspecified atrial fibrillation: Secondary | ICD-10-CM | POA: Diagnosis not present

## 2016-05-13 DIAGNOSIS — C679 Malignant neoplasm of bladder, unspecified: Secondary | ICD-10-CM | POA: Diagnosis not present

## 2016-05-13 DIAGNOSIS — C671 Malignant neoplasm of dome of bladder: Secondary | ICD-10-CM | POA: Diagnosis not present

## 2016-05-13 DIAGNOSIS — Z79899 Other long term (current) drug therapy: Secondary | ICD-10-CM | POA: Diagnosis not present

## 2016-05-13 DIAGNOSIS — Z9049 Acquired absence of other specified parts of digestive tract: Secondary | ICD-10-CM | POA: Diagnosis not present

## 2016-05-13 DIAGNOSIS — Z9889 Other specified postprocedural states: Secondary | ICD-10-CM | POA: Diagnosis not present

## 2016-05-13 HISTORY — PX: TRANSURETHRAL RESECTION OF BLADDER TUMOR WITH GYRUS (TURBT-GYRUS): SHX6458

## 2016-05-14 DIAGNOSIS — I4891 Unspecified atrial fibrillation: Secondary | ICD-10-CM | POA: Diagnosis not present

## 2016-05-14 DIAGNOSIS — Z960 Presence of urogenital implants: Secondary | ICD-10-CM | POA: Diagnosis not present

## 2016-05-14 DIAGNOSIS — Z48816 Encounter for surgical aftercare following surgery on the genitourinary system: Secondary | ICD-10-CM | POA: Diagnosis not present

## 2016-05-14 DIAGNOSIS — C672 Malignant neoplasm of lateral wall of bladder: Secondary | ICD-10-CM | POA: Diagnosis not present

## 2016-05-14 DIAGNOSIS — I509 Heart failure, unspecified: Secondary | ICD-10-CM | POA: Diagnosis not present

## 2016-05-14 DIAGNOSIS — I110 Hypertensive heart disease with heart failure: Secondary | ICD-10-CM | POA: Diagnosis not present

## 2016-05-14 DIAGNOSIS — M1990 Unspecified osteoarthritis, unspecified site: Secondary | ICD-10-CM | POA: Diagnosis not present

## 2016-05-14 DIAGNOSIS — D494 Neoplasm of unspecified behavior of bladder: Secondary | ICD-10-CM | POA: Diagnosis not present

## 2016-05-14 DIAGNOSIS — J45909 Unspecified asthma, uncomplicated: Secondary | ICD-10-CM | POA: Diagnosis not present

## 2016-05-15 ENCOUNTER — Other Ambulatory Visit: Payer: Self-pay | Admitting: Cardiovascular Disease

## 2016-05-18 DIAGNOSIS — R319 Hematuria, unspecified: Secondary | ICD-10-CM | POA: Diagnosis not present

## 2016-05-18 DIAGNOSIS — Z6829 Body mass index (BMI) 29.0-29.9, adult: Secondary | ICD-10-CM | POA: Diagnosis not present

## 2016-05-18 NOTE — Telephone Encounter (Signed)
Dr. Crenshaw patient  

## 2016-05-18 NOTE — Telephone Encounter (Signed)
Rx(s) sent to pharmacy electronically.  

## 2016-05-18 NOTE — Telephone Encounter (Signed)
Refill Request.  

## 2016-05-23 ENCOUNTER — Other Ambulatory Visit: Payer: Self-pay | Admitting: Family Medicine

## 2016-06-01 ENCOUNTER — Encounter: Payer: Self-pay | Admitting: Internal Medicine

## 2016-06-01 ENCOUNTER — Ambulatory Visit (INDEPENDENT_AMBULATORY_CARE_PROVIDER_SITE_OTHER): Payer: Medicare Other | Admitting: Internal Medicine

## 2016-06-01 ENCOUNTER — Telehealth: Payer: Self-pay

## 2016-06-01 VITALS — BP 126/66 | HR 71 | Temp 97.9°F | Resp 14 | Ht 63.0 in | Wt 162.5 lb

## 2016-06-01 DIAGNOSIS — B349 Viral infection, unspecified: Secondary | ICD-10-CM

## 2016-06-01 MED ORDER — ONDANSETRON HCL 4 MG PO TABS: 4.0000 mg | ORAL_TABLET | Freq: Three times a day (TID) | ORAL | 0 refills | Status: DC | PRN

## 2016-06-01 NOTE — Progress Notes (Signed)
Pre visit review using our clinic review tool, if applicable. No additional management support is needed unless otherwise documented below in the visit note. 

## 2016-06-01 NOTE — Progress Notes (Signed)
Subjective:    Patient ID: Crystal Kerr, female    DOB: 17-Nov-1924, 83 y.o.   MRN: [id number]  DOS:  06/01/2016 Type of visit - description : acute, ere with her daughter Interval history: Symptoms started 5 days ago: Poor appetite, forced  herself to eat some solids and drink fluids. Decreased energy, headaches. Also loose stools to 4 times a day, + nausea but no vomiting. She is feeling very weak particularly when she stands up. She usually feels slightly  dizzy when she stand up and that seems slightly worse. Urine output remains normal. She had a procedure for bladder cancer 05-13-16 and seems to be recovering well.    Review of Systems Urine output unchanged No fever chills No blood in the stools or abdominal pain. No postprandial abdominal pain. Mild cough or chest congestion. Currently without dysuria or gross hematuria.  Past Medical History:  Diagnosis Date  . Arthritis   . Asthma   . Atrial fibrillation ([place])   . CHF (congestive heart failure) ([place])   . PVC's (premature ventricular contractions)     Past Surgical History:  Procedure Laterality Date  . APPENDECTOMY    . CARDIOVERSION N/A 03/08/2013   Procedure: CARDIOVERSION;  Surgeon: Larey Dresser, MD;  Location: [place];  Service: Cardiovascular;  Laterality: N/A;  . CHOLECYSTECTOMY    . oophrectomy    . TEE WITHOUT CARDIOVERSION N/A 03/08/2013   Procedure: TRANSESOPHAGEAL ECHOCARDIOGRAM (TEE);  Surgeon: Larey Dresser, MD;  Location: [place];  Service: Cardiovascular;  Laterality: N/A;  . TONSILLECTOMY      Social History   Social History  . Marital status: Unknown    Spouse name: N/A  . Number of children: N/A  . Years of education: N/A   Occupational History  . Retired Retired   Social History Main Topics  . Smoking status: Former Smoker    Packs/day: 1.00    Years: 20.00    Types: Cigarettes    Quit date: 04/13/1981  . Smokeless tobacco: Never Used  . Alcohol use Yes    Comment: Occasional  . Drug use: No  . Sexual activity: No   Other Topics Concern  . Not on file   Social History Narrative  . No narrative on file      Allergies as of 06/01/2016   No Known Allergies     Medication List       Accurate as of 06/01/16 11:59 PM. Always use your most recent med list.          acetaminophen 500 MG tablet Commonly known as:  TYLENOL Take 1,000 mg by mouth every 4 (four) hours as needed for moderate pain.   albuterol 108 (90 Base) MCG/ACT inhaler Commonly known as:  PROAIR HFA Inhale 2 puffs into the lungs every 4 (four) hours as needed for wheezing or shortness of breath.   amiodarone 200 MG tablet Commonly known as:  PACERONE TAKE 1 TABLET BY MOUTH DAILY   apixaban 5 MG Tabs tablet Commonly known as:  ELIQUIS Take 1 tablet (5 mg total) by mouth 2 (two) times daily.   budesonide-formoterol 160-4.5 MCG/ACT inhaler Commonly known as:  SYMBICORT INHALE 2 PUFFS FIRST THING EVERY MORNING AND 2 PUFFS EVERY 12 HOURS   diltiazem 120 MG 24 hr capsule Commonly known as:  TIAZAC TAKE 1 CAPSULE BY MOUTH DAILY   docusate sodium 100 MG capsule Commonly known as:  COLACE Take 200 mg by mouth daily as needed for mild constipation.  famotidine 20 MG tablet Commonly known as:  PEPCID TAKE 1 TABLET BY MOUTH EVERY NIGHT AT BEDTIME   montelukast 10 MG tablet Commonly known as:  SINGULAIR TAKE 1 TABLET BY MOUTH EVERY DAY   multivitamin with minerals tablet Take 1 tablet by mouth daily.   ondansetron 4 MG tablet Commonly known as:  ZOFRAN Take 1 tablet (4 mg total) by mouth every 8 (eight) hours as needed for nausea or vomiting.   pantoprazole 40 MG tablet Commonly known as:  PROTONIX TAKE 1 TABLET BY MOUTH DAILY. TAKE 30-60 MINUTES BEFORE FIRST MEAL OF THE DAY   traMADol 50 MG tablet Commonly known as:  ULTRAM Take 1/2 or 1 tablet by mouth every 8 hours as needed for hip pain   triamcinolone cream 0.1 % Commonly known as:   KENALOG Apply 1 application topically 2 (two) times daily. Apply to rash on side as needed          Objective:   Physical Exam BP 126/66 (BP Location: Left Arm, Patient Position: Standing, Cuff Size: Small)   Pulse 71   Temp 97.9 F (36.6 C) (Oral)   Resp 14   Ht 5\' 3"  (1.6 m)   Wt 162 lb 8 oz (73.7 kg)   SpO2 97%   BMI 28.79 kg/m  General:   Well developed, well nourished, sitting in a wheelchair, NAD.  HEENT:  Normocephalic . Face symmetric, atraumatic . Not pale or jaundice.oral membranes moist Lungs:  CTA B Normal respiratory effort, no intercostal retractions, no accessory muscle use. Heart: RRR,  no murmur.  no pretibial edema bilaterally  Abdomen:  Not distended, soft, minimal tenderness at the epigastric area and left abdomen without mass or rebound Skin: Not pale. Not jaundice Neurologic:  alert & oriented X3.  Speech normal, gait not tested. Psych--  Cognition and judgment appear intact.  Cooperative with normal attention span and concentration.  Behavior appropriate. No anxious or depressed appearing.    Assessment & Plan:   83 year old ladyith history of bladder cancer status post recent cystoscopy,asthma, atrial fibrillation-anticoagulated, CHFpresents with the following.  Viral syndromes: has GI > respiratory sx . Does not look toxic, oral membranes are moist, good urine output, SBP sitting 138, standing 126 She is probably mild dehydrated by history. PLAN: zofran, push fluids, call if no better, see AVS

## 2016-06-01 NOTE — Telephone Encounter (Signed)
PA approved through 06/01/2017. [facility] informed of PA approval.

## 2016-06-01 NOTE — Telephone Encounter (Signed)
PA initiated via Covermymeds; KEY: [id number]. Awaiting determination.

## 2016-06-01 NOTE — Patient Instructions (Signed)
Rest  Drink plenty of fluids: Water, Gatorade, ginger ale  Bland diet: Soup, crackers, nothing heavy.  zofran  as needed for nausea.  Call if not gradually improving  Call or go to the ER if: Severe symptoms, fever, chills, stomach pain severe, blood in the stools

## 2016-06-02 ENCOUNTER — Telehealth: Payer: Self-pay | Admitting: Family Medicine

## 2016-06-02 ENCOUNTER — Emergency Department (HOSPITAL_COMMUNITY): Payer: Medicare Other

## 2016-06-02 ENCOUNTER — Inpatient Hospital Stay (HOSPITAL_COMMUNITY)
Admission: EM | Admit: 2016-06-02 | Discharge: 2016-06-05 | DRG: 194 | Disposition: A | Discharge status: Skilled Nursing Facility | Payer: Medicare Other | Attending: Internal Medicine | Admitting: Internal Medicine

## 2016-06-02 ENCOUNTER — Other Ambulatory Visit: Payer: Self-pay

## 2016-06-02 ENCOUNTER — Encounter (HOSPITAL_COMMUNITY): Payer: Self-pay | Admitting: Emergency Medicine

## 2016-06-02 DIAGNOSIS — Z79899 Other long term (current) drug therapy: Secondary | ICD-10-CM

## 2016-06-02 DIAGNOSIS — R2681 Unsteadiness on feet: Secondary | ICD-10-CM | POA: Diagnosis not present

## 2016-06-02 DIAGNOSIS — I110 Hypertensive heart disease with heart failure: Secondary | ICD-10-CM | POA: Diagnosis present

## 2016-06-02 DIAGNOSIS — Z7901 Long term (current) use of anticoagulants: Secondary | ICD-10-CM

## 2016-06-02 DIAGNOSIS — J45909 Unspecified asthma, uncomplicated: Secondary | ICD-10-CM | POA: Diagnosis present

## 2016-06-02 DIAGNOSIS — R9431 Abnormal electrocardiogram [ECG] [EKG]: Secondary | ICD-10-CM | POA: Diagnosis not present

## 2016-06-02 DIAGNOSIS — Z87891 Personal history of nicotine dependence: Secondary | ICD-10-CM | POA: Diagnosis not present

## 2016-06-02 DIAGNOSIS — Z8551 Personal history of malignant neoplasm of bladder: Secondary | ICD-10-CM

## 2016-06-02 DIAGNOSIS — I2510 Atherosclerotic heart disease of native coronary artery without angina pectoris: Secondary | ICD-10-CM | POA: Diagnosis present

## 2016-06-02 DIAGNOSIS — K219 Gastro-esophageal reflux disease without esophagitis: Secondary | ICD-10-CM | POA: Diagnosis present

## 2016-06-02 DIAGNOSIS — H109 Unspecified conjunctivitis: Secondary | ICD-10-CM | POA: Diagnosis present

## 2016-06-02 DIAGNOSIS — J45998 Other asthma: Secondary | ICD-10-CM | POA: Diagnosis not present

## 2016-06-02 DIAGNOSIS — R0602 Shortness of breath: Secondary | ICD-10-CM | POA: Diagnosis not present

## 2016-06-02 DIAGNOSIS — M25552 Pain in left hip: Secondary | ICD-10-CM

## 2016-06-02 DIAGNOSIS — R0600 Dyspnea, unspecified: Secondary | ICD-10-CM | POA: Diagnosis not present

## 2016-06-02 DIAGNOSIS — I5032 Chronic diastolic (congestive) heart failure: Secondary | ICD-10-CM | POA: Diagnosis present

## 2016-06-02 DIAGNOSIS — I10 Essential (primary) hypertension: Secondary | ICD-10-CM | POA: Diagnosis not present

## 2016-06-02 DIAGNOSIS — M6281 Muscle weakness (generalized): Secondary | ICD-10-CM | POA: Diagnosis not present

## 2016-06-02 DIAGNOSIS — D72829 Elevated white blood cell count, unspecified: Secondary | ICD-10-CM | POA: Diagnosis not present

## 2016-06-02 DIAGNOSIS — H1031 Unspecified acute conjunctivitis, right eye: Secondary | ICD-10-CM | POA: Diagnosis not present

## 2016-06-02 DIAGNOSIS — E639 Nutritional deficiency, unspecified: Secondary | ICD-10-CM | POA: Diagnosis not present

## 2016-06-02 DIAGNOSIS — I480 Paroxysmal atrial fibrillation: Secondary | ICD-10-CM | POA: Diagnosis present

## 2016-06-02 DIAGNOSIS — J189 Pneumonia, unspecified organism: Secondary | ICD-10-CM | POA: Diagnosis present

## 2016-06-02 DIAGNOSIS — I509 Heart failure, unspecified: Secondary | ICD-10-CM | POA: Diagnosis not present

## 2016-06-02 DIAGNOSIS — R2689 Other abnormalities of gait and mobility: Secondary | ICD-10-CM | POA: Diagnosis not present

## 2016-06-02 DIAGNOSIS — J180 Bronchopneumonia, unspecified organism: Principal | ICD-10-CM | POA: Diagnosis present

## 2016-06-02 DIAGNOSIS — R05 Cough: Secondary | ICD-10-CM | POA: Diagnosis not present

## 2016-06-02 DIAGNOSIS — K5900 Constipation, unspecified: Secondary | ICD-10-CM | POA: Diagnosis not present

## 2016-06-02 DIAGNOSIS — Z8679 Personal history of other diseases of the circulatory system: Secondary | ICD-10-CM

## 2016-06-02 DIAGNOSIS — I4891 Unspecified atrial fibrillation: Secondary | ICD-10-CM | POA: Diagnosis not present

## 2016-06-02 DIAGNOSIS — R093 Abnormal sputum: Secondary | ICD-10-CM | POA: Diagnosis not present

## 2016-06-02 DIAGNOSIS — R911 Solitary pulmonary nodule: Secondary | ICD-10-CM | POA: Diagnosis not present

## 2016-06-02 LAB — COMPREHENSIVE METABOLIC PANEL
ALBUMIN: 3.2 g/dL — AB (ref 3.5–5.0)
ALT: 42 U/L (ref 14–54)
AST: 59 U/L — AB (ref 15–41)
Alkaline Phosphatase: 115 U/L (ref 38–126)
Anion gap: 13 (ref 5–15)
BUN: 20 mg/dL (ref 6–20)
CHLORIDE: 96 mmol/L — AB (ref 101–111)
CO2: 24 mmol/L (ref 22–32)
Calcium: 9.1 mg/dL (ref 8.9–10.3)
Creatinine, Ser: 1.07 mg/dL — ABNORMAL HIGH (ref 0.44–1.00)
GFR calc Af Amer: 52 mL/min — ABNORMAL LOW (ref >60)
GFR calc non Af Amer: 45 mL/min — ABNORMAL LOW (ref >60)
Glucose, Bld: 77 mg/dL (ref 65–99)
Potassium: 4.3 mmol/L (ref 3.5–5.1)
SODIUM: 133 mmol/L — AB (ref 135–145)
Total Bilirubin: 0.9 mg/dL (ref 0.3–1.2)
Total Protein: 7.3 g/dL (ref 6.5–8.1)

## 2016-06-02 LAB — CBC WITH DIFFERENTIAL/PLATELET
Basophils Absolute: 0 10*3/uL (ref 0.0–0.1)
Basophils Relative: 0 %
EOS ABS: 0.1 10*3/uL (ref 0.0–0.7)
EOS PCT: 1 %
HCT: 36.4 % (ref 36.0–46.0)
HEMOGLOBIN: 12 g/dL (ref 12.0–15.0)
LYMPHS ABS: 1 10*3/uL (ref 0.7–4.0)
Lymphocytes Relative: 7 %
MCH: 27.5 pg (ref 26.0–34.0)
MCHC: 33 g/dL (ref 30.0–36.0)
MCV: 83.3 fL (ref 78.0–100.0)
MONOS PCT: 10 %
Monocytes Absolute: 1.5 10*3/uL — ABNORMAL HIGH (ref 0.1–1.0)
Neutro Abs: 12.4 10*3/uL — ABNORMAL HIGH (ref 1.7–7.7)
Neutrophils Relative %: 82 %
PLATELETS: 366 10*3/uL (ref 150–400)
RBC: 4.37 MIL/uL (ref 3.87–5.11)
RDW: 15.1 % (ref 11.5–15.5)
WBC: 15 10*3/uL — ABNORMAL HIGH (ref 4.0–10.5)

## 2016-06-02 LAB — BRAIN NATRIURETIC PEPTIDE: B NATRIURETIC PEPTIDE 5: 176.2 pg/mL — AB (ref 0.0–100.0)

## 2016-06-02 LAB — TROPONIN I: Troponin I: <0.03 ng/mL (ref <0.03)

## 2016-06-02 MED ORDER — SODIUM CHLORIDE 0.9 % IV BOLUS (SEPSIS)
500.0000 mL | Freq: Once | INTRAVENOUS | Status: AC
  Administered 2016-06-02: 500 mL via INTRAVENOUS

## 2016-06-02 MED ORDER — LEVALBUTEROL HCL 0.63 MG/3ML IN NEBU: 0.6300 mg | INHALATION_SOLUTION | Freq: Four times a day (QID) | RESPIRATORY_TRACT | Status: DC | PRN

## 2016-06-02 MED ORDER — AMIODARONE HCL 200 MG PO TABS
200.0000 mg | ORAL_TABLET | Freq: Every day | ORAL | Status: DC
Start: 2016-06-03 — End: 2016-06-05
  Administered 2016-06-03 – 2016-06-05 (×3): 200 mg via ORAL
  Filled 2016-06-02 (×3): qty 1

## 2016-06-02 MED ORDER — ACETAMINOPHEN 325 MG PO TABS
650.0000 mg | ORAL_TABLET | Freq: Four times a day (QID) | ORAL | Status: DC | PRN
  Administered 2016-06-02 – 2016-06-03 (×3): 650 mg via ORAL
  Filled 2016-06-02 (×4): qty 2

## 2016-06-02 MED ORDER — DEXTROSE 5 % IV SOLN
1.0000 g | Freq: Once | INTRAVENOUS | Status: AC
  Administered 2016-06-02: 1 g via INTRAVENOUS
  Filled 2016-06-02: qty 10

## 2016-06-02 MED ORDER — APIXABAN 5 MG PO TABS
5.0000 mg | ORAL_TABLET | Freq: Two times a day (BID) | ORAL | Status: DC
  Administered 2016-06-03 – 2016-06-05 (×6): 5 mg via ORAL
  Filled 2016-06-02 (×6): qty 1

## 2016-06-02 MED ORDER — ADULT MULTIVITAMIN W/MINERALS CH
1.0000 | ORAL_TABLET | Freq: Every day | ORAL | Status: DC
  Administered 2016-06-03 – 2016-06-05 (×3): 1 via ORAL
  Filled 2016-06-02 (×3): qty 1

## 2016-06-02 MED ORDER — KETOROLAC TROMETHAMINE 15 MG/ML IJ SOLN
15.0000 mg | Freq: Once | INTRAMUSCULAR | Status: AC
Start: 2016-06-02 — End: 2016-06-02
  Administered 2016-06-02: 15 mg via INTRAVENOUS
  Filled 2016-06-02: qty 1

## 2016-06-02 MED ORDER — DEXTROSE 5 % IV SOLN
1.0000 g | INTRAVENOUS | Status: DC
  Administered 2016-06-03 – 2016-06-05 (×3): 1 g via INTRAVENOUS
  Filled 2016-06-02 (×3): qty 10

## 2016-06-02 MED ORDER — DOCUSATE SODIUM 100 MG PO CAPS: 200.0000 mg | ORAL_CAPSULE | Freq: Every day | ORAL | Status: DC | PRN

## 2016-06-02 MED ORDER — MONTELUKAST SODIUM 10 MG PO TABS
10.0000 mg | ORAL_TABLET | Freq: Every day | ORAL | Status: DC
  Administered 2016-06-03 – 2016-06-05 (×3): 10 mg via ORAL
  Filled 2016-06-02 (×3): qty 1

## 2016-06-02 MED ORDER — DILTIAZEM HCL ER COATED BEADS 120 MG PO CP24
120.0000 mg | ORAL_CAPSULE | Freq: Every day | ORAL | Status: DC
  Administered 2016-06-03 – 2016-06-05 (×3): 120 mg via ORAL
  Filled 2016-06-02 (×3): qty 1

## 2016-06-02 MED ORDER — ENSURE ENLIVE PO LIQD
237.0000 mL | Freq: Two times a day (BID) | ORAL | Status: DC
  Administered 2016-06-03 – 2016-06-04 (×4): 237 mL via ORAL

## 2016-06-02 MED ORDER — IPRATROPIUM BROMIDE 0.02 % IN SOLN
0.5000 mg | Freq: Four times a day (QID) | RESPIRATORY_TRACT | Status: DC
  Administered 2016-06-02: 0.5 mg via RESPIRATORY_TRACT
  Filled 2016-06-02: qty 2.5

## 2016-06-02 MED ORDER — FAMOTIDINE 20 MG PO TABS
20.0000 mg | ORAL_TABLET | Freq: Two times a day (BID) | ORAL | Status: DC
  Administered 2016-06-02 – 2016-06-05 (×6): 20 mg via ORAL
  Filled 2016-06-02 (×6): qty 1

## 2016-06-02 MED ORDER — DEXTROSE 5 % IV SOLN
500.0000 mg | INTRAVENOUS | Status: DC
  Administered 2016-06-03 – 2016-06-05 (×3): 500 mg via INTRAVENOUS
  Filled 2016-06-02 (×3): qty 500

## 2016-06-02 MED ORDER — LEVALBUTEROL HCL 0.63 MG/3ML IN NEBU
0.6300 mg | INHALATION_SOLUTION | Freq: Four times a day (QID) | RESPIRATORY_TRACT | Status: DC
  Administered 2016-06-03 – 2016-06-04 (×8): 0.63 mg via RESPIRATORY_TRACT
  Filled 2016-06-02 (×9): qty 3

## 2016-06-02 MED ORDER — PANTOPRAZOLE SODIUM 40 MG PO TBEC
40.0000 mg | DELAYED_RELEASE_TABLET | Freq: Every day | ORAL | Status: DC
  Administered 2016-06-03 – 2016-06-05 (×3): 40 mg via ORAL
  Filled 2016-06-02 (×3): qty 1

## 2016-06-02 MED ORDER — LEVALBUTEROL HCL 0.63 MG/3ML IN NEBU
0.6300 mg | INHALATION_SOLUTION | Freq: Four times a day (QID) | RESPIRATORY_TRACT | Status: DC
  Administered 2016-06-02: 0.63 mg via RESPIRATORY_TRACT
  Filled 2016-06-02: qty 3

## 2016-06-02 MED ORDER — BACITRACIN-POLYMYXIN B 500-10000 UNIT/GM OP OINT
TOPICAL_OINTMENT | Freq: Four times a day (QID) | OPHTHALMIC | Status: DC
  Administered 2016-06-02 – 2016-06-05 (×10): via OPHTHALMIC
  Filled 2016-06-02: qty 3.5

## 2016-06-02 MED ORDER — DEXTROSE 5 % IV SOLN
500.0000 mg | Freq: Once | INTRAVENOUS | Status: AC
  Administered 2016-06-02: 500 mg via INTRAVENOUS
  Filled 2016-06-02: qty 500

## 2016-06-02 MED ORDER — MOMETASONE FURO-FORMOTEROL FUM 200-5 MCG/ACT IN AERO
2.0000 | INHALATION_SPRAY | Freq: Two times a day (BID) | RESPIRATORY_TRACT | Status: DC
  Administered 2016-06-03 – 2016-06-04 (×4): 2 via RESPIRATORY_TRACT
  Filled 2016-06-02: qty 8.8

## 2016-06-02 MED ORDER — IPRATROPIUM BROMIDE 0.02 % IN SOLN
0.5000 mg | Freq: Four times a day (QID) | RESPIRATORY_TRACT | Status: DC
  Administered 2016-06-03 – 2016-06-04 (×8): 0.5 mg via RESPIRATORY_TRACT
  Filled 2016-06-02 (×9): qty 2.5

## 2016-06-02 NOTE — ED Provider Notes (Signed)
[facility] DEPT Provider Note   CSN: [id number] Arrival date & time: 06/02/16  1507     History   Chief Complaint Chief Complaint  Patient presents with  . Cough    HPI Crystal Kerr is a 83 y.o. female.  The history is provided by the patient.  Cough  This is a new problem. The current episode started 2 days ago. The problem occurs hourly. The problem has been gradually worsening. The cough is productive of brown sputum and productive of purulent sputum. There has been no fever. Associated symptoms include headaches (frontal headache when she lays in bed), rhinorrhea and wheezing. Pertinent negatives include no chest pain, no chills, no myalgias, no shortness of breath and no eye redness. She has tried cough syrup for the symptoms. The treatment provided mild relief. Her past medical history is significant for bronchitis and asthma. Her past medical history does not include pneumonia. Past medical history comments: CHF.   Daughter has had similar symptoms for the past week.  Pt also states that she has had a "GI bug" for 1 week. Seen by her PCP and told she had a virus. Now resolved after a couple of days. Now is able to tolerate PO intake, but has a decreased appetite and has not been drinking much fluids or taking much nutrition by mouth.  Reports resent bladder cancer resection on May 2nd; pt declined ChemoTx.   Past Medical History:  Diagnosis Date  . Arthritis   . Asthma   . Atrial fibrillation ([place])   . CHF (congestive heart failure) ([place])   . PVC's (premature ventricular contractions)     Patient Active Problem List   Diagnosis Date Noted  . Bilateral leg edema 12/19/2013  . 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 Benign paroxysmal positional vertigo 09/13/2013  . Upper airway cough syndrome 07/10/2013  . Allergic rhinitis 07/05/2013  . Persistent atrial fibrillation ([place]) 03/30/2013  .  Shortness of breath 02/06/2013  . Acute on chronic diastolic congestive heart failure ([place]) 02/04/2013  . Arthritis of spine ([place]) 07/01/2012  . Intrinsic asthma 07/01/2012  . Dyspnea 04/12/2012  . PVC's (premature ventricular contractions) 04/24/2011  . Murmur, cardiac 04/24/2011    Past Surgical History:  Procedure Laterality Date  . APPENDECTOMY    . CARDIOVERSION N/A 03/08/2013   Procedure: CARDIOVERSION;  Surgeon: Larey Dresser, MD;  Location: [place];  Service: Cardiovascular;  Laterality: N/A;  . CHOLECYSTECTOMY    . oophrectomy    . TEE WITHOUT CARDIOVERSION N/A 03/08/2013   Procedure: TRANSESOPHAGEAL ECHOCARDIOGRAM (TEE);  Surgeon: Larey Dresser, MD;  Location: [place];  Service: Cardiovascular;  Laterality: N/A;  . TONSILLECTOMY      OB History    No data available       Home Medications    Prior to Admission medications   Medication Sig Start Date End Date Taking? Authorizing Provider  acetaminophen (TYLENOL) 500 MG tablet Take 1,000 mg by mouth every 4 (four) hours as needed for moderate pain.   Yes [provider]  albuterol (PROAIR HFA) 108 (90 Base) MCG/ACT inhaler Inhale 2 puffs into the lungs every 4 (four) hours as needed for wheezing or shortness of breath. 04/23/15  Yes Ezekiel Slocumb, PA-C  amiodarone (PACERONE) 200 MG tablet TAKE 1 TABLET BY MOUTH DAILY 04/20/16  Yes Crenshaw, Denice Bors, MD  apixaban (ELIQUIS) 5 MG TABS tablet Take 1 tablet (5 mg total) by  mouth 2 (two) times daily. 03/27/16  Yes Wellington Hampshire, MD  budesonide-formoterol (SYMBICORT) 160-4.5 MCG/ACT inhaler INHALE 2 PUFFS FIRST THING EVERY MORNING AND 2 PUFFS EVERY 12 HOURS 07/03/15  Yes Jeffery, Chelle, PA-C  diltiazem (TIAZAC) 120 MG 24 hr capsule TAKE 1 CAPSULE BY MOUTH DAILY 05/18/16  Yes Crenshaw, Denice Bors, MD  famotidine (PEPCID) 20 MG tablet TAKE 1 TABLET BY MOUTH EVERY NIGHT AT BEDTIME 04/20/16  Yes Copland, Gay Filler, MD  montelukast (SINGULAIR) 10 MG tablet TAKE 1  TABLET BY MOUTH EVERY DAY 05/25/16  Yes Copland, Gay Filler, MD  Multiple Vitamins-Minerals (MULTIVITAMIN WITH MINERALS) tablet Take 1 tablet by mouth daily.   Yes [provider]  ondansetron (ZOFRAN) 4 MG tablet Take 1 tablet (4 mg total) by mouth every 8 (eight) hours as needed for nausea or vomiting. 06/01/16  Yes Paz, Alda Berthold, MD  pantoprazole (PROTONIX) 40 MG tablet TAKE 1 TABLET BY MOUTH DAILY. TAKE 30-60 MINUTES BEFORE FIRST MEAL OF THE DAY 03/05/16  Yes Copland, Gay Filler, MD  traMADol (ULTRAM) 50 MG tablet Take 1/2 or 1 tablet by mouth every 8 hours as needed for hip pain 02/12/16  Yes Copland, Gay Filler, MD  triamcinolone cream (KENALOG) 0.1 % Apply 1 application topically 2 (two) times daily. Apply to rash on side as needed 12/25/15  Yes Copland, Gay Filler, MD  docusate sodium (COLACE) 100 MG capsule Take 200 mg by mouth daily as needed for mild constipation.    [provider]    Family History Family History  Problem Relation Age of Onset  . Heart failure Mother   . Diabetes Father   . Heart disease Unknown        No family history    Social History Social History  Substance Use Topics  . Smoking status: Former Smoker    Packs/day: 1.00    Years: 20.00    Types: Cigarettes    Quit date: 04/13/1981  . Smokeless tobacco: Never Used  . Alcohol use Yes     Comment: Occasional     Allergies   Patient has no known allergies.   Review of Systems Review of Systems  Constitutional: Negative for chills.  HENT: Positive for rhinorrhea.   Eyes: Negative for redness.  Respiratory: Positive for cough and wheezing. Negative for shortness of breath.   Cardiovascular: Negative for chest pain.  Musculoskeletal: Negative for myalgias.  Neurological: Positive for headaches (frontal headache when she lays in bed).  All other systems are reviewed and are negative for acute change except as noted in the HPI    Physical Exam Updated Vital Signs BP (!) 179/56 (BP  Location: Left Arm)   Pulse 72   Temp 98.5 F (36.9 C) (Oral)   Resp 18   Ht 5\' 3"  (1.6 m)   Wt 74.8 kg (165 lb)   SpO2 95%   BMI 29.23 kg/m   Physical Exam  Constitutional: She is oriented to person, place, and time. She appears well-developed and well-nourished. No distress.  HENT:  Head: Normocephalic and atraumatic.  Nose: Nose normal.  Mouth/Throat: Mucous membranes are dry.  Eyes: Conjunctivae and EOM are normal. Pupils are equal, round, and reactive to light. Right eye exhibits no discharge. Left eye exhibits no discharge. No scleral icterus.  Neck: Normal range of motion. Neck supple.  Cardiovascular: Normal rate and regular rhythm.  Exam reveals no gallop and no friction rub.   No murmur heard. Pulmonary/Chest: Effort normal and breath sounds normal.  No stridor. No respiratory distress. She has no rales.  Abdominal: Soft. She exhibits no distension. There is no tenderness.  Musculoskeletal: She exhibits no edema or tenderness.  Neurological: She is alert and oriented to person, place, and time.  Skin: Skin is warm and dry. No rash noted. She is not diaphoretic. No erythema.  Psychiatric: She has a normal mood and affect.  Vitals reviewed.    ED Treatments / Results  Labs (all labs ordered are listed, but only abnormal results are displayed) Labs Reviewed  COMPREHENSIVE METABOLIC PANEL - Abnormal; Notable for the following:       Result Value   Sodium 133 (*)    Chloride 96 (*)    Creatinine, Ser 1.07 (*)    Albumin 3.2 (*)    AST 59 (*)    GFR calc non Af Amer 45 (*)    GFR calc Af Amer 52 (*)    All other components within normal limits  CBC WITH DIFFERENTIAL/PLATELET - Abnormal; Notable for the following:    WBC 15.0 (*)    Neutro Abs 12.4 (*)    Monocytes Absolute 1.5 (*)    All other components within normal limits  BRAIN NATRIURETIC PEPTIDE - Abnormal; Notable for the following:    B Natriuretic Peptide 176.2 (*)    All other components within normal  limits  CULTURE, EXPECTORATED SPUTUM-ASSESSMENT  TROPONIN I  STREP PNEUMONIAE URINARY ANTIGEN  CBC WITH DIFFERENTIAL/PLATELET  BASIC METABOLIC PANEL    EKG  EKG Interpretation None       Radiology Dg Chest 2 View  Result Date: 06/02/2016 CLINICAL DATA:  83 y/o  F; cough. EXAM: CHEST  2 VIEW COMPARISON:  04/08/2016 chest radiograph. FINDINGS: Stable normal cardiac silhouette. Aortic atherosclerosis with calcification. Diffuse hazy opacification of lungs and ill-defined opacities in mid and upper lung zones. No pleural effusion or pneumothorax. Bones are unremarkable. IMPRESSION: Diffuse hazy opacification of lungs and ill-defined opacities in mid and upper lung zones may represent developing pulmonary edema or infiltrates. Electronically Signed   By: Kristine Garbe M.D.   On: 06/02/2016 16:08   Ct Chest Wo Contrast  Result Date: 06/02/2016 CLINICAL DATA:  83 y/o  F; productive cough. EXAM: CT CHEST WITHOUT CONTRAST TECHNIQUE: Multidetector CT imaging of the chest was performed following the standard protocol without IV contrast. COMPARISON:  06/02/2016 chest radiograph. FINDINGS: Cardiovascular: Normal heart size. No pericardial effusion. Normal caliber thoracic aorta. Mild coronary artery calcification. Mediastinum/Nodes: Mediastinal lymphadenopathy is likely reactive with lymph nodes measuring up to 16 x 12 mm in the AP window (series 2, image 41). The thoracic esophagus is unremarkable. Left lobe of thyroid nodule measuring 14 mm. Lungs/Pleura: Diffuse peribronchial thickening with ground-glass opacities and clustered nodules in a bronchovascular distribution. Findings are most consistent with acute bronchopneumonia. There is the suggestion of cavitation within some pulmonary nodules, although this is somewhat difficult to distinguish from bronchiectatic changes (series 5, image 62, in the right lower lobe). No significant interlobular septal thickening to suggest pulmonary edema.  No pleural effusion. Upper Abdomen: Cholecystectomy.  Small hiatal hernia. Musculoskeletal: Mild reverse S curvature of the thoracic spine and discogenic degenerative changes. No acute osseous abnormality is evident. IMPRESSION: 1. Ground-glass opacities in clustered nodules in bronchovascular distribution most compatible with acute bronchopneumonia. There is a suggestion of cavitation within the larger nodules, although this is somewhat difficult to distinguish from bronchiectatic changes. Findings suggest an atypical infection including mycobacterial or fungal pneumonia. 2. Mediastinal lymphadenopathy, probably reactive. Electronically Signed  By: Kristine Garbe M.D.   On: 06/02/2016 18:40    Procedures Procedures (including critical care time)  Medications Ordered in ED Medications - No data to display   Initial Impression / Assessment and Plan / ED Course  I have reviewed the triage vital signs and the nursing notes.  Pertinent labs & imaging results that were available during my care of the patient were reviewed by me and considered in my medical decision making (see chart for details).     Workup consistent with atypical pneumonia without evidence of sepsis. Clinically patient is dehydrated with dry mucous membranes. No evidence of volume overload or CHF exacerbation noted. IV fluids initiated. She is encouraged to take by mouth however, she has poor appetite. Patient given IV Rocephin and azithromycin. We'll admit the patient for continued hydration and IV Abx until she is able to take medicine by mouth.    Final Clinical Impressions(s) / ED Diagnoses   Final diagnoses:  Atypical pneumonia      Jaymian Bogart, Grayce Sessions, MD 06/03/16 [phone]

## 2016-06-02 NOTE — ED Notes (Signed)
Made two blood draw/IV attempts in [facility] and [facility].  Will have someone else attempt.

## 2016-06-02 NOTE — H&P (Signed)
History and Physical    NIMRAT WOOLWORTH XNA:[id number] DOB: [birth year] DOA: 06/02/2016  PCP: [name], MD   Patient coming from: Home.  I have personally briefly reviewed patient's old medical records in [place]  Chief Complaint: Cough.  HPI: Crystal Kerr is a 83 y.o. female with medical history significant of osteoarthritis, asthma, paroxysmal atrial fibrillation on Eliquis, CHF, PVCs who is coming to the emergency department with complaints of brown sputum producing cough, dyspnea, wheezing, decreased appetite, increased somnolence, fatigue and malaise. She denies fever, chills, chest pain, palpitations, dizziness, diaphoresis, pitting edema lower extremities PND or orthopnea. She denies diarrhea at this time, but stated that about a week or so ago she had a "stomach flu" with some loose stools. She denies abdominal pain, nausea, emesis, melena or hematochezia. She denies dysuria or hematuria.  ED Course: The patient received normal saline 500 mL, ceftriaxone 1 g and azithromycin 500 mg IVPB in the emergency department. WBC 15 with 82% neutrophils, hemoglobin 12 g/dL and platelets 366. Sodium 133, potassium 4.3, chloride 96 and bicarbonate 24 mmol/L. BUN was 20, creatinine 1.07 and glucose 77 mg/dL. Her BMP 176.2 pg/dL.  Imaging: Chest radiograph shows diffuse hazy opacification of the lungs with ill-defined opacities in mid and upper lung zones. CT chest without contrast showed groundglass opacities and cluster nodules in bronchovascular distribution most compatible with acute bronchopneumonia. There is a suggestion of cavitation within the larger nodules. Please see images and full radiology reports for further detail.  Review of Systems: As per HPI otherwise 10 point review of systems negative.    Past Medical History:  Diagnosis Date  . Arthritis   . Asthma   . Atrial fibrillation ([place])   . CHF (congestive heart failure) ([place])   . PVC's (premature  ventricular contractions)     Past Surgical History:  Procedure Laterality Date  . APPENDECTOMY    . CARDIOVERSION N/A 03/08/2013   Procedure: CARDIOVERSION;  Surgeon: Larey Dresser, MD;  Location: [facility];  Service: Cardiovascular;  Laterality: N/A;  . CHOLECYSTECTOMY    . oophrectomy    . TEE WITHOUT CARDIOVERSION N/A 03/08/2013   Procedure: TRANSESOPHAGEAL ECHOCARDIOGRAM (TEE);  Surgeon: Larey Dresser, MD;  Location: [facility];  Service: Cardiovascular;  Laterality: N/A;  . TONSILLECTOMY       reports that she quit smoking about 35 years ago. Her smoking use included Cigarettes. She has a 20.00 pack-year smoking history. She has never used smokeless tobacco. She reports that she drinks alcohol. She reports that she does not use drugs.  No Known Allergies  Family History  Problem Relation Age of Onset  . Heart failure Mother   . Diabetes Father   . Heart disease Unknown        No family history    Prior to Admission medications   Medication Sig Start Date End Date Taking? Authorizing Provider  acetaminophen (TYLENOL) 500 MG tablet Take 1,000 mg by mouth every 4 (four) hours as needed for moderate pain.   Yes [provider]  albuterol (PROAIR HFA) 108 (90 Base) MCG/ACT inhaler Inhale 2 puffs into the lungs every 4 (four) hours as needed for wheezing or shortness of breath. 04/23/15  Yes Ezekiel Slocumb, PA-C  amiodarone (PACERONE) 200 MG tablet TAKE 1 TABLET BY MOUTH DAILY 04/20/16  Yes Crenshaw, Denice Bors, MD  apixaban (ELIQUIS) 5 MG TABS tablet Take 1 tablet (5 mg total) by mouth 2 (two) times daily. 03/27/16  Yes Arida,  Mertie Clause, MD  budesonide-formoterol (SYMBICORT) 160-4.5 MCG/ACT inhaler INHALE 2 PUFFS FIRST THING EVERY MORNING AND 2 PUFFS EVERY 12 HOURS 07/03/15  Yes Jeffery, Chelle, PA-C  diltiazem (TIAZAC) 120 MG 24 hr capsule TAKE 1 CAPSULE BY MOUTH DAILY 05/18/16  Yes Crenshaw, Denice Bors, MD  famotidine (PEPCID) 20 MG tablet TAKE 1 TABLET BY MOUTH EVERY NIGHT  AT BEDTIME 04/20/16  Yes Copland, Gay Filler, MD  montelukast (SINGULAIR) 10 MG tablet TAKE 1 TABLET BY MOUTH EVERY DAY 05/25/16  Yes Copland, Gay Filler, MD  Multiple Vitamins-Minerals (MULTIVITAMIN WITH MINERALS) tablet Take 1 tablet by mouth daily.   Yes [provider]  ondansetron (ZOFRAN) 4 MG tablet Take 1 tablet (4 mg total) by mouth every 8 (eight) hours as needed for nausea or vomiting. 06/01/16  Yes Paz, Alda Berthold, MD  pantoprazole (PROTONIX) 40 MG tablet TAKE 1 TABLET BY MOUTH DAILY. TAKE 30-60 MINUTES BEFORE FIRST MEAL OF THE DAY 03/05/16  Yes Copland, Gay Filler, MD  traMADol (ULTRAM) 50 MG tablet Take 1/2 or 1 tablet by mouth every 8 hours as needed for hip pain 02/12/16  Yes Copland, Gay Filler, MD  triamcinolone cream (KENALOG) 0.1 % Apply 1 application topically 2 (two) times daily. Apply to rash on side as needed 12/25/15  Yes Copland, Gay Filler, MD  docusate sodium (COLACE) 100 MG capsule Take 200 mg by mouth daily as needed for mild constipation.    [provider]    Physical Exam: Vitals:   06/02/16 2030 06/02/16 2100 06/02/16 2200 06/02/16 2300  BP: (!) 113/99 (!) 147/50 (!) 161/54   Pulse: 71 68 69   Resp: (!) 28 (!) 31 (!) 24   Temp:   (!) 101.6 F (38.7 C)   TempSrc:   Oral   SpO2: 92% 91% 92% 93%  Weight:   74.4 kg (164 lb 0.4 oz)   Height:   5\' 3"  (1.6 m)     Constitutional: NAD, calm, comfortable Eyes: PERRL, Right eye palpebral edema, with conjunctival injection and purulent discharge. ENMT: Moderate hearing difficulty. Mucous membranes are mildly dry. Posterior pharynx clear of any exudate or lesions. Neck: normal, supple, no masses, no thyromegaly Respiratory: Bilateral rhonchi and crackles, no wheezing. Normal respiratory effort. No accessory muscle use.  Cardiovascular: Regular rate and rhythm, no murmurs / rubs / gallops. No extremity edema. 2+ pedal pulses. No carotid bruits.  Abdomen: Soft, no tenderness, no masses palpated. No  hepatosplenomegaly. Bowel sounds positive.  Musculoskeletal: no clubbing / cyanosis. Good ROM, no contractures. Normal muscle tone.  Skin: no rashes, lesions, ulcers. No induration Neurologic: CN 2-12 grossly intact. Sensation intact, DTR normal. Strength 5/5 in all 4.  Psychiatric: Normal judgment and insight. Alert and oriented x 3. Normal mood.    Labs on Admission: I have personally reviewed following labs and imaging studies  CBC:  Recent Labs Lab 06/02/16 1659  WBC 15.0*  NEUTROABS 12.4*  HGB 12.0  HCT 36.4  MCV 83.3  PLT 419   Basic Metabolic Panel:  Recent Labs Lab 06/02/16 1659  NA 133*  K 4.3  CL 96*  CO2 24  GLUCOSE 77  BUN 20  CREATININE 1.07*  CALCIUM 9.1   GFR: Estimated Creatinine Clearance: 34.4 mL/min (A) (by C-G formula based on SCr of 1.07 mg/dL (H)). Liver Function Tests:  Recent Labs Lab 06/02/16 1659  AST 59*  ALT 42  ALKPHOS 115  BILITOT 0.9  PROT 7.3  ALBUMIN 3.2*   No results for input(s):  LIPASE, AMYLASE in the last 168 hours. No results for input(s): AMMONIA in the last 168 hours. Coagulation Profile: No results for input(s): INR, PROTIME in the last 168 hours. Cardiac Enzymes:  Recent Labs Lab 06/02/16 1659  TROPONINI <0.03   BNP (last 3 results) No results for input(s): PROBNP in the last 8760 hours. HbA1C: No results for input(s): HGBA1C in the last 72 hours. CBG: No results for input(s): GLUCAP in the last 168 hours. Lipid Profile: No results for input(s): CHOL, HDL, LDLCALC, TRIG, CHOLHDL, LDLDIRECT in the last 72 hours. Thyroid Function Tests: No results for input(s): TSH, T4TOTAL, FREET4, T3FREE, THYROIDAB in the last 72 hours. Anemia Panel: No results for input(s): VITAMINB12, FOLATE, FERRITIN, TIBC, IRON, RETICCTPCT in the last 72 hours. Urine analysis:    Component Value Date/Time   COLORURINE AMBER (A) 03/04/2013 1248   APPEARANCEUR CLOUDY (A) 03/04/2013 1248   LABSPEC 1.026 03/04/2013 1248   PHURINE  5.0 03/04/2013 1248   GLUCOSEU NEGATIVE 03/04/2013 1248   HGBUR NEGATIVE 03/04/2013 1248   BILIRUBINUR negative 03/26/2016 1628   BILIRUBINUR neg 01/02/2014 1734   KETONESUR negative 03/26/2016 1628   KETONESUR NEGATIVE 03/04/2013 1248   PROTEINUR =100 (A) 03/26/2016 1628   PROTEINUR neg 01/02/2014 1734   PROTEINUR NEGATIVE 03/04/2013 1248   UROBILINOGEN 0.2 03/26/2016 1628   UROBILINOGEN 0.2 03/04/2013 1248   NITRITE Negative 03/26/2016 1628   NITRITE neg 01/02/2014 1734   NITRITE NEGATIVE 03/04/2013 1248   LEUKOCYTESUR Negative 03/26/2016 1628    Radiological Exams on Admission: Dg Chest 2 View  Result Date: 06/02/2016 CLINICAL DATA:  83 y/o  F; cough. EXAM: CHEST  2 VIEW COMPARISON:  04/08/2016 chest radiograph. FINDINGS: Stable normal cardiac silhouette. Aortic atherosclerosis with calcification. Diffuse hazy opacification of lungs and ill-defined opacities in mid and upper lung zones. No pleural effusion or pneumothorax. Bones are unremarkable. IMPRESSION: Diffuse hazy opacification of lungs and ill-defined opacities in mid and upper lung zones may represent developing pulmonary edema or infiltrates. Electronically Signed   By: Kristine Garbe M.D.   On: 06/02/2016 16:08   Ct Chest Wo Contrast  Result Date: 06/02/2016 CLINICAL DATA:  83 y/o  F; productive cough. EXAM: CT CHEST WITHOUT CONTRAST TECHNIQUE: Multidetector CT imaging of the chest was performed following the standard protocol without IV contrast. COMPARISON:  06/02/2016 chest radiograph. FINDINGS: Cardiovascular: Normal heart size. No pericardial effusion. Normal caliber thoracic aorta. Mild coronary artery calcification. Mediastinum/Nodes: Mediastinal lymphadenopathy is likely reactive with lymph nodes measuring up to 16 x 12 mm in the AP window (series 2, image 41). The thoracic esophagus is unremarkable. Left lobe of thyroid nodule measuring 14 mm. Lungs/Pleura: Diffuse peribronchial thickening with ground-glass  opacities and clustered nodules in a bronchovascular distribution. Findings are most consistent with acute bronchopneumonia. There is the suggestion of cavitation within some pulmonary nodules, although this is somewhat difficult to distinguish from bronchiectatic changes (series 5, image 62, in the right lower lobe). No significant interlobular septal thickening to suggest pulmonary edema. No pleural effusion. Upper Abdomen: Cholecystectomy.  Small hiatal hernia. Musculoskeletal: Mild reverse S curvature of the thoracic spine and discogenic degenerative changes. No acute osseous abnormality is evident. IMPRESSION: 1. Ground-glass opacities in clustered nodules in bronchovascular distribution most compatible with acute bronchopneumonia. There is a suggestion of cavitation within the larger nodules, although this is somewhat difficult to distinguish from bronchiectatic changes. Findings suggest an atypical infection including mycobacterial or fungal pneumonia. 2. Mediastinal lymphadenopathy, probably reactive. Electronically Signed   By: Mia Creek  [place]rat[place] M.D.   On: 06/02/2016 18:40    EKG: Independently reviewed. Vent. rate 68 BPM PR interval * ms QRS duration 94 ms QT/QTc 424/451 ms P-R-T axes 38 -8 93 Sinus rhythm Prolonged PR interval Nonspecific T abnormalities, lateral leads Minimal ST elevation, anterior leads Baseline wander in lead(s) V2 V3  Assessment/Plan Principal Problem:   Atypical pneumonia Admit to telemetry/inpatient. Continue supplemental oxygen. We'll continue bronchodilators. Continue azithromycin 500 mg IVPB every 24 hours. Continue ceftriaxone 1 g IV PB every 24 hours. Check a sputum Gram stain culture and sensitivity. Follow-up blood cultures and sensitivity. Check a strep pneumonia and legionella urinary antigens.  Active Problems:   Intrinsic asthma Supplemental oxygen as needed. Continue bronchodilators.    AF (paroxysmal atrial fibrillation)  (HCC) CHA2DS2-VASc Score of at least 4. Continue amiodarone for rate control. Continue anticoagulation.    DVT prophylaxis: On Eliquis Code Status: Full code. Family Communication:  Disposition Plan: Admit for IV antibiotic therapy for several days. Consults called:  Admission status: Inpatient/telemetry.   Reubin Milan MD [facility] Pager [phone]  If 7PM-7AM, please contact night-coverage [email or web address] Password [facility]  06/02/2016, 11:43 PM

## 2016-06-02 NOTE — ED Notes (Signed)
Tolerated oral fluids and graham crackers well.

## 2016-06-02 NOTE — ED Triage Notes (Signed)
Pt c/o insidious onset productive cough with green mucus, green rhinorrhea, headache worse with laying flat x 5 days, weakness, anorexia, left eye conjunctivitis with drainage. PCP diagnosed pt with virus and instructed her to take PO fluids. Initially nausea was present but none now. No emesis, diarrhea, body aches. Bladder cancer surgery on 05/13/16.

## 2016-06-02 NOTE — Telephone Encounter (Signed)
Caller name: [name] Relationship to patient: daughter Can be reached: [phone]  Reason for call: Pt daughter calling stating pt dx with flu 06/01/16 by Dr. Larose Kells. Pt getting weaker by the minute and can barely walk. Transferred to [facility] with [facility].

## 2016-06-02 NOTE — Telephone Encounter (Signed)
Name: NARCISSA MELDER  DOB: Apr 16, 1924    Initial Comment Caller states mom dx with flu yesterday, getting weaker and weaker, can barely walk, not eating or drinking well   Nurse Assessment  Nurse: Hardin Negus, RN, Estill Bamberg Date/Time (Eastern Time): 06/02/2016 1:31:58 PM  Confirm and document reason for call. If symptomatic, describe symptoms. ---Daughter, her POA states mom dx with flu yesterday, getting weaker and weaker, can barely walk, not eating or drinking well. Denies other symptoms.  Does the patient have any new or worsening symptoms? ---Yes  Will a triage be completed? ---Yes  Related visit to physician within the last 2 weeks? ---Yes  Does the PT have any chronic conditions? (i.e. diabetes, asthma, etc.) ---Yes  List chronic conditions. ---Asthma, AFib  Is this a behavioral health or substance abuse call? ---No     Guidelines    Guideline Title Affirmed Question Affirmed Notes  Influenza Follow-up Call Patient sounds very sick or weak to the triager    Final Disposition User   Go to ED Now (or PCP triage) Hardin Negus, RN, Estill Bamberg    Referrals  REFERRED TO PCP OFFICE  [facility] - ED   Disagree/Comply: Comply

## 2016-06-02 NOTE — ED Notes (Signed)
Pt states she was diagnosed with the flu yesterday.

## 2016-06-02 NOTE — ED Notes (Signed)
Per pt's daughter, "she ate two bites of graham crackers and wouldn't eat any more. She did drink some water thought." This writer did observe pt take a a few sips of water

## 2016-06-03 DIAGNOSIS — J189 Pneumonia, unspecified organism: Secondary | ICD-10-CM

## 2016-06-03 DIAGNOSIS — I480 Paroxysmal atrial fibrillation: Secondary | ICD-10-CM

## 2016-06-03 DIAGNOSIS — J45909 Unspecified asthma, uncomplicated: Secondary | ICD-10-CM

## 2016-06-03 DIAGNOSIS — H1031 Unspecified acute conjunctivitis, right eye: Secondary | ICD-10-CM

## 2016-06-03 LAB — CBC WITH DIFFERENTIAL/PLATELET
BASOS PCT: 0 %
Basophils Absolute: 0 10*3/uL (ref 0.0–0.1)
EOS ABS: 0.1 10*3/uL (ref 0.0–0.7)
EOS PCT: 1 %
HCT: 31.5 % — ABNORMAL LOW (ref 36.0–46.0)
HEMOGLOBIN: 10.4 g/dL — AB (ref 12.0–15.0)
Lymphocytes Relative: 11 %
Lymphs Abs: 1.3 10*3/uL (ref 0.7–4.0)
MCH: 27.4 pg (ref 26.0–34.0)
MCHC: 33 g/dL (ref 30.0–36.0)
MCV: 82.9 fL (ref 78.0–100.0)
MONOS PCT: 12 %
Monocytes Absolute: 1.4 10*3/uL — ABNORMAL HIGH (ref 0.1–1.0)
NEUTROS PCT: 76 %
Neutro Abs: 9.2 10*3/uL — ABNORMAL HIGH (ref 1.7–7.7)
PLATELETS: 300 10*3/uL (ref 150–400)
RBC: 3.8 MIL/uL — ABNORMAL LOW (ref 3.87–5.11)
RDW: 15.4 % (ref 11.5–15.5)
WBC: 12.1 10*3/uL — ABNORMAL HIGH (ref 4.0–10.5)

## 2016-06-03 LAB — BASIC METABOLIC PANEL
Anion gap: 11 (ref 5–15)
BUN: 20 mg/dL (ref 6–20)
CHLORIDE: 98 mmol/L — AB (ref 101–111)
CO2: 21 mmol/L — ABNORMAL LOW (ref 22–32)
Calcium: 8.4 mg/dL — ABNORMAL LOW (ref 8.9–10.3)
Creatinine, Ser: 1.13 mg/dL — ABNORMAL HIGH (ref 0.44–1.00)
GFR, EST AFRICAN AMERICAN: 48 mL/min — AB (ref >60)
GFR, EST NON AFRICAN AMERICAN: 42 mL/min — AB (ref >60)
GLUCOSE: 80 mg/dL (ref 65–99)
Potassium: 3.6 mmol/L (ref 3.5–5.1)
SODIUM: 130 mmol/L — AB (ref 135–145)

## 2016-06-03 MED ORDER — SODIUM CHLORIDE 0.9 % IV SOLN
INTRAVENOUS | Status: AC
  Administered 2016-06-03: 11:00:00 via INTRAVENOUS

## 2016-06-03 MED ORDER — GUAIFENESIN ER 600 MG PO TB12
1200.0000 mg | ORAL_TABLET | Freq: Two times a day (BID) | ORAL | Status: DC
  Administered 2016-06-03 – 2016-06-05 (×5): 1200 mg via ORAL
  Filled 2016-06-03 (×5): qty 2

## 2016-06-03 MED ORDER — SODIUM CHLORIDE 0.9 % IV SOLN
INTRAVENOUS | Status: DC
Start: 2016-06-03 — End: 2016-06-05

## 2016-06-03 MED ORDER — ORAL CARE MOUTH RINSE
15.0000 mL | Freq: Two times a day (BID) | OROMUCOSAL | Status: DC
  Administered 2016-06-03 – 2016-06-05 (×4): 15 mL via OROMUCOSAL

## 2016-06-03 NOTE — Care Management Note (Signed)
Case Management Note  Patient Details  Name: Crystal Kerr MRN: [id number] Date of Birth: [birth year]  Subjective/Objective: 83 y/o f admitted w/atypical pneumonia. From home. Hx: chf,afib,asthma. PT cons-await recc.                   Action/Plan:d/c plan home.   Expected Discharge Date:   (unknown)               Expected Discharge Plan:  Home/Self Care  In-House Referral:     Discharge planning Services  CM Consult  Post Acute Care Choice:    Choice offered to:     DME Arranged:    DME Agency:     HH Arranged:    HH Agency:     Status of Service:  In process, will continue to follow  If discussed at Long Length of Stay Meetings, dates discussed:    Additional Comments:  Dessa Phi, RN 06/03/2016, 12:30 PM

## 2016-06-03 NOTE — Evaluation (Signed)
Occupational Therapy Evaluation Patient Details Name: Crystal Kerr MRN: [id number] DOB: [birth year] Today's Date: 06/03/2016    History of Present Illness Crystal Kerr is a 83 y.o. female with medical history significant of osteoarthritis, asthma, paroxysmal atrial fibrillation on Eliquis, CHF, PVCs who is coming to the emergency department with complaints of brown sputum producing cough, dyspnea, wheezing, decreased appetite, increased somnolence, fatigue and malaise   Clinical Impression   Pt admitted with overall weakness and decreased ability to perform ADL activity . Pt currently with functional limitations due to the deficits listed below (see OT Problem List). Pt will benefit from skilled OT to increase their safety and independence with ADL and functional mobility for ADL to facilitate discharge to venue listed below.      Follow Up Recommendations  SNF    Equipment Recommendations  None recommended by OT           Mobility Bed Mobility Overal bed mobility: Needs Assistance Bed Mobility: Supine to Sit     Supine to sit: Max assist;HOB elevated        Transfers Overall transfer level: Needs assistance   Transfers: Sit to/from Stand Sit to Stand: Max assist         General transfer comment: pt did stand briefly to change pad    Balance        sitting balance- mod/ max A sitting EOB. Pt with L and posterior lean.                                   ADL either performed or assessed with clinical judgement   ADL Overall ADL's : Needs assistance/impaired Eating/Feeding: Sitting;Moderate assistance   Grooming: Sitting;Moderate assistance                   Toilet Transfer: +2 for safety/equipment;+2 for physical assistance;Maximal assistance Toilet Transfer Details (indicate cue type and reason): sit to stand briefly Toileting- Clothing Manipulation and Hygiene: +2 for physical assistance;Total assistance;Bed  level;Cueing for safety         General ADL Comments: Pt sat EOB for approx 5 minutes. Pt with L and posterior lean.  Pt mod A for sitting balance. Pt fatigued VERY quickly and returned to supine     Vision Patient Visual Report: No change from baseline              Pertinent Vitals/Pain Pain Assessment: 0-10 Pain Score: 3  Pain Location: abdomen Pain Descriptors / Indicators: Discomfort Pain Intervention(s): Monitored during session;Repositioned;Limited activity within patient's tolerance     Hand Dominance     Extremity/Trunk Assessment Upper Extremity Assessment Upper Extremity Assessment: Generalized weakness           Communication     Cognition Arousal/Alertness: Awake/alert Behavior During Therapy: WFL for tasks assessed/performed Overall Cognitive Status: Within Functional Limits for tasks assessed                                     General Comments   pt aware she is not able to care for self. Will need  ST SNF            Home Living Family/patient expects to be discharged to:: Skilled nursing facility  Prior Functioning/Environment           Pt was I  In her one level home        OT Problem List: Decreased strength;Decreased activity tolerance;Decreased safety awareness;Decreased knowledge of use of DME or AE;Impaired balance (sitting and/or standing)      OT Treatment/Interventions: Self-care/ADL training;DME and/or AE instruction;Patient/family education;Therapeutic activities    OT Goals(Current goals can be found in the care plan section) Acute Rehab OT Goals Patient Stated Goal: get stonger to go home  OT Goal Formulation: With patient Time For Goal Achievement: 06/17/16 Potential to Achieve Goals: Good  OT Frequency: Min 2X/week   Barriers to D/C: Decreased caregiver support  pt lives alone and was I       Co-evaluation              AM-PAC PT "6  Clicks" Daily Activity     Outcome Measure Help from another person eating meals?: A Little Help from another person taking care of personal grooming?: A Lot Help from another person toileting, which includes using toliet, bedpan, or urinal?: Total Help from another person bathing (including washing, rinsing, drying)?: A Lot Help from another person to put on and taking off regular upper body clothing?: Total Help from another person to put on and taking off regular lower body clothing?: Total 6 Click Score: 10   End of Session Equipment Utilized During Treatment: Gait belt Nurse Communication: Mobility status  Activity Tolerance: Patient limited by fatigue Patient left: in bed;with call bell/phone within reach;with bed alarm set;with family/visitor present  OT Visit Diagnosis: Unsteadiness on feet (R26.81);Muscle weakness (generalized) (M62.81)                Time: 9323-5573 OT Time Calculation (min): 18 min Charges:  OT General Charges $OT Visit: 1 Procedure OT Evaluation $OT Eval Moderate Complexity: 1 Procedure G-Codes:     Kari Baars, [place]  Payton Mccallum D 06/03/2016, 2:11 PM

## 2016-06-03 NOTE — Progress Notes (Signed)
Nutrition Brief Note  Patient identified on the Malnutrition Screening Tool (MST) Report  Wt Readings from Last 15 Encounters:  06/02/16 164 lb 0.4 oz (74.4 kg)  06/01/16 162 lb 8 oz (73.7 kg)  05/04/16 168 lb 12.8 oz (76.6 kg)  04/09/16 169 lb 9.6 oz (76.9 kg)  04/08/16 168 lb 1.9 oz (76.3 kg)  03/26/16 169 lb (76.7 kg)  02/12/16 166 lb 3.2 oz (75.4 kg)  12/25/15 165 lb 3.2 oz (74.9 kg)  09/27/15 168 lb 6.4 oz (76.4 kg)  09/18/15 169 lb 12.8 oz (77 kg)  09/12/15 170 lb (77.1 kg)  05/23/15 178 lb (80.7 kg)  04/30/15 169 lb 12.8 oz (77 kg)  04/23/15 171 lb 6.4 oz (77.7 kg)  02/09/15 172 lb (78 kg)    Body mass index is 29.06 kg/m. Patient meets criteria for overweight based on current BMI. Pt has lost 4 lbs (2.3% body weight) in the past 1 month which is not significant for time frame. Skin WDL. Per H&P, pt admitted for productive cough, dyspnea, wheezing, decreased appetite, increased somnolence, fatigue and malaise. She denies diarrhea at this time, but stated that about a week or so ago she had a "stomach flu" with some loose stools. Current diet order is Heart Healthy. Labs and medications reviewed.   Ensure Enlive has already been ordered BID per ONS protocol; each supplement provides 350 kcal and 20 grams of protein. Pt consumed an Ensure this AM. No further nutrition interventions warranted at this time. If nutrition issues arise, please consult RD.     Jarome Matin, MS, RD, LDN, [facility] Inpatient Clinical Dietitian Pager # [phone] After hours/weekend pager # [phone]

## 2016-06-03 NOTE — Progress Notes (Signed)
PROGRESS NOTE    Crystal Kerr  VZD:[id number] DOB: [birth year] DOA: 06/02/2016 PCP: [name], MD  Brief Narrative:  Crystal Kerr is a 83 y.o. female with medical history significant of osteoarthritis, asthma, paroxysmal atrial fibrillation on Eliquis, CHF, PVCs and other comorbid's who is coming to the Emergency Department with complaints of brown sputum producing cough, dyspnea, wheezing, decreased appetite, increased somnolence, fatigue and malaise. She denied diarrhea at this time, but stated that about a week or so ago she had a "stomach flu" with some loose stools. She denies abdominal pain, nausea, emesis, melena or hematochezia. She denies dysuria or hematuria. Chest CT was done and showed groundglass opacities and cluster nodules in bronchovascular distribution most compatible with acute bronchopneumonia and compatible with Atypical Pneumonia.    Assessment & Plan:   Principal Problem:   Atypical pneumonia Active Problems:   Intrinsic asthma   AF (paroxysmal atrial fibrillation) (HCC)  Atypical Pneumonia -CT of Chest showed Ground-glass opacities in clustered nodules in bronchovasculardistribution most compatible with acute bronchopneumonia. There is a suggestion of cavitation within the larger nodules, although this is somewhat difficult to distinguish from bronchiectatic changes. Findings suggest an atypical infection including mycobacterial or fungal pneumonia. Mediastinal lymphadenopathy, probably reactive. -Admitted to telemetry/inpatient. -Continue supplemental oxygen. We'll continue bronchodilators. -Continue Azithromycin 500 mg IVPB and Ceftriaxone 1 g IV PB every 24 hours. -Check a sputum Gram stain culture and sensitivity (pending). -Check a strep pneumonia and legionella urinary antigens. -C/w Xopenex/Atrovent 4 times Daily -Added Mucinex  -WBC went from 15.0 -> 12.1 -? Amiodarone Toxicity with Ground Glass opacities -Will need to discuss  with Cardiology; Patient's Cardiologist is [name]   Intrinsic Asthma -Supplemental oxygen as needed. -Continue bronchodilators with Dulera 2 puff RT BID and Montelukast 10 mg po Daily -C/w Nebs as above -No appreciable wheezing but has crackles and Rhonchi  AF (paroxysmal atrial fibrillation) (HCC) -CHA2DS2-VASc Score of at least 4. -Continue Amiodarone for rate control. -Continue Anticoagulation with Apixaban 5 mg po BID  GERD -C/w Famotidine 20 mg po BID  Right Eye Conjunctivitis -C/w Polysporin in Right Eye 4 times a day  Hx of Bladder Cancer  -Follow up as an outpatient  Hx of Diastolic CHF -Not Decompensated -Continue to Monitor Volume Status -Strict I's and O's and Daily Weights  DVT prophylaxis: Anticoagulated with Apixaban Code Status: FULL CODE Family Communication: No family present at bedside Disposition Plan: (specify when and where you expect patient to be discharged). Include barriers to DC in this tab.   Consultants:   None   Procedures: None   Antimicrobials:  Anti-infectives    Start     Dose/Rate Route Frequency Ordered Stop   06/03/16 0800  cefTRIAXone (ROCEPHIN) 1 g in dextrose 5 % 50 mL IVPB     1 g 100 mL/hr over 30 Minutes Intravenous Every 24 hours 06/02/16 2157 06/10/16 0759   06/03/16 0800  azithromycin (ZITHROMAX) 500 mg in dextrose 5 % 250 mL IVPB     500 mg 250 mL/hr over 60 Minutes Intravenous Every 24 hours 06/02/16 2157 06/10/16 0759   06/02/16 1900  cefTRIAXone (ROCEPHIN) 1 g in dextrose 5 % 50 mL IVPB     1 g 100 mL/hr over 30 Minutes Intravenous  Once 06/02/16 1853 06/02/16 2020   06/02/16 1900  azithromycin (ZITHROMAX) 500 mg in dextrose 5 % 250 mL IVPB     500 mg 250 mL/hr over 60 Minutes Intravenous  Once 06/02/16 1853 06/02/16 2124  Subjective: Seen and examined and was coughing up green mucous. States she feels slightly better. No CP. No nausea or vomiting. No other concerns or complaints at this time.    Objective: Vitals:   06/02/16 2200 06/02/16 2300 06/03/16 0007 06/03/16 0459  BP: (!) 161/54   (!) 141/45  Pulse: 69   61  Resp: (!) 24   (!) 22  Temp: (!) 101.6 F (38.7 C)  (!) 100.4 F (38 C) 98.3 F (36.8 C)  TempSrc: Oral  Oral Oral  SpO2: 92% 93%  93%  Weight: 74.4 kg (164 lb 0.4 oz)     Height: 5\' 3"  (1.6 m)       Intake/Output Summary (Last 24 hours) at 06/03/16 0830 Last data filed at 06/03/16 0600  Gross per 24 hour  Intake              540 ml  Output                2 ml  Net              538 ml   Filed Weights   06/02/16 1515 06/02/16 2200  Weight: 74.8 kg (165 lb) 74.4 kg (164 lb 0.4 oz)   Examination: Physical Exam:  Constitutional: Elderly female NAD and appears calm and comfortable Eyes: Right Eye has some injection and discharge ENMT: External Ears, Nose appear normal. Difficulty hearing Neck: Appears normal, supple, no cervical masses, normal ROM, no appreciable thyromegaly, no JVD Respiratory: Diminished to auscultation bilaterally with some crackles and rhonchi. Normal respiratory effort and patient is not tachypenic. No accessory muscle use.  Cardiovascular: RRR, no murmurs / rubs / gallops. S1 and S2 auscultated. No extremity edema.  Abdomen: Soft, non-tender, non-distended. No masses palpated. No appreciable hepatosplenomegaly. Bowel sounds positive x4.  GU: Deferred. Musculoskeletal: No clubbing / cyanosis of digits/nails. No joint deformity upper and lower extremities.  Skin: No rashes, lesions, ulcers on limited skin evaluation. No induration; Warm and dry.  Neurologic: CN 2-12 grossly intact with no focal deficits. Romberg sign cerebellar reflexes not assessed.  Psychiatric: Normal judgment and insight. Alert and oriented x 3. Normal mood and appropriate affect.   Data Reviewed: I have personally reviewed following labs and imaging studies  CBC:  Recent Labs Lab 06/02/16 1659 06/03/16 0601  WBC 15.0* 12.1*  NEUTROABS 12.4* 9.2*  HGB  12.0 10.4*  HCT 36.4 31.5*  MCV 83.3 82.9  PLT 366 979   Basic Metabolic Panel:  Recent Labs Lab 06/02/16 1659 06/03/16 0601  NA 133* 130*  K 4.3 3.6  CL 96* 98*  CO2 24 21*  GLUCOSE 77 80  BUN 20 20  CREATININE 1.07* 1.13*  CALCIUM 9.1 8.4*   GFR: Estimated Creatinine Clearance: 32.6 mL/min (A) (by C-G formula based on SCr of 1.13 mg/dL (H)). Liver Function Tests:  Recent Labs Lab 06/02/16 1659  AST 59*  ALT 42  ALKPHOS 115  BILITOT 0.9  PROT 7.3  ALBUMIN 3.2*   No results for input(s): LIPASE, AMYLASE in the last 168 hours. No results for input(s): AMMONIA in the last 168 hours. Coagulation Profile: No results for input(s): INR, PROTIME in the last 168 hours. Cardiac Enzymes:  Recent Labs Lab 06/02/16 1659  TROPONINI <0.03   BNP (last 3 results) No results for input(s): PROBNP in the last 8760 hours. HbA1C: No results for input(s): HGBA1C in the last 72 hours. CBG: No results for input(s): GLUCAP in the last 168 hours. Lipid Profile:  No results for input(s): CHOL, HDL, LDLCALC, TRIG, CHOLHDL, LDLDIRECT in the last 72 hours. Thyroid Function Tests: No results for input(s): TSH, T4TOTAL, FREET4, T3FREE, THYROIDAB in the last 72 hours. Anemia Panel: No results for input(s): VITAMINB12, FOLATE, FERRITIN, TIBC, IRON, RETICCTPCT in the last 72 hours. Sepsis Labs: No results for input(s): PROCALCITON, LATICACIDVEN in the last 168 hours.  No results found for this or any previous visit (from the past 240 hour(s)).   Radiology Studies: Dg Chest 2 View  Result Date: 06/02/2016 CLINICAL DATA:  83 y/o  F; cough. EXAM: CHEST  2 VIEW COMPARISON:  04/08/2016 chest radiograph. FINDINGS: Stable normal cardiac silhouette. Aortic atherosclerosis with calcification. Diffuse hazy opacification of lungs and ill-defined opacities in mid and upper lung zones. No pleural effusion or pneumothorax. Bones are unremarkable. IMPRESSION: Diffuse hazy opacification of lungs and  ill-defined opacities in mid and upper lung zones may represent developing pulmonary edema or infiltrates. Electronically Signed   By: Kristine Garbe M.D.   On: 06/02/2016 16:08   Ct Chest Wo Contrast  Result Date: 06/02/2016 CLINICAL DATA:  83 y/o  F; productive cough. EXAM: CT CHEST WITHOUT CONTRAST TECHNIQUE: Multidetector CT imaging of the chest was performed following the standard protocol without IV contrast. COMPARISON:  06/02/2016 chest radiograph. FINDINGS: Cardiovascular: Normal heart size. No pericardial effusion. Normal caliber thoracic aorta. Mild coronary artery calcification. Mediastinum/Nodes: Mediastinal lymphadenopathy is likely reactive with lymph nodes measuring up to 16 x 12 mm in the AP window (series 2, image 41). The thoracic esophagus is unremarkable. Left lobe of thyroid nodule measuring 14 mm. Lungs/Pleura: Diffuse peribronchial thickening with ground-glass opacities and clustered nodules in a bronchovascular distribution. Findings are most consistent with acute bronchopneumonia. There is the suggestion of cavitation within some pulmonary nodules, although this is somewhat difficult to distinguish from bronchiectatic changes (series 5, image 62, in the right lower lobe). No significant interlobular septal thickening to suggest pulmonary edema. No pleural effusion. Upper Abdomen: Cholecystectomy.  Small hiatal hernia. Musculoskeletal: Mild reverse S curvature of the thoracic spine and discogenic degenerative changes. No acute osseous abnormality is evident. IMPRESSION: 1. Ground-glass opacities in clustered nodules in bronchovascular distribution most compatible with acute bronchopneumonia. There is a suggestion of cavitation within the larger nodules, although this is somewhat difficult to distinguish from bronchiectatic changes. Findings suggest an atypical infection including mycobacterial or fungal pneumonia. 2. Mediastinal lymphadenopathy, probably reactive.  Electronically Signed   By: Kristine Garbe M.D.   On: 06/02/2016 18:40   Scheduled Meds: . amiodarone  200 mg Oral Daily  . apixaban  5 mg Oral BID  . bacitracin-polymyxin b   Right Eye QID  . diltiazem  120 mg Oral Daily  . famotidine  20 mg Oral BID  . feeding supplement (ENSURE ENLIVE)  237 mL Oral BID BM  . ipratropium  0.5 mg Nebulization QID  . levalbuterol  0.63 mg Nebulization QID  . mouth rinse  15 mL Mouth Rinse BID  . mometasone-formoterol  2 puff Inhalation BID  . montelukast  10 mg Oral Daily  . multivitamin with minerals  1 tablet Oral Daily  . pantoprazole  40 mg Oral Daily   Continuous Infusions: . azithromycin    . cefTRIAXone (ROCEPHIN)  IV 1 g (06/03/16 0756)    LOS: 1 day   Kerney Elbe, DO [facility] Pager [phone]  If 7PM-7AM, please contact night-coverage [email or web address] Password [email or web address] 06/03/2016, 8:30 AM

## 2016-06-04 DIAGNOSIS — R0602 Shortness of breath: Secondary | ICD-10-CM

## 2016-06-04 DIAGNOSIS — R0600 Dyspnea, unspecified: Secondary | ICD-10-CM

## 2016-06-04 LAB — BASIC METABOLIC PANEL
ANION GAP: 8 (ref 5–15)
BUN: 14 mg/dL (ref 6–20)
CALCIUM: 8.4 mg/dL — AB (ref 8.9–10.3)
CO2: 24 mmol/L (ref 22–32)
Chloride: 101 mmol/L (ref 101–111)
Creatinine, Ser: 0.85 mg/dL (ref 0.44–1.00)
GFR calc Af Amer: >60 mL/min (ref >60)
GFR calc non Af Amer: 59 mL/min — ABNORMAL LOW (ref >60)
Glucose, Bld: 83 mg/dL (ref 65–99)
Potassium: 3.5 mmol/L (ref 3.5–5.1)
Sodium: 133 mmol/L — ABNORMAL LOW (ref 135–145)

## 2016-06-04 LAB — CBC WITH DIFFERENTIAL/PLATELET
BASOS PCT: 1 %
Basophils Absolute: 0.1 10*3/uL (ref 0.0–0.1)
EOS PCT: 1 %
Eosinophils Absolute: 0.1 10*3/uL (ref 0.0–0.7)
HCT: 33.3 % — ABNORMAL LOW (ref 36.0–46.0)
Hemoglobin: 10.8 g/dL — ABNORMAL LOW (ref 12.0–15.0)
LYMPHS ABS: 1.3 10*3/uL (ref 0.7–4.0)
Lymphocytes Relative: 14 %
MCH: 26.8 pg (ref 26.0–34.0)
MCHC: 32.4 g/dL (ref 30.0–36.0)
MCV: 82.6 fL (ref 78.0–100.0)
MONOS PCT: 15 %
Monocytes Absolute: 1.4 10*3/uL — ABNORMAL HIGH (ref 0.1–1.0)
NEUTROS ABS: 6.2 10*3/uL (ref 1.7–7.7)
Neutrophils Relative %: 69 %
Platelets: 316 10*3/uL (ref 150–400)
RBC: 4.03 MIL/uL (ref 3.87–5.11)
RDW: 15.2 % (ref 11.5–15.5)
WBC: 9.1 10*3/uL (ref 4.0–10.5)

## 2016-06-04 LAB — RESPIRATORY PANEL BY PCR
Adenovirus: NOT DETECTED
BORDETELLA PERTUSSIS-RVPCR: NOT DETECTED
CORONAVIRUS HKU1-RVPPCR: NOT DETECTED
CORONAVIRUS NL63-RVPPCR: NOT DETECTED
Chlamydophila pneumoniae: NOT DETECTED
Coronavirus 229E: NOT DETECTED
Coronavirus OC43: NOT DETECTED
Influenza A: NOT DETECTED
Influenza B: NOT DETECTED
METAPNEUMOVIRUS-RVPPCR: NOT DETECTED
Mycoplasma pneumoniae: NOT DETECTED
PARAINFLUENZA VIRUS 1-RVPPCR: NOT DETECTED
PARAINFLUENZA VIRUS 4-RVPPCR: NOT DETECTED
Parainfluenza Virus 2: NOT DETECTED
Parainfluenza Virus 3: NOT DETECTED
RESPIRATORY SYNCYTIAL VIRUS-RVPPCR: NOT DETECTED
RHINOVIRUS / ENTEROVIRUS - RVPPCR: DETECTED — AB

## 2016-06-04 MED ORDER — METHYLPREDNISOLONE SODIUM SUCC 125 MG IJ SOLR
60.0000 mg | Freq: Two times a day (BID) | INTRAMUSCULAR | Status: DC
  Administered 2016-06-04 – 2016-06-05 (×3): 60 mg via INTRAVENOUS
  Filled 2016-06-04 (×3): qty 2

## 2016-06-04 MED ORDER — SODIUM CHLORIDE 3 % IN NEBU
4.0000 mL | INHALATION_SOLUTION | Freq: Every day | RESPIRATORY_TRACT | Status: DC
  Administered 2016-06-04: 4 mL via RESPIRATORY_TRACT
  Filled 2016-06-04 (×2): qty 4

## 2016-06-04 NOTE — Care Management Note (Signed)
Case Management Note  Patient Details  Name: Crystal Kerr MRN: [id number] Date of Birth: 07/08/1924  Subjective/Objective: PT recc SNF-CSW already following for SNF.                   Action/Plan:d/c SNF   Expected Discharge Date:   (unknown)               Expected Discharge Plan:  [place]  In-House Referral:  Clinical Social Work  Discharge planning Services  CM Consult  Post Acute Care Choice:    Choice offered to:     DME Arranged:    DME Agency:     HH Arranged:    [place] Agency:     Status of Service:  In process, will continue to follow  If discussed at Long Length of Stay Meetings, dates discussed:    Additional Comments:  Dessa Phi, RN 06/04/2016, 3:01 PM

## 2016-06-04 NOTE — Progress Notes (Signed)
PROGRESS NOTE    Crystal Kerr  WEX:[id number] DOB: [birth year] DOA: 06/02/2016 PCP: [name], MD  Brief Narrative:  Crystal Kerr is a 83 y.o. female with medical history significant of osteoarthritis, asthma, paroxysmal atrial fibrillation on Eliquis, CHF, PVCs and other comorbid's who is coming to the Emergency Department with complaints of brown sputum producing cough, dyspnea, wheezing, decreased appetite, increased somnolence, fatigue and malaise. She denied diarrhea at this time, but stated that about a week or so ago she had a "stomach flu" with some loose stools. She denies abdominal pain, nausea, emesis, melena or hematochezia. She denies dysuria or hematuria. Chest CT was done and showed groundglass opacities and cluster nodules in bronchovascular distribution most compatible with acute bronchopneumonia and compatible with Atypical Pneumonia.  Patient is slowly improving and states she is still coughing up copious amount of sputum.   Assessment & Plan:   Principal Problem:   Atypical pneumonia Active Problems:   Dyspnea   Intrinsic asthma   Shortness of breath   AF (paroxysmal atrial fibrillation) (HCC)  Atypical Pneumonia -CT of Chest showed Ground-glass opacities in clustered nodules in bronchovasculardistribution most compatible with acute bronchopneumonia. There is a suggestion of cavitation within the larger nodules, although this is somewhat difficult to distinguish from bronchiectatic changes. Findings suggest an atypical infection including mycobacterial or fungal pneumonia. Mediastinal lymphadenopathy, probably reactive. -Admitted to telemetry/inpatient. -Continue supplemental oxygen. We'll continue bronchodilators. -Continue Azithromycin 500 mg IVPB and Ceftriaxone 1 g IV PB every 24 hours. -Check a sputum Gram stain culture and sensitivity (pending). -Check a strep pneumonia and legionella urinary antigens. -Check Respiratory Viral Panel  and place on Droplet Precautions -C/w Xopenex/Atrovent 4 times Daily -C/w Mucinex 1200 mg po BID added yesterday -WBC went from 15.0 -> 12.1 -> 9.1 -Added Flutter Valve and Incentive Spirometer -Added IV Solumedrol 60 mg q12h as patient was wheezing -Added Hypertonic Saline Nebs to Therapy -Will repeat CXR in AM  -? Amiodarone Toxicity with Ground Glass opacities -Will need to discuss with Cardiology; Patient's Cardiologist is [name]   Intrinsic Asthma -Supplemental oxygen as needed. -Continue bronchodilators with Dulera 2 puff RT BID and Montelukast 10 mg po Daily -C/w Nebs as above -Had wheezing this AM so added IV Solumedrol  AF (paroxysmal atrial fibrillation) (HCC) -CHA2DS2-VASc Score of at least 4. -Continue Amiodarone for rate control for now; Will discuss with Cardiology about possible Amiodarone Toxicity -C/w Diltiazem 120 mg po Daily  -Continue Anticoagulation with Apixaban 5 mg po BID  GERD -C/w Famotidine 20 mg po BID and with Pantoprazole 40 mg po Daily  Right Eye Conjunctivitis -C/w Polysporin in Right Eye 4 times a day  Hx of Bladder Cancer  -Had some blood tinged urine this Afternoon; Continue to To Monitor  -Follow up as an outpatient with [facility] -Recent office visit for Hematuria on 05/18/16 -Will attempt to get Office Visit notes   Hx of Diastolic CHF -Not Decompensated -Continue to Monitor Volume Status -Strict I's and O's and Daily Weights -I's and O's are not accurate since patient is incontinent  Hypertension -Patient's BP was elevated -C/w Cardizem 120 mg po Daily -When needed will add IV Hydralazine 5 mg q6hprn for SBP > 180 or DBP > 100  DVT prophylaxis: Anticoagulated with Apixaban Code Status: FULL CODE Family Communication: No family present at bedside Disposition Plan: SNF when medically stable   Consultants:   None   Procedures: None   Antimicrobials:  Anti-infectives    Start  Dose/Rate Route Frequency  Ordered Stop   06/03/16 0800  cefTRIAXone (ROCEPHIN) 1 g in dextrose 5 % 50 mL IVPB     1 g 100 mL/hr over 30 Minutes Intravenous Every 24 hours 06/02/16 2157 06/10/16 0759   06/03/16 0800  azithromycin (ZITHROMAX) 500 mg in dextrose 5 % 250 mL IVPB     500 mg 250 mL/hr over 60 Minutes Intravenous Every 24 hours 06/02/16 2157 06/10/16 0759   06/02/16 1900  cefTRIAXone (ROCEPHIN) 1 g in dextrose 5 % 50 mL IVPB     1 g 100 mL/hr over 30 Minutes Intravenous  Once 06/02/16 1853 06/02/16 2020   06/02/16 1900  azithromycin (ZITHROMAX) 500 mg in dextrose 5 % 250 mL IVPB     500 mg 250 mL/hr over 60 Minutes Intravenous  Once 06/02/16 1853 06/02/16 2124     Subjective: Seen and examined and felt a little better. Per nurse patient is still incontinent. No nausea or vomiting but states she has had a poor appetite. No CP and still coughing up green sputum. No other concerns or complaints at this time.   Objective: Vitals:   06/03/16 2055 06/04/16 0553 06/04/16 0751 06/04/16 1206  BP: (!) 146/43 (!) 174/46    Pulse: 68 61    Resp: 18 18    Temp: 98.4 F (36.9 C) 98.6 F (37 C)    TempSrc: Oral Oral    SpO2: 95% 93% 95% 96%  Weight:      Height:        Intake/Output Summary (Last 24 hours) at 06/04/16 1454 Last data filed at 06/04/16 0600  Gross per 24 hour  Intake              505 ml  Output                0 ml  Net              505 ml   Filed Weights   06/02/16 1515 06/02/16 2200  Weight: 74.8 kg (165 lb) 74.4 kg (164 lb 0.4 oz)   Examination: Physical Exam:  Constitutional: Elderly Caucasian female in NAD Eyes: Right Eye had some conjuntival injection. Lids normal ENMT: Has trouble hearing. External Ears and Nose appear normal. Mucous membranes appear moist.  Neck: Supple with no visible lymphadenopathy or thyromegaly  Respiratory: Diminished with scattered rhonchi and wheezing bilaterally. Patient was not tachypenic or using any accessory muscles to breathe Cardiovascular:  RRR, S1, S2, mild lower extremity edema Abdomen: Soft, nontender, slightly distended due to body habitus. Bowel Sounds present x4. GU: Deferred Musculoskeletal: No Contractures. No Cyanosis. Skin: Warm and dry; No rashes noticed on limited skin exam Neurologic: CN 2-12 grossly intact. No focal deficits appreciated  Psychiatric: Pleasant mood and affect. Intact judgement and insight. Awake and alert.   Data Reviewed: I have personally reviewed following labs and imaging studies  CBC:  Recent Labs Lab 06/02/16 1659 06/03/16 0601 06/04/16 0525  WBC 15.0* 12.1* 9.1  NEUTROABS 12.4* 9.2* 6.2  HGB 12.0 10.4* 10.8*  HCT 36.4 31.5* 33.3*  MCV 83.3 82.9 82.6  PLT 366 300 161   Basic Metabolic Panel:  Recent Labs Lab 06/02/16 1659 06/03/16 0601 06/04/16 0525  NA 133* 130* 133*  K 4.3 3.6 3.5  CL 96* 98* 101  CO2 24 21* 24  GLUCOSE 77 80 83  BUN 20 20 14   CREATININE 1.07* 1.13* 0.85  CALCIUM 9.1 8.4* 8.4*   GFR: Estimated Creatinine Clearance:  43.4 mL/min (by C-G formula based on SCr of 0.85 mg/dL). Liver Function Tests:  Recent Labs Lab 06/02/16 1659  AST 59*  ALT 42  ALKPHOS 115  BILITOT 0.9  PROT 7.3  ALBUMIN 3.2*   No results for input(s): LIPASE, AMYLASE in the last 168 hours. No results for input(s): AMMONIA in the last 168 hours. Coagulation Profile: No results for input(s): INR, PROTIME in the last 168 hours. Cardiac Enzymes:  Recent Labs Lab 06/02/16 1659  TROPONINI <0.03   BNP (last 3 results) No results for input(s): PROBNP in the last 8760 hours. HbA1C: No results for input(s): HGBA1C in the last 72 hours. CBG: No results for input(s): GLUCAP in the last 168 hours. Lipid Profile: No results for input(s): CHOL, HDL, LDLCALC, TRIG, CHOLHDL, LDLDIRECT in the last 72 hours. Thyroid Function Tests: No results for input(s): TSH, T4TOTAL, FREET4, T3FREE, THYROIDAB in the last 72 hours. Anemia Panel: No results for input(s): VITAMINB12, FOLATE,  FERRITIN, TIBC, IRON, RETICCTPCT in the last 72 hours. Sepsis Labs: No results for input(s): PROCALCITON, LATICACIDVEN in the last 168 hours.  No results found for this or any previous visit (from the past 240 hour(s)).   Radiology Studies: Dg Chest 2 View  Result Date: 06/02/2016 CLINICAL DATA:  83 y/o  F; cough. EXAM: CHEST  2 VIEW COMPARISON:  04/08/2016 chest radiograph. FINDINGS: Stable normal cardiac silhouette. Aortic atherosclerosis with calcification. Diffuse hazy opacification of lungs and ill-defined opacities in mid and upper lung zones. No pleural effusion or pneumothorax. Bones are unremarkable. IMPRESSION: Diffuse hazy opacification of lungs and ill-defined opacities in mid and upper lung zones may represent developing pulmonary edema or infiltrates. Electronically Signed   By: Kristine Garbe M.D.   On: 06/02/2016 16:08   Ct Chest Wo Contrast  Result Date: 06/02/2016 CLINICAL DATA:  83 y/o  F; productive cough. EXAM: CT CHEST WITHOUT CONTRAST TECHNIQUE: Multidetector CT imaging of the chest was performed following the standard protocol without IV contrast. COMPARISON:  06/02/2016 chest radiograph. FINDINGS: Cardiovascular: Normal heart size. No pericardial effusion. Normal caliber thoracic aorta. Mild coronary artery calcification. Mediastinum/Nodes: Mediastinal lymphadenopathy is likely reactive with lymph nodes measuring up to 16 x 12 mm in the AP window (series 2, image 41). The thoracic esophagus is unremarkable. Left lobe of thyroid nodule measuring 14 mm. Lungs/Pleura: Diffuse peribronchial thickening with ground-glass opacities and clustered nodules in a bronchovascular distribution. Findings are most consistent with acute bronchopneumonia. There is the suggestion of cavitation within some pulmonary nodules, although this is somewhat difficult to distinguish from bronchiectatic changes (series 5, image 62, in the right lower lobe). No significant interlobular septal  thickening to suggest pulmonary edema. No pleural effusion. Upper Abdomen: Cholecystectomy.  Small hiatal hernia. Musculoskeletal: Mild reverse S curvature of the thoracic spine and discogenic degenerative changes. No acute osseous abnormality is evident. IMPRESSION: 1. Ground-glass opacities in clustered nodules in bronchovascular distribution most compatible with acute bronchopneumonia. There is a suggestion of cavitation within the larger nodules, although this is somewhat difficult to distinguish from bronchiectatic changes. Findings suggest an atypical infection including mycobacterial or fungal pneumonia. 2. Mediastinal lymphadenopathy, probably reactive. Electronically Signed   By: Kristine Garbe M.D.   On: 06/02/2016 18:40   Scheduled Meds: . amiodarone  200 mg Oral Daily  . apixaban  5 mg Oral BID  . bacitracin-polymyxin b   Right Eye QID  . diltiazem  120 mg Oral Daily  . famotidine  20 mg Oral BID  . feeding supplement (ENSURE  ENLIVE)  237 mL Oral BID BM  . guaiFENesin  1,200 mg Oral BID  . ipratropium  0.5 mg Nebulization QID  . levalbuterol  0.63 mg Nebulization QID  . mouth rinse  15 mL Mouth Rinse BID  . methylPREDNISolone (SOLU-MEDROL) injection  60 mg Intravenous Q12H  . mometasone-formoterol  2 puff Inhalation BID  . montelukast  10 mg Oral Daily  . multivitamin with minerals  1 tablet Oral Daily  . pantoprazole  40 mg Oral Daily  . sodium chloride HYPERTONIC  4 mL Nebulization Daily   Continuous Infusions: . sodium chloride Stopped (06/03/16 1410)  . azithromycin Stopped (06/04/16 1144)  . cefTRIAXone (ROCEPHIN)  IV Stopped (06/04/16 1051)    LOS: 2 days   Kerney Elbe, DO [facility] Pager [phone]  If 7PM-7AM, please contact night-coverage [email or web address] Password [email or web address] 06/04/2016, 2:54 PM

## 2016-06-04 NOTE — NC FL2 (Signed)
Crystal Kerr     IDENTIFICATION  Patient Name: Crystal Kerr Birthdate: [birth year] Sex: female Admission Date (Current Location): 06/02/2016  Crystal Kerr:  Crystal Kerr:  Crystal Kerr,  Crystal Kerr, Crystal Kerr      Provider Kerr: [id number]  Attending Physician Name and Kerr:  Kerney Elbe, DO  Relative Name and Phone Kerr:       Current Level of Care: Kerr Recommended Level of Care: Crystal Kerr Prior Approval Kerr:    Date Approved/Denied:   PASRR Kerr: [id number] [id number]  Discharge Plan: SNF    Current Diagnoses: Patient Active Problem List   Diagnosis Date Noted  . Atypical pneumonia 06/02/2016  . AF (paroxysmal atrial fibrillation) ([place]) 06/02/2016  . Bilateral leg edema 12/19/2013  . 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 Benign paroxysmal positional vertigo 09/13/2013  . Upper airway cough syndrome 07/10/2013  . Allergic rhinitis 07/05/2013  . Persistent atrial fibrillation ([place]) 03/30/2013  . Shortness of breath 02/06/2013  . Acute on chronic diastolic congestive heart failure ([place]) 02/04/2013  . Arthritis of spine ([place] Kerr) 07/01/2012  . Intrinsic asthma 07/01/2012  . Dyspnea 04/12/2012  . PVC's (premature ventricular contractions) 04/24/2011  . Murmur, cardiac 04/24/2011    Orientation RESPIRATION BLADDER Height & Weight     Self, Time, Situation, Place  Normal Incontinent Weight: 164 lb 0.4 oz (74.4 kg) Height:  5\' 3"  (160 cm)  BEHAVIORAL SYMPTOMS/MOOD NEUROLOGICAL BOWEL NUTRITION STATUS  Other (Comment) (no behaviors)   Continent Diet  AMBULATORY STATUS COMMUNICATION OF NEEDS Skin   Extensive Assist Verbally Normal                       Personal Care Assistance Level of Assistance  Bathing, Feeding, Dressing Bathing Assistance: Maximum  assistance Feeding assistance: Maximum assistance Dressing Assistance: Maximum assistance     Functional Limitations Info  Sight, Hearing, Speech Sight Info: Adequate Hearing Info: Impaired Speech Info: Adequate    SPECIAL CARE FACTORS FREQUENCY  PT (By licensed PT), OT (By licensed OT)     PT Frequency: 5x wk OT Frequency: 5x wk            Contractures Contractures Info: Not present    Additional Factors Info  Isolation Precautions Code Status Info: Full Code       Isolation Precautions Info: Droplet precaution     Current Medications (06/04/2016):  This is the current Kerr active medication list Current Facility-Administered Medications  Medication Dose Route Frequency Provider Last Rate Last Dose  . 0.9 %  sodium chloride infusion   Intravenous Continuous Raiford Noble [place], DO   Stopped at 06/03/16 1410  . acetaminophen (TYLENOL) tablet 650 mg  650 mg Oral Q6H PRN Gardiner Barefoot, NP   650 mg at 06/03/16 2259  . amiodarone (PACERONE) tablet 200 mg  200 mg Oral Daily Reubin Milan, MD   200 mg at 06/04/16 1025  . apixaban (ELIQUIS) tablet 5 mg  5 mg Oral BID Reubin Milan, MD   5 mg at 06/04/16 1026  . azithromycin (ZITHROMAX) 500 mg in dextrose 5 % 250 mL IVPB  500 mg Intravenous Q24H Reubin Milan, MD 250 mL/hr at 06/04/16 1044 500 mg at 06/04/16 1044  . bacitracin-polymyxin b (POLYSPORIN) ophthalmic ointment   Right Eye QID Reubin Milan, MD      .  cefTRIAXone (ROCEPHIN) 1 g in dextrose 5 % 50 mL IVPB  1 g Intravenous Q24H Reubin Milan, MD 100 mL/hr at 06/04/16 1021 1 g at 06/04/16 1021  . diltiazem (CARDIZEM CD) 24 hr capsule 120 mg  120 mg Oral Daily Reubin Milan, MD   120 mg at 06/04/16 1025  . docusate sodium (COLACE) capsule 200 mg  200 mg Oral Daily PRN Reubin Milan, MD      . famotidine (PEPCID) tablet 20 mg  20 mg Oral BID Reubin Milan, MD   20 mg at 06/04/16 1025  . feeding supplement (ENSURE  ENLIVE) (ENSURE ENLIVE) liquid 237 mL  237 mL Oral BID BM Reubin Milan, MD   237 mL at 06/04/16 1026  . guaiFENesin (MUCINEX) 12 hr tablet 1,200 mg  1,200 mg Oral BID Raiford Noble [place], DO   1,200 mg at 06/04/16 1025  . ipratropium (ATROVENT) nebulizer solution 0.5 mg  0.5 mg Nebulization QID Reubin Milan, MD   0.5 mg at 06/04/16 0751  . levalbuterol (XOPENEX) nebulizer solution 0.63 mg  0.63 mg Nebulization Q6H PRN Reubin Milan, MD      . levalbuterol [facility] Kerr) nebulizer solution 0.63 mg  0.63 mg Nebulization QID Reubin Milan, MD   0.63 mg at 06/04/16 1610  . MEDLINE mouth rinse  15 mL Mouth Rinse BID Reubin Milan, MD   15 mL at 06/04/16 1033  . methylPREDNISolone sodium succinate (SOLU-MEDROL) 125 mg/2 mL injection 60 mg  60 mg Intravenous Q12H Sheikh, Omair Latif, DO   60 mg at 06/04/16 1021  . mometasone-formoterol (DULERA) 200-5 MCG/ACT inhaler 2 puff  2 puff Inhalation BID Reubin Milan, MD   2 puff at 06/04/16 [phone]  . montelukast (SINGULAIR) tablet 10 mg  10 mg Oral Daily Reubin Milan, MD   10 mg at 06/04/16 1026  . multivitamin with minerals tablet 1 tablet  1 tablet Oral Daily Reubin Milan, MD   1 tablet at 06/04/16 1025  . pantoprazole (PROTONIX) EC tablet 40 mg  40 mg Oral Daily Reubin Milan, MD   40 mg at 06/04/16 1026  . sodium chloride HYPERTONIC 3 % nebulizer solution 4 mL  4 mL Nebulization Daily Raiford Noble [place], DO         Discharge Medications: Please see discharge summary for a list of discharge medications.  Relevant Imaging Results:  Relevant Lab Results:   Additional Information SS # [id number]  Crystal Kerr, Crystal An, LCSW

## 2016-06-04 NOTE — Clinical Social Work Note (Signed)
Clinical Social Work Assessment  Patient Details  Name: Crystal Kerr MRN: [id number] Date of Birth: 1924/08/13  Date of referral:  06/04/16               Reason for consult:  Discharge Planning                Permission sought to share information with:  Chartered certified accountant granted to share information::  Yes, Verbal Permission Granted  Name::        Agency::     Relationship::     Contact Information:     Housing/Transportation Living arrangements for the past 2 months:  Single Family Home Source of Information:  Patient, Adult Children Patient Interpreter Needed:  None Criminal Activity/Legal Involvement Pertinent to Current Situation/Hospitalization:  No - Comment as needed Significant Relationships:  Adult Children Lives with:  Adult Children Do you feel safe going back to the place where you live?   (SNF recommended) Need for family participation in patient care:  Yes (Comment)  Care giving concerns: Pt requires more assistance than is available at home following hospital d/c.   Social Worker assessment / plan:  Pt hospitalized from home on 06/02/16 with Atypical pneumonia. PT has recommended SNF at d/c. CSW met with pt / daughter, [name] [phone], to assist with d/c planning. Pt / daughter are in agreement with plan for ST Rehab. SNF search has been initiated and bed offers provided. Pt's daughter will contact CSW with SNF choice. CSW will continue to follow to assist with d/c planning needs.  Employment status:  Retired Forensic scientist:  Medicare PT Recommendations:  [place] / Referral to community resources:  [place]  Patient/Family's Response to care:  Pt / daughter feel SNF placement is needed.  Patient/Family's Understanding of and Emotional Response to Diagnosis, Current Treatment, and Prognosis: Pt / daughter are aware of pt's medical status. Pt reports , " I feel awful. " Pt is  looking forward to feeling better, having ST Rehab and returning home with daughter. Pt / daughter appreciate CSW's assistance with d/c planning.  Emotional Assessment Appearance:  Appears stated age Attitude/Demeanor/Rapport:  Other (cooperate) Affect (typically observed):  Appropriate, Calm Orientation:  Oriented to Self, Oriented to Place, Oriented to  Time, Oriented to Situation Alcohol / Substance use:  Not Applicable Psych involvement (Current and /or in the community):  No (Comment)  Discharge Needs  Concerns to be addressed:  Discharge Planning Concerns Readmission within the last 30 days:  No Current discharge risk:  None Barriers to Discharge:  No Barriers Identified   Luretha Rued, [place] 06/04/2016, 3:04 PM

## 2016-06-04 NOTE — Clinical Social Work Placement (Signed)
   CLINICAL SOCIAL WORK PLACEMENT  NOTE  Date:  06/04/2016  Patient Details  Name: Crystal Kerr MRN: [id number] Date of Birth: 05-Feb-1924  Clinical Social Work is seeking post-discharge placement for this patient at the [place] level of care (*CSW will initial, date and re-position this form in  chart as items are completed):  Yes   Patient/family provided with [place] Work Department's list of facilities offering this level of care within the geographic area requested by the patient (or if unable, by the patient's family).  Yes   Patient/family informed of their freedom to choose among providers that offer the needed level of care, that participate in Medicare, Medicaid or managed care program needed by the patient, have an available bed and are willing to accept the patient.  Yes   Patient/family informed of [facility]'s ownership interest in [facility] and [facility], as well as of the fact that they are under no obligation to receive care at these facilities.  PASRR submitted to EDS on 06/04/16     PASRR number received on 06/04/16     Existing PASRR number confirmed on       FL2 transmitted to all facilities in geographic area requested by pt/family on 06/04/16     FL2 transmitted to all facilities within larger geographic area on       Patient informed that his/her managed care company has contracts with or will negotiate with certain facilities, including the following:        Yes   Patient/family informed of bed offers received.  Patient chooses bed at       Physician recommends and patient chooses bed at      Patient to be transferred to   on  .  Patient to be transferred to facility by       Patient family notified on   of transfer.  Name of family member notified:        PHYSICIAN       Additional Comment:    _______________________________________________ Luretha Rued, [place] 06/04/2016, 3:12 PM

## 2016-06-04 NOTE — Evaluation (Signed)
Physical Therapy Evaluation Patient Details Name: Crystal Kerr MRN: [id number] DOB: [birth year] Today's Date: 06/04/2016   History of Present Illness  83 y.o. female admitted with atypical pna, weakness. History of osteoarthritis, asthma, paroxysmal atrial fibrillation, CHF.    Clinical Impression  On eval, pt required Min assist for mobility. She performed a stand pivot and walked ~4 feet with the use of a RW. Pt presents with general weakness, decreased activity tolerance, and impaired gait and balance. Recommend ST rehab at SNF prior to returning home. Will follow. Noted some blood in bsc after pt attempted to [facility] RN aware.     Follow Up Recommendations SNF    Equipment Recommendations   (TBD at next venue)    Recommendations for Other Services OT consult     Precautions / Restrictions Precautions Precautions: Fall Precaution Comments: droplet Restrictions Weight Bearing Restrictions: No      Mobility  Bed Mobility Overal bed mobility: Needs Assistance Bed Mobility: Supine to Sit     Supine to sit: HOB elevated;Min assist     General bed mobility comments: Assist to scoot to EOB. Increased time. VCs safety, technique. Pt relied on bedrail.  Transfers Overall transfer level: Needs assistance Equipment used: Rolling walker (2 wheeled) Transfers: Sit to/from [facility] Sit to Stand: Min assist Stand pivot transfers: Min assist       General transfer comment: Assist to rise, stabilize, control descent. VCs safety, technique, hand placement. Stand pivot, bed to bsc, with RW.   Ambulation/Gait   Ambulation Distance (Feet): 4 Feet Assistive device: Rolling walker (2 wheeled) Gait Pattern/deviations: Step-through pattern;Decreased stride length     General Gait Details: Assist to stabilize pt and maneuver safely with RW.   Stairs            Wheelchair Mobility    Modified Rankin (Stroke Patients Only)        Balance Overall balance assessment: Needs assistance           Standing balance-Leahy Scale: Poor                               Pertinent Vitals/Pain Pain Assessment: No/denies pain    Home Living Family/patient expects to be discharged to:: Skilled nursing facility                      Prior Function Level of Independence: Independent               Hand Dominance        Extremity/Trunk Assessment   Upper Extremity Assessment Upper Extremity Assessment: Defer to OT evaluation    Lower Extremity Assessment Lower Extremity Assessment: Generalized weakness    Cervical / Trunk Assessment Cervical / Trunk Assessment: Normal  Communication   Communication: No difficulties  Cognition Arousal/Alertness: Awake/alert Behavior During Therapy: WFL for tasks assessed/performed Overall Cognitive Status: Within Functional Limits for tasks assessed                                        General Comments      Exercises     Assessment/Plan    PT Assessment Patient needs continued PT services  PT Problem List Decreased strength;Decreased mobility;Decreased balance;Decreased activity tolerance;Decreased knowledge of use of DME       PT Treatment Interventions DME instruction;Gait training;Therapeutic activities;Therapeutic  exercise;Patient/family education;Functional mobility training;Balance training    PT Goals (Current goals can be found in the Care Plan section)  Acute Rehab PT Goals Patient Stated Goal: get stonger to go home  PT Goal Formulation: With patient Time For Goal Achievement: 06/18/16 Potential to Achieve Goals: Good    Frequency Min 3X/week   Barriers to discharge        Co-evaluation               AM-PAC PT "6 Clicks" Daily Activity  Outcome Measure Difficulty turning over in bed (including adjusting bedclothes, sheets and blankets)?: A Little Difficulty moving from lying on back to sitting  on the side of the bed? : A Little Difficulty sitting down on and standing up from a chair with arms (e.g., wheelchair, bedside commode, etc,.)?: A Little Help needed moving to and from a bed to chair (including a wheelchair)?: A Little Help needed walking in hospital room?: A Lot Help needed climbing 3-5 steps with a railing? : A Lot 6 Click Score: 16    End of Session Equipment Utilized During Treatment: Gait belt Activity Tolerance: Patient limited by fatigue Patient left: in chair;with call bell/phone within reach   PT Visit Diagnosis: Muscle weakness (generalized) (M62.81);Difficulty in walking, not elsewhere classified (R26.2)    Time: 1610-9604 PT Time Calculation (min) (ACUTE ONLY): 16 min   Charges:   PT Evaluation $PT Eval Low Complexity: 1 Procedure     PT G Codes:          Weston Anna, MPT Pager: [phone]

## 2016-06-05 ENCOUNTER — Encounter: Payer: Self-pay | Admitting: Geriatric Medicine

## 2016-06-05 ENCOUNTER — Inpatient Hospital Stay (HOSPITAL_COMMUNITY): Payer: Medicare Other

## 2016-06-05 DIAGNOSIS — R0600 Dyspnea, unspecified: Secondary | ICD-10-CM | POA: Diagnosis not present

## 2016-06-05 DIAGNOSIS — R0602 Shortness of breath: Secondary | ICD-10-CM | POA: Diagnosis not present

## 2016-06-05 DIAGNOSIS — K5900 Constipation, unspecified: Secondary | ICD-10-CM | POA: Diagnosis not present

## 2016-06-05 DIAGNOSIS — D72829 Elevated white blood cell count, unspecified: Secondary | ICD-10-CM

## 2016-06-05 DIAGNOSIS — R2681 Unsteadiness on feet: Secondary | ICD-10-CM | POA: Diagnosis not present

## 2016-06-05 DIAGNOSIS — J45909 Unspecified asthma, uncomplicated: Secondary | ICD-10-CM | POA: Diagnosis not present

## 2016-06-05 DIAGNOSIS — E639 Nutritional deficiency, unspecified: Secondary | ICD-10-CM | POA: Diagnosis not present

## 2016-06-05 DIAGNOSIS — I509 Heart failure, unspecified: Secondary | ICD-10-CM | POA: Diagnosis not present

## 2016-06-05 DIAGNOSIS — Z8551 Personal history of malignant neoplasm of bladder: Secondary | ICD-10-CM | POA: Diagnosis not present

## 2016-06-05 DIAGNOSIS — H1031 Unspecified acute conjunctivitis, right eye: Secondary | ICD-10-CM | POA: Diagnosis not present

## 2016-06-05 DIAGNOSIS — I10 Essential (primary) hypertension: Secondary | ICD-10-CM | POA: Diagnosis not present

## 2016-06-05 DIAGNOSIS — J189 Pneumonia, unspecified organism: Secondary | ICD-10-CM | POA: Diagnosis not present

## 2016-06-05 DIAGNOSIS — M6281 Muscle weakness (generalized): Secondary | ICD-10-CM | POA: Diagnosis not present

## 2016-06-05 DIAGNOSIS — R2689 Other abnormalities of gait and mobility: Secondary | ICD-10-CM | POA: Diagnosis not present

## 2016-06-05 DIAGNOSIS — J45998 Other asthma: Secondary | ICD-10-CM | POA: Diagnosis not present

## 2016-06-05 DIAGNOSIS — R5381 Other malaise: Secondary | ICD-10-CM | POA: Diagnosis not present

## 2016-06-05 DIAGNOSIS — K219 Gastro-esophageal reflux disease without esophagitis: Secondary | ICD-10-CM | POA: Diagnosis not present

## 2016-06-05 DIAGNOSIS — I480 Paroxysmal atrial fibrillation: Secondary | ICD-10-CM | POA: Diagnosis not present

## 2016-06-05 DIAGNOSIS — I4891 Unspecified atrial fibrillation: Secondary | ICD-10-CM | POA: Diagnosis not present

## 2016-06-05 LAB — COMPREHENSIVE METABOLIC PANEL
ALT: 55 U/L — ABNORMAL HIGH (ref 14–54)
ANION GAP: 10 (ref 5–15)
AST: 67 U/L — ABNORMAL HIGH (ref 15–41)
Albumin: 2.6 g/dL — ABNORMAL LOW (ref 3.5–5.0)
Alkaline Phosphatase: 100 U/L (ref 38–126)
BILIRUBIN TOTAL: 0.2 mg/dL — AB (ref 0.3–1.2)
BUN: 19 mg/dL (ref 6–20)
CO2: 26 mmol/L (ref 22–32)
Calcium: 8.8 mg/dL — ABNORMAL LOW (ref 8.9–10.3)
Chloride: 100 mmol/L — ABNORMAL LOW (ref 101–111)
Creatinine, Ser: 0.77 mg/dL (ref 0.44–1.00)
Glucose, Bld: 152 mg/dL — ABNORMAL HIGH (ref 65–99)
POTASSIUM: 4 mmol/L (ref 3.5–5.1)
Sodium: 136 mmol/L (ref 135–145)
TOTAL PROTEIN: 6.4 g/dL — AB (ref 6.5–8.1)

## 2016-06-05 LAB — CBC WITH DIFFERENTIAL/PLATELET
Basophils Absolute: 0 10*3/uL (ref 0.0–0.1)
Basophils Relative: 0 %
EOS PCT: 0 %
Eosinophils Absolute: 0 10*3/uL (ref 0.0–0.7)
HEMATOCRIT: 32.7 % — AB (ref 36.0–46.0)
Hemoglobin: 10.7 g/dL — ABNORMAL LOW (ref 12.0–15.0)
LYMPHS ABS: 1.1 10*3/uL (ref 0.7–4.0)
Lymphocytes Relative: 8 %
MCH: 26.8 pg (ref 26.0–34.0)
MCHC: 32.7 g/dL (ref 30.0–36.0)
MCV: 82 fL (ref 78.0–100.0)
MONO ABS: 0.7 10*3/uL (ref 0.1–1.0)
MONOS PCT: 5 %
NEUTROS ABS: 12.1 10*3/uL — AB (ref 1.7–7.7)
Neutrophils Relative %: 87 %
Platelets: 351 10*3/uL (ref 150–400)
RBC: 3.99 MIL/uL (ref 3.87–5.11)
RDW: 15.2 % (ref 11.5–15.5)
WBC: 13.9 10*3/uL — ABNORMAL HIGH (ref 4.0–10.5)

## 2016-06-05 LAB — PHOSPHORUS: PHOSPHORUS: 2.2 mg/dL — AB (ref 2.5–4.6)

## 2016-06-05 LAB — MAGNESIUM: MAGNESIUM: 1.9 mg/dL (ref 1.7–2.4)

## 2016-06-05 MED ORDER — PREDNISONE 10 MG (21) PO TBPK: ORAL_TABLET | ORAL | 0 refills | Status: DC

## 2016-06-05 MED ORDER — GUAIFENESIN ER 600 MG PO TB12: 1200.0000 mg | ORAL_TABLET | Freq: Two times a day (BID) | ORAL | 0 refills | Status: DC

## 2016-06-05 MED ORDER — SODIUM PHOSPHATES 45 MMOLE/15ML IV SOLN
30.0000 mmol | Freq: Once | INTRAVENOUS | Status: AC
  Administered 2016-06-05: 30 mmol via INTRAVENOUS
  Filled 2016-06-05: qty 10

## 2016-06-05 MED ORDER — AZITHROMYCIN 500 MG PO TABS: 500.0000 mg | ORAL_TABLET | Freq: Every day | ORAL | 0 refills | Status: AC

## 2016-06-05 MED ORDER — BACITRACIN-POLYMYXIN B 500-10000 UNIT/GM OP OINT: TOPICAL_OINTMENT | Freq: Four times a day (QID) | OPHTHALMIC | 0 refills | Status: DC

## 2016-06-05 MED ORDER — TRAMADOL HCL 50 MG PO TABS: ORAL_TABLET | ORAL | 1 refills | Status: DC

## 2016-06-05 MED ORDER — ACETAMINOPHEN 325 MG PO TABS: 650.0000 mg | ORAL_TABLET | Freq: Four times a day (QID) | ORAL | 0 refills | Status: AC | PRN

## 2016-06-05 MED ORDER — ENSURE ENLIVE PO LIQD: 237.0000 mL | Freq: Two times a day (BID) | ORAL | 12 refills | Status: DC

## 2016-06-05 MED ORDER — LEVALBUTEROL HCL 0.63 MG/3ML IN NEBU: 0.6300 mg | INHALATION_SOLUTION | Freq: Four times a day (QID) | RESPIRATORY_TRACT | 12 refills | Status: DC | PRN

## 2016-06-05 MED ORDER — CEFPODOXIME PROXETIL 200 MG PO TABS: 200.0000 mg | ORAL_TABLET | Freq: Two times a day (BID) | ORAL | 0 refills | Status: DC

## 2016-06-05 NOTE — Progress Notes (Signed)
Physical Therapy Treatment Patient Details Name: BRALYNN VELADOR MRN: [id number] DOB: 02/15/1924 Today's Date: 06/05/2016    History of Present Illness 83 y.o. female admitted with atypical pna, weakness. History of osteoarthritis, asthma, paroxysmal atrial fibrillation, CHF.    PT Comments    Progressing slowly with mobility. Pt remains weak and she is still experiencing dizziness with OOB activity. Possible d/c to SNF today.    Follow Up Recommendations  SNF     Equipment Recommendations       Recommendations for Other Services       Precautions / Restrictions Precautions Precautions: Fall Restrictions Weight Bearing Restrictions: No    Mobility  Bed Mobility Overal bed mobility: Needs Assistance Bed Mobility: Supine to Sit     Supine to sit: Min guard;HOB elevated     General bed mobility comments: close guard for safety. Pt c/o dizziness with initial sitting. Used bedrail  Transfers Overall transfer level: Needs assistance Equipment used: Rolling walker (2 wheeled) Transfers: Sit to/from [facility] Sit to Stand: Min assist Stand pivot transfers: Min assist       General transfer comment: Assist to rise, stabilize, control descent. VCs safety, technique, hand placement. Stand pivot, bed to recliner, with RW  Ambulation/Gait             General Gait Details: NT- still weak and dizzy on today   Stairs            Wheelchair Mobility    Modified Rankin (Stroke Patients Only)       Balance Overall balance assessment: Needs assistance           Standing balance-Leahy Scale: Poor Standing balance comment: at risk for falls                            Cognition Arousal/Alertness: Awake/alert Behavior During Therapy: WFL for tasks assessed/performed Overall Cognitive Status: Within Functional Limits for tasks assessed                                        Exercises       General Comments        Pertinent Vitals/Pain Pain Assessment: No/denies pain    Home Living                      Prior Function            PT Goals (current goals can now be found in the care plan section) Progress towards PT goals: Progressing toward goals (slowly)    Frequency    Min 3X/week      PT Plan Current plan remains appropriate    Co-evaluation              AM-PAC PT "6 Clicks" Daily Activity  Outcome Measure  Difficulty turning over in bed (including adjusting bedclothes, sheets and blankets)?: A Little Difficulty moving from lying on back to sitting on the side of the bed? : A Little Difficulty sitting down on and standing up from a chair with arms (e.g., wheelchair, bedside commode, etc,.)?: A Little Help needed moving to and from a bed to chair (including a wheelchair)?: A Little Help needed walking in hospital room?: A Lot Help needed climbing 3-5 steps with a railing? : A Lot 6 Click Score: 16    End of Session  Activity Tolerance: Patient limited by fatigue Patient left: in chair;with call bell/phone within reach   PT Visit Diagnosis: Muscle weakness (generalized) (M62.81);Difficulty in walking, not elsewhere classified (R26.2)     Time: 0998-3382 PT Time Calculation (min) (ACUTE ONLY): 11 min  Charges:  $Therapeutic Activity: 8-22 mins                    G Codes:         Weston Anna, MPT Pager: [phone]

## 2016-06-05 NOTE — Discharge Summary (Addendum)
Physician Discharge Summary  Crystal Kerr FTD:[id number] DOB: July 23, 1924 DOA: 06/02/2016  PCP: [name], MD  Admit date: 06/02/2016 Discharge date: 06/05/2016  Admitted From: Home Disposition:  SNF  Recommendations for Outpatient Follow-up:  1. Follow up with PCP in 1-2 weeks 2. Follow up with Pulmonology Dr. Melvyn Novas at D/C to further evaluate Atypical Pneumonia and for ? Amiodarone Toxicity; Has an appointment with NP Johnella Moloney on June 17, 2016 at 10:00 am. 3. Follow up with [facility]rology for follow up for Hematuria 4. Follow up with Cardiology Dr. Rogue Jury ad D/C for Atrial Fibrillation and ?Amiodarone Toxicity  5. Please obtain CMP/CBC in one week 6. Repeat CXR in outpatient setting  7. Please follow up on the following pending results: Amiodarone Level  Home Health: No Equipment/Devices: TBD at [facility]   Discharge Condition: Stable CODE STATUS: FULL CODE Diet recommendation: Heart Healthy   Brief/Interim Summary: Crystal Salser Sappingtonis a 83 y.o.femalewith medical history significant of osteoarthritis, asthma, paroxysmal atrial fibrillation on Eliquis, CHF, PVCs and other comorbid's who is coming to the Emergency Department with complaints of brown sputum producing cough, dyspnea, wheezing, decreased appetite, increased somnolence, fatigue and malaise. She denied diarrhea at this time, but stated that about a week or so ago she had a "stomach flu" with some loose stools. She denies abdominal pain, nausea, emesis, melena or hematochezia. She denies dysuria or hematuria. Chest CT was done and showed groundglass opacities and cluster nodules in bronchovascular distribution most compatible with acute bronchopneumonia and compatible with Atypical Pneumonia. Patient was found to have positive Enterovirus/Rhinovius on Respiratory Viral Panel. She is much improved and states she feels almost back to her baseline. At this time patient has been deemed medically stable and  will D/C to SNF to continue Strength training. She will need to follow up with [facility] Urology, Pulmonology, Cardiology and PCP at D/C.  Discharge Diagnoses:  Principal Problem:   Atypical pneumonia Active Problems:   Dyspnea   Intrinsic asthma   Shortness of breath   AF (paroxysmal atrial fibrillation) (HCC)   Atypical Pneumonia in the setting of Rhinovirus/Enterovirus  -CT of Chest showed Ground-glass opacities in clustered nodules in bronchovasculardistribution most compatible with acute bronchopneumonia. There is a suggestion of cavitation within the larger nodules, although this is somewhat difficult to distinguish from bronchiectatic changes. Findings suggest an atypical infection including mycobacterial or fungal pneumonia. Mediastinal lymphadenopathy, probably reactive. -Admitted to telemetry/inpatient. -Continue supplemental oxygen as needed. We'll continue bronchodilators. -Changed Azithromycin 500 mg IVPB and Ceftriaxone 1 g IV PB every 24 hours to po Azithromycinand po Vantin  -Check a sputum Gram stain culture and sensitivity (pending). -Check a strep pneumonia and legionella urinary antigens (pending). -Checked Respiratory Viral Panel and was positive for Rhinovirus/Enterovirus  -C/w Xopenex prn -C/w Mucinex 1200 mg po BID  -WBC went from 15.0 -> 12.1 -> 9.1 -> 13.1 (Likely Steroid Demargination) -C/w Flutter Valve and Incentive Spirometer -Added IV Solumedrol 60 mg q12h as patient was wheezing and will switch to Prednisone Taper tomorrow -Added Hypertonic Saline Nebs to Therapy yesterday -Repeat CXR this AM showed  Slight improvement in the bilateral pneumonia since 3 days ago, though moderate ground-glass airspace opacities persist, particularly in the upper lobes and right lower lobe. No new abnormalities. -? Amiodarone Toxicity with Ground Glass opacities -Will need to follow up with Cardiology Dr. Rogue Jury and Pulmonology Dr. Melvyn Novas in the outpatient setting -Repeat CXR  as an outpatient  Intrinsic Asthma -Supplemental oxygen as needed. -Continue bronchodilators with Symbicort  2 puff RT BID and Montelukast 10 mg po Daily -C/w Nebs as above -Had mild wheezing so continued IV Solumedrol today and transitioned to po Prednisone Taper iN AM  AF (paroxysmal atrial fibrillation) (HCC) -CHA2DS2-VASc Scoreof at least 4. -Continue Amiodarone for rate control for now; Will need to discuss with Cardiology about possible Amiodarone Toxicity as an outpatient -Check Amiodarone Level and ordered and pending -C/w Diltiazem 120 mg po Daily  -Continue Anticoagulation with Apixaban 5 mg po BID  Hypophosphatemia -Patient's Phos was 2.2 this AM -Replete with IV Sodium Phos 30 mmol -Repeat Phos Level at SNF  Mildly Abnormal LFTs/Transaminitis  -AST went from 59 -> 67 and ALT went from 42 -> 55 -Unclear Etiology; Decreased po prn Tylenol Dose -Continue to Monitor and Repeat CMP at SNF to follow Trend  GERD -C/w Famotidine 20 mg po BID and with Pantoprazole 40 mg po Daily  Right Eye Conjunctivitis -C/w Polysporin in Right Eye 4 times a day  Hx of Bladder Cancer  -Had some blood tinged urine this Afternoon; Continue to To Monitor  -Follow up as an outpatient with [facility] as an outpatient  -Recent office visit for Hematuria on 05/18/16 -Will attempt to get Office Visit notes   Hx of Diastolic CHF -Not Decompensated -Continue to Monitor Volume Status -Strict I's and O's and Daily Weights -I's and O's are not accurate since patient is incontinent and Patient is + 2.243 -Follow up with Cardiology as an outpatient  Hypertension -Patient's BP was elevated yesterday -C/w Cardizem 120 mg po Daily -When needed will add IV Hydralazine 5 mg q6hprn for SBP > 180 or DBP > 100  Leukocytosis -Patient's WBC increased from 9.1 -> 13.9 -Likely from Steroid Demargination as patient has been on IV Steroids with Solumedrol -Repeat CBC in AM  Discharge  Instructions  Allergies as of 06/05/2016   No Known Allergies     Medication List    TAKE these medications   acetaminophen 325 MG tablet Commonly known as:  TYLENOL Take 2 tablets (650 mg total) by mouth every 6 (six) hours as needed for mild pain or headache. What changed:  medication strength  how much to take  when to take this  reasons to take this   albuterol 108 (90 Base) MCG/ACT inhaler Commonly known as:  PROAIR HFA Inhale 2 puffs into the lungs every 4 (four) hours as needed for wheezing or shortness of breath.   amiodarone 200 MG tablet Commonly known as:  PACERONE TAKE 1 TABLET BY MOUTH DAILY   apixaban 5 MG Tabs tablet Commonly known as:  ELIQUIS Take 1 tablet (5 mg total) by mouth 2 (two) times daily.   azithromycin 500 MG tablet Commonly known as:  ZITHROMAX Take 1 tablet (500 mg total) by mouth daily. Take 1 tablet daily for 3 days. Start taking on:  06/06/2016   bacitracin-polymyxin b ophthalmic ointment Commonly known as:  POLYSPORIN Place into the right eye 4 (four) times daily. apply to eye every 12 hours while awake   budesonide-formoterol 160-4.5 MCG/ACT inhaler Commonly known as:  SYMBICORT INHALE 2 PUFFS FIRST THING EVERY MORNING AND 2 PUFFS EVERY 12 HOURS   cefpodoxime 200 MG tablet Commonly known as:  VANTIN Take 1 tablet (200 mg total) by mouth 2 (two) times daily.   diltiazem 120 MG 24 hr capsule Commonly known as:  TIAZAC TAKE 1 CAPSULE BY MOUTH DAILY   docusate sodium 100 MG capsule Commonly known as:  COLACE Take 200 mg by  mouth daily as needed for mild constipation.   famotidine 20 MG tablet Commonly known as:  PEPCID TAKE 1 TABLET BY MOUTH EVERY NIGHT AT BEDTIME   feeding supplement (ENSURE ENLIVE) Liqd Take 237 mLs by mouth 2 (two) times daily between meals.   guaiFENesin 600 MG 12 hr tablet Commonly known as:  MUCINEX Take 2 tablets (1,200 mg total) by mouth 2 (two) times daily.   levalbuterol 0.63 MG/3ML nebulizer  solution Commonly known as:  XOPENEX Take 3 mLs (0.63 mg total) by nebulization every 6 (six) hours as needed for wheezing or shortness of breath.   montelukast 10 MG tablet Commonly known as:  SINGULAIR TAKE 1 TABLET BY MOUTH EVERY DAY   multivitamin with minerals tablet Take 1 tablet by mouth daily.   ondansetron 4 MG tablet Commonly known as:  ZOFRAN Take 1 tablet (4 mg total) by mouth every 8 (eight) hours as needed for nausea or vomiting.   pantoprazole 40 MG tablet Commonly known as:  PROTONIX TAKE 1 TABLET BY MOUTH DAILY. TAKE 30-60 MINUTES BEFORE FIRST MEAL OF THE DAY   predniSONE 10 MG (21) Tbpk tablet Commonly known as:  STERAPRED UNI-PAK 21 TAB Take 6 Tablets Day 1, 5 Tablets Day 2, 4 Tablets Day 3, 3 Tablets Day 4, 2 Tablets Day 5, and 1 Tablet Day 6 and Stop on Day 7   traMADol 50 MG tablet Commonly known as:  ULTRAM Take 1/2 or 1 tablet by mouth every 8 hours as needed for hip pain   triamcinolone cream 0.1 % Commonly known as:  KENALOG Apply 1 application topically 2 (two) times daily. Apply to rash on side as needed      Follow-up Information    Copland, Gay Filler, MD. Call in 1 week(s).   Specialty:  Family Medicine Why:  Call to schedule an appointment when out of SNF Contact information: [address] [place] [address] [phone]        Donita Brooks, NP. Go to.   Specialty:  Pulmonary Disease Why:  Appointment scheduled for June 17, 2016 at 10:00 am Contact information: [place] [address] [phone]        Tanda Rockers, MD Follow up.   Specialty:  Pulmonary Disease Why:  Follow up as an outpatient Contact information: [address] [address] [phone]          No Known Allergies  Consultations:  None  Procedures/Studies: Dg Chest 2 View  Result Date: 06/05/2016 CLINICAL DATA:  83 year old presenting with intermittent shortness of breath over the past several  days associated with central chest pain. Recent cardioversion for atrial fibrillation. Former long-time smoker who quit approximately 30 years ago. Current history of asthma and CHF. Follow-up pneumonia, possibly mycobacterial or fungal, identified on CT 3 days ago. EXAM: CHEST  2 VIEW COMPARISON:  06/02/2016, 03/31/2016 and earlier, including CT chest 06/02/2016 and earlier. FINDINGS: AP semi-erect and lateral images were obtained. Slight improvement in aeration in both lungs since the examination 3 days ago, though moderate residual ground-glass airspace opacities persist, particularly in the upper lobes and in the right lower lobe. No new pulmonary parenchymal abnormalities. Cardiac silhouette upper normal in size to slightly enlarged for AP technique, unchanged. Thoracic aorta atherosclerotic, unchanged. Hilar and mediastinal contours otherwise unremarkable. Severe degenerative changes involving the right shoulder. Degenerative changes involving the thoracic spine. Osseous demineralization as noted previously. IMPRESSION: Slight improvement in the bilateral pneumonia since  3 days ago, though moderate ground-glass airspace opacities persist, particularly in the upper lobes and right lower lobe. No new abnormalities. Electronically Signed   By: Evangeline Dakin M.D.   On: 06/05/2016 10:50   Dg Chest 2 View  Result Date: 06/02/2016 CLINICAL DATA:  83 y/o  F; cough. EXAM: CHEST  2 VIEW COMPARISON:  04/08/2016 chest radiograph. FINDINGS: Stable normal cardiac silhouette. Aortic atherosclerosis with calcification. Diffuse hazy opacification of lungs and ill-defined opacities in mid and upper lung zones. No pleural effusion or pneumothorax. Bones are unremarkable. IMPRESSION: Diffuse hazy opacification of lungs and ill-defined opacities in mid and upper lung zones may represent developing pulmonary edema or infiltrates. Electronically Signed   By: Kristine Garbe M.D.   On: 06/02/2016 16:08   Ct Chest Wo  Contrast  Result Date: 06/02/2016 CLINICAL DATA:  83 y/o  F; productive cough. EXAM: CT CHEST WITHOUT CONTRAST TECHNIQUE: Multidetector CT imaging of the chest was performed following the standard protocol without IV contrast. COMPARISON:  06/02/2016 chest radiograph. FINDINGS: Cardiovascular: Normal heart size. No pericardial effusion. Normal caliber thoracic aorta. Mild coronary artery calcification. Mediastinum/Nodes: Mediastinal lymphadenopathy is likely reactive with lymph nodes measuring up to 16 x 12 mm in the AP window (series 2, image 41). The thoracic esophagus is unremarkable. Left lobe of thyroid nodule measuring 14 mm. Lungs/Pleura: Diffuse peribronchial thickening with ground-glass opacities and clustered nodules in a bronchovascular distribution. Findings are most consistent with acute bronchopneumonia. There is the suggestion of cavitation within some pulmonary nodules, although this is somewhat difficult to distinguish from bronchiectatic changes (series 5, image 62, in the right lower lobe). No significant interlobular septal thickening to suggest pulmonary edema. No pleural effusion. Upper Abdomen: Cholecystectomy.  Small hiatal hernia. Musculoskeletal: Mild reverse S curvature of the thoracic spine and discogenic degenerative changes. No acute osseous abnormality is evident. IMPRESSION: 1. Ground-glass opacities in clustered nodules in bronchovascular distribution most compatible with acute bronchopneumonia. There is a suggestion of cavitation within the larger nodules, although this is somewhat difficult to distinguish from bronchiectatic changes. Findings suggest an atypical infection including mycobacterial or fungal pneumonia. 2. Mediastinal lymphadenopathy, probably reactive. Electronically Signed   By: Kristine Garbe M.D.   On: 06/02/2016 18:40    Subjective: Seen and examined at bedside and stated she felt tremendously better and "back to being herself." States she is not  coughing up much more sputum. No nausea or vomiting and states Hematuria has resolved. No other complaints or concerns at this time.   Discharge Exam: Vitals:   06/05/16 0447 06/05/16 1055  BP: 118/81 (!) 177/74  Pulse: 66   Resp: 18   Temp: 98.8 F (37.1 C)    Vitals:   06/04/16 2009 06/04/16 2048 06/05/16 0447 06/05/16 1055  BP:  (!) 156/50 118/81 (!) 177/74  Pulse:  63 66   Resp:  20 18   Temp:  99.4 F (37.4 C) 98.8 F (37.1 C)   TempSrc:  Oral Oral   SpO2: 94% 94% 96%   Weight:      Height:       General: Pt is alert, awake, not in acute distress and pleasant to speak with  Cardiovascular: RRR, S1/S2 +, no rubs, no gallops Respiratory: Diminished bilaterally with wheezing and some rhonchi; Patient was not tachypenic or using any accessory muscles to breathe.  Abdominal: Soft, NT, ND, bowel sounds + Extremities: no edema, no cyanosis  The results of significant diagnostics from this hospitalization (including imaging, microbiology, ancillary and laboratory)  are listed below for reference.    Microbiology: Recent Results (from the past 240 hour(s))  Respiratory Panel by PCR     Status: Abnormal   Collection Time: 06/04/16 12:56 PM  Result Value Ref Range Status   Adenovirus NOT DETECTED NOT DETECTED Final   Coronavirus 229E NOT DETECTED NOT DETECTED Final   Coronavirus HKU1 NOT DETECTED NOT DETECTED Final   Coronavirus NL63 NOT DETECTED NOT DETECTED Final   Coronavirus OC43 NOT DETECTED NOT DETECTED Final   Metapneumovirus NOT DETECTED NOT DETECTED Final   Rhinovirus / Enterovirus DETECTED (A) NOT DETECTED Final   Influenza A NOT DETECTED NOT DETECTED Final   Influenza B NOT DETECTED NOT DETECTED Final   Parainfluenza Virus 1 NOT DETECTED NOT DETECTED Final   Parainfluenza Virus 2 NOT DETECTED NOT DETECTED Final   Parainfluenza Virus 3 NOT DETECTED NOT DETECTED Final   Parainfluenza Virus 4 NOT DETECTED NOT DETECTED Final   Respiratory Syncytial Virus NOT DETECTED  NOT DETECTED Final   Bordetella pertussis NOT DETECTED NOT DETECTED Final   Chlamydophila pneumoniae NOT DETECTED NOT DETECTED Final   Mycoplasma pneumoniae NOT DETECTED NOT DETECTED Final    Comment: Performed at [facility], [place] [address] [place], [place] [address]    Labs: BNP (last 3 results)  Recent Labs  06/02/16 1659  BNP 254.2*   Basic Metabolic Panel:  Recent Labs Lab 06/02/16 1659 06/03/16 0601 06/04/16 0525 06/05/16 0537  NA 133* 130* 133* 136  K 4.3 3.6 3.5 4.0  CL 96* 98* 101 100*  CO2 24 21* 24 26  GLUCOSE 77 80 83 152*  BUN 20 20 14 19   CREATININE 1.07* 1.13* 0.85 0.77  CALCIUM 9.1 8.4* 8.4* 8.8*  MG  --   --   --  1.9  PHOS  --   --   --  2.2*   Liver Function Tests:  Recent Labs Lab 06/02/16 1659 06/05/16 0537  AST 59* 67*  ALT 42 55*  ALKPHOS 115 100  BILITOT 0.9 0.2*  PROT 7.3 6.4*  ALBUMIN 3.2* 2.6*   No results for input(s): LIPASE, AMYLASE in the last 168 hours. No results for input(s): AMMONIA in the last 168 hours. CBC:  Recent Labs Lab 06/02/16 1659 06/03/16 0601 06/04/16 0525 06/05/16 0537  WBC 15.0* 12.1* 9.1 13.9*  NEUTROABS 12.4* 9.2* 6.2 12.1*  HGB 12.0 10.4* 10.8* 10.7*  HCT 36.4 31.5* 33.3* 32.7*  MCV 83.3 82.9 82.6 82.0  PLT 366 300 316 351   Cardiac Enzymes:  Recent Labs Lab 06/02/16 1659  TROPONINI <0.03   BNP: Invalid input(s): POCBNP CBG: No results for input(s): GLUCAP in the last 168 hours. D-Dimer No results for input(s): DDIMER in the last 72 hours. Hgb A1c No results for input(s): HGBA1C in the last 72 hours. Lipid Profile No results for input(s): CHOL, HDL, LDLCALC, TRIG, CHOLHDL, LDLDIRECT in the last 72 hours. Thyroid function studies No results for input(s): TSH, T4TOTAL, T3FREE, THYROIDAB in the last 72 hours.  Invalid input(s): FREET3 Anemia work up No results for input(s): VITAMINB12, FOLATE, FERRITIN, TIBC, IRON, RETICCTPCT in the last 72 hours. Urinalysis    Component  Value Date/Time   COLORURINE AMBER (A) 03/04/2013 1248   APPEARANCEUR CLOUDY (A) 03/04/2013 1248   LABSPEC 1.026 03/04/2013 1248   PHURINE 5.0 03/04/2013 1248   GLUCOSEU NEGATIVE 03/04/2013 1248   HGBUR NEGATIVE 03/04/2013 1248   BILIRUBINUR negative 03/26/2016 1628   BILIRUBINUR neg 01/02/2014 1734   KETONESUR negative  03/26/2016 1628   KETONESUR NEGATIVE 03/04/2013 1248   PROTEINUR =100 (A) 03/26/2016 1628   PROTEINUR neg 01/02/2014 1734   PROTEINUR NEGATIVE 03/04/2013 1248   UROBILINOGEN 0.2 03/26/2016 1628   UROBILINOGEN 0.2 03/04/2013 1248   NITRITE Negative 03/26/2016 1628   NITRITE neg 01/02/2014 1734   NITRITE NEGATIVE 03/04/2013 1248   LEUKOCYTESUR Negative 03/26/2016 1628   Sepsis Labs Invalid input(s): PROCALCITONIN,  WBC,  LACTICIDVEN Microbiology Recent Results (from the past 240 hour(s))  Respiratory Panel by PCR     Status: Abnormal   Collection Time: 06/04/16 12:56 PM  Result Value Ref Range Status   Adenovirus NOT DETECTED NOT DETECTED Final   Coronavirus 229E NOT DETECTED NOT DETECTED Final   Coronavirus HKU1 NOT DETECTED NOT DETECTED Final   Coronavirus NL63 NOT DETECTED NOT DETECTED Final   Coronavirus OC43 NOT DETECTED NOT DETECTED Final   Metapneumovirus NOT DETECTED NOT DETECTED Final   Rhinovirus / Enterovirus DETECTED (A) NOT DETECTED Final   Influenza A NOT DETECTED NOT DETECTED Final   Influenza B NOT DETECTED NOT DETECTED Final   Parainfluenza Virus 1 NOT DETECTED NOT DETECTED Final   Parainfluenza Virus 2 NOT DETECTED NOT DETECTED Final   Parainfluenza Virus 3 NOT DETECTED NOT DETECTED Final   Parainfluenza Virus 4 NOT DETECTED NOT DETECTED Final   Respiratory Syncytial Virus NOT DETECTED NOT DETECTED Final   Bordetella pertussis NOT DETECTED NOT DETECTED Final   Chlamydophila pneumoniae NOT DETECTED NOT DETECTED Final   Mycoplasma pneumoniae NOT DETECTED NOT DETECTED Final    Comment: Performed at [facility], [place] [address]  [place],  [address]   Time coordinating discharge: 35 minutes  SIGNED:  Kerney Elbe, DO [facility] 06/05/2016, 11:26 AM Pager [phone]  If 7PM-7AM, please contact night-coverage [email or web address] Password [email or web address]

## 2016-06-05 NOTE — Progress Notes (Signed)
Patient remains a&ox4, out of bed 1 assist walker. Discharge instructions reviewed with Milana Huntsman at [facility]. Qustions, concerns denied. Number provided for f/u if needed.

## 2016-06-05 NOTE — Clinical Social Work Placement (Signed)
   CLINICAL SOCIAL WORK PLACEMENT  NOTE  Date:  06/05/2016  Patient Details  Name: NEILA TEEM MRN: [id number] Date of Birth: 1924/08/24  Clinical Social Work is seeking post-discharge placement for this patient at the [place] level of care (*CSW will initial, date and re-position this form in  chart as items are completed):  Yes   Patient/family provided with [facility] Work Department's list of facilities offering this level of care within the geographic area requested by the patient (or if unable, by the patient's family).  Yes   Patient/family informed of their freedom to choose among providers that offer the needed level of care, that participate in Medicare, Medicaid or managed care program needed by the patient, have an available bed and are willing to accept the patient.  Yes   Patient/family informed of [facility]'s ownership interest in [facility] and [facility], as well as of the fact that they are under no obligation to receive care at these facilities.  PASRR submitted to EDS on 06/04/16     PASRR number received on 06/04/16     Existing PASRR number confirmed on       FL2 transmitted to all facilities in geographic area requested by pt/family on 06/04/16     FL2 transmitted to all facilities within larger geographic area on       Patient informed that his/her managed care company has contracts with or will negotiate with certain facilities, including the following:        Yes   Patient/family informed of bed offers received.  Patient chooses bed at [facility]     Physician recommends and patient chooses bed at      Patient to be transferred to [facility] on 06/05/16.  Patient to be transferred to facility by [facility]     Patient family notified on 06/05/16 of transfer.  Name of family member notified:  Daughter     PHYSICIAN       Additional Comment: Pt / daughter's are in agreement with d/c to [facility]  today. Pt requesting to transport by car. D/C Summary sent to SNF for review. Scripts included in d/c packet. # for report provided to nsg. D/C packet provided to pt/daughter.   _______________________________________________ Luretha Rued, [place] 06/05/2016, 2:18 PM

## 2016-06-07 DIAGNOSIS — J189 Pneumonia, unspecified organism: Secondary | ICD-10-CM | POA: Diagnosis not present

## 2016-06-07 DIAGNOSIS — J45909 Unspecified asthma, uncomplicated: Secondary | ICD-10-CM | POA: Diagnosis not present

## 2016-06-07 DIAGNOSIS — R5381 Other malaise: Secondary | ICD-10-CM | POA: Diagnosis not present

## 2016-06-07 DIAGNOSIS — I4891 Unspecified atrial fibrillation: Secondary | ICD-10-CM | POA: Diagnosis not present

## 2016-06-10 LAB — AMIODARONE LEVEL
Amiodarone Lvl: 1.4 ug/mL (ref 1.0–2.5)
N-Desethyl-Amiodarone: 1.6 ug/mL (ref 1.0–2.5)

## 2016-06-15 ENCOUNTER — Telehealth: Payer: Self-pay | Admitting: Family Medicine

## 2016-06-15 NOTE — Telephone Encounter (Signed)
Caller name: Darlina Guys   Relation to pt: LPN from [facility] Call back number: [phone]   Reason for call:  LPN wanted to inform PCP there's a delay in start of care PT will go out to patient home on the 6th of June, requesting verbal orders, please advise

## 2016-06-15 NOTE — Telephone Encounter (Signed)
Returned call. Verbal orders given to Darlina Guys, LPN with [facility].

## 2016-06-17 ENCOUNTER — Encounter: Payer: Self-pay | Admitting: Pulmonary Disease

## 2016-06-17 ENCOUNTER — Ambulatory Visit (INDEPENDENT_AMBULATORY_CARE_PROVIDER_SITE_OTHER): Payer: Medicare Other | Admitting: Pulmonary Disease

## 2016-06-17 VITALS — BP 118/70 | HR 67 | Ht 63.0 in | Wt 158.6 lb

## 2016-06-17 DIAGNOSIS — B348 Other viral infections of unspecified site: Secondary | ICD-10-CM

## 2016-06-17 DIAGNOSIS — J181 Lobar pneumonia, unspecified organism: Secondary | ICD-10-CM

## 2016-06-17 DIAGNOSIS — B341 Enterovirus infection, unspecified: Secondary | ICD-10-CM | POA: Diagnosis not present

## 2016-06-17 MED ORDER — FLUTTER DEVI: 0 refills | Status: DC

## 2016-06-17 NOTE — Patient Instructions (Signed)
1.  Use your Mucinex (guaifenesin) as instructed on the label  2.  Use the flutter valve (green) every 4 hours while awake to help clear secretions 3.  Follow up with Pulmonary in [place] in 3 months with CT review 4.  If you develop new symptoms (fever, chills, change in color of sputum production), please call the office to be seen or report to the ER for evaluation.   5.  So glad to hear you are feeling better!  Keep working on your nutrition & exercise.

## 2016-06-17 NOTE — Progress Notes (Signed)
[facility]   Chief Complaint  Patient presents with  . Hospitalization Follow-up    pt reports she is feeling much better, still has cough with some mucus production and rattles,      Primary Pulmonologist: Dr. Elsworth Soho (recently moved to [facility] and needs to change)  Current Outpatient Prescriptions on File Prior to Visit  Medication Sig  . acetaminophen (TYLENOL) 325 MG tablet Take 2 tablets (650 mg total) by mouth every 6 (six) hours as needed for mild pain or headache.  . albuterol (PROAIR HFA) 108 (90 Base) MCG/ACT inhaler Inhale 2 puffs into the lungs every 4 (four) hours as needed for wheezing or shortness of breath.  Marland Kitchen amiodarone (PACERONE) 200 MG tablet TAKE 1 TABLET BY MOUTH DAILY  . apixaban (ELIQUIS) 5 MG TABS tablet Take 1 tablet (5 mg total) by mouth 2 (two) times daily.  . budesonide-formoterol (SYMBICORT) 160-4.5 MCG/ACT inhaler INHALE 2 PUFFS FIRST THING EVERY MORNING AND 2 PUFFS EVERY 12 HOURS  . diltiazem (TIAZAC) 120 MG 24 hr capsule TAKE 1 CAPSULE BY MOUTH DAILY  . docusate sodium (COLACE) 100 MG capsule Take 200 mg by mouth daily as needed for mild constipation.  . famotidine (PEPCID) 20 MG tablet TAKE 1 TABLET BY MOUTH EVERY NIGHT AT BEDTIME  . feeding supplement, ENSURE ENLIVE, (ENSURE ENLIVE) LIQD Take 237 mLs by mouth 2 (two) times daily between meals.  Marland Kitchen guaiFENesin (MUCINEX) 600 MG 12 hr tablet Take 2 tablets (1,200 mg total) by mouth 2 (two) times daily.  Marland Kitchen levalbuterol (XOPENEX) 0.63 MG/3ML nebulizer solution Take 3 mLs (0.63 mg total) by nebulization every 6 (six) hours as needed for wheezing or shortness of breath.  . montelukast (SINGULAIR) 10 MG tablet TAKE 1 TABLET BY MOUTH EVERY DAY  . Multiple Vitamins-Minerals (MULTIVITAMIN WITH MINERALS) tablet Take 1 tablet by mouth daily.  . ondansetron (ZOFRAN) 4 MG tablet Take 1 tablet (4 mg total) by mouth every 8 (eight) hours as needed for nausea or vomiting.  . pantoprazole (PROTONIX) 40 MG tablet TAKE  1 TABLET BY MOUTH DAILY. TAKE 30-60 MINUTES BEFORE FIRST MEAL OF THE DAY  . predniSONE (STERAPRED UNI-PAK 21 TAB) 10 MG (21) TBPK tablet Take 6 Tablets Day 1, 5 Tablets Day 2, 4 Tablets Day 3, 3 Tablets Day 4, 2 Tablets Day 5, and 1 Tablet Day 6 and Stop on Day 7  . traMADol (ULTRAM) 50 MG tablet Take 1/2 or 1 tablet by mouth every 8 hours as needed for hip pain  . triamcinolone cream (KENALOG) 0.1 % Apply 1 application topically 2 (two) times daily. Apply to rash on side as needed   No current facility-administered medications on file prior to visit.      Studies:   Past Medical Hx:  has a past medical history of Arthritis; Asthma; Atrial fibrillation (HCC); CHF (congestive heart failure) ([place]); and PVC's (premature ventricular contractions).   Past Surgical hx, Allergies, Family hx, Social hx all reviewed.  Vital Signs BP 118/70 (BP Location: Left Arm, Cuff Size: Normal)   Pulse 67   Ht 5\' 3"  (1.6 m)   Wt 158 lb 9.6 oz (71.9 kg)   SpO2 94%   BMI 28.09 kg/m   History of Present Illness Crystal Kerr is a 83 y.o. female with a history of asthma, PAF on Eliquis, CHF and PVC's who presented to the pulmonary office for hospital follow up after PNA.  Hospital course notable for positive enterovirus / rhinovirus on respiratory viral panel.  CT demonstrated bilateral ground glass opacities in cluster nodules in a bronchovascular pattern, most concerning for acute PNA, larger nodule with possible area of cavitation but difficult to distinguish between bronchiectatic changes, LAN.  She was treated with rocephin + azithromycin (transitioned to azithro + vantin at d/c). Cultures were not obtained during the hospitalization.   She was discharged to a SNF for rehab but did not stay very long as she quickly improved and went home.  The patient reports improvement in symptoms > denies fevers, chills, chest pain.   She reports continued cough with clear sputum production.  She also notes that  her strength has improved but she continues to have shortness of breath with exertion and has to rest with activity.        Physical Exam  General - well developed adult F in no acute distress ENT - No sinus tenderness, no oral exudate, no LAN Cardiac - s1s2 regular, no murmur Chest - even/non-labored, lungs bilaterally coarse rhonchi, clears with cough, clear sputum production. No wheeze/rales Back - No focal tenderness Abd - Soft, non-tender Ext - 1+ BLE pitting edema  Neuro - Normal strength Skin - No rashes Psych - normal mood, and behavior   Assessment/Plan  1. Recent PNA - clinically improved, CT images reviewed personally and she has areas of ground glass opacities some with nodular appearance.  She carries a hx of recent bladder cancer s/p surgery.  Hospitalist was concerned for amiodarone toxicity but feel images do not correlate and she has no oxygen needs.  Larger concern are nodules with hx of bladder cancer. Suspect this is resolving infectious PNA.     Plan: Flutter valve given to patient with instructions OTC mucinex  Follow up with Dr. Elsworth Soho in 3 months with follow up CT to ensure resolution of nodular changes on CT  2. Enterovirus / Rhinovirus - with recent hospitalization for PNA  3. Former Tobacco Abuse   4. Asthma - well controlled, no evidence of acute exacerbation  Patient Instructions  1.  Use your Mucinex (guaifenesin) as instructed on the label  2.  Use the flutter valve (green) every 4 hours while awake to help clear secretions 3.  Follow up with Pulmonary in [place] in 3 months with CT review 4.  If you develop new symptoms (fever, chills, change in color of sputum production), please call the office to be seen or report to the ER for evaluation.   5.  So glad to hear you are feeling better!  Keep working on your nutrition & exercise.       Noe Gens, NP-C [place]  [phone] 06/17/2016, 11:05 AM

## 2016-06-24 ENCOUNTER — Ambulatory Visit (INDEPENDENT_AMBULATORY_CARE_PROVIDER_SITE_OTHER): Payer: Medicare Other | Admitting: Family Medicine

## 2016-06-24 ENCOUNTER — Ambulatory Visit (HOSPITAL_BASED_OUTPATIENT_CLINIC_OR_DEPARTMENT_OTHER)
Admission: RE | Admit: 2016-06-24 | Discharge: 2016-06-24 | Disposition: A | Discharge status: Home / Self Care | Payer: Medicare Other | Source: Ambulatory Visit | Attending: Family Medicine | Admitting: Family Medicine

## 2016-06-24 VITALS — BP 124/78 | HR 67 | Temp 98.1°F | Ht 62.0 in | Wt 161.2 lb

## 2016-06-24 DIAGNOSIS — Z09 Encounter for follow-up examination after completed treatment for conditions other than malignant neoplasm: Secondary | ICD-10-CM

## 2016-06-24 DIAGNOSIS — J181 Lobar pneumonia, unspecified organism: Secondary | ICD-10-CM | POA: Diagnosis not present

## 2016-06-24 DIAGNOSIS — R918 Other nonspecific abnormal finding of lung field: Secondary | ICD-10-CM | POA: Insufficient documentation

## 2016-06-24 DIAGNOSIS — Z5181 Encounter for therapeutic drug level monitoring: Secondary | ICD-10-CM

## 2016-06-24 DIAGNOSIS — I700 Atherosclerosis of aorta: Secondary | ICD-10-CM | POA: Insufficient documentation

## 2016-06-24 DIAGNOSIS — R05 Cough: Secondary | ICD-10-CM | POA: Diagnosis not present

## 2016-06-24 DIAGNOSIS — I481 Persistent atrial fibrillation: Secondary | ICD-10-CM

## 2016-06-24 DIAGNOSIS — R0602 Shortness of breath: Secondary | ICD-10-CM | POA: Diagnosis not present

## 2016-06-24 DIAGNOSIS — I4819 Other persistent atrial fibrillation: Secondary | ICD-10-CM

## 2016-06-24 LAB — COMPREHENSIVE METABOLIC PANEL
ALBUMIN: 3.5 g/dL (ref 3.5–5.2)
ALT: 17 U/L (ref 0–35)
AST: 22 U/L (ref 0–37)
Alkaline Phosphatase: 84 U/L (ref 39–117)
BILIRUBIN TOTAL: 0.4 mg/dL (ref 0.2–1.2)
BUN: 21 mg/dL (ref 6–23)
CALCIUM: 9.1 mg/dL (ref 8.4–10.5)
CO2: 27 mEq/L (ref 19–32)
CREATININE: 1.07 mg/dL (ref 0.40–1.20)
Chloride: 104 mEq/L (ref 96–112)
GFR: 51.25 mL/min — ABNORMAL LOW (ref >60.00)
Glucose, Bld: 167 mg/dL — ABNORMAL HIGH (ref 70–99)
Potassium: 4.1 mEq/L (ref 3.5–5.1)
Sodium: 137 mEq/L (ref 135–145)
TOTAL PROTEIN: 6.4 g/dL (ref 6.0–8.3)

## 2016-06-24 LAB — CBC
HCT: 34 % — ABNORMAL LOW (ref 36.0–46.0)
Hemoglobin: 11.1 g/dL — ABNORMAL LOW (ref 12.0–15.0)
MCHC: 32.6 g/dL (ref 30.0–36.0)
MCV: 84.5 fl (ref 78.0–100.0)
PLATELETS: 240 10*3/uL (ref 150.0–400.0)
RBC: 4.02 Mil/uL (ref 3.87–5.11)
RDW: 17 % — ABNORMAL HIGH (ref 11.5–15.5)
WBC: 7 10*3/uL (ref 4.0–10.5)

## 2016-06-24 LAB — PHOSPHORUS: PHOSPHORUS: 3.1 mg/dL (ref 2.3–4.6)

## 2016-06-24 NOTE — Progress Notes (Signed)
[facility] [address], [place], [place] [address] [phone]  Date:  06/24/2016   Name:  Crystal Kerr   DOB:  June 13, 1924   MRN:  [id number]  PCP:  [name], MD    Chief Complaint: Hospitalization Follow-up (Pt here for hosp f/u visit and states that she still has problems but feels much better. )   History of Present Illness:  Crystal Kerr is a 83 y.o. very pleasant female patient who presents with the following:  Here today for a hospital follow-up visit. She was in the hospital from 5/22 to 5/25, then was in skilled nursing for rehab and came home about 10 days ago.   She saw pulmonology last week She feels that her energy is coming back She does not have any pain with urination She does have some stress incont.  She is using pull up briefs   She is not using her flutter valve - feels that she is able to do deep breathing exercises better on her own She feels that her breathing is "great, much better."  She is coughning up a bit of mucus but this is getting better No fever No belly pain or vomiting She lost some weight during her illness but is gaining it back Overall she is states that she feels very well  Wt Readings from Last 3 Encounters:  06/24/16 161 lb 3.2 oz (73.1 kg)  06/17/16 158 lb 9.6 oz (71.9 kg)  06/02/16 164 lb 0.4 oz (74.4 kg)     Admit date: 06/02/2016 Discharge date: 06/05/2016  Admitted From: Home Disposition:  SNF  Recommendations for Outpatient Follow-up:  1. Follow up with PCP in 1-2 weeks 2. Follow up with Pulmonology Dr. Melvyn Novas at D/C to further evaluate Atypical Pneumonia and for ? Amiodarone Toxicity; Has an appointment with NP Johnella Moloney on June 17, 2016 at 10:00 am. 3. Follow up with [facility] for follow up for Hematuria 4. Follow up with Cardiology Dr. Rogue Jury ad D/C for Atrial Fibrillation and ?Amiodarone Toxicity  5. Please obtain  CMP/CBC in one week 6. Repeat CXR in outpatient setting  7. Please follow up on the following pending results: Amiodarone Level  Home Health: No Equipment/Devices: TBD at [facility]   Discharge Condition: Stable CODE STATUS: FULL CODE Diet recommendation: Heart Healthy   Brief/Interim Summary: Crystal Feigenbaum Sappingtonis a 83 y.o.femalewith medical history significant of osteoarthritis, asthma, paroxysmal atrial fibrillation on Eliquis, CHF, PVCs and other comorbid's who is coming to the Emergency Department with complaints of brown sputum producing cough, dyspnea, wheezing, decreased appetite, increased somnolence, fatigue and malaise. She denied diarrhea at this time, but stated that about a week or so ago she had a "stomach flu" with some loose stools. She denies abdominal pain, nausea, emesis, melena or hematochezia. She denies dysuria or hematuria. Chest CT was done and showed groundglass opacities and cluster nodules in bronchovascular distribution most compatible with acute bronchopneumonia and compatible with Atypical Pneumonia. Patient was found to have positive Enterovirus/Rhinovius on Respiratory Viral Panel. She is much improved and states she feels almost back to her baseline. At this time patient has been deemed medically stable and will D/C to SNF to continue Strength training. She will need to follow up with [facility], Pulmonology, Cardiology and PCP at D/C.  Discharge Diagnoses:  Principal Problem:   Atypical pneumonia Active Problems:   Dyspnea   Intrinsic asthma   Shortness  of breath   AF (paroxysmal atrial fibrillation) (HCC)   Atypical Pneumonia in the setting of Rhinovirus/Enterovirus  -CT of Chest showed Ground-glass opacities in clustered nodules in bronchovasculardistribution most compatible with acute bronchopneumonia. There is a suggestion of cavitation within the larger nodules, although this is somewhat difficult to distinguish from bronchiectatic  changes. Findings suggest an atypical infection including mycobacterial or fungal pneumonia. Mediastinal lymphadenopathy, probably reactive. -Admitted to telemetry/inpatient. -Continue supplemental oxygen as needed. We'll continue bronchodilators. -Changed Azithromycin 500 mg IVPB and Ceftriaxone 1 g IV PB every 24 hours to po Azithromycinand po Vantin  -Check a sputum Gram stain culture and sensitivity (pending). -Check a strep pneumonia and legionella urinary antigens (pending). -Checked Respiratory Viral Panel and was positive for Rhinovirus/Enterovirus  -C/w Xopenex prn -C/w Mucinex 1200 mg po BID  -WBC went from 15.0 ->12.1 ->9.1 -> 13.1 (Likely Steroid Demargination) -C/w Flutter Valve and Incentive Spirometer -Added IV Solumedrol 60 mg q12h as patient was wheezing and will switch to Prednisone Taper tomorrow -Added Hypertonic Saline Nebs to Therapy yesterday -Repeat CXR this AM showed  Slight improvement in the bilateral pneumonia since 3 days ago, though moderate ground-glass airspace opacities persist, particularly in the upper lobes and right lower lobe. No new abnormalities. -? Amiodarone Toxicity with Ground Glass opacities -Will need to follow up with Cardiology Dr. Rogue Jury and Pulmonology Dr. Melvyn Novas in the outpatient setting -Repeat CXR as an outpatient  Intrinsic Asthma -Supplemental oxygen as needed. -Continue bronchodilators with Symbicort 2 puff RT BID and Montelukast 10 mg po Daily -C/w Nebs as above -Had mild wheezing so continued IV Solumedrol today and transitioned to po Prednisone Taper iN AM  AF (paroxysmal atrial fibrillation) ([place]) -CHA2DS2-VASc Scoreof at least 4. -Continue Amiodarone for rate control for now; Will need to discuss with Cardiology about possible Amiodarone Toxicity as an outpatient -Check Amiodarone Level and ordered and pending -C/w Diltiazem 120 mg po Daily  -Continue Anticoagulation with Apixaban 5 mg po  BID  Hypophosphatemia -Patient's Phos was 2.2 this AM -Replete with IV Sodium Phos 30 mmol -Repeat Phos Level at SNF  Mildly Abnormal LFTs/Transaminitis  -AST went from 59 -> 67 and ALT went from 42 -> 55 -Unclear Etiology; Decreased po prn Tylenol Dose -Continue to Monitor and Repeat CMP at SNF to follow Trend  GERD -C/w Famotidine 20 mg po BID and with Pantoprazole 40 mg po Daily  Right Eye Conjunctivitis -C/w Polysporin in Right Eye 4 times a day  Hx of Bladder Cancer  -Had some blood tinged urine this Afternoon; Continue to To Monitor  -Follow up as an outpatient with [facility] as an outpatient  -Recent office visit for Hematuria on 05/18/16 -Will attempt to get Office Visit notes   Hx of Diastolic CHF -Not Decompensated -Continue to Monitor Volume Status -Strict I's and O's and Daily Weights -I's and O's are not accurate since patient is incontinent and Patient is + 2.243 -Follow up with Cardiology as an outpatient  Hypertension -Patient's BP was elevated yesterday -C/w Cardizem 120 mg po Daily -When needed will add IV Hydralazine 5 mg q6hprn for SBP > 180 or DBP > 100  Leukocytosis -Patient's WBC increased from 9.1 -> 13.9 -Likely from Steroid Demargination as patient has been on IV Steroids with Solumedrol -Repeat CBC in AM  She is done with abx.  She will have some swelling of her ankles as the day goes on, but they are ok in the am No falls She is using her walker and feels  safe getting around at home She does not drive She declines any further PT- does not feel that she needs it Her cardiologist is [name]- she will scheudule to see him soon   Patient Active Problem List   Diagnosis Date Noted  . Atypical pneumonia 06/02/2016  . AF (paroxysmal atrial fibrillation) ([place]) 06/02/2016  . Bilateral leg edema 12/19/2013  . 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 Benign paroxysmal  positional vertigo 09/13/2013  . Upper airway cough syndrome 07/10/2013  . Allergic rhinitis 07/05/2013  . Persistent atrial fibrillation ([place]) 03/30/2013  . Shortness of breath 02/06/2013  . Acute on chronic diastolic congestive heart failure ([place]) 02/04/2013  . Arthritis of spine ([place]) 07/01/2012  . Intrinsic asthma 07/01/2012  . Dyspnea 04/12/2012  . PVC's (premature ventricular contractions) 04/24/2011  . Murmur, cardiac 04/24/2011    Past Medical History:  Diagnosis Date  . Arthritis   . Asthma   . Atrial fibrillation ([place])   . CHF (congestive heart failure) ([place])   . PVC's (premature ventricular contractions)     Past Surgical History:  Procedure Laterality Date  . APPENDECTOMY    . CARDIOVERSION N/A 03/08/2013   Procedure: CARDIOVERSION;  Surgeon: Larey Dresser, MD;  Location: [place] [facility];  Service: Cardiovascular;  Laterality: N/A;  . CHOLECYSTECTOMY    . oophrectomy    . TEE WITHOUT CARDIOVERSION N/A 03/08/2013   Procedure: TRANSESOPHAGEAL ECHOCARDIOGRAM (TEE);  Surgeon: Larey Dresser, MD;  Location: [facility];  Service: Cardiovascular;  Laterality: N/A;  . TONSILLECTOMY      Social History  Substance Use Topics  . Smoking status: Former Smoker    Packs/day: 1.00    Years: 20.00    Types: Cigarettes    Quit date: 04/13/1981  . Smokeless tobacco: Never Used  . Alcohol use Yes     Comment: Occasional    Family History  Problem Relation Age of Onset  . Heart failure Mother   . Diabetes Father   . Heart disease Unknown        No family history    No Known Allergies  Medication list has been reviewed and updated.  Current Outpatient Prescriptions on File Prior to Visit  Medication Sig Dispense Refill  . acetaminophen (TYLENOL) 325 MG tablet Take 2 tablets (650 mg total) by mouth every 6 (six) hours as needed for mild pain or headache. 30 tablet 0  . albuterol (PROAIR HFA) 108 (90 Base) MCG/ACT inhaler Inhale 2 puffs into the lungs every 4 (four)  hours as needed for wheezing or shortness of breath. 18 g 5  . amiodarone (PACERONE) 200 MG tablet TAKE 1 TABLET BY MOUTH DAILY 90 tablet 1  . apixaban (ELIQUIS) 5 MG TABS tablet Take 1 tablet (5 mg total) by mouth 2 (two) times daily. 60 tablet 0  . budesonide-formoterol (SYMBICORT) 160-4.5 MCG/ACT inhaler INHALE 2 PUFFS FIRST THING EVERY MORNING AND 2 PUFFS EVERY 12 HOURS 10.2 g 12  . diltiazem (TIAZAC) 120 MG 24 hr capsule TAKE 1 CAPSULE BY MOUTH DAILY 90 capsule 3  . docusate sodium (COLACE) 100 MG capsule Take 200 mg by mouth daily as needed for mild constipation.    . famotidine (PEPCID) 20 MG tablet TAKE 1 TABLET BY MOUTH EVERY NIGHT AT BEDTIME 90 tablet 0  . guaiFENesin (MUCINEX) 600 MG 12 hr tablet Take 2 tablets (1,200 mg total) by mouth 2 (two) times daily. 20 tablet 0  . montelukast (  SINGULAIR) 10 MG tablet TAKE 1 TABLET BY MOUTH EVERY DAY 30 tablet 0  . Multiple Vitamins-Minerals (MULTIVITAMIN WITH MINERALS) tablet Take 1 tablet by mouth daily.    . pantoprazole (PROTONIX) 40 MG tablet TAKE 1 TABLET BY MOUTH DAILY. TAKE 30-60 MINUTES BEFORE FIRST MEAL OF THE DAY 30 tablet 3  . Respiratory Therapy Supplies (FLUTTER) DEVI Use as directed 1 each 0  . traMADol (ULTRAM) 50 MG tablet Take 1/2 or 1 tablet by mouth every 8 hours as needed for hip pain 10 tablet 1  . triamcinolone cream (KENALOG) 0.1 % Apply 1 application topically 2 (two) times daily. Apply to rash on side as needed 30 g 1   No current facility-administered medications on file prior to visit.     Review of Systems:  As per HPI- otherwise negative.   Physical Examination: Vitals:   06/24/16 1132  BP: 124/78  Pulse: 67  Temp: 98.1 F (36.7 C)   Vitals:   06/24/16 1132  Weight: 161 lb 3.2 oz (73.1 kg)  Height: 5\' 2"  (1.575 m)   Body mass index is 29.48 kg/m. Ideal Body Weight: Weight in (lb) to have BMI = 25: 136.4  GEN: WDWN, NAD, Non-toxic, A & O x 3, looks well, here with one of her daughter's today.    HEENT: Atraumatic, Normocephalic. Neck supple. No masses, No LAD. Ears and Nose: No external deformity. CV: RRR, No M/G/R. No JVD. No thrill. No extra heart sounds. PULM: CTA B, no wheezes, crackles, rhonchi. No retractions. No resp. distress. No accessory muscle use. ABD: S, NT, ND EXTR: No c/c. Trace edema of both ankles  NEURO sitting in WC today- it is a long walk to my office PSYCH: Normally interactive. Conversant. Not depressed or anxious appearing.  Calm demeanor.  Seems to be in sinus rhythm today  Assessment and Plan: Hospital discharge follow-up - Plan: CBC, Comprehensive metabolic panel, DG Chest 2 View  Lobar pneumonia ([place]) - Plan: DG Chest 2 View  Medication monitoring encounter - Plan: CBC, Comprehensive metabolic panel  Persistent atrial fibrillation (HCC)  Hypophosphatasia - Plan: Phosphorus  Here to follow-up from admission for pneumonia and then to rehab  She is feeling much better thankfully Repeat CXR today is reassuring as below Obtain labs as well Plan follow-up pending labs and x-ray  CLINICAL DATA:  Cough and shortness of breath.  History of asthma.  EXAM: CHEST  2 VIEW  COMPARISON:  06/05/2016  FINDINGS: Cardiomediastinal silhouette is normal. Mediastinal contours appear intact. Calcific atherosclerotic disease and tortuosity of the aorta.  There is no evidence of focal airspace consolidation, pleural effusion or pneumothorax. Coarsening of the interstitial markings and mild peribronchial thickening present.  Osseous structures are without acute abnormality. Soft tissues are grossly normal.  IMPRESSION: Coarsening of the interstitial markings and mild peribronchial thickening.  Calcific atherosclerotic disease of the aorta.  Results for orders placed or performed in visit on 06/24/16  CBC  Result Value Ref Range   WBC 7.0 4.0 - 10.5 K/uL   RBC 4.02 3.87 - 5.11 Mil/uL   Platelets 240.0 150.0 - 400.0 K/uL   Hemoglobin 11.1  (L) 12.0 - 15.0 g/dL   HCT 34.0 (L) 36.0 - 46.0 %   MCV 84.5 78.0 - 100.0 fl   MCHC 32.6 30.0 - 36.0 g/dL   RDW 17.0 (H) 11.5 - 15.5 %  Comprehensive metabolic panel  Result Value Ref Range   Sodium 137 135 - 145 mEq/L   Potassium 4.1 3.5 -  5.1 mEq/L   Chloride 104 96 - 112 mEq/L   CO2 27 19 - 32 mEq/L   Glucose, Bld 167 (H) 70 - 99 mg/dL   BUN 21 6 - 23 mg/dL   Creatinine, Ser 1.07 0.40 - 1.20 mg/dL   Total Bilirubin 0.4 0.2 - 1.2 mg/dL   Alkaline Phosphatase 84 39 - 117 U/L   AST 22 0 - 37 U/L   ALT 17 0 - 35 U/L   Total Protein 6.4 6.0 - 8.3 g/dL   Albumin 3.5 3.5 - 5.2 g/dL   Calcium 9.1 8.4 - 10.5 mg/dL   GFR 51.25 (L) >60.00 mL/min  Phosphorus  Result Value Ref Range   Phosphorus 3.1 2.3 - 4.6 mg/dL     Signed Lamar Blinks, MD

## 2016-06-24 NOTE — Patient Instructions (Signed)
It was very nice to see you again today!  I am so glad that you are feeling better! We will get labs and a chest x-ray for you today Please do schedule a visit with [name] soon for follow-up

## 2016-06-26 ENCOUNTER — Encounter: Payer: Self-pay | Admitting: Family Medicine

## 2016-07-02 ENCOUNTER — Other Ambulatory Visit: Payer: Self-pay | Admitting: Physician Assistant

## 2016-07-02 ENCOUNTER — Other Ambulatory Visit: Payer: Self-pay | Admitting: Family Medicine

## 2016-07-03 ENCOUNTER — Other Ambulatory Visit: Payer: Self-pay | Admitting: Emergency Medicine

## 2016-07-17 ENCOUNTER — Other Ambulatory Visit: Payer: Self-pay | Admitting: Family Medicine

## 2016-07-17 ENCOUNTER — Other Ambulatory Visit: Payer: Self-pay | Admitting: Physician Assistant

## 2016-07-20 ENCOUNTER — Telehealth: Payer: Self-pay | Admitting: Cardiology

## 2016-07-20 NOTE — Telephone Encounter (Signed)
New message    Pt c/o swelling: STAT is pt has developed SOB within 24 hours  1. How long have you been experiencing swelling? Daughter has noticed for a few days  2. Where is the swelling located? Both legs-more than one leg than the other.   3.  Are you currently taking a "fluid pill"? no  4.  Are you currently SOB? Per daughter pt has SOB a lot because she has asthma  5.  Have you traveled recently? no

## 2016-07-20 NOTE — Telephone Encounter (Signed)
Returned call to patient's daughter [name].Stated mother is having swelling in both lower legs and feet.Sob is no worse.She does not weigh herself.Stated appointment was scheduled with Bernerd Pho PA 7/20.Stated she would like her to be seen sooner.She would like appointment to be scheduled at [facility] office.Message sent to [facility] RN.

## 2016-07-20 NOTE — Telephone Encounter (Signed)
Spoke with pt dtr, aware 1st available with dr [name] in [place] would be august 1st. They will keep the appointment with brittney. She was encouraged to have patient elevated legs as much as possible.

## 2016-07-30 NOTE — Progress Notes (Signed)
Cardiology Office Note    Date:  07/31/2016   ID:  Crystal, Kerr 1924-07-29, MRN [id number]  PCP:  Crystal Mclean, MD  Cardiologist: [name]   Chief Complaint  Patient presents with  . Follow-up    lower extremity edema    History of Present Illness:    Crystal Kerr is a 83 y.o. female with past medical history of PAF (s/p DCCV in 2015, on Amiodarone and Eliquis), HTN, and PVC's who presents to the office today for evaluation of worsening lower extremity edema.   She was last examined by [name] in 03/2016 and reported doing well from a cardiac perspective at that time. She had been undergoing workup for hematuria with recent nodules found in her bladder suspicious for bladder carcinoma. She was admitted to [facility] in 05/2016 for PNA in the setting of Rhinovirus and Enterovirus. There was concern for Amiodarone toxicity but she followed up with Pulmonology as an outpatient but images were not felt to correlate with this.   In talking with the patient today, she reports having worsening lower extremity edema for the past 3 weeks. This is initially up to her ankles but has continually increased and is now up to her knees. Reports her legs are sore as they feel very "tight". She denies any recent dyspnea on exertion, orthopnea, PND, chest pain, or palpitations. Says her breathing is the best it has been in years. She does not weigh regularly and is unsure if weight has increased in the past few weeks. She is up 5 lbs by our scales (161 lbs --> 166 lbs).   Reports good compliance with her medication regimen including Amiodarone, Eliquis, and Diltiazem. She denies any evidence of active bleeding. No recent medication changes. She does consume a very high sodium diet according to her daughter, as she consumes soups on a daily basis along with eating canned goods and adding salt to food.    Past Medical History:  Diagnosis Date  . Arthritis   .  Asthma   . Atrial fibrillation ([place])    a. s/p DCCV in 2015, on Amiodarone and Eliquis  . PVC's (premature ventricular contractions)     Past Surgical History:  Procedure Laterality Date  . APPENDECTOMY    . CARDIOVERSION N/A 03/08/2013   Procedure: CARDIOVERSION;  Surgeon: Crystal Dresser, MD;  Location: [place];  Service: Cardiovascular;  Laterality: N/A;  . CHOLECYSTECTOMY    . oophrectomy    . TEE WITHOUT CARDIOVERSION N/A 03/08/2013   Procedure: TRANSESOPHAGEAL ECHOCARDIOGRAM (TEE);  Surgeon: Crystal Dresser, MD;  Location: [facility] ENDOSCOPY;  Service: Cardiovascular;  Laterality: N/A;  . TONSILLECTOMY      Current Medications: Outpatient Medications Prior to Visit  Medication Sig Dispense Refill  . acetaminophen (TYLENOL) 325 MG tablet Take 2 tablets (650 mg total) by mouth every 6 (six) hours as needed for mild pain or headache. 30 tablet 0  . albuterol (PROAIR HFA) 108 (90 Base) MCG/ACT inhaler Inhale 2 puffs into the lungs every 4 (four) hours as needed for wheezing or shortness of breath. 18 g 5  . amiodarone (PACERONE) 200 MG tablet TAKE 1 TABLET BY MOUTH DAILY 90 tablet 1  . apixaban (ELIQUIS) 5 MG TABS tablet Take 1 tablet (5 mg total) by mouth 2 (two) times daily. 60 tablet 0  . diltiazem (TIAZAC) 120 MG 24 hr capsule TAKE 1 CAPSULE BY MOUTH DAILY 90 capsule 3  . docusate sodium (COLACE) 100 MG  capsule Take 200 mg by mouth daily as needed for mild constipation.    . famotidine (PEPCID) 20 MG tablet TAKE 1 TABLET BY MOUTH EVERY NIGHT AT BEDTIME 90 tablet 0  . guaiFENesin (MUCINEX) 600 MG 12 hr tablet Take 2 tablets (1,200 mg total) by mouth 2 (two) times daily. 20 tablet 0  . montelukast (SINGULAIR) 10 MG tablet TAKE 1 TABLET BY MOUTH EVERY DAY 90 tablet 0  . Multiple Vitamins-Minerals (MULTIVITAMIN WITH MINERALS) tablet Take 1 tablet by mouth daily.    . pantoprazole (PROTONIX) 40 MG tablet TAKE 1 TABLET BY MOUTH DAILY 30 TO 60 MINUTES BEFORE FIRST MEAL OF THE DAY 30 tablet  0  . SYMBICORT 160-4.5 MCG/ACT inhaler INHALE 2 PUFFS FIRST THING IN THE MORNING, AND 2 MORE IN 12 HOURS 10.2 g 12  . traMADol (ULTRAM) 50 MG tablet Take 1/2 or 1 tablet by mouth every 8 hours as needed for hip pain 10 tablet 1  . triamcinolone cream (KENALOG) 0.1 % Apply 1 application topically 2 (two) times daily. Apply to rash on side as needed 30 g 1  . Respiratory Therapy Supplies (FLUTTER) DEVI Use as directed 1 each 0   No facility-administered medications prior to visit.      Allergies:   Patient has no known allergies.   Social History   Social History  . Marital status: Unknown    Spouse name: N/A  . Number of children: N/A  . Years of education: N/A   Occupational History  . Retired Retired   Social History Main Topics  . Smoking status: Former Smoker    Packs/day: 1.00    Years: 20.00    Types: Cigarettes    Quit date: 04/13/1981  . Smokeless tobacco: Never Used  . Alcohol use Yes     Comment: Occasional  . Drug use: No  . Sexual activity: No   Other Topics Concern  . None   Social History Narrative  . None     Family History:  The patient's family history includes Diabetes in her father; Heart disease in her unknown relative; Heart failure in her mother.   Review of Systems:   Please see the history of present illness.     General:  No chills, fever, night sweats or weight changes.  Cardiovascular:  No chest pain, dyspnea on exertion, orthopnea, palpitations, paroxysmal nocturnal dyspnea. Positive for lower extremity edema.  Dermatological: No rash, lesions/masses Respiratory: No cough, dyspnea Urologic: No hematuria, dysuria Abdominal:   No nausea, vomiting, diarrhea, bright red blood per rectum, melena, or hematemesis Neurologic:  No visual changes, wkns, changes in mental status. All other systems reviewed and are otherwise negative except as noted above.   Physical Exam:    VS:  BP 124/68 (BP Location: Left Arm, Cuff Size: Normal)   Pulse 63    Ht 5\' 2"  (1.575 m)   Wt 166 lb (75.3 kg)   SpO2 99%   BMI 30.36 kg/m    General: Well developed, well nourished elderly Caucasian female appearing in no acute distress. Head: Normocephalic, atraumatic, sclera non-icteric, no xanthomas, nares are without discharge.  Neck: No carotid bruits. JVD not elevated.  Lungs: Respirations regular and unlabored, without wheezes or rales.  Heart: Regular rate and rhythm. No S3 or S4.  No murmur, no rubs, or gallops appreciated. Abdomen: Soft, non-tender, non-distended with normoactive bowel sounds. No hepatomegaly. No rebound/guarding. No obvious abdominal masses. Msk:  Strength and tone appear normal for age. No joint deformities  or effusions. Extremities: No clubbing or cyanosis. 2+ pitting edema up to mid-shins bilaterally.  Distal pedal pulses are 2+ bilaterally. Neuro: Alert and oriented X 3. Moves all extremities spontaneously. No focal deficits noted. Psych:  Responds to questions appropriately with a normal affect. Skin: No rashes or lesions noted  Wt Readings from Last 3 Encounters:  07/31/16 166 lb (75.3 kg)  06/24/16 161 lb 3.2 oz (73.1 kg)  06/17/16 158 lb 9.6 oz (71.9 kg)    Studies/Labs Reviewed:   EKG:  EKG is not ordered today.    Recent Labs: 04/08/2016: TSH 1.940 06/02/2016: B Natriuretic Peptide 176.2 06/05/2016: Magnesium 1.9 06/24/2016: ALT 17; BUN 21; Creatinine, Ser 1.07; Hemoglobin 11.1; Platelets 240.0; Potassium 4.1; Sodium 137   Lipid Panel    Component Value Date/Time   CHOL 228 (H) 04/22/2011 0000   TRIG 157 (H) 04/22/2011 0000   HDL 50 04/22/2011 0000   CHOLHDL 4.6 04/22/2011 0000   VLDL 31 04/22/2011 0000   LDLCALC 147 (H) 04/22/2011 0000    Additional studies/ records that were reviewed today include:   TEE: 03/08/2013 Study Conclusions  - Left ventricle: The cavity size was normal. Wall thickness was normal. Systolic function was normal. The estimated ejection fraction was in the range of 55% to  60%. Wall motion was normal; there were no regional wall motion abnormalities. - Aortic valve: Lambl's excrescence noted. There was no stenosis. - Aorta: Normal caliber with mild plaque. - Mitral valve: Trivial regurgitation. - Left atrium: The atrium was mildly to moderately dilated. No evidence of thrombus in the atrial cavity or appendage. - Right ventricle: The cavity size was normal. Systolic function was normal. - Right atrium: No evidence of thrombus in the atrial cavity or appendage. - Atrial septum: No defect or patent foramen ovale was identified. Echo contrast study showed no right-to-left atrial level shunt, at baseline or with provocation. - Tricuspid valve: Peak RV-RA gradient: 71mm Hg (S). Recommendations: May proceed to cardioversion.  Assessment:    1. Lower extremity edema   2. Medication management   3. Paroxysmal atrial fibrillation (HCC)   4. Current use of long term anticoagulation   5. PVC's (premature ventricular contractions)      Plan:   In order of problems listed above:  1. Lower Extremity Edema - she reports worsening lower extremity edema for the past 3 weeks. Denies any associated dyspnea on exertion, orthopnea, PND, chest pain, or palpitations. Says her breathing is "the best it has been in years".  - weight has increased by 5 lbs on our scales (161 lbs --> 166 lbs).  - she does have significant edema on examination but thankfully lungs are clear. Worsening edema is likely secondary to her significant sodium intake. We reviewed limiting sodium in her diet along with fluid restrictions. Recommended the utilization of compression stockings and elevating her feet when sitting. Will start Lasix 20mg  daily with plans for a repeat BMET in 1 week to assess creatinine and K+ levels. I recommended she follow-up with [name] or myself in 2 weeks but she wishes to avoid coming to [facility] as she lives in [place]. Therefore, I have  asked her to keep a list of daily weights and call us in one week with her numbers. Will follow-up in [place] in 3 weeks. If this becomes a recurrent issue or does not improve with Lasix and sodium restriction, she will need an echocardiogram to assess for structural abnormalities.   2. Paroxysmal Atrial Fibrillation -  she denies any recent chest pain or palpitations.  - continue Diltiazem CD 120mg  daily for rate-control along with Amiodarone 200mg  daily.   3. Long-Term Anticoagulation - she denies any evidence of active bleeding. Continue Eliquis 5mg  BID for anticoagulation.  4. PVC's - no recent palpitations.  - continue current medication regimen.    Medication Adjustments/Labs and Tests Ordered: Current medicines are reviewed at length with the patient today.  Concerns regarding medicines are outlined above.  Medication changes, Labs and Tests ordered today are listed in the Patient Instructions below. Patient Instructions  Medication Instructions:  Your physician has recommended you make the following change in your medication:  1.  START Lasix 20 mg daily  Labwork: 1 WEEK:  BMET  Testing/Procedures: None ordered  Follow-Up: Your physician recommends that you schedule a follow-up appointment in:    Any Other Special Instructions Will Be Listed Below (If Applicable).  1.  Your physician recommends that you weigh, daily, at the same time every day, and in the same amount of clothing. Please record your daily weights on the handout and call us with those in 1 week  2.  Wear compression stockings  3.  Eat a Banana every day that you take Lasix  If you need a refill on your cardiac medications before your next appointment, please call your pharmacy.  Signed, Erma Heritage, PA-C  07/31/2016 12:43 PM    [address], [place], [place]  [address] Phone: [phone]; Fax: [phone]  [address], [place]  [place], [place] [address] Phone: [phone]

## 2016-07-31 ENCOUNTER — Encounter: Payer: Self-pay | Admitting: Student

## 2016-07-31 ENCOUNTER — Ambulatory Visit (INDEPENDENT_AMBULATORY_CARE_PROVIDER_SITE_OTHER): Payer: Medicare Other | Admitting: Student

## 2016-07-31 VITALS — BP 124/68 | HR 63 | Ht 62.0 in | Wt 166.0 lb

## 2016-07-31 DIAGNOSIS — Z79899 Other long term (current) drug therapy: Secondary | ICD-10-CM | POA: Diagnosis not present

## 2016-07-31 DIAGNOSIS — I493 Ventricular premature depolarization: Secondary | ICD-10-CM | POA: Diagnosis not present

## 2016-07-31 DIAGNOSIS — Z7901 Long term (current) use of anticoagulants: Secondary | ICD-10-CM | POA: Insufficient documentation

## 2016-07-31 DIAGNOSIS — I480 Paroxysmal atrial fibrillation: Secondary | ICD-10-CM

## 2016-07-31 DIAGNOSIS — R600 Localized edema: Secondary | ICD-10-CM

## 2016-07-31 MED ORDER — FUROSEMIDE 20 MG PO TABS: 20.0000 mg | ORAL_TABLET | Freq: Every day | ORAL | 0 refills | Status: DC

## 2016-07-31 NOTE — Patient Instructions (Addendum)
Medication Instructions:  Your physician has recommended you make the following change in your medication:  1.  START Lasix 20 mg daily  Labwork: 1 WEEK:  BMET  Testing/Procedures: None ordered  Follow-Up: Your physician recommends that you schedule a follow-up appointment in:    Any Other Special Instructions Will Be Listed Below (If Applicable).  1.  Your physician recommends that you weigh, daily, at the same time every day, and in the same amount of clothing. Please record your daily weights on the handout and call us with those in 1 week  2.  Wear compression stockings  3.  Eat a Banana every day that you take Lasix       If you need a refill on your cardiac medications before your next appointment, please call your pharmacy.   DASH Eating Plan DASH stands for "Dietary Approaches to Stop Hypertension." The DASH eating plan is a healthy eating plan that has been shown to reduce high blood pressure (hypertension). It may also reduce your risk for type 2 diabetes, heart disease, and stroke. The DASH eating plan may also help with weight loss. What are tips for following this plan? General guidelines  Avoid eating more than 2,000 mg (milligrams) of salt (sodium) a day. If you have hypertension, you may need to reduce your sodium intake to 1,500 mg a day.  Limit alcohol intake to no more than 1 drink a day for nonpregnant women and 2 drinks a day for men. One drink equals 12 oz of beer, 5 oz of wine, or 1 oz of hard liquor.  Work with your health care provider to maintain a healthy body weight or to lose weight. Ask what an ideal weight is for you.  Get at least 30 minutes of exercise that causes your heart to beat faster (aerobic exercise) most days of the week. Activities may include walking, swimming, or biking.  Work with your health care provider or diet and nutrition specialist (dietitian) to adjust your eating plan to your individual calorie needs. Reading food  labels  Check food labels for the amount of sodium per serving. Choose foods with less than 5 percent of the Daily Value of sodium. Generally, foods with less than 300 mg of sodium per serving fit into this eating plan.  To find whole grains, look for the word "whole" as the first word in the ingredient list. Shopping  Buy products labeled as "low-sodium" or "no salt added."  Buy fresh foods. Avoid canned foods and premade or frozen meals. Cooking  Avoid adding salt when cooking. Use salt-free seasonings or herbs instead of table salt or sea salt. Check with your health care provider or pharmacist before using salt substitutes.  Do not fry foods. Cook foods using healthy methods such as baking, boiling, grilling, and broiling instead.  Cook with heart-healthy oils, such as olive, canola, soybean, or sunflower oil. Meal planning   Eat a balanced diet that includes: ? 5 or more servings of fruits and vegetables each day. At each meal, try to fill half of your plate with fruits and vegetables. ? Up to 6-8 servings of whole grains each day. ? Less than 6 oz of lean meat, poultry, or fish each day. A 3-oz serving of meat is about the same size as a deck of cards. One egg equals 1 oz. ? 2 servings of low-fat dairy each day. ? A serving of nuts, seeds, or beans 5 times each week. ? Heart-healthy fats. Healthy fats called  Omega-3 fatty acids are found in foods such as flaxseeds and coldwater fish, like sardines, salmon, and mackerel.  Limit how much you eat of the following: ? Canned or prepackaged foods. ? Food that is high in trans fat, such as fried foods. ? Food that is high in saturated fat, such as fatty meat. ? Sweets, desserts, sugary drinks, and other foods with added sugar. ? Full-fat dairy products.  Do not salt foods before eating.  Try to eat at least 2 vegetarian meals each week.  Eat more home-cooked food and less restaurant, buffet, and fast food.  When eating at a  restaurant, ask that your food be prepared with less salt or no salt, if possible. What foods are recommended? The items listed may not be a complete list. Talk with your dietitian about what dietary choices are best for you. Grains Whole-grain or whole-wheat bread. Whole-grain or whole-wheat pasta. Brown rice. [place]. Bulgur. Whole-grain and low-sodium cereals. Pita bread. Low-fat, low-sodium crackers. Whole-wheat flour tortillas. Vegetables Fresh or frozen vegetables (raw, steamed, roasted, or grilled). Low-sodium or reduced-sodium tomato and vegetable juice. Low-sodium or reduced-sodium tomato sauce and tomato paste. Low-sodium or reduced-sodium canned vegetables. Fruits All fresh, dried, or frozen fruit. Canned fruit in natural juice (without added sugar). Meat and other protein foods Skinless chicken or [place]. Ground chicken or [place]. Pork with fat trimmed off. Fish and seafood. Egg whites. Dried beans, peas, or lentils. Unsalted nuts, nut butters, and seeds. Unsalted canned beans. Lean cuts of beef with fat trimmed off. Low-sodium, lean deli meat. Dairy Low-fat (1%) or fat-free (skim) milk. Fat-free, low-fat, or reduced-fat cheeses. Nonfat, low-sodium ricotta or cottage cheese. Low-fat or nonfat yogurt. Low-fat, low-sodium cheese. Fats and oils Soft margarine without trans fats. Vegetable oil. Low-fat, reduced-fat, or light mayonnaise and salad dressings (reduced-sodium). Canola, safflower, olive, soybean, and sunflower oils. Avocado. Seasoning and other foods Herbs. Spices. Seasoning mixes without salt. Unsalted popcorn and pretzels. Fat-free sweets. What foods are not recommended? The items listed may not be a complete list. Talk with your dietitian about what dietary choices are best for you. Grains Baked goods made with fat, such as croissants, muffins, or some breads. Dry pasta or rice meal packs. Vegetables Creamed or fried vegetables. Vegetables in a cheese sauce.  Regular canned vegetables (not low-sodium or reduced-sodium). Regular canned tomato sauce and paste (not low-sodium or reduced-sodium). Regular tomato and vegetable juice (not low-sodium or reduced-sodium). Angie Fava. Olives. Fruits Canned fruit in a light or heavy syrup. Fried fruit. Fruit in cream or butter sauce. Meat and other protein foods Fatty cuts of meat. Ribs. Fried meat. Berniece Salines. Sausage. Bologna and other processed lunch meats. Salami. Fatback. Hotdogs. Bratwurst. Salted nuts and seeds. Canned beans with added salt. Canned or smoked fish. Whole eggs or egg yolks. Chicken or [place] with skin. Dairy Whole or 2% milk, cream, and half-and-half. Whole or full-fat cream cheese. Whole-fat or sweetened yogurt. Full-fat cheese. Nondairy creamers. Whipped toppings. Processed cheese and cheese spreads. Fats and oils Butter. Stick margarine. Lard. Shortening. Ghee. Bacon fat. Tropical oils, such as coconut, palm kernel, or palm oil. Seasoning and other foods Salted popcorn and pretzels. Onion salt, garlic salt, seasoned salt, table salt, and sea salt. Worcestershire sauce. Tartar sauce. Barbecue sauce. Teriyaki sauce. Soy sauce, including reduced-sodium. Steak sauce. Canned and packaged gravies. Fish sauce. Oyster sauce. Cocktail sauce. Horseradish that you find on the shelf. Ketchup. Mustard. Meat flavorings and tenderizers. Bouillon cubes. Hot sauce and Tabasco sauce. Premade or packaged marinades. Premade  or packaged taco seasonings. Relishes. Regular salad dressings. Where to find more information:  National Heart, Lung, and [place]: https://wilson-eaton.com/  American Heart Association: www.heart.org Summary  The DASH eating plan is a healthy eating plan that has been shown to reduce high blood pressure (hypertension). It may also reduce your risk for type 2 diabetes, heart disease, and stroke.  With the DASH eating plan, you should limit salt (sodium) intake to 2,300 mg a day. If you have  hypertension, you may need to reduce your sodium intake to 1,500 mg a day.  When on the DASH eating plan, aim to eat more fresh fruits and vegetables, whole grains, lean proteins, low-fat dairy, and heart-healthy fats.  Work with your health care provider or diet and nutrition specialist (dietitian) to adjust your eating plan to your individual calorie needs. This information is not intended to replace advice given to you by your health care provider. Make sure you discuss any questions you have with your health care provider. Document Released: 12/18/2010 Document Revised: 12/23/2015 Document Reviewed: 12/23/2015 Elsevier Interactive Patient Education  2017 [employer].

## 2016-08-10 ENCOUNTER — Telehealth: Payer: Self-pay | Admitting: Cardiology

## 2016-08-10 NOTE — Telephone Encounter (Signed)
Returned call to daughter [name]. Patient states she notices some improvement in swelling. Patient has gained 2lbs in the last 10 days. Patient has no more SOB than usual. Patient is urinating often (maybe not more UOP). Patient states her pants are feeling tight in her abdomen. Per daughter, patient has OV 7/31/ @ 130pm with Maude Leriche, PA. Advised would notify PA and call if she had recommendations prior to her appt tomorrow.

## 2016-08-10 NOTE — Telephone Encounter (Signed)
Will see tomorrow and reassess volume status. Will need a repeat BMET at that time to assess kidney function. Thank you

## 2016-08-10 NOTE — Progress Notes (Signed)
Cardiology Office Note    Date:  08/11/2016   ID:  Crystal Kerr, Crystal Kerr [birth year], MRN [id number]  PCP:  [name], MD  Cardiologist: [name]  Chief Complaint  Patient presents with  . Follow-up    Edema, Weight Gain    History of Present Illness:    Crystal Kerr is a 83 y.o. female with past medical history of PAF (s/p DCCV in 2015, on Amiodarone and Eliquis), HTN, and PVC's who presents to the office today for 2-week follow-up.   She was last examined by myself on 07/31/2016 and reported having worsening lower extremity edema with weights up by 5 lbs on our office scales (161 --> 166 lbs). Was consuming a high-sodium diet consisting of soups, canned goods, and adding extra salt to her foods. She did have 2+ pitting edema on examination, therefore she was started on Lasix 20mg  daily with plans for a repeat BMET in 1 week.   In talking with the patient today, she reports that her lower extremity edema has improved overall. She did notice worsening abdominal distention yesterday but reports this improved after having a bowel movement. She denies any orthopnea, PND, or acute worsening of her dyspnea on exertion. No recent chest pain or dyspnea on exertion. Reports weights have been variable on her home scales but have been between 159 lbs and 161 lbs with a reported baseline of 158 lbs. Weight has declined by 2 lbs on the office scales since her visit on 07/31/2016.   She has significantly reduced her sodium intake and has cut out all soups and is no longer consuming canned goods. Reports still having to partake in "some potato chips" at times. She has been elevating her legs as much as possible.    Past Medical History:  Diagnosis Date  . Arthritis   . Asthma   . Atrial fibrillation ([place])    a. s/p DCCV in 2015, on Amiodarone and Eliquis  . PVC's (premature ventricular contractions)     Past Surgical History:  Procedure Laterality Date  .  APPENDECTOMY    . CARDIOVERSION N/A 03/08/2013   Procedure: CARDIOVERSION;  Surgeon: Larey Dresser, MD;  Location: [place];  Service: Cardiovascular;  Laterality: N/A;  . CHOLECYSTECTOMY    . oophrectomy    . TEE WITHOUT CARDIOVERSION N/A 03/08/2013   Procedure: TRANSESOPHAGEAL ECHOCARDIOGRAM (TEE);  Surgeon: Larey Dresser, MD;  [facility] ENDOSCOPY;  Service: Cardiovascular;  Laterality: N/A;  . TONSILLECTOMY      Current Medications: Outpatient Medications Prior to Visit  Medication Sig Dispense Refill  . acetaminophen (TYLENOL) 325 MG tablet Take 2 tablets (650 mg total) by mouth every 6 (six) hours as needed for mild pain or headache. 30 tablet 0  . albuterol (PROAIR HFA) 108 (90 Base) MCG/ACT inhaler Inhale 2 puffs into the lungs every 4 (four) hours as needed for wheezing or shortness of breath. 18 g 5  . amiodarone (PACERONE) 200 MG tablet TAKE 1 TABLET BY MOUTH DAILY 90 tablet 1  . apixaban (ELIQUIS) 5 MG TABS tablet Take 1 tablet (5 mg total) by mouth 2 (two) times daily. 60 tablet 0  . diltiazem (TIAZAC) 120 MG 24 hr capsule TAKE 1 CAPSULE BY MOUTH DAILY 90 capsule 3  . docusate sodium (COLACE) 100 MG capsule Take 200 mg by mouth daily as needed for mild constipation.    . famotidine (PEPCID) 20 MG tablet TAKE 1 TABLET BY MOUTH EVERY NIGHT AT BEDTIME 90 tablet  0  . furosemide (LASIX) 20 MG tablet Take 1 tablet (20 mg total) by mouth daily. 30 tablet 0  . guaiFENesin (MUCINEX) 600 MG 12 hr tablet Take 2 tablets (1,200 mg total) by mouth 2 (two) times daily. 20 tablet 0  . montelukast (SINGULAIR) 10 MG tablet TAKE 1 TABLET BY MOUTH EVERY DAY 90 tablet 0  . Multiple Vitamins-Minerals (MULTIVITAMIN WITH MINERALS) tablet Take 1 tablet by mouth daily.    . pantoprazole (PROTONIX) 40 MG tablet TAKE 1 TABLET BY MOUTH DAILY 30 TO 60 MINUTES BEFORE FIRST MEAL OF THE DAY 30 tablet 0  . SYMBICORT 160-4.5 MCG/ACT inhaler INHALE 2 PUFFS FIRST THING IN THE MORNING, AND 2 MORE IN 12 HOURS  10.2 g 12  . traMADol (ULTRAM) 50 MG tablet Take 1/2 or 1 tablet by mouth every 8 hours as needed for hip pain 10 tablet 1  . triamcinolone cream (KENALOG) 0.1 % Apply 1 application topically 2 (two) times daily. Apply to rash on side as needed 30 g 1   No facility-administered medications prior to visit.      Allergies:   Patient has no known allergies.   Social History   Social History  . Marital status: Unknown    Spouse name: N/A  . Number of children: N/A  . Years of education: N/A   Occupational History  . Retired Retired   Social History Main Topics  . Smoking status: Former Smoker    Packs/day: 1.00    Years: 20.00    Types: Cigarettes    Quit date: 04/13/1981  . Smokeless tobacco: Never Used  . Alcohol use Yes     Comment: Occasional  . Drug use: No  . Sexual activity: No   Other Topics Concern  . None   Social History Narrative  . None     Family History:  The patient's family history includes Diabetes in her father; Heart disease in her unknown relative; Heart failure in her mother.   Review of Systems:   Please see the history of present illness.     General:  No chills, fever, night sweats or weight changes.  Cardiovascular:  No chest pain, dyspnea on exertion, orthopnea, palpitations, paroxysmal nocturnal dyspnea. Positive for lower extremity edema.  Dermatological: No rash, lesions/masses Respiratory: No cough, dyspnea Urologic: No hematuria, dysuria Abdominal:   No nausea, vomiting, diarrhea, bright red blood per rectum, melena, or hematemesis Neurologic:  No visual changes, wkns, changes in mental status. All other systems reviewed and are otherwise negative except as noted above.   Physical Exam:    VS:  BP (!) 118/58   Pulse (!) 59   Ht 5\' 2"  (1.575 m)   Wt 164 lb 3.2 oz (74.5 kg)   SpO2 97%   BMI 30.03 kg/m    General: Well developed, elderly Caucasian female appearing in no acute distress. Head: Normocephalic, atraumatic, sclera  non-icteric, no xanthomas, nares are without discharge.  Neck: No carotid bruits. JVD not elevated.  Lungs: Respirations regular and unlabored, without wheezes or rales.  Heart: Regular rate and rhythm. No S3 or S4.  No murmur, no rubs, or gallops appreciated. Abdomen: Soft, non-tender, non-distended with normoactive bowel sounds. No hepatomegaly. No rebound/guarding. No obvious abdominal masses. Msk:  Strength and tone appear normal for age. No joint deformities or effusions. Extremities: No clubbing or cyanosis. 1+ pitting edema up to mid-shins bilaterally.  Distal pedal pulses are 2+ bilaterally. Neuro: Alert and oriented X 3. Moves all extremities spontaneously.  No focal deficits noted. Psych:  Responds to questions appropriately with a normal affect. Skin: No rashes or lesions noted  Wt Readings from Last 3 Encounters:  08/11/16 164 lb 3.2 oz (74.5 kg)  07/31/16 166 lb (75.3 kg)  06/24/16 161 lb 3.2 oz (73.1 kg)     Studies/Labs Reviewed:   EKG:  EKG is not ordered today.   Recent Labs: 04/08/2016: TSH 1.940 06/02/2016: B Natriuretic Peptide 176.2 06/05/2016: Magnesium 1.9 06/24/2016: ALT 17; BUN 21; Creatinine, Ser 1.07; Hemoglobin 11.1; Platelets 240.0; Potassium 4.1; Sodium 137   Lipid Panel    Component Value Date/Time   CHOL 228 (H) 04/22/2011 0000   TRIG 157 (H) 04/22/2011 0000   HDL 50 04/22/2011 0000   CHOLHDL 4.6 04/22/2011 0000   VLDL 31 04/22/2011 0000   LDLCALC 147 (H) 04/22/2011 0000    Additional studies/ records that were reviewed today include:   TEE: 02/2013 Study Conclusions  - Left ventricle: The cavity size was normal. Wall thickness was normal. Systolic function was normal. The estimated ejection fraction was in the range of 55% to 60%. Wall motion was normal; there were no regional wall motion abnormalities. - Aortic valve: Lambl's excrescence noted. There was no stenosis. - Aorta: Normal caliber with mild plaque. - Mitral valve:  Trivial regurgitation. - Left atrium: The atrium was mildly to moderately dilated. No evidence of thrombus in the atrial cavity or appendage. - Right ventricle: The cavity size was normal. Systolic function was normal. - Right atrium: No evidence of thrombus in the atrial cavity or appendage. - Atrial septum: No defect or patent foramen ovale was identified. Echo contrast study showed no right-to-left atrial level shunt, at baseline or with provocation. - Tricuspid valve: Peak RV-RA gradient: 55mm Hg (S).   Assessment:    1. Acute on chronic diastolic congestive heart failure ([place])   2. Lower extremity edema   3. Paroxysmal atrial fibrillation ([facility])   4. Current use of long term anticoagulation   5. PVC's (premature ventricular contractions)   6. Intrinsic asthma      Plan:   In order of problems listed above:  1. Acute on Chronic Diastolic CHF/ Lower Extremity Edema - EF 55-60% by echo in 2015. She had been experiencing worsening lower extremity edema and was started on Lasix 20mg  daily on 07/31/2016. Weight has decreased by 4 lbs on her home scales and she is close to her baseline weight of 158 lbs (at 159 lbs this AM). Edema has significantly improved by physical examination, but she does have 1+ pitting edema up to her mid-shins bilaterally.  - she has reduced her sodium intake and continued compliance with a low-sodium diet was encouraged. Recommended consuming less than 2L of fluid per day. She has been elevating her legs. Is physically unable to put on compression stockings by herself.  - continue Lasix 20mg  daily at this time. Recheck BMET today. If creatinine is trending upwards, would reduce Lasix dosing to every other day, otherwise continue on current dosing until edema has resolved.   2. Paroxysmal Atrial Fibrillation/ Long-term Anticoagulation - HR remains well-controlled in the high-50's today and she is maintaining NSR by examination. Continue Amiodarone  200mg  daily and Cardizem CD 120mg  daily.  - This patients CHA2DS2-VASc Score and unadjusted Ischemic Stroke Rate (% per year) is equal to 3.2 % stroke rate/year from a score of 3 (Female, Age (2)). She denies any evidence of active bleeding. Continue Eliquis 5mg  BID for anticoagulation.   3. PVC's -  she denies any recent palpitations. - continue Cardizem CD 120mg  daily.   4. Asthma - followed by Pulmonology. Uses Symbicort and Singulair.    Medication Adjustments/Labs and Tests Ordered: Current medicines are reviewed at length with the patient today.  Concerns regarding medicines are outlined above.  Medication changes, Labs and Tests ordered today are listed in the Patient Instructions below. Patient Instructions  Medication Instructions: Your physician recommends that you continue on your current medications as directed. Please refer to the Current Medication list given to you today.  If you need a refill on your cardiac medications before your next appointment, please call your pharmacy.   Labwork: Your physician recommends that you have the following labs drawn today: BMET and BNP  Follow-Up: Please keep your follow up on 8/15 with [name].   Thank you for choosing Heartcare at [facility]!!      Signed, Erma Heritage, PA-C  08/11/2016 8:38 PM    [address], [place], [place]  [address] Phone: [phone]; Fax: [phone]  [address] [place], [place] [address] Phone: [phone]

## 2016-08-10 NOTE — Telephone Encounter (Signed)
New Message     Pt c/o swelling: STAT is pt has developed SOB within 24 hours  1. How long have you been experiencing swelling?  A couple weeks   2. Where is the swelling located? All over 2lbs since last week  3.  Are you currently taking a "fluid pill"?  Yes 20 mg lasix  4.  Are you currently SOB?   No , abdomin is uncomfortable 5.  Have you traveled recently?  no

## 2016-08-10 NOTE — Telephone Encounter (Signed)
Note made on appt notes that patient needs BMET per PA

## 2016-08-11 ENCOUNTER — Encounter: Payer: Self-pay | Admitting: Student

## 2016-08-11 ENCOUNTER — Ambulatory Visit (INDEPENDENT_AMBULATORY_CARE_PROVIDER_SITE_OTHER): Payer: Medicare Other | Admitting: Student

## 2016-08-11 VITALS — BP 118/58 | HR 59 | Ht 62.0 in | Wt 164.2 lb

## 2016-08-11 DIAGNOSIS — I493 Ventricular premature depolarization: Secondary | ICD-10-CM

## 2016-08-11 DIAGNOSIS — J45909 Unspecified asthma, uncomplicated: Secondary | ICD-10-CM | POA: Diagnosis not present

## 2016-08-11 DIAGNOSIS — I5033 Acute on chronic diastolic (congestive) heart failure: Secondary | ICD-10-CM | POA: Diagnosis not present

## 2016-08-11 DIAGNOSIS — R600 Localized edema: Secondary | ICD-10-CM | POA: Diagnosis not present

## 2016-08-11 DIAGNOSIS — I480 Paroxysmal atrial fibrillation: Secondary | ICD-10-CM

## 2016-08-11 DIAGNOSIS — R0609 Other forms of dyspnea: Secondary | ICD-10-CM | POA: Diagnosis not present

## 2016-08-11 DIAGNOSIS — Z7901 Long term (current) use of anticoagulants: Secondary | ICD-10-CM | POA: Diagnosis not present

## 2016-08-11 NOTE — Patient Instructions (Addendum)
Medication Instructions: Your physician recommends that you continue on your current medications as directed. Please refer to the Current Medication list given to you today.  If you need a refill on your cardiac medications before your next appointment, please call your pharmacy.   Labwork: Your physician recommends that you have the following labs drawn today: BMET and BNP   Follow-Up: Please keep your follow up on 8/15 with [name].   Thank you for choosing Heartcare at [facility]!!

## 2016-08-12 ENCOUNTER — Telehealth: Payer: Self-pay | Admitting: *Deleted

## 2016-08-12 LAB — BASIC METABOLIC PANEL
BUN / CREAT RATIO: 18 (ref 12–28)
BUN: 25 mg/dL (ref 8–27)
CALCIUM: 8.8 mg/dL (ref 8.7–10.3)
CO2: 25 mmol/L (ref 20–29)
CREATININE: 1.38 mg/dL — AB (ref 0.57–1.00)
Chloride: 100 mmol/L (ref 96–106)
GFR calc Af Amer: 39 mL/min/{1.73_m2} — ABNORMAL LOW (ref >59)
GFR calc non Af Amer: 34 mL/min/{1.73_m2} — ABNORMAL LOW (ref >59)
Glucose: 115 mg/dL — ABNORMAL HIGH (ref 65–99)
Potassium: 4.2 mmol/L (ref 3.5–5.2)
Sodium: 142 mmol/L (ref 134–144)

## 2016-08-12 LAB — BRAIN NATRIURETIC PEPTIDE: BNP: 120.1 pg/mL — ABNORMAL HIGH (ref 0.0–100.0)

## 2016-08-12 MED ORDER — FUROSEMIDE 20 MG PO TABS: 20.0000 mg | ORAL_TABLET | ORAL | 0 refills | Status: DC

## 2016-08-12 NOTE — Telephone Encounter (Signed)
-----   Message from Erma Heritage, [place] sent at 08/12/2016 12:23 PM EDT ----- Please let the patient know her BNP (fluid level) is only minimally elevated. Kidney function has increased, therefore would reduce Lasix dosing to every other day until she follows up with [name] in 2 weeks. Continue to reduce sodium and fluid restriction (< 2L per day). Potassium levels remain stable. Thank you!

## 2016-08-15 ENCOUNTER — Other Ambulatory Visit: Payer: Self-pay | Admitting: Family Medicine

## 2016-08-24 NOTE — Progress Notes (Signed)
HPI: FU atrial fibrillation. Patient had episode of atrial fibrillation in February 2015 in the setting of viral gastroenteritis. She had a TEE guided cardioversion and has maintained sinus rhythm on amiodarone. TEE February 2015 showed normal LV function, lambl's excrescence, mild to moderate left atrial enlargement. Patient recently seen in the office for lower extremity edema and low sodium diet/Lasix 20 mg daily initiated. Since she was last seen she has some dyspnea on exertion which is unchanged. No orthopnea, PND, chest pain or syncope. Minimal pedal edema.  Current Outpatient Prescriptions  Medication Sig Dispense Refill  . acetaminophen (TYLENOL) 325 MG tablet Take 2 tablets (650 mg total) by mouth every 6 (six) hours as needed for mild pain or headache. 30 tablet 0  . albuterol (PROAIR HFA) 108 (90 Base) MCG/ACT inhaler Inhale 2 puffs into the lungs every 4 (four) hours as needed for wheezing or shortness of breath. 18 g 5  . amiodarone (PACERONE) 200 MG tablet TAKE 1 TABLET BY MOUTH DAILY 90 tablet 1  . apixaban (ELIQUIS) 5 MG TABS tablet Take 1 tablet (5 mg total) by mouth 2 (two) times daily. 60 tablet 0  . diltiazem (TIAZAC) 120 MG 24 hr capsule TAKE 1 CAPSULE BY MOUTH DAILY 90 capsule 3  . famotidine (PEPCID) 20 MG tablet TAKE 1 TABLET BY MOUTH EVERY NIGHT AT BEDTIME 90 tablet 0  . furosemide (LASIX) 20 MG tablet Take 1 tablet (20 mg total) by mouth every other day. 30 tablet 0  . guaiFENesin (MUCINEX) 600 MG 12 hr tablet Take 2 tablets (1,200 mg total) by mouth 2 (two) times daily. 20 tablet 0  . montelukast (SINGULAIR) 10 MG tablet TAKE 1 TABLET BY MOUTH EVERY DAY 90 tablet 0  . Multiple Vitamins-Minerals (MULTIVITAMIN WITH MINERALS) tablet Take 1 tablet by mouth daily.    . pantoprazole (PROTONIX) 40 MG tablet TAKE 1 TABLET BY MOUTH DAILY 30 TO 60 MINUTES BEFORE FIRST MEAL OF THE DAY 30 tablet 0  . polyethylene glycol (MIRALAX / GLYCOLAX) packet Take 17 g by mouth daily as  needed.    . SYMBICORT 160-4.5 MCG/ACT inhaler INHALE 2 PUFFS FIRST THING IN THE MORNING, AND 2 MORE IN 12 HOURS 10.2 g 12  . traMADol (ULTRAM) 50 MG tablet Take 1/2 or 1 tablet by mouth every 8 hours as needed for hip pain 10 tablet 1  . triamcinolone cream (KENALOG) 0.1 % Apply 1 application topically 2 (two) times daily. Apply to rash on side as needed 30 g 1   No current facility-administered medications for this visit.      Past Medical History:  Diagnosis Date  . Arthritis   . Asthma   . Atrial fibrillation ([place])    a. s/p DCCV in 2015, on Amiodarone and Eliquis  . PVC's (premature ventricular contractions)     Past Surgical History:  Procedure Laterality Date  . APPENDECTOMY    . CARDIOVERSION N/A 03/08/2013   Procedure: CARDIOVERSION;  Surgeon: Larey Dresser, MD;  Location: [place];  Service: Cardiovascular;  Laterality: N/A;  . CHOLECYSTECTOMY    . oophrectomy    . TEE WITHOUT CARDIOVERSION N/A 03/08/2013   Procedure: TRANSESOPHAGEAL ECHOCARDIOGRAM (TEE);  Surgeon: Larey Dresser, MD;  Location[place];  Service: Cardiovascular;  Laterality: N/A;  . TONSILLECTOMY      Social History   Social History  . Marital status: Unknown    Spouse name: N/A  . Number of children: N/A  . Years of  education: N/A   Occupational History  . Retired Retired   Social History Main Topics  . Smoking status: Former Smoker    Packs/day: 1.00    Years: 20.00    Types: Cigarettes    Quit date: 04/13/1981  . Smokeless tobacco: Never Used  . Alcohol use Yes     Comment: Occasional  . Drug use: No  . Sexual activity: No   Other Topics Concern  . Not on file   Social History Narrative  . No narrative on file    Family History  Problem Relation Age of Onset  . Heart failure Mother   . Diabetes Father   . Heart disease Unknown        No family history    ROS: no fevers or chills, productive cough, hemoptysis, dysphasia, odynophagia, melena, hematochezia,  dysuria, hematuria, rash, seizure activity, orthopnea, PND, claudication. Remaining systems are negative.  Physical Exam: Well-developed well-nourished in no acute distress.  Skin is warm and dry.  HEENT is normal.  Neck is supple.  Chest is clear to auscultation with normal expansion.  Cardiovascular exam is regular rate and rhythm.  Abdominal exam nontender or distended. No masses palpated. Extremities show trace edema. neuro grossly intact    A/P  1 paroxysmal atrial fibrillation-patient remains in sinus rhythm on examination. Continue amiodarone at present dose. Continue apixaban. At next office visit we will check chest x-ray, TSH, liver functions, hemoglobin and renal function.  2 hypertension-blood pressure is controlled for age. Continue present medications.  3 chronic diastolic congestive heart failure-improved with low-dose Lasix. She reports some fatigue that she attributes to Lasix. I have asked her to take Lasix 20 mg as needed daily. We also discussed the importance of fluid restriction and low sodium diet. Check potassium and renal function.  Kirk Ruths, MD

## 2016-08-26 ENCOUNTER — Encounter: Payer: Self-pay | Admitting: Cardiology

## 2016-08-26 ENCOUNTER — Ambulatory Visit (INDEPENDENT_AMBULATORY_CARE_PROVIDER_SITE_OTHER): Payer: Medicare Other | Admitting: Cardiology

## 2016-08-26 ENCOUNTER — Other Ambulatory Visit: Payer: Self-pay | Admitting: Cardiology

## 2016-08-26 VITALS — BP 145/70 | HR 57 | Ht 62.0 in | Wt 160.4 lb

## 2016-08-26 DIAGNOSIS — I5032 Chronic diastolic (congestive) heart failure: Secondary | ICD-10-CM | POA: Diagnosis not present

## 2016-08-26 DIAGNOSIS — I10 Essential (primary) hypertension: Secondary | ICD-10-CM | POA: Diagnosis not present

## 2016-08-26 DIAGNOSIS — I480 Paroxysmal atrial fibrillation: Secondary | ICD-10-CM | POA: Diagnosis not present

## 2016-08-26 NOTE — Patient Instructions (Signed)

## 2016-08-27 LAB — BASIC METABOLIC PANEL
BUN / CREAT RATIO: 20 (ref 12–28)
BUN: 24 mg/dL (ref 8–27)
CALCIUM: 8.9 mg/dL (ref 8.7–10.3)
CHLORIDE: 99 mmol/L (ref 96–106)
CO2: 27 mmol/L (ref 20–29)
Creatinine, Ser: 1.2 mg/dL — ABNORMAL HIGH (ref 0.57–1.00)
GFR, EST AFRICAN AMERICAN: 46 mL/min/{1.73_m2} — AB (ref >59)
GFR, EST NON AFRICAN AMERICAN: 40 mL/min/{1.73_m2} — AB (ref >59)
Glucose: 87 mg/dL (ref 65–99)
POTASSIUM: 4.2 mmol/L (ref 3.5–5.2)
SODIUM: 140 mmol/L (ref 134–144)

## 2016-09-07 ENCOUNTER — Other Ambulatory Visit: Payer: Self-pay | Admitting: *Deleted

## 2016-09-07 MED ORDER — APIXABAN 5 MG PO TABS: 5.0000 mg | ORAL_TABLET | Freq: Two times a day (BID) | ORAL | 5 refills | Status: DC

## 2016-09-08 ENCOUNTER — Other Ambulatory Visit: Payer: Self-pay | Admitting: Pharmacist Clinician (PhC)/ Clinical Pharmacy Specialist

## 2016-09-08 MED ORDER — APIXABAN 5 MG PO TABS: 5.0000 mg | ORAL_TABLET | Freq: Two times a day (BID) | ORAL | 5 refills | Status: DC

## 2016-09-17 ENCOUNTER — Other Ambulatory Visit: Payer: Self-pay | Admitting: Family Medicine

## 2016-09-22 ENCOUNTER — Ambulatory Visit (HOSPITAL_BASED_OUTPATIENT_CLINIC_OR_DEPARTMENT_OTHER)
Admission: RE | Admit: 2016-09-22 | Discharge: 2016-09-22 | Disposition: A | Discharge status: Home / Self Care | Payer: Medicare Other | Source: Ambulatory Visit | Attending: Pulmonary Disease | Admitting: Pulmonary Disease

## 2016-09-22 DIAGNOSIS — R05 Cough: Secondary | ICD-10-CM | POA: Diagnosis not present

## 2016-09-22 DIAGNOSIS — I700 Atherosclerosis of aorta: Secondary | ICD-10-CM | POA: Insufficient documentation

## 2016-09-22 DIAGNOSIS — R918 Other nonspecific abnormal finding of lung field: Secondary | ICD-10-CM | POA: Insufficient documentation

## 2016-09-22 DIAGNOSIS — J181 Lobar pneumonia, unspecified organism: Secondary | ICD-10-CM | POA: Insufficient documentation

## 2016-09-22 DIAGNOSIS — R0989 Other specified symptoms and signs involving the circulatory and respiratory systems: Secondary | ICD-10-CM | POA: Diagnosis not present

## 2016-09-24 ENCOUNTER — Ambulatory Visit (INDEPENDENT_AMBULATORY_CARE_PROVIDER_SITE_OTHER): Payer: Medicare Other | Admitting: Pulmonary Disease

## 2016-09-24 ENCOUNTER — Encounter: Payer: Self-pay | Admitting: Pulmonary Disease

## 2016-09-24 DIAGNOSIS — J45909 Unspecified asthma, uncomplicated: Secondary | ICD-10-CM

## 2016-09-24 DIAGNOSIS — J189 Pneumonia, unspecified organism: Secondary | ICD-10-CM

## 2016-09-24 DIAGNOSIS — R0600 Dyspnea, unspecified: Secondary | ICD-10-CM | POA: Diagnosis not present

## 2016-09-24 NOTE — Assessment & Plan Note (Signed)
Dramatic improvement in infiltrates consistent with diagnosis of pneumonia, unlikely to be amiodarone toxicity given significant improvement

## 2016-09-24 NOTE — Progress Notes (Signed)
   Subjective:    Patient ID: Crystal Kerr, female    DOB: 07-Mar-1924, 83 y.o.   MRN: [id number]  HPI  83 y.o. female who presented to the pulmonary office for hospital follow up after PNA.05/2016 - positive  rhinovirus on respiratory viral panel.  CT demonstrated bilateral ground glass opacities in cluster nodules in a bronchovascular pattern, most concerning for acute PNA, larger nodule with possible area of cavitation but difficult to distinguish between bronchiectatic changes , differential diagnosis was amiodarone toxicity  She has improved significantly but continues to have mild shortness of breath. She complains of nasal congestion and a hoarse voice. She is compliant with Symbicort for asthma. She's had asthma all her life. Albuterol usage is seldom Also maintained on Protonix for GERD  PMH -of asthma, PAF on Eliquis, CHF and PVC's , Bladder cancer  We reviewed CT chest from 9/11 which shows significant improvement in bilateral infiltrates with mild residual groundglass and both upper lobes  Significant tests/ events reviewed   PFTs 03/20/14   FEV1  1.28 (85%) ratio 75 s resp to saba  and dlco 68 corrects to 103  Review of Systems neg for any significant sore throat, dysphagia, itching, sneezing, nasal congestion or excess/ purulent secretions, fever, chills, sweats, unintended wt loss, pleuritic or exertional cp, hempoptysis, orthopnea pnd or change in chronic leg swelling. Also denies presyncope, palpitations, heartburn, abdominal pain, nausea, vomiting, diarrhea or change in bowel or urinary habits, dysuria,hematuria, rash, arthralgias, visual complaints, headache, numbness weakness or ataxia.     Objective:   Physical Exam   Gen. Pleasant, obese, in no distress ENT - no lesions, no post nasal drip Neck: No JVD, no thyromegaly, no carotid bruits Lungs: no use of accessory muscles, no dullness to percussion, decreased without rales or rhonchi  Cardiovascular: Rhythm  regular, heart sounds  normal, no murmurs or gallops, no peripheral edema Musculoskeletal: No deformities, no cyanosis or clubbing , no tremors        Assessment & Plan:

## 2016-09-24 NOTE — Assessment & Plan Note (Signed)
Residual dyspnea may be related to deconditioning from recent acute illness

## 2016-09-24 NOTE — Patient Instructions (Signed)
CT scan shows changes of pneumonia are much improved Stay on Symbicort  Call us as needed

## 2016-09-24 NOTE — Assessment & Plan Note (Signed)
Continue Symbicort twice daily and albuterol as needed only

## 2016-10-14 ENCOUNTER — Other Ambulatory Visit: Payer: Self-pay

## 2016-10-14 ENCOUNTER — Telehealth: Payer: Self-pay | Admitting: Family Medicine

## 2016-10-14 MED ORDER — MONTELUKAST SODIUM 10 MG PO TABS: 10.0000 mg | ORAL_TABLET | Freq: Every day | ORAL | 0 refills | Status: DC

## 2016-10-14 NOTE — Telephone Encounter (Signed)
SINGULAIR needs refilled per daughter. Pt uses [facility] on [address]. Pt is out of it and no refills. [name] [phone].

## 2016-10-15 ENCOUNTER — Other Ambulatory Visit: Payer: Self-pay | Admitting: Emergency Medicine

## 2016-10-15 ENCOUNTER — Other Ambulatory Visit: Payer: Self-pay | Admitting: Physician Assistant

## 2016-10-15 ENCOUNTER — Other Ambulatory Visit: Payer: Self-pay | Admitting: Cardiology

## 2016-10-15 DIAGNOSIS — L600 Ingrowing nail: Secondary | ICD-10-CM | POA: Diagnosis not present

## 2016-10-15 MED ORDER — MONTELUKAST SODIUM 10 MG PO TABS: 10.0000 mg | ORAL_TABLET | Freq: Every day | ORAL | 0 refills | Status: DC

## 2016-10-15 NOTE — Telephone Encounter (Signed)
Refill sent.

## 2016-10-16 NOTE — Telephone Encounter (Signed)
REFILL 

## 2016-10-17 ENCOUNTER — Other Ambulatory Visit: Payer: Self-pay | Admitting: Family Medicine

## 2016-11-09 ENCOUNTER — Telehealth: Payer: Self-pay | Admitting: Family Medicine

## 2016-11-09 NOTE — Telephone Encounter (Signed)
Called pt to schedule AWV. Lvm for pt to call office to schedule appt.   *Pt has never received AWV before. Appt can be scheduled at anytime. SF

## 2016-11-14 DIAGNOSIS — H02051 Trichiasis without entropian right upper eyelid: Secondary | ICD-10-CM | POA: Diagnosis not present

## 2016-11-14 DIAGNOSIS — H2513 Age-related nuclear cataract, bilateral: Secondary | ICD-10-CM | POA: Diagnosis not present

## 2016-11-28 DIAGNOSIS — Z23 Encounter for immunization: Secondary | ICD-10-CM | POA: Diagnosis not present

## 2016-12-31 ENCOUNTER — Other Ambulatory Visit: Payer: Self-pay | Admitting: Family Medicine

## 2016-12-31 DIAGNOSIS — M25552 Pain in left hip: Secondary | ICD-10-CM

## 2017-01-01 NOTE — Telephone Encounter (Signed)
Pt is requesting refill on tramadol 50mg . Please advise.

## 2017-01-12 NOTE — Progress Notes (Addendum)
[facility] [address], [place], [place] [address] [phone]  Date:  01/13/2017   Name:  Crystal Kerr   DOB:  01/10/1925   MRN:  [id number]  PCP:  [name], MD    Chief Complaint: Abdominal Cramping (c/o two episodes of sharp abd cramping,in the last 2 weeks. Pt states that after each cramping episode she had very large bowl movements that made her feel weak. )   History of Present Illness:  Crystal Kerr is a 84 y.o. very pleasant female patient who presents with the following:  Here today with concern of stomach symptoms History of a fib on anticoagulation; she takes eliquis and amiodarone  She sees cardiology and pulmonology  I last saw her in June after an admission for pneumonia  Pt has noted a couple of episodes of severe stomach cramps recently This first occured about 2 weeks ago- she had severe cramps and pain for about 15 mintues, and then had a BM About a week ago it happened again No nausea or vomiting  She does feel gassy Right now her belly feels fine She is eating ok-"too much junk for Christmas."  No fever No sick exposure Her bladder and urination seem to be ok   She does not have a gallbladder or appendix   She has not had any lung issues recently  No recent medication change She had been using miralax some- however this never seemed to give her any problems Her ankles are good- not using lasix at all   She had surgery for bladder cancer on 5/2- she elected not to do do chemo TURBT performed on May 2 Since then she notes that her bladder feels more sensitive but there is no acute change  Pulse Readings from Last 3 Encounters:  01/13/17 (!) 55  09/24/16 (!) 56  08/26/16 (!) 57   BP Readings from Last 3 Encounters:  01/13/17 (!) 142/64  09/24/16 (!) 164/76  08/26/16 (!) 145/70     Patient Active Problem List   Diagnosis Date Noted  . Current use  of long term anticoagulation 07/31/2016  . Atypical pneumonia 06/02/2016  . AF (paroxysmal atrial fibrillation) ([place]) 06/02/2016  . Lower extremity edema 12/19/2013  . 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 Benign paroxysmal positional vertigo 09/13/2013  . Upper airway cough syndrome 07/10/2013  . Allergic rhinitis 07/05/2013  . Persistent atrial fibrillation ([place]) 03/30/2013  . Shortness of breath 02/06/2013  . Acute on chronic diastolic congestive heart failure ([place]) 02/04/2013  . Arthritis of spine 07/01/2012  . Intrinsic asthma 07/01/2012  . Dyspnea 04/12/2012  . PVC's (premature ventricular contractions) 04/24/2011  . Murmur, cardiac 04/24/2011    Past Medical History:  Diagnosis Date  . Arthritis   . Asthma   . Atrial fibrillation ([place])    a. s/p DCCV in 2015, on Amiodarone and Eliquis  . PVC's (premature ventricular contractions)     Past Surgical History:  Procedure Laterality Date  . APPENDECTOMY    . CARDIOVERSION N/A 03/08/2013   Procedure: CARDIOVERSION;  Surgeon: Larey Dresser, MD;  Location: [facility];  Service: Cardiovascular;  Laterality: N/A;  . CHOLECYSTECTOMY    . oophrectomy    . TEE WITHOUT CARDIOVERSION N/A 03/08/2013   Procedure: TRANSESOPHAGEAL ECHOCARDIOGRAM (TEE);  Surgeon: Larey Dresser, MD;  Location: [facility];  Service: Cardiovascular;  Laterality:  N/A;  . TONSILLECTOMY      Social History   Tobacco Use  . Smoking status: Former Smoker    Packs/day: 1.00    Years: 20.00    Pack years: 20.00    Types: Cigarettes    Last attempt to quit: 04/13/1981    Years since quitting: 35.7  . Smokeless tobacco: Never Used  Substance Use Topics  . Alcohol use: Yes    Comment: Occasional  . Drug use: No    Family History  Problem Relation Age of Onset  . Heart failure Mother   . Diabetes Father   . Heart disease Unknown        No family history    No Known  Allergies  Medication list has been reviewed and updated.  Current Outpatient Medications on File Prior to Visit  Medication Sig Dispense Refill  . acetaminophen (TYLENOL) 325 MG tablet Take 2 tablets (650 mg total) by mouth every 6 (six) hours as needed for mild pain or headache. 30 tablet 0  . albuterol (PROAIR HFA) 108 (90 Base) MCG/ACT inhaler Inhale 2 puffs into the lungs every 4 (four) hours as needed for wheezing or shortness of breath. 18 g 5  . amiodarone (PACERONE) 200 MG tablet TAKE 1 TABLET BY MOUTH DAILY 90 tablet 2  . apixaban (ELIQUIS) 5 MG TABS tablet Take 1 tablet (5 mg total) by mouth 2 (two) times daily. 60 tablet 5  . diltiazem (TIAZAC) 120 MG 24 hr capsule TAKE 1 CAPSULE BY MOUTH DAILY 90 capsule 3  . famotidine (PEPCID) 20 MG tablet Take 1 tablet (20 mg total) by mouth at bedtime. 90 tablet 1  . montelukast (SINGULAIR) 10 MG tablet Take 1 tablet (10 mg total) by mouth daily. 90 tablet 0  . Multiple Vitamins-Minerals (MULTIVITAMIN WITH MINERALS) tablet Take 1 tablet by mouth daily.    . pantoprazole (PROTONIX) 40 MG tablet Take 1 tablet (40 mg total) by mouth daily before breakfast. 90 tablet 3  . polyethylene glycol (MIRALAX / GLYCOLAX) packet Take 17 g by mouth daily as needed.    . SYMBICORT 160-4.5 MCG/ACT inhaler INHALE 2 PUFFS FIRST THING IN THE MORNING, AND 2 MORE IN 12 HOURS 10.2 g 12  . traMADol (ULTRAM) 50 MG tablet TAKE 1/2 TO 1 TABLET BY MOUTH EVERY 8 HOURS AS NEEDED FOR HIP PAIN 60 tablet 0  . triamcinolone cream (KENALOG) 0.1 % Apply 1 application topically 2 (two) times daily. Apply to rash on side as needed 30 g 1  . furosemide (LASIX) 20 MG tablet Take 1 tablet (20 mg total) by mouth every other day. (Patient taking differently: Take 20 mg by mouth as needed. ) 30 tablet 0   No current facility-administered medications on file prior to visit.     Review of Systems:  As per HPI- otherwise negative.   Physical Examination: Vitals:   01/13/17 1322   BP: (!) 142/64  Pulse: (!) 55  Temp: (!) 97.4 F (36.3 C)  SpO2: 98%   Vitals:   01/13/17 1322  Weight: 172 lb 9.6 oz (78.3 kg)   Body mass index is 31.57 kg/m. Ideal Body Weight:    GEN: WDWN, NAD, Non-toxic, A & O x 3, obese, looks well HEENT: Atraumatic, Normocephalic. Neck supple. No masses, No LAD. Ears and Nose: No external deformity. CV: RRR, No M/G/R. No JVD. No thrill. No extra heart sounds. PULM: CTA B, no wheezes, crackles, rhonchi. No retractions. No resp. distress. No accessory muscle use.  ABD: S, NT, ND, +BS. No rebound. No HSM.  Benign belly at my exam today EXTR: No c/c/e NEURO sitting in wc today as it is a long walk to my clinic, but states that she does fine for shorter distances  PSYCH: Normally interactive. Conversant. Not depressed or anxious appearing.  Calm demeanor.   BP Readings from Last 3 Encounters:  01/13/17 (!) 142/64  09/24/16 (!) 164/76  08/26/16 (!) 145/70   Pulse Readings from Last 3 Encounters:  01/13/17 (!) 55  09/24/16 (!) 56  08/26/16 (!) 57    Assessment and Plan: Abdominal cramping - Plan: CBC, Comprehensive metabolic panel, DG Abd Acute W/Chest, dicyclomine (BENTYL) 10 MG capsule  Here today with 2 recent discrete episodes of abdominal cramping and pain, self resolved.  Pt reports that her sx can be quite severe when she has the episodes.  Cause is not immediately apparent Will obtain labs and x-rays as above today Also gave her an rx for bentyl to have on hand- no history of glaucoma Cautioned her however that is severe sx return she should seek care right away  Dg Abd Acute W/chest  Result Date: 01/13/2017 CLINICAL DATA:  Patient c/o abdominal cramping, bloating and gas with constipation x few days, no other complaints EXAM: DG ABDOMEN ACUTE W/ 1V CHEST COMPARISON:  Chest radiograph 06/24/2016 FINDINGS: Normal cardiac silhouette. No effusion, infiltrate pneumothorax. Chronic interstitial markings noted. No dilated to large or  small bowel. Moderate volume stool throughout the colon. No pathologic calcifications IMPRESSION: 1. No acute cardiopulmonary findings. 2. Chronic mild interstitial lung disease. 3. Large volume stool throughout the colon. No evidence obstruction. Electronically Signed   By: Suzy Bouchard M.D.   On: 01/13/2017 15:17   Results for orders placed or performed in visit on 01/13/17  CBC  Result Value Ref Range   WBC 8.5 4.0 - 10.5 K/uL   RBC 4.05 3.87 - 5.11 Mil/uL   Platelets 243.0 150.0 - 400.0 K/uL   Hemoglobin 10.0 (L) 12.0 - 15.0 g/dL   HCT 31.7 (L) 36.0 - 46.0 %   MCV 78.4 78.0 - 100.0 fl   MCHC 31.6 30.0 - 36.0 g/dL   RDW 17.1 (H) 11.5 - 15.5 %  Comprehensive metabolic panel  Result Value Ref Range   Sodium 136 135 - 145 mEq/L   Potassium 4.1 3.5 - 5.1 mEq/L   Chloride 101 96 - 112 mEq/L   CO2 28 19 - 32 mEq/L   Glucose, Bld 110 (H) 70 - 99 mg/dL   BUN 30 (H) 6 - 23 mg/dL   Creatinine, Ser 1.13 0.40 - 1.20 mg/dL   Total Bilirubin 0.3 0.2 - 1.2 mg/dL   Alkaline Phosphatase 94 39 - 117 U/L   AST 27 0 - 37 U/L   ALT 21 0 - 35 U/L   Total Protein 6.2 6.0 - 8.3 g/dL   Albumin 3.6 3.5 - 5.2 g/dL   Calcium 8.6 8.4 - 10.5 mg/dL   GFR 48.06 (L) >60.00 mL/min    Signed Lamar Blinks, MD  Message to pt No apparent explanation for your symptoms.  However, you do have a lot of stool in the colon.  A stool softener may be helpful in getting this moving without causing cramping or diarrhea  Called Crystal Kerr to check on her mom on 1/4- LMOM Called again on 1/11[facility] with [facility]. Will send out her lab letter Will send IFOB cards as well

## 2017-01-13 ENCOUNTER — Ambulatory Visit (HOSPITAL_BASED_OUTPATIENT_CLINIC_OR_DEPARTMENT_OTHER)
Admission: RE | Admit: 2017-01-13 | Discharge: 2017-01-13 | Disposition: A | Discharge status: Home / Self Care | Payer: Medicare Other | Source: Ambulatory Visit | Attending: Family Medicine | Admitting: Family Medicine

## 2017-01-13 ENCOUNTER — Encounter: Payer: Self-pay | Admitting: Family Medicine

## 2017-01-13 ENCOUNTER — Ambulatory Visit (INDEPENDENT_AMBULATORY_CARE_PROVIDER_SITE_OTHER): Payer: Medicare Other | Admitting: Family Medicine

## 2017-01-13 VITALS — BP 142/64 | HR 55 | Temp 97.4°F | Wt 172.6 lb

## 2017-01-13 DIAGNOSIS — J849 Interstitial pulmonary disease, unspecified: Secondary | ICD-10-CM | POA: Insufficient documentation

## 2017-01-13 DIAGNOSIS — R109 Unspecified abdominal pain: Secondary | ICD-10-CM

## 2017-01-13 DIAGNOSIS — R140 Abdominal distension (gaseous): Secondary | ICD-10-CM | POA: Diagnosis not present

## 2017-01-13 DIAGNOSIS — K5900 Constipation, unspecified: Secondary | ICD-10-CM | POA: Insufficient documentation

## 2017-01-13 LAB — CBC
HCT: 31.7 % — ABNORMAL LOW (ref 36.0–46.0)
HEMOGLOBIN: 10 g/dL — AB (ref 12.0–15.0)
MCHC: 31.6 g/dL (ref 30.0–36.0)
MCV: 78.4 fl (ref 78.0–100.0)
PLATELETS: 243 10*3/uL (ref 150.0–400.0)
RBC: 4.05 Mil/uL (ref 3.87–5.11)
RDW: 17.1 % — ABNORMAL HIGH (ref 11.5–15.5)
WBC: 8.5 10*3/uL (ref 4.0–10.5)

## 2017-01-13 LAB — COMPREHENSIVE METABOLIC PANEL
ALBUMIN: 3.6 g/dL (ref 3.5–5.2)
ALK PHOS: 94 U/L (ref 39–117)
ALT: 21 U/L (ref 0–35)
AST: 27 U/L (ref 0–37)
BILIRUBIN TOTAL: 0.3 mg/dL (ref 0.2–1.2)
BUN: 30 mg/dL — ABNORMAL HIGH (ref 6–23)
CALCIUM: 8.6 mg/dL (ref 8.4–10.5)
CO2: 28 mEq/L (ref 19–32)
Chloride: 101 mEq/L (ref 96–112)
Creatinine, Ser: 1.13 mg/dL (ref 0.40–1.20)
GFR: 48.06 mL/min — AB (ref >60.00)
Glucose, Bld: 110 mg/dL — ABNORMAL HIGH (ref 70–99)
Potassium: 4.1 mEq/L (ref 3.5–5.1)
Sodium: 136 mEq/L (ref 135–145)
Total Protein: 6.2 g/dL (ref 6.0–8.3)

## 2017-01-13 MED ORDER — DICYCLOMINE HCL 10 MG PO CAPS: 10.0000 mg | ORAL_CAPSULE | Freq: Three times a day (TID) | ORAL | 1 refills | Status: DC

## 2017-01-13 NOTE — Patient Instructions (Signed)
We will get some labs and x-rays of your belly today I will be in touch with these results asap Please keep the bentyl medication on hand and use if needed However if you have any more episodes please let me know!

## 2017-01-29 ENCOUNTER — Telehealth: Payer: Self-pay | Admitting: Family Medicine

## 2017-01-29 NOTE — Telephone Encounter (Signed)
Copied from [place] [id number]. Topic: Quick Communication - Rx Refill/Question >> Jan 29, 2017 10:02 AM Cleaster Corin, NT wrote: Medication: albuterol   Has the patient contacted their pharmacy?yes (no longer on pharmacy med. List for pt.  (Agent: If no, request that the patient contact the pharmacy for the refill.)   Preferred Pharmacy (with phone number or street name): [facility] - [place], [place] - [address] AT [place] [address] [place] [address] Phone: [phone] Fax: [phone]     Agent: Please be advised that RX refills may take up to 3 business days. We ask that you follow-up with your pharmacy.

## 2017-02-04 ENCOUNTER — Other Ambulatory Visit: Payer: Self-pay | Admitting: Emergency Medicine

## 2017-02-04 DIAGNOSIS — J4541 Moderate persistent asthma with (acute) exacerbation: Secondary | ICD-10-CM

## 2017-02-04 MED ORDER — ALBUTEROL SULFATE HFA 108 (90 BASE) MCG/ACT IN AERS: 2.0000 | INHALATION_SPRAY | RESPIRATORY_TRACT | 5 refills | Status: DC | PRN

## 2017-02-04 NOTE — Telephone Encounter (Signed)
Refill sent per pt request.  

## 2017-03-02 NOTE — Progress Notes (Signed)
HPI: FU atrial fibrillation. Patient had episode of atrial fibrillation in February 2015 in the setting of viral gastroenteritis. She had a TEE guided cardioversion and has maintained sinus rhythm on amiodarone. TEE February 2015showed normal LV function, lambl'sexcrescence, mild to moderate left atrial enlargement. Since she was last seen  the patient notes increased dyspnea on exertion.  There is no orthopnea, PND, pedal edema or chest pain.  She has a chronic mildly productive cough.  No fevers, chills or hemoptysis.  Current Outpatient Medications  Medication Sig Dispense Refill  . acetaminophen (TYLENOL) 325 MG tablet Take 2 tablets (650 mg total) by mouth every 6 (six) hours as needed for mild pain or headache. 30 tablet 0  . albuterol (PROAIR HFA) 108 (90 Base) MCG/ACT inhaler Inhale 2 puffs into the lungs every 4 (four) hours as needed for wheezing or shortness of breath. 18 g 5  . amiodarone (PACERONE) 200 MG tablet TAKE 1 TABLET BY MOUTH DAILY 90 tablet 2  . apixaban (ELIQUIS) 5 MG TABS tablet Take 1 tablet (5 mg total) by mouth 2 (two) times daily. 60 tablet 5  . diltiazem (TIAZAC) 120 MG 24 hr capsule TAKE 1 CAPSULE BY MOUTH DAILY 90 capsule 3  . famotidine (PEPCID) 20 MG tablet Take 1 tablet (20 mg total) by mouth at bedtime. 90 tablet 1  . furosemide (LASIX) 20 MG tablet Take 20 mg by mouth as needed.    . montelukast (SINGULAIR) 10 MG tablet Take 1 tablet (10 mg total) by mouth daily. 90 tablet 0  . Multiple Vitamins-Minerals (MULTIVITAMIN WITH MINERALS) tablet Take 1 tablet by mouth daily.    . pantoprazole (PROTONIX) 40 MG tablet Take 1 tablet (40 mg total) by mouth daily before breakfast. 90 tablet 3  . polyethylene glycol (MIRALAX / GLYCOLAX) packet Take 17 g by mouth daily as needed.    . SYMBICORT 160-4.5 MCG/ACT inhaler INHALE 2 PUFFS FIRST THING IN THE MORNING, AND 2 MORE IN 12 HOURS 10.2 g 12  . traMADol (ULTRAM) 50 MG tablet TAKE 1/2 TO 1 TABLET BY MOUTH EVERY 8  HOURS AS NEEDED FOR HIP PAIN 60 tablet 0  . triamcinolone cream (KENALOG) 0.1 % Apply 1 application topically 2 (two) times daily. Apply to rash on side as needed 30 g 1   No current facility-administered medications for this visit.      Past Medical History:  Diagnosis Date  . Arthritis   . Asthma   . Atrial fibrillation ([place])    a. s/p DCCV in 2015, on Amiodarone and Eliquis  . PVC's (premature ventricular contractions)     Past Surgical History:  Procedure Laterality Date  . APPENDECTOMY    . CARDIOVERSION N/A 03/08/2013   Procedure: CARDIOVERSION;  Surgeon: Larey Dresser, MD;  Location: [facility];  Service: Cardiovascular;  Laterality: N/A;  . CHOLECYSTECTOMY    . oophrectomy    . TEE WITHOUT CARDIOVERSION N/A 03/08/2013   Procedure: TRANSESOPHAGEAL ECHOCARDIOGRAM (TEE);  Surgeon: Larey Dresser, MD;  Location: [facility];  Service: Cardiovascular;  Laterality: N/A;  . TONSILLECTOMY    . 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 Marital status: Unknown    Spouse name: Not on file  . Number of children: Not on file  . Years of education: Not on file  . Highest education level: Not on file  Social Needs  .  Financial resource strain: Not on file  . Food insecurity - worry: Not on file  . Food insecurity - inability: Not on file  . Transportation needs - medical: Not on file  . Transportation needs - non-medical: Not on file  Occupational History  . Occupation: Retired    Fish farm manager: RETIRED  Tobacco Use  . Smoking status: Former Smoker    Packs/day: 1.00    Years: 20.00    Pack years: 20.00    Types: Cigarettes    Last attempt to quit: 04/13/1981    Years since quitting: 35.9  . Smokeless tobacco: Never Used  Substance and Sexual Activity  . Alcohol use: Yes    Comment: Occasional  . Drug use: No  . Sexual activity: No  Other Topics Concern  . Not on  file  Social History Narrative  . Not on file    Family History  Problem Relation Age of Onset  . Heart failure Mother   . Diabetes Father   . Heart disease Unknown        No family history    ROS: no fevers or chills, productive cough, hemoptysis, dysphasia, odynophagia, melena, hematochezia, dysuria, hematuria, rash, seizure activity, orthopnea, PND, pedal edema, claudication. Remaining systems are negative.  Physical Exam: Well-developed obese in no acute distress.  Skin is warm and dry.  HEENT is normal.  Neck is supple.  Chest shows mildly diminished breath sounds at bases Cardiovascular exam is regular rate and rhythm.  Abdominal exam nontender or distended. No masses palpated. Extremities show no edema. neuro grossly intact  ECG-normal sinus rhythm, PVC, first-degree AV block.  Personally reviewed  A/P  1 paroxysmal atrial fibrillation-patient remains in sinus rhythm today.  Continue amiodarone and apixaban.  Check chest x-ray, liver functions, TSH, hemoglobin and renal function.  2 hypertension-blood pressure is elevated.  I have asked her to track this at home and we will advance regimen as needed.  3 chronic diastolic congestive heart failure-patient complains of dyspnea; she appears to be euvolemic on examination.  Continue lasix as needed.  Check BNP.  If elevated we will ask her to take Lasix daily.  We again discussed the importance of fluid restriction to 1.5 L daily and low-sodium diet.  Check potassium and renal function.  4 dyspnea-etiology unclear.  She does not appear to be markedly volume overloaded on examination.  Check BNP.  Question pleural effusions on examination.  Check chest x-ray.  I will also check sed rate.  Clinically she does not have ongoing infection.  I will see her back in 2 weeks to make sure she is stable.   Addendum chest x-ray shows patchy airspace disease concerning for pneumonia.  I will ask primary care to evaluate the patient today.   Unclear to me whether amiodarone could be contributing.  We may need to discontinue but will await evaluation by primary care.  Kirk Ruths, MD

## 2017-03-03 ENCOUNTER — Ambulatory Visit (HOSPITAL_BASED_OUTPATIENT_CLINIC_OR_DEPARTMENT_OTHER)
Admission: RE | Admit: 2017-03-03 | Discharge: 2017-03-03 | Disposition: A | Discharge status: Home / Self Care | Payer: Medicare Other | Source: Ambulatory Visit | Attending: Cardiology | Admitting: Cardiology

## 2017-03-03 ENCOUNTER — Encounter: Payer: Self-pay | Admitting: Cardiology

## 2017-03-03 ENCOUNTER — Telehealth: Payer: Self-pay | Admitting: *Deleted

## 2017-03-03 ENCOUNTER — Ambulatory Visit (INDEPENDENT_AMBULATORY_CARE_PROVIDER_SITE_OTHER): Payer: Medicare Other | Admitting: Cardiology

## 2017-03-03 VITALS — BP 153/68 | HR 66 | Ht 62.0 in | Wt 167.4 lb

## 2017-03-03 DIAGNOSIS — I480 Paroxysmal atrial fibrillation: Secondary | ICD-10-CM | POA: Diagnosis not present

## 2017-03-03 DIAGNOSIS — J449 Chronic obstructive pulmonary disease, unspecified: Secondary | ICD-10-CM | POA: Insufficient documentation

## 2017-03-03 DIAGNOSIS — R0600 Dyspnea, unspecified: Secondary | ICD-10-CM | POA: Insufficient documentation

## 2017-03-03 DIAGNOSIS — J189 Pneumonia, unspecified organism: Secondary | ICD-10-CM

## 2017-03-03 DIAGNOSIS — R918 Other nonspecific abnormal finding of lung field: Secondary | ICD-10-CM | POA: Diagnosis not present

## 2017-03-03 DIAGNOSIS — R0602 Shortness of breath: Secondary | ICD-10-CM | POA: Diagnosis not present

## 2017-03-03 DIAGNOSIS — I10 Essential (primary) hypertension: Secondary | ICD-10-CM

## 2017-03-03 MED ORDER — CEFDINIR 300 MG PO CAPS: 300.0000 mg | ORAL_CAPSULE | Freq: Two times a day (BID) | ORAL | 0 refills | Status: DC

## 2017-03-03 NOTE — Telephone Encounter (Signed)
Reviewed CXR from today  Dg Chest 2 View  Result Date: 03/03/2017 CLINICAL DATA:  Shortness of Breath for 3 weeks EXAM: CHEST  2 VIEW COMPARISON:  01/13/2017 FINDINGS: There is hyperinflation of the lungs compatible with COPD. Patchy airspace opacities in both lower lobes and the left upper lobe have increased since prior study and could reflect areas of pneumonia. Heart is normal size. No effusions or acute bony abnormality. IMPRESSION: COPD. Patchy airspace opacities in both lungs concerning for multifocal pneumonia. Electronically Signed   By: Rolm Baptise M.D.   On: 03/03/2017 10:49   Omnicef would be a good agent for her.  Avoid quinolones as she is on amiodarone  Called and spoke with [name], her daughter-  She has a productive cough, no fever.  She has been sick for a couple of weeks Will start on omnicef today, and she has an appt to be seen tomorrow. Her appt is with Percell Miller but I am here as well if they need me

## 2017-03-03 NOTE — Telephone Encounter (Signed)
Received call from the [facility] stating pt is at Dr Jacalyn Lefevre office and pt's CXR today shows pneumonia and he would like her seen today.  There are no appointment availabilities in our office today and offered to have pt seen in another [facility] site today. PEC states she will discuss with cardiology and I did not need to hold for outcome. Notified PCP and she states she would review CXR and send antibiotic for pt to start.

## 2017-03-03 NOTE — Telephone Encounter (Signed)
Spoke with pt dtr, aware I have spoken with dr copeland's office and they are not able to see the patient today. She is scheduled to see the PA in their office tomorrow @ 10 am. Dr Edilia Bo is going to look over the cxr results and send antibiotics for the patient. I explained to the dtr if the patient is not seen by dr copeland's office tomorrow, dr [name] would like her to be seen in one week. Patient dtr voiced understanding and will wait for dr copeland's office to contact them.

## 2017-03-03 NOTE — Patient Instructions (Signed)
Medication Instructions:  Your physician recommends that you continue on your current medications as directed. Please refer to the Current Medication list given to you today.   Labwork: Your physician recommends that you have return for lab work today   Testing/Procedures: A chest x-ray takes a picture of the organs and structures inside the chest, including the heart, lungs, and blood vessels. This test can show several things, including, whether the heart is enlarges; whether fluid is building up in the lungs; and whether pacemaker / defibrillator leads are still in place.   Follow-Up: Your physician recommends that you keep your scheduled a follow-up appointment with [name].  Any Other Special Instructions Will Be Listed Below (If Applicable).     If you need a refill on your cardiac medications before your next appointment, please call your pharmacy.

## 2017-03-03 NOTE — Telephone Encounter (Signed)
Dr [name]--  I spoke with Dr Jacalyn Lefevre nurse. Pt is not aware of the results yet and cardiology will notify pt of result and that we will be contacting her about further instructions. They went ahead and scheduled pt visit with Percell Miller for tomorrow.  Do we cancel that if you send Rx and when should she follow up in regards to xray finding?

## 2017-03-03 NOTE — Telephone Encounter (Signed)
-----   Message from Lelon Perla, MD sent at 03/03/2017 11:12 AM EST ----- Will ask primary care to see. Kirk Ruths

## 2017-03-04 ENCOUNTER — Ambulatory Visit (HOSPITAL_BASED_OUTPATIENT_CLINIC_OR_DEPARTMENT_OTHER)
Admission: RE | Admit: 2017-03-04 | Discharge: 2017-03-04 | Disposition: A | Discharge status: Home / Self Care | Payer: Medicare Other | Source: Ambulatory Visit | Attending: Cardiology | Admitting: Cardiology

## 2017-03-04 ENCOUNTER — Ambulatory Visit (INDEPENDENT_AMBULATORY_CARE_PROVIDER_SITE_OTHER): Payer: Medicare Other | Admitting: Adult Health

## 2017-03-04 ENCOUNTER — Telehealth: Payer: Self-pay | Admitting: Adult Health

## 2017-03-04 ENCOUNTER — Telehealth: Payer: Self-pay | Admitting: *Deleted

## 2017-03-04 ENCOUNTER — Ambulatory Visit (INDEPENDENT_AMBULATORY_CARE_PROVIDER_SITE_OTHER): Payer: Medicare Other | Admitting: Medical

## 2017-03-04 ENCOUNTER — Encounter: Payer: Self-pay | Admitting: Medical

## 2017-03-04 ENCOUNTER — Encounter: Payer: Self-pay | Admitting: Adult Health

## 2017-03-04 VITALS — BP 144/53 | HR 59 | Temp 97.4°F | Resp 16 | Ht 62.0 in | Wt 169.4 lb

## 2017-03-04 DIAGNOSIS — R590 Localized enlarged lymph nodes: Secondary | ICD-10-CM | POA: Insufficient documentation

## 2017-03-04 DIAGNOSIS — J189 Pneumonia, unspecified organism: Secondary | ICD-10-CM

## 2017-03-04 DIAGNOSIS — I700 Atherosclerosis of aorta: Secondary | ICD-10-CM | POA: Insufficient documentation

## 2017-03-04 DIAGNOSIS — J849 Interstitial pulmonary disease, unspecified: Secondary | ICD-10-CM | POA: Insufficient documentation

## 2017-03-04 DIAGNOSIS — J45909 Unspecified asthma, uncomplicated: Secondary | ICD-10-CM | POA: Diagnosis not present

## 2017-03-04 DIAGNOSIS — R918 Other nonspecific abnormal finding of lung field: Secondary | ICD-10-CM | POA: Insufficient documentation

## 2017-03-04 DIAGNOSIS — R05 Cough: Secondary | ICD-10-CM

## 2017-03-04 DIAGNOSIS — I2510 Atherosclerotic heart disease of native coronary artery without angina pectoris: Secondary | ICD-10-CM | POA: Insufficient documentation

## 2017-03-04 DIAGNOSIS — R059 Cough, unspecified: Secondary | ICD-10-CM

## 2017-03-04 LAB — CBC WITH DIFFERENTIAL/PLATELET
Basophils Absolute: 0 10*3/uL (ref 0.0–0.2)
Basos: 0 %
EOS (ABSOLUTE): 0.3 10*3/uL (ref 0.0–0.4)
Eos: 2 %
Hematocrit: 33.3 % — ABNORMAL LOW (ref 34.0–46.6)
Hemoglobin: 10.2 g/dL — ABNORMAL LOW (ref 11.1–15.9)
Immature Grans (Abs): 0 10*3/uL (ref 0.0–0.1)
Immature Granulocytes: 0 %
Lymphocytes Absolute: 1.7 10*3/uL (ref 0.7–3.1)
Lymphs: 12 %
MCH: 23.7 pg — ABNORMAL LOW (ref 26.6–33.0)
MCHC: 30.6 g/dL — ABNORMAL LOW (ref 31.5–35.7)
MCV: 77 fL — ABNORMAL LOW (ref 79–97)
Monocytes Absolute: 1.7 10*3/uL — ABNORMAL HIGH (ref 0.1–0.9)
Monocytes: 12 %
Neutrophils Absolute: 10.1 10*3/uL — ABNORMAL HIGH (ref 1.4–7.0)
Neutrophils: 74 %
Platelets: 467 10*3/uL — ABNORMAL HIGH (ref 150–379)
RBC: 4.3 x10E6/uL (ref 3.77–5.28)
RDW: 16.7 % — ABNORMAL HIGH (ref 12.3–15.4)
WBC: 13.8 10*3/uL — ABNORMAL HIGH (ref 3.4–10.8)

## 2017-03-04 LAB — BASIC METABOLIC PANEL WITH GFR
BUN/Creatinine Ratio: 17 (ref 12–28)
BUN: 19 mg/dL (ref 10–36)
CO2: 22 mmol/L (ref 20–29)
Calcium: 9.1 mg/dL (ref 8.7–10.3)
Chloride: 102 mmol/L (ref 96–106)
Creatinine, Ser: 1.12 mg/dL — ABNORMAL HIGH (ref 0.57–1.00)
GFR calc Af Amer: 50 mL/min/{1.73_m2} — ABNORMAL LOW
GFR calc non Af Amer: 43 mL/min/{1.73_m2} — ABNORMAL LOW
Glucose: 95 mg/dL (ref 65–99)
Potassium: 4.5 mmol/L (ref 3.5–5.2)
Sodium: 141 mmol/L (ref 134–144)

## 2017-03-04 LAB — TSH: TSH: 1.44 u[IU]/mL (ref 0.450–4.500)

## 2017-03-04 LAB — HEPATIC FUNCTION PANEL
ALT: 26 [IU]/L (ref 0–32)
AST: 30 [IU]/L (ref 0–40)
Albumin: 3.6 g/dL (ref 3.2–4.6)
Alkaline Phosphatase: 105 [IU]/L (ref 39–117)
Bilirubin Total: 0.4 mg/dL (ref 0.0–1.2)
Bilirubin, Direct: 0.18 mg/dL (ref 0.00–0.40)
Total Protein: 6.4 g/dL (ref 6.0–8.5)

## 2017-03-04 LAB — SEDIMENTATION RATE: Sed Rate: 99 mm/h — ABNORMAL HIGH (ref 0–40)

## 2017-03-04 MED ORDER — AZITHROMYCIN 250 MG PO TABS: 250.0000 mg | ORAL_TABLET | Freq: Every day | ORAL | 0 refills | Status: DC

## 2017-03-04 NOTE — Patient Instructions (Addendum)
[employer] as directed.  Add Azithromycin daily for 5 days .  Mucinex DM Twice daily  As needed  Cough/congestion  Continue on Symbicort Twice daily  .  Hold Amiodarone for now .  Follow up in week with chest xray .  Please contact office for sooner follow up if symptoms do not improve or worsen or seek emergency care

## 2017-03-04 NOTE — Progress Notes (Signed)
Subjective:    Patient ID: Crystal Kerr, female    DOB: Oct 10, 1924, 84 y.o.   MRN: [id number]  HPI  Pt in for some recent sob and wheezing for 2 weeks.. Due to these symptoms she went to cardiologist yesterday. They told her heart not the cause but told her that copd and pneumonia likely the cause. Pt has CT at 1:30 today here. Also later this afternoon at 2:15 seeing pulmonologist. Dr Elsworth Soho or PA/NP.   Chest x-ray showed probable multifocal pneumonia yesterday.  In addition her WBC count was elevated above 13.  Cardiologist contacted [name] per daughter and she was placed on cefdnir. Pt had 3 doses of the antibiotic.  Pt states she feels about the same as yesterday. She states not worse.   Pt has no wet cough per daughter(has dry hacky cough) but has dry cough in past.. No fever, no chills or sweats.   Pt wbc elevated at 13.She is very sob recently. Difficulty ambulating even short distance.  Appointment was made with me today.  At that time appointment was made with me the pulmonologist appointment was not made.  Review of Systems  Constitutional: Positive for fatigue. Negative for chills and fever.  HENT: Positive for congestion.   Respiratory: Positive for cough. Negative for chest tightness, shortness of breath and wheezing.   Cardiovascular: Negative for chest pain and palpitations.  Gastrointestinal: Negative for abdominal pain.  Neurological: Negative for dizziness, tremors, seizures, weakness and headaches.  Hematological: Negative for adenopathy. Does not bruise/bleed easily.  Psychiatric/Behavioral: Negative for behavioral problems and confusion.   Past Medical History:  Diagnosis Date  . Arthritis   . Asthma   . Atrial fibrillation ([place])    a. s/p DCCV in 2015, on Amiodarone and Eliquis  . PVC's (premature ventricular contractions)      Social History   Socioeconomic History  . Marital status: Unknown    Spouse name: Not on file  . Number of  children: Not on file  . Years of education: Not on file  . Highest education level: Not on file  Social Needs  . Financial resource strain: Not on file  . Food insecurity - worry: Not on file  . Food insecurity - inability: Not on file  . Transportation needs - medical: Not on file  . Transportation needs - non-medical: Not on file  Occupational History  . Occupation: Retired    Fish farm manager: RETIRED  Tobacco Use  . Smoking status: Former Smoker    Packs/day: 1.00    Years: 20.00    Pack years: 20.00    Types: Cigarettes    Last attempt to quit: 04/13/1981    Years since quitting: 35.9  . Smokeless tobacco: Never Used  Substance and Sexual Activity  . Alcohol use: Yes    Comment: Occasional  . Drug use: No  . Sexual activity: No  Other Topics Concern  . Not on file  Social History Narrative  . Not on file    Past Surgical History:  Procedure Laterality Date  . APPENDECTOMY    . CARDIOVERSION N/A 03/08/2013   Procedure: CARDIOVERSION;  Surgeon: Larey Dresser, MD;  Location: [facility];  Service: Cardiovascular;  Laterality: N/A;  . CHOLECYSTECTOMY    . oophrectomy    . TEE WITHOUT CARDIOVERSION N/A 03/08/2013   Procedure: TRANSESOPHAGEAL ECHOCARDIOGRAM (TEE);  Surgeon: Larey Dresser, MD;  Location: [facility];  Service: Cardiovascular;  Laterality: N/A;  . TONSILLECTOMY    . 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 Heart failure Mother   . Diabetes Father   . Heart disease Unknown        No family history    No Known Allergies  Current Outpatient Medications on File Prior to Visit  Medication Sig Dispense Refill  . acetaminophen (TYLENOL) 325 MG tablet Take 2 tablets (650 mg total) by mouth every 6 (six) hours as needed for mild pain or headache. 30 tablet 0  . albuterol (PROAIR HFA) 108 (90 Base) MCG/ACT inhaler Inhale 2 puffs into the lungs every 4  (four) hours as needed for wheezing or shortness of breath. 18 g 5  . amiodarone (PACERONE) 200 MG tablet TAKE 1 TABLET BY MOUTH DAILY 90 tablet 2  . apixaban (ELIQUIS) 5 MG TABS tablet Take 1 tablet (5 mg total) by mouth 2 (two) times daily. 60 tablet 5  . cefdinir (OMNICEF) 300 MG capsule Take 1 capsule (300 mg total) by mouth 2 (two) times daily. 20 capsule 0  . diltiazem (TIAZAC) 120 MG 24 hr capsule TAKE 1 CAPSULE BY MOUTH DAILY 90 capsule 3  . famotidine (PEPCID) 20 MG tablet Take 1 tablet (20 mg total) by mouth at bedtime. 90 tablet 1  . furosemide (LASIX) 20 MG tablet Take 20 mg by mouth as needed.    . montelukast (SINGULAIR) 10 MG tablet Take 1 tablet (10 mg total) by mouth daily. 90 tablet 0  . Multiple Vitamins-Minerals (MULTIVITAMIN WITH MINERALS) tablet Take 1 tablet by mouth daily.    . pantoprazole (PROTONIX) 40 MG tablet Take 1 tablet (40 mg total) by mouth daily before breakfast. 90 tablet 3  . polyethylene glycol (MIRALAX / GLYCOLAX) packet Take 17 g by mouth daily as needed.    . SYMBICORT 160-4.5 MCG/ACT inhaler INHALE 2 PUFFS FIRST THING IN THE MORNING, AND 2 MORE IN 12 HOURS 10.2 g 12  . traMADol (ULTRAM) 50 MG tablet TAKE 1/2 TO 1 TABLET BY MOUTH EVERY 8 HOURS AS NEEDED FOR HIP PAIN 60 tablet 0  . triamcinolone cream (KENALOG) 0.1 % Apply 1 application topically 2 (two) times daily. Apply to rash on side as needed 30 g 1   No current facility-administered medications on file prior to visit.     BP (!) 144/53   Pulse (!) 59   Temp (!) 97.4 F (36.3 C) (Oral)   Resp 16   Ht 5\' 2"  (1.575 m)   Wt 169 lb 6.4 oz (76.8 kg)   SpO2 95%   BMI 30.98 kg/m       Objective:   Physical Exam  General- No acute distress. Pleasant patient. Neck- Full range of motion, no jvd Lungs- Clear, even and unlabored.(mid shallow but no wheezing or rough breath sounds) Heart- regular rate and rhythm. Neurologic- CNII- XII grossly intact.  Lower extremity-Symmetric.  No pedal edema.   Negative Homans sign.        Assessment & Plan:  You do have concerning findings for bilateral/multifocal pneumonia on chest x-ray and WBC levels are elevated.  In addition you are unable to ambulate well without significant shortness of breath recently.  You very well may need to be seen in the emergency department and/or need to be admitted.  I decided since you do have CT of your chest already set up afternoon and are going to see the pulmonologist that I will  defer decision to them.  I really think there is a strong likelihood that you may need to be admitted in the and due to your age and baseline lung status.  Follow-up with pulmonology as scheduled later today.  If anything changes dramatically during the interim before CT and pulmonology visit then be seen in the emergency department.  I am going to send a note to PCP and to pulmonologist.

## 2017-03-04 NOTE — Telephone Encounter (Signed)
Pt is calling back [phone]

## 2017-03-04 NOTE — Progress Notes (Signed)
_0  ID: Crystal Kerr, female    DOB: 06-20-1924, 84 y.o.   MRN: [id number]  Chief Complaint  Patient presents with  . Follow-up    Asthma     Referring provider: [name], MD  HPI: 84 year old female seen in May 2018 after a pneumonia complicated by rhinovirus.  CT chest showed groundglass opacities and clustered nodules consistent with a acute pneumonia larger nodule with possible area of cavitation PMH -Asthma   TEST  PFTs 03/20/14   FEV1  1.28 (85%) ratio 75 s resp to saba  and dlco 68 corrects to 103 CT chest Jun 02, 2016 showed groundglass opacities and clustered nodules compatible with an acute pneumonia.  There is suggestion of cavitation with a larger nodules although this is difficult to distinguish from the bronchiectatic changes. CT chest September 22, 2016 showed market improvement in the multifocal bilateral centrally  airspace disease on previous study.   03/04/2017 Acute OV : PNA  Patient presents for an acute office visit.  She complains over the last 3 weeks that she has had increased cough with congestion and shortness of breath.  Her shortness of breath with activity has been progressively worsening for the last week.  It was seen by cardiology yesterday with chest x-ray showing bilateral patchy airspace opacities.  Lab work showed a high sedimentation rate at 99.  White blood cell count was elevated at 13,000 with a left shift.  Patient says she does have a congested cough with thick mucus.  She was called in [place] for 10 days yesterday.  She is completed 3 doses.  She says she is tolerating well.  Patient says she is eating and drinking without difficulty.  She has no nausea vomiting or diarrhea.  Patient is followed by cardiology for atrial fib.  She is on diltiazem, amiodarone and Eliquis.  She has been in sinus rhythm since cardioversion in 2015.  Patient had bilateral pneumonia in May 2018 with associated rhinovirus.  There was a question if  there was amiodarone toxicity however patient clinically improved with antibiotics and serial CT chest showed marked improvement in the multifocal airspace opacities.  This was felt less likely to be amiodarone toxicity. Patient denies any chest pain hemoptysis orthopnea or increased lower extremity edema. Patient does not have shortness of breath at rest.  She lives with her daughter. Patient has a lifeline alert system. HRCT chest done today , results pending .       No Known Allergies  Immunization History  Administered Date(s) Administered  . Influenza Split 10/11/2011, 11/28/2012, 10/02/2016  . Influenza,inj,Quad PF,6+ Mos 11/07/2014, 09/12/2015  . Influenza-Unspecified 11/19/2013  . Pneumococcal Conjugate-13 12/25/2015  . Pneumococcal Polysaccharide-23 01/13/2008    Past Medical History:  Diagnosis Date  . Arthritis   . Asthma   . Atrial fibrillation ([place])    a. s/p DCCV in 2015, on Amiodarone and Eliquis  . PVC's (premature ventricular contractions)     Tobacco History: Social History   Tobacco Use  Smoking Status Former Smoker  . Packs/day: 1.00  . Years: 20.00  . Pack years: 20.00  . Types: Cigarettes  . Last attempt to quit: 04/13/1981  . Years since quitting: 35.9  Smokeless Tobacco Never Used   Counseling given: Not Answered   Outpatient Encounter Medications as of 03/04/2017  Medication Sig  . acetaminophen (TYLENOL) 325 MG tablet Take 2 tablets (650 mg total) by mouth every 6 (six) hours as needed for mild pain or headache.  Marland Kitchen  albuterol (PROAIR HFA) 108 (90 Base) MCG/ACT inhaler Inhale 2 puffs into the lungs every 4 (four) hours as needed for wheezing or shortness of breath.  Marland Kitchen amiodarone (PACERONE) 200 MG tablet TAKE 1 TABLET BY MOUTH DAILY  . apixaban (ELIQUIS) 5 MG TABS tablet Take 1 tablet (5 mg total) by mouth 2 (two) times daily.  . cefdinir (OMNICEF) 300 MG capsule Take 1 capsule (300 mg total) by mouth 2 (two) times daily.  Marland Kitchen diltiazem (TIAZAC)  120 MG 24 hr capsule TAKE 1 CAPSULE BY MOUTH DAILY  . famotidine (PEPCID) 20 MG tablet Take 1 tablet (20 mg total) by mouth at bedtime.  . furosemide (LASIX) 20 MG tablet Take 20 mg by mouth as needed.  . montelukast (SINGULAIR) 10 MG tablet Take 1 tablet (10 mg total) by mouth daily.  . Multiple Vitamins-Minerals (MULTIVITAMIN WITH MINERALS) tablet Take 1 tablet by mouth daily.  . pantoprazole (PROTONIX) 40 MG tablet Take 1 tablet (40 mg total) by mouth daily before breakfast.  . polyethylene glycol (MIRALAX / GLYCOLAX) packet Take 17 g by mouth daily as needed.  . SYMBICORT 160-4.5 MCG/ACT inhaler INHALE 2 PUFFS FIRST THING IN THE MORNING, AND 2 MORE IN 12 HOURS  . traMADol (ULTRAM) 50 MG tablet TAKE 1/2 TO 1 TABLET BY MOUTH EVERY 8 HOURS AS NEEDED FOR HIP PAIN  . triamcinolone cream (KENALOG) 0.1 % Apply 1 application topically 2 (two) times daily. Apply to rash on side as needed  . azithromycin (ZITHROMAX Z-PAK) 250 MG tablet Take 1 tablet (250 mg total) by mouth daily.   No facility-administered encounter medications on file as of 03/04/2017.      Review of Systems  Constitutional:   No  weight loss, night sweats,  Fevers, chills, + fatigue, or  lassitude.  HEENT:   No headaches,  Difficulty swallowing,  Tooth/dental problems, or  Sore throat,                No sneezing, itching, ear ache, nasal congestion, post nasal drip,   CV:  No chest pain,  Orthopnea, PND, swelling in lower extremities, anasarca, dizziness, palpitations, syncope.   GI  No heartburn, indigestion, abdominal pain, nausea, vomiting, diarrhea, change in bowel habits, loss of appetite, bloody stools.   Resp:  .  No chest wall deformity  Skin: no rash or lesions.  GU: no dysuria, change in color of urine, no urgency or frequency.  No flank pain, no hematuria   MS:  No joint pain or swelling.  No decreased range of motion.  No back pain.    Physical Exam  BP 128/80 (BP Location: Left Arm, Cuff Size: Normal)    Pulse 61   Ht _0  (1.575 m)   Wt 168 lb (76.2 kg)   SpO2 98%   BMI 30.73 kg/m   GEN: A/Ox3; pleasant , NAD,  Elderly in wc    HEENT:  /AT,  EACs-clear, TMs-wnl, NOSE-clear, THROAT-clear, no lesions, no postnasal drip or exudate noted.   NECK:  Supple w/ fair ROM; no JVD; normal carotid impulses w/o bruits; no thyromegaly or nodules palpated; no lymphadenopathy.    RESP  Decreased BS in bases , speaks in full sentences w/  no accessory muscle use, no dullness to percussion  CARD:  RRR, no m/r/g, min /tr  peripheral edema, pulses intact, no cyanosis or clubbing.  GI:   Soft & nt; nml bowel sounds; no organomegaly or masses detected.   Musco: Warm bil, no deformities or  joint swelling noted.   Neuro: alert, no focal deficits noted.    Skin: Warm, no lesions or rashes    Lab Results:  CBC    Component Value Date/Time   WBC 13.8 (H) 03/03/2017 1015   WBC 8.5 01/13/2017 1403   RBC 4.30 03/03/2017 1015   RBC 4.05 01/13/2017 1403   HGB 10.2 (L) 03/03/2017 1015   HCT 33.3 (L) 03/03/2017 1015   PLT 467 (H) 03/03/2017 1015   MCV 77 (L) 03/03/2017 1015   MCH 23.7 (L) 03/03/2017 1015   MCH 26.8 06/05/2016 0537   MCHC 30.6 (L) 03/03/2017 1015   MCHC 31.6 01/13/2017 1403   RDW 16.7 (H) 03/03/2017 1015   LYMPHSABS 1.7 03/03/2017 1015   MONOABS 0.7 06/05/2016 0537   EOSABS 0.3 03/03/2017 1015   BASOSABS 0.0 03/03/2017 1015    BMET    Component Value Date/Time   NA 141 03/03/2017 1015   K 4.5 03/03/2017 1015   CL 102 03/03/2017 1015   CO2 22 03/03/2017 1015   GLUCOSE 95 03/03/2017 1015   GLUCOSE 110 (H) 01/13/2017 1403   BUN 19 03/03/2017 1015   CREATININE 1.12 (H) 03/03/2017 1015   CREATININE 1.25 (H) 01/02/2014 1722   CALCIUM 9.1 03/03/2017 1015   CALCIUM 9.3 04/22/2011 0000   GFRNONAA 43 (L) 03/03/2017 1015   GFRNONAA 49 (L) 04/22/2011 0000   GFRAA 50 (L) 03/03/2017 1015   GFRAA 56 (L) 04/22/2011 0000    BNP    Component Value Date/Time   BNP 120.1 (H)  08/11/2016 1439   BNP 176.2 (H) 06/02/2016 1659   BNP 86.4 07/15/2013 1442    ProBNP    Component Value Date/Time   PROBNP 104.0 (H) 02/15/2013 1323    Imaging: Dg Chest 2 View  Result Date: 03/03/2017 CLINICAL DATA:  Shortness of Breath for 3 weeks EXAM: CHEST  2 VIEW COMPARISON:  01/13/2017 FINDINGS: There is hyperinflation of the lungs compatible with COPD. Patchy airspace opacities in both lower lobes and the left upper lobe have increased since prior study and could reflect areas of pneumonia. Heart is normal size. No effusions or acute bony abnormality. IMPRESSION: COPD. Patchy airspace opacities in both lungs concerning for multifocal pneumonia. Electronically Signed   By: Rolm Baptise M.D.   On: 03/03/2017 10:49   Ct Chest High Resolution  Result Date: 03/04/2017 CLINICAL DATA:  History of pneumonia. New patchy opacities on chest radiograph. Evaluate for interstitial lung disease. EXAM: CT CHEST WITHOUT CONTRAST TECHNIQUE: Multidetector CT imaging of the chest was performed following the standard protocol without intravenous contrast. High resolution imaging of the lungs, as well as inspiratory and expiratory imaging, was performed. COMPARISON:  Chest radiograph from one day prior. 09/22/2016 chest CT. FINDINGS: Cardiovascular: Normal heart size. Mild lipomatosis hypertrophy of the interatrial septum, unchanged. No significant pericardial fluid/thickening. Left anterior descending coronary atherosclerosis. Atherosclerotic nonaneurysmal thoracic aorta. Normal caliber pulmonary arteries. Mediastinum/Nodes: Stable exophytic 0.9 cm inferior left thyroid lobe nodule. Unremarkable esophagus. No axillary adenopathy. Newly mildly enlarged 1.1 cm subcarinal node (series 2/image 64). Newly mildly enlarged 1.0 cm right paratracheal node (series 2/image 36). No discrete hilar adenopathy on these noncontrast images. Lungs/Pleura: No pneumothorax. No pleural effusion. Extensive patchy ground-glass  opacities throughout both lungs involving all lung lobes, largely new since 09/22/2016 chest CT, predominantly peribronchovascular in distribution. No acute consolidative airspace disease, lung masses or significant pulmonary nodules. Stable scattered tiny calcified granulomas in both lungs. No significant regions of traction bronchiectasis, architectural distortion, parenchymal  banding or frank honeycombing. Mild patchy air trapping in both lungs on the expiration sequence. Upper abdomen: Cholecystectomy. Simple 1.1 cm upper right renal cyst. Musculoskeletal: No aggressive appearing focal osseous lesions. Moderate thoracic spondylosis. IMPRESSION: 1. Nonspecific extensive patchy ground-glass opacities throughout both lungs involving all lung lobes, largely new since 09/22/2016 chest CT, predominantly peribronchovascular. Differential includes infectious etiologies such as resolving bronchopneumonia or viral pneumonitis, inflammatory causes such as acute interstitial pneumonia (AIP) or hypersensitivity pneumonitis, diffuse alveolar hemorrhage or drug toxicity. Follow-up high-resolution chest CT study is suggested in 6 months or earlier as clinically warranted. 2. New mild mediastinal lymphadenopathy, nonspecific, most likely reactive. These nodes can also be reassessed on follow-up chest CTin 6 months. 3. One vessel coronary atherosclerosis. Aortic Atherosclerosis (ICD10-I70.0). Electronically Signed   By: Ilona Sorrel M.D.   On: 03/04/2017 15:07     Assessment & Plan:   CAP (community acquired pneumonia) Bilateral CAP w/ patchy aspdz .  Long discussion with patient and her daughter as due to patient's age comorbidities patient is high risk for decompensation.  Patient does not want to be admitted to the hospital.  He is tolerating medications.  Appetite is fair and taking in food and liquids well.  There is no dyspnea at rest.  Her vital signs are stable with good O2 saturations at rest.  At 98% on room  air. has no increased work of breathing at rest.  Have advised patient and her daughter that we will try outpatient therapy with antibiotics and close follow-up.  However if her symptoms change or worsen she is to go to the emergency room immediately for hospitalization.  Red Flags to look out for would be increased shortness of breath decreased appetite or inability to tolerate medications.  Suspect this is an infectious pneumonia with elevated white blood cell count.  However patient is on amiodarone and her ESR is quite elevated it is possible this may be a component of amiodarone toxicity however patient's O2 saturations are quite good. Would consider holding amiodarone for now as patient has been in normal sinus rhythm for some time and seems to be well controlled on diltiazem and Eliquis.  Hold on steroids at this time as unable to determine if this is Amio toxicity .  Will have close follow-up with chest x-ray in 1 week.  We will add in azithromycin to her current regimen of Omnicef Discussed with Dr. Elsworth Soho and [name] regarding holding her amiodarone  Plan  Patient Instructions  Mckinley Jewel as directed.  Add Azithromycin daily for 5 days .  Mucinex DM Twice daily  As needed  Cough/congestion  Continue on Symbicort Twice daily  .  Hold Amiodarone for now .  Follow up in week with chest xray .  Please contact office for sooner follow up if symptoms do not improve or worsen or seek emergency care        Intrinsic asthma Appears to be stable.  Continue on Symbicort.  No role for steroids at this time.      [name], NP 03/04/2017

## 2017-03-04 NOTE — Assessment & Plan Note (Addendum)
Bilateral CAP w/ patchy aspdz .  Long discussion with patient and her daughter as due to patient's age comorbidities patient is high risk for decompensation.  Patient does not want to be admitted to the hospital.  He is tolerating medications.  Appetite is fair and taking in food and liquids well.  There is no dyspnea at rest.  Her vital signs are stable with good O2 saturations at rest.  At 98% on room air. has no increased work of breathing at rest.  Have advised patient and her daughter that we will try outpatient therapy with antibiotics and close follow-up.  However if her symptoms change or worsen she is to go to the emergency room immediately for hospitalization.  Red Flags to look out for would be increased shortness of breath decreased appetite or inability to tolerate medications.  Suspect this is an infectious pneumonia with elevated white blood cell count.  However patient is on amiodarone and her ESR is quite elevated it is possible this may be a component of amiodarone toxicity however patient's O2 saturations are quite good. Would consider holding amiodarone for now as patient has been in normal sinus rhythm for some time and seems to be well controlled on diltiazem and Eliquis.  Hold on steroids at this time as unable to determine if this is Amio toxicity .  Will have close follow-up with chest x-ray in 1 week.  We will add in azithromycin to her current regimen of Omnicef Discussed with Dr. Elsworth Soho and [name] regarding holding her amiodarone  Plan  Patient Instructions  Crystal Kerr as directed.  Add Azithromycin daily for 5 days .  Mucinex DM Twice daily  As needed  Cough/congestion  Continue on Symbicort Twice daily  .  Hold Amiodarone for now .  Follow up in week with chest xray .  Please contact office for sooner follow up if symptoms do not improve or worsen or seek emergency care

## 2017-03-04 NOTE — Telephone Encounter (Signed)
Left voice mail on machine for patient to return phone call back regarding TP recommendations. X1 TP advised that she called spoke with RA and Jene Every;  all advise to stay off of the amiodarone medications until f/u with TP in [place] next week. Routing to JJ to f/u

## 2017-03-04 NOTE — Assessment & Plan Note (Signed)
Appears to be stable.  Continue on Symbicort.  No role for steroids at this time.

## 2017-03-04 NOTE — Patient Instructions (Signed)
You do have concerning findings for bilateral/multifocal pneumonia on chest x-ray and year WBC levels are elevated.  In addition you are unable to ambulate well without significant shortness of breath recently.  You very well may need to be seen in the emergency department and/or need to be admitted.  I decided since you do have CT of your chest already set up afternoon and are going to see the pulmonologist that I will defer decision to them.  I really think there is a strong likelihood that you may need to be admitted in the and due to your age and baseline lung status.  Follow-up with pulmonology as scheduled later today.  If anything changes dramatically during the interim before CT and pulmonology visit then be seen in the emergency department.

## 2017-03-04 NOTE — Telephone Encounter (Addendum)
-----   Message from Lelon Perla, MD sent at 03/04/2017  7:31 AM EST ----- High resolution CT of lungs; pulmonary eval with Dr Elsworth Soho (he has seen previously); changes on Xray appears to have predated amiodarone; may need to DC amiodarone of Dr Elsworth Soho feels amiodarone lung toxicity. [place] with pt dtr, CT scan will be done today @ 1:30 PM in the [facility] [place].  Spoke with dr Bari Mantis office, patient scheduled to see tammy parrett in the [place] office @ 2:15 pm Patients dtr aware of above

## 2017-03-05 ENCOUNTER — Telehealth: Payer: Self-pay | Admitting: Cardiology

## 2017-03-05 NOTE — Telephone Encounter (Signed)
Crystal Kerr is returning a call . Thanks

## 2017-03-05 NOTE — Telephone Encounter (Signed)
[name] notified of patient's CT results. Med list updated. Routed to [place], Therapist, sports for repeat order

## 2017-03-05 NOTE — Telephone Encounter (Signed)
Called spoke with patient's daughter [name] and advised of TP/RA/Crenshaw's recs to stay off the Amiodarone until next follow up.  [name] voiced her understanding and reported that Dr Jacalyn Lefevre office has also called and relayed this same information.  Patient is doing better since office yesterday w/ TP and has had an increase in appetite.  TP is aware.  Nothing further needed; will sign off

## 2017-03-09 ENCOUNTER — Other Ambulatory Visit: Payer: Self-pay | Admitting: Family Medicine

## 2017-03-09 DIAGNOSIS — M25552 Pain in left hip: Secondary | ICD-10-CM

## 2017-03-10 NOTE — Telephone Encounter (Signed)
Pt is requesting refill on tramadol 50mg .

## 2017-03-11 ENCOUNTER — Ambulatory Visit (INDEPENDENT_AMBULATORY_CARE_PROVIDER_SITE_OTHER): Payer: Medicare Other | Admitting: Adult Health

## 2017-03-11 ENCOUNTER — Encounter: Payer: Self-pay | Admitting: Adult Health

## 2017-03-11 ENCOUNTER — Ambulatory Visit (HOSPITAL_BASED_OUTPATIENT_CLINIC_OR_DEPARTMENT_OTHER)
Admission: RE | Admit: 2017-03-11 | Discharge: 2017-03-11 | Disposition: A | Discharge status: Home / Self Care | Payer: Medicare Other | Source: Ambulatory Visit | Attending: Adult Health | Admitting: Adult Health

## 2017-03-11 DIAGNOSIS — R918 Other nonspecific abnormal finding of lung field: Secondary | ICD-10-CM | POA: Diagnosis not present

## 2017-03-11 DIAGNOSIS — R05 Cough: Secondary | ICD-10-CM | POA: Diagnosis not present

## 2017-03-11 DIAGNOSIS — I480 Paroxysmal atrial fibrillation: Secondary | ICD-10-CM

## 2017-03-11 DIAGNOSIS — I700 Atherosclerosis of aorta: Secondary | ICD-10-CM | POA: Insufficient documentation

## 2017-03-11 DIAGNOSIS — J189 Pneumonia, unspecified organism: Secondary | ICD-10-CM | POA: Insufficient documentation

## 2017-03-11 DIAGNOSIS — J45909 Unspecified asthma, uncomplicated: Secondary | ICD-10-CM

## 2017-03-11 MED ORDER — PREDNISONE 10 MG PO TABS
ORAL_TABLET | ORAL | 0 refills | Status: DC
Start: 2017-03-11 — End: 2017-03-24

## 2017-03-11 NOTE — Progress Notes (Signed)
@Patient  ID: Crystal Kerr, female    DOB: 14-Jun-1924, 84 y.o.   MRN: [id number]  Chief Complaint  Patient presents with  . Follow-up    PNA     Referring provider: [name], MD  HPI: 84 year old female seen in May 2018 after a pneumonia complicated by rhinovirus.  CT chest showed groundglass opacities and clustered nodules consistent with a acute pneumonia larger nodule with possible area of cavitation PMH -Asthma   TEST  PFTs 03/20/14 FEV1 1.28 (85%) ratio 75 s resp to saba and dlco 68 corrects to 103 CT chest Jun 02, 2016 showed groundglass opacities and clustered nodules compatible with an acute pneumonia.  There is suggestion of cavitation with a larger nodules although this is difficult to distinguish from the bronchiectatic changes. CT chest September 22, 2016 showed market improvement in the multifocal bilateral centrally  airspace disease on previous study.   03/11/2017 Follow up : PNA Patient returns for a one-week follow-up.  She was seen last visit for bilateral pneumonia.  A chest x-ray showed bilateral patchy airspace opacities.  White blood cell count was elevated at 13,000 with a left shift.  She was placed on Omnicef for 10 days by her primary care physician.  Patient has been on amiodarone for A. fib.  Her sedimentation rate was elevated at 99.  There was a light suspicion for possible amiodarone toxicity and her amiodarone was held.  Resolution CT chest showed extensive patchy groundglass opacities throughout both lungs involving all lung lobes that was new since September 2018.  Azithromycin was added to her regimen of Omnicef. She had a similar episode in May 2018 with bilateral pneumonia with associated rhinovirus.  She had clinical and radiographical improvement with antibiotics.  Thus felt less likely to be amiodarone toxicity. Since last visit patient is feeling improved she has decreased fatigue and shortness of breath.  Her cough and  congestion are improved however she does have quite a bit of rattling mucus. Has some intermittent wheezing.  She denies any hemoptysis chest pain orthopnea PND or leg swelling.  Appetite is good.  No nausea vomiting or diarrhea.  She remains on Symbicort twice daily.  Chest x-ray today shows clearance of bilateral infiltrates.  With persistent increase underlying interstitial markings..   No Known Allergies  Immunization History  Administered Date(s) Administered  . Influenza Split 10/11/2011, 11/28/2012, 10/02/2016  . Influenza,inj,Quad PF,6+ Mos 11/07/2014, 09/12/2015  . Influenza-Unspecified 11/19/2013  . Pneumococcal Conjugate-13 12/25/2015  . Pneumococcal Polysaccharide-23 01/13/2008    Past Medical History:  Diagnosis Date  . Arthritis   . Asthma   . Atrial fibrillation ([place])    a. s/p DCCV in 2015, on Amiodarone and Eliquis  . PVC's (premature ventricular contractions)     Tobacco History: Social History   Tobacco Use  Smoking Status Former Smoker  . Packs/day: 1.00  . Years: 20.00  . Pack years: 20.00  . Types: Cigarettes  . Last attempt to quit: 04/13/1981  . Years since quitting: 35.9  Smokeless Tobacco Never Used   Counseling given: Not Answered   Outpatient Encounter Medications as of 03/11/2017  Medication Sig  . acetaminophen (TYLENOL) 325 MG tablet Take 2 tablets (650 mg total) by mouth every 6 (six) hours as needed for mild pain or headache.  . albuterol (PROAIR HFA) 108 (90 Base) MCG/ACT inhaler Inhale 2 puffs into the lungs every 4 (four) hours as needed for wheezing or shortness of breath.  Marland Kitchen apixaban (ELIQUIS) 5 MG TABS  tablet Take 1 tablet (5 mg total) by mouth 2 (two) times daily.  Marland Kitchen azithromycin (ZITHROMAX Z-PAK) 250 MG tablet Take 1 tablet (250 mg total) by mouth daily.  . cefdinir (OMNICEF) 300 MG capsule Take 1 capsule (300 mg total) by mouth 2 (two) times daily.  Marland Kitchen diltiazem (TIAZAC) 120 MG 24 hr capsule TAKE 1 CAPSULE BY MOUTH DAILY  .  famotidine (PEPCID) 20 MG tablet Take 1 tablet (20 mg total) by mouth at bedtime.  . furosemide (LASIX) 20 MG tablet Take 20 mg by mouth as needed.  . montelukast (SINGULAIR) 10 MG tablet Take 1 tablet (10 mg total) by mouth daily.  . Multiple Vitamins-Minerals (MULTIVITAMIN WITH MINERALS) tablet Take 1 tablet by mouth daily.  . pantoprazole (PROTONIX) 40 MG tablet Take 1 tablet (40 mg total) by mouth daily before breakfast.  . polyethylene glycol (MIRALAX / GLYCOLAX) packet Take 17 g by mouth daily as needed.  . SYMBICORT 160-4.5 MCG/ACT inhaler INHALE 2 PUFFS FIRST THING IN THE MORNING, AND 2 MORE IN 12 HOURS  . traMADol (ULTRAM) 50 MG tablet TAKE 1/2 TO 1 TABLET BY MOUTH EVERY 8 HOURS AS NEEDED FOR HIP PAIN  . triamcinolone cream (KENALOG) 0.1 % Apply 1 application topically 2 (two) times daily. Apply to rash on side as needed  . predniSONE (DELTASONE) 10 MG tablet 2 tabs daily for 5 days then stop   No facility-administered encounter medications on file as of 03/11/2017.      Review of Systems  Constitutional:   No  weight loss, night sweats,  Fevers, chills, + fatigue, or  lassitude.  HEENT:   No headaches,  Difficulty swallowing,  Tooth/dental problems, or  Sore throat,                No sneezing, itching, ear ache,  +nasal congestion, post nasal drip,   CV:  No chest pain,  Orthopnea, PND, swelling in lower extremities, anasarca, dizziness, palpitations, syncope.   GI  No heartburn, indigestion, abdominal pain, nausea, vomiting, diarrhea, change in bowel habits, loss of appetite, bloody stools.   Resp:  .  No chest wall deformity  Skin: no rash or lesions.  GU: no dysuria, change in color of urine, no urgency or frequency.  No flank pain, no hematuria   MS:  No joint pain or swelling.  No decreased range of motion.  No back pain.    Physical Exam  BP 130/70 (BP Location: Left Arm, Cuff Size: Normal)   Pulse 71   Ht 5\' 2"  (1.575 m)   Wt 167 lb (75.8 kg)   SpO2 94%    BMI 30.54 kg/m   GEN: A/Ox3; pleasant , NAD, elderly   HEENT:  [place]/AT,  EACs-clear, TMs-wnl, NOSE-clear, THROAT-clear, no lesions, no postnasal drip or exudate noted.   NECK:  Supple w/ fair ROM; no JVD; normal carotid impulses w/o bruits; no thyromegaly or nodules palpated; no lymphadenopathy.    RESP few scattered rhonchi and faint expiratory wheezes , no accessory muscle use, no dullness to percussion  CARD:  RRR, no m/r/g, no peripheral edema, pulses intact, no cyanosis or clubbing.  GI:   Soft & nt; nml bowel sounds; no organomegaly or masses detected.   Musco: Warm bil, no deformities or joint swelling noted.   Neuro: alert, no focal deficits noted.    Skin: Warm, no lesions or rashes    Lab Results:  CBC    Component Value Date/Time   WBC 13.8 (H) 03/03/2017 1015  WBC 8.5 01/13/2017 1403   RBC 4.30 03/03/2017 1015   RBC 4.05 01/13/2017 1403   HGB 10.2 (L) 03/03/2017 1015   HCT 33.3 (L) 03/03/2017 1015   PLT 467 (H) 03/03/2017 1015   MCV 77 (L) 03/03/2017 1015   MCH 23.7 (L) 03/03/2017 1015   MCH 26.8 06/05/2016 0537   MCHC 30.6 (L) 03/03/2017 1015   MCHC 31.6 01/13/2017 1403   RDW 16.7 (H) 03/03/2017 1015   LYMPHSABS 1.7 03/03/2017 1015   MONOABS 0.7 06/05/2016 0537   EOSABS 0.3 03/03/2017 1015   BASOSABS 0.0 03/03/2017 1015    BMET    Component Value Date/Time   NA 141 03/03/2017 1015   K 4.5 03/03/2017 1015   CL 102 03/03/2017 1015   CO2 22 03/03/2017 1015   GLUCOSE 95 03/03/2017 1015   GLUCOSE 110 (H) 01/13/2017 1403   BUN 19 03/03/2017 1015   CREATININE 1.12 (H) 03/03/2017 1015   CREATININE 1.25 (H) 01/02/2014 1722   CALCIUM 9.1 03/03/2017 1015   CALCIUM 9.3 04/22/2011 0000   GFRNONAA 43 (L) 03/03/2017 1015   GFRNONAA 49 (L) 04/22/2011 0000   GFRAA 50 (L) 03/03/2017 1015   GFRAA 56 (L) 04/22/2011 0000    BNP    Component Value Date/Time   BNP 120.1 (H) 08/11/2016 1439   BNP 176.2 (H) 06/02/2016 1659   BNP 86.4 07/15/2013 1442     ProBNP    Component Value Date/Time   PROBNP 104.0 (H) 02/15/2013 1323    Imaging: Dg Chest 2 View  Result Date: 03/11/2017 CLINICAL DATA:  Follow-up pneumonia. Persistent fatigue, shortness of breath, and cough. History of asthma, former smoker. EXAM: CHEST  2 VIEW COMPARISON:  Chest x-ray of March 03, 2017 and chest CT scan of March 04, 2017. FINDINGS: The lungs are well-expanded. The interstitial markings remain increased. Areas of confluent airspace opacity in the left upper lobe and right lower lobe have cleared. The cardiac silhouette remains enlarged. The pulmonary vascularity is not engorged. There is calcification in the wall of the aortic arch. There is no pleural effusion. There is multilevel degenerative disc disease of the thoracic spine. IMPRESSION: Interval clearing of alveolar infiltrates bilaterally consistent with response to therapy. Persistently increased underlying interstitial markings bilaterally likely reflect chronic infectious or post inflammatory processes. Thoracic aortic atherosclerosis. Electronically Signed   By: David  Martinique M.D.   On: 03/11/2017 13:18   Dg Chest 2 View  Result Date: 03/03/2017 CLINICAL DATA:  Shortness of Breath for 3 weeks EXAM: CHEST  2 VIEW COMPARISON:  01/13/2017 FINDINGS: There is hyperinflation of the lungs compatible with COPD. Patchy airspace opacities in both lower lobes and the left upper lobe have increased since prior study and could reflect areas of pneumonia. Heart is normal size. No effusions or acute bony abnormality. IMPRESSION: COPD. Patchy airspace opacities in both lungs concerning for multifocal pneumonia. Electronically Signed   By: Rolm Baptise M.D.   On: 03/03/2017 10:49   Ct Chest High Resolution  Result Date: 03/04/2017 CLINICAL DATA:  History of pneumonia. New patchy opacities on chest radiograph. Evaluate for interstitial lung disease. EXAM: CT CHEST WITHOUT CONTRAST TECHNIQUE: Multidetector CT imaging of the  chest was performed following the standard protocol without intravenous contrast. High resolution imaging of the lungs, as well as inspiratory and expiratory imaging, was performed. COMPARISON:  Chest radiograph from one day prior. 09/22/2016 chest CT. FINDINGS: Cardiovascular: Normal heart size. Mild lipomatosis hypertrophy of the interatrial septum, unchanged. No significant pericardial fluid/thickening. Left  anterior descending coronary atherosclerosis. Atherosclerotic nonaneurysmal thoracic aorta. Normal caliber pulmonary arteries. Mediastinum/Nodes: Stable exophytic 0.9 cm inferior left thyroid lobe nodule. Unremarkable esophagus. No axillary adenopathy. Newly mildly enlarged 1.1 cm subcarinal node (series 2/image 64). Newly mildly enlarged 1.0 cm right paratracheal node (series 2/image 36). No discrete hilar adenopathy on these noncontrast images. Lungs/Pleura: No pneumothorax. No pleural effusion. Extensive patchy ground-glass opacities throughout both lungs involving all lung lobes, largely new since 09/22/2016 chest CT, predominantly peribronchovascular in distribution. No acute consolidative airspace disease, lung masses or significant pulmonary nodules. Stable scattered tiny calcified granulomas in both lungs. No significant regions of traction bronchiectasis, architectural distortion, parenchymal banding or frank honeycombing. Mild patchy air trapping in both lungs on the expiration sequence. Upper abdomen: Cholecystectomy. Simple 1.1 cm upper right renal cyst. Musculoskeletal: No aggressive appearing focal osseous lesions. Moderate thoracic spondylosis. IMPRESSION: 1. Nonspecific extensive patchy ground-glass opacities throughout both lungs involving all lung lobes, largely new since 09/22/2016 chest CT, predominantly peribronchovascular. Differential includes infectious etiologies such as resolving bronchopneumonia or viral pneumonitis, inflammatory causes such as acute interstitial pneumonia (AIP) or  hypersensitivity pneumonitis, diffuse alveolar hemorrhage or drug toxicity. Follow-up high-resolution chest CT study is suggested in 6 months or earlier as clinically warranted. 2. New mild mediastinal lymphadenopathy, nonspecific, most likely reactive. These nodes can also be reassessed on follow-up chest CTin 6 months. 3. One vessel coronary atherosclerosis. Aortic Atherosclerosis (ICD10-I70.0). Electronically Signed   By: Ilona Sorrel M.D.   On: 03/04/2017 15:07     Assessment & Plan:   CAP (community acquired pneumonia) Bilateral pneumonia clinically improving on antibiotics.  Chest x-ray does show significant improvement. Patient has some lingering asthmatic bronchitic symptoms with cough and wheezing.  We will add low-dose short-term prednisone.  Continue on Symbicort.  He has a couple days of Omnicef left and is to finish this. She is to remain off the amiodarone she has an upcoming appointment with cardiology and will discuss this on return.  Repeat Sed rate on return  Plan  Patient Instructions  Mckinley Jewel as directed.  Prednisone 20mg  daily for 5 days. Take with food.  Mucinex DM Twice daily  As needed  Cough/congestion  Continue on Symbicort Twice daily  .  Hold Amiodarone for now . Make follow up with Cardiology to discuss if additional treatment options are needed.  Follow up in 6 weeks and As needed   Please contact office for sooner follow up if symptoms do not improve or worsen or seek emergency care        Intrinsic asthma Mild flare with recent PNA   Plan  Patient Instructions  Mckinley Jewel as directed.  Prednisone 20mg  daily for 5 days. Take with food.  Mucinex DM Twice daily  As needed  Cough/congestion  Continue on Symbicort Twice daily  .  Hold Amiodarone for now . Make follow up with Cardiology to discuss if additional treatment options are needed.  Follow up in 6 weeks and As needed   Please contact office for sooner follow up if symptoms do not  improve or worsen or seek emergency care       AF (paroxysmal atrial fibrillation) ([place]) Remains in NSR - cont off Amioadarone .  follow up with cards in 2 weeks as planned .Please contact office for sooner follow up if symptoms do not improve or worsen or seek emergency care        [name], NP 03/11/2017

## 2017-03-11 NOTE — Assessment & Plan Note (Signed)
Mild flare with recent PNA   Plan  Patient Instructions  Crystal Kerr as directed.  Prednisone 20mg  daily for 5 days. Take with food.  Mucinex DM Twice daily  As needed  Cough/congestion  Continue on Symbicort Twice daily  .  Hold Amiodarone for now . Make follow up with Cardiology to discuss if additional treatment options are needed.  Follow up in 6 weeks and As needed   Please contact office for sooner follow up if symptoms do not improve or worsen or seek emergency care

## 2017-03-11 NOTE — Patient Instructions (Addendum)
[employer] as directed.  Prednisone 20mg  daily for 5 days. Take with food.  Mucinex DM Twice daily  As needed  Cough/congestion  Continue on Symbicort Twice daily  .  Hold Amiodarone for now . Make follow up with Cardiology to discuss if additional treatment options are needed.  Follow up in 6 weeks and As needed   Please contact office for sooner follow up if symptoms do not improve or worsen or seek emergency care

## 2017-03-11 NOTE — Assessment & Plan Note (Signed)
Remains in NSR - cont off Amioadarone .  follow up with cards in 2 weeks as planned .Please contact office for sooner follow up if symptoms do not improve or worsen or seek emergency care

## 2017-03-11 NOTE — Assessment & Plan Note (Signed)
Bilateral pneumonia clinically improving on antibiotics.  Chest x-ray does show significant improvement. Patient has some lingering asthmatic bronchitic symptoms with cough and wheezing.  We will add low-dose short-term prednisone.  Continue on Symbicort.  He has a couple days of Omnicef left and is to finish this. She is to remain off the amiodarone she has an upcoming appointment with cardiology and will discuss this on return.  Repeat Sed rate on return  Plan  Patient Instructions  Crystal Kerr as directed.  Prednisone 20mg  daily for 5 days. Take with food.  Mucinex DM Twice daily  As needed  Cough/congestion  Continue on Symbicort Twice daily  .  Hold Amiodarone for now . Make follow up with Cardiology to discuss if additional treatment options are needed.  Follow up in 6 weeks and As needed   Please contact office for sooner follow up if symptoms do not improve or worsen or seek emergency care

## 2017-03-12 NOTE — Progress Notes (Signed)
Reviewed and agree with assessment/plan.   Laniece Hornbaker, MD [facility] 01/08/2016, 12:24 PM Pager:  [phone]  

## 2017-03-17 ENCOUNTER — Encounter: Payer: Self-pay | Admitting: Cardiology

## 2017-03-19 NOTE — Progress Notes (Signed)
HPI: FU atrial fibrillation. Patient had episode of atrial fibrillation in February 2015 in the setting of viral gastroenteritis. She had a TEE guided cardioversion and has maintained sinus rhythm on amiodarone. TEE February 2015showed normal LV function, lambl'sexcrescence, mild to moderate left atrial enlargement. At last office visit she was noted to have increasing dyspnea.  There was a question of pneumonia on chest x-ray and antibiotics were added.  Amiodarone discontinued as well.  High resolution chest CT showed extensive patchy groundglass opacities throughout both lungs.  Since she was last seen she has some dyspnea on exertion but much improved.  She denies chest pain, palpitations or syncope.  Current Outpatient Medications  Medication Sig Dispense Refill  . acetaminophen (TYLENOL) 325 MG tablet Take 2 tablets (650 mg total) by mouth every 6 (six) hours as needed for mild pain or headache. 30 tablet 0  . albuterol (PROAIR HFA) 108 (90 Base) MCG/ACT inhaler Inhale 2 puffs into the lungs every 4 (four) hours as needed for wheezing or shortness of breath. 18 g 5  . apixaban (ELIQUIS) 5 MG TABS tablet Take 1 tablet (5 mg total) by mouth 2 (two) times daily. 60 tablet 5  . diltiazem (TIAZAC) 120 MG 24 hr capsule TAKE 1 CAPSULE BY MOUTH DAILY 90 capsule 3  . famotidine (PEPCID) 20 MG tablet Take 1 tablet (20 mg total) by mouth at bedtime. 90 tablet 1  . furosemide (LASIX) 20 MG tablet Take 20 mg by mouth as needed.    . montelukast (SINGULAIR) 10 MG tablet Take 1 tablet (10 mg total) by mouth daily. 90 tablet 0  . Multiple Vitamins-Minerals (MULTIVITAMIN WITH MINERALS) tablet Take 1 tablet by mouth daily.    . pantoprazole (PROTONIX) 40 MG tablet Take 1 tablet (40 mg total) by mouth daily before breakfast. 90 tablet 3  . polyethylene glycol (MIRALAX / GLYCOLAX) packet Take 17 g by mouth daily as needed.    . SYMBICORT 160-4.5 MCG/ACT inhaler INHALE 2 PUFFS FIRST THING IN THE  MORNING, AND 2 MORE IN 12 HOURS 10.2 g 12  . traMADol (ULTRAM) 50 MG tablet TAKE 1/2 TO 1 TABLET BY MOUTH EVERY 8 HOURS AS NEEDED FOR HIP PAIN 60 tablet 0  . triamcinolone cream (KENALOG) 0.1 % Apply 1 application topically 2 (two) times daily. Apply to rash on side as needed 30 g 1   No current facility-administered medications for this visit.      Past Medical History:  Diagnosis Date  . Arthritis   . Asthma   . Atrial fibrillation ([place])    a. s/p DCCV in 2015, on Amiodarone and Eliquis  . PVC's (premature ventricular contractions)     Past Surgical History:  Procedure Laterality Date  . APPENDECTOMY    . CARDIOVERSION N/A 03/08/2013   Procedure: CARDIOVERSION;  Surgeon: Larey Dresser, MD;  Location: [facility];  Service: Cardiovascular;  Laterality: N/A;  . CHOLECYSTECTOMY    . oophrectomy    . TEE WITHOUT CARDIOVERSION N/A 03/08/2013   Procedure: TRANSESOPHAGEAL ECHOCARDIOGRAM (TEE);  Surgeon: Larey Dresser, MD;  Location: [facility];  Service: Cardiovascular;  Laterality: N/A;  . TONSILLECTOMY    . 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 Marital status: Divorced    Spouse name: Not on file  . Number of children: Not on file  . Years of education: Not on file  .  Highest education level: Not on file  Social Needs  . Financial resource strain: Not on file  . Food insecurity - worry: Not on file  . Food insecurity - inability: Not on file  . Transportation needs - medical: Not on file  . Transportation needs - non-medical: Not on file  Occupational History  . Occupation: Retired    Fish farm manager: RETIRED  Tobacco Use  . Smoking status: Former Smoker    Packs/day: 1.00    Years: 20.00    Pack years: 20.00    Types: Cigarettes    Last attempt to quit: 04/13/1981    Years since quitting: 35.9  . Smokeless tobacco: Never Used  Substance and Sexual Activity  .  Alcohol use: Yes    Comment: Occasional  . Drug use: No  . Sexual activity: No  Other Topics Concern  . Not on file  Social History Narrative  . Not on file    Family History  Problem Relation Age of Onset  . Heart failure Mother   . Diabetes Father   . Heart disease Unknown        No family history    ROS: mild cough but no fevers or chills, hemoptysis, dysphasia, odynophagia, melena, hematochezia, dysuria, hematuria, rash, seizure activity, orthopnea, PND, pedal edema, claudication. Remaining systems are negative.  Physical Exam: Well-developed well-nourished in no acute distress.  Skin is warm and dry.  HEENT is normal.  Neck is supple.  Chest is clear to auscultation with normal expansion.  Cardiovascular exam is regular rate and rhythm. 2/6 systolic murmur Abdominal exam nontender or distended. No masses palpated. Extremities show trace edema. neuro grossly intact   A/P  1 paroxysmal atrial fibrillation-patient remains in sinus rhythm.  Her amiodarone was held because of potential lung toxicity.  I am not convinced that her pulmonary process was completely related to amiodarone and was more likely pneumonia as it resolved rather quickly with antibiotics.  However we will continue to hold amiodarone for now.  Patient understands there is a higher risk of recurrent arrhythmia off of amiodarone.  Can consider resuming if she has recurrent atrial fibrillation.  Continue apixaban.  2 chronic diastolic congestive heart failure-patient appears to be euvolemic.  Continue present dose of diuretic and follow.  Continue fluid restriction to 1.5 L daily low-sodium diet.  3 hypertension-blood pressure is controlled.  Continue present medications.  4 dyspnea-previous chest x-ray at last office visit showed patchy bilateral airspace disease.  Patient was treated with antibiotics.  Her sed rate was elevated at 99 and her amiodarone was discontinued.  High resolution CT showed extensive  patchy groundglass opacities throughout both lungs which was new.  She improved with the above measures and it was felt amiodarone toxicity was therefore less likely.  Plan as outlined under 1.  Kirk Ruths, MD

## 2017-03-24 ENCOUNTER — Ambulatory Visit (INDEPENDENT_AMBULATORY_CARE_PROVIDER_SITE_OTHER): Payer: Medicare Other | Admitting: Cardiology

## 2017-03-24 ENCOUNTER — Encounter: Payer: Self-pay | Admitting: Cardiology

## 2017-03-24 VITALS — BP 162/69 | HR 62 | Ht 62.0 in | Wt 167.4 lb

## 2017-03-24 DIAGNOSIS — I480 Paroxysmal atrial fibrillation: Secondary | ICD-10-CM | POA: Diagnosis not present

## 2017-03-24 DIAGNOSIS — I5032 Chronic diastolic (congestive) heart failure: Secondary | ICD-10-CM

## 2017-03-24 DIAGNOSIS — R0600 Dyspnea, unspecified: Secondary | ICD-10-CM | POA: Diagnosis not present

## 2017-03-24 DIAGNOSIS — I10 Essential (primary) hypertension: Secondary | ICD-10-CM | POA: Diagnosis not present

## 2017-03-24 NOTE — Patient Instructions (Signed)
Medication Instructions:  Your physician recommends that you continue on your current medications as directed. Please refer to the Current Medication list given to you today.   Labwork: -None  Testing/Procedures: -None  Follow-Up: Your physician recommends that you keep your scheduled  follow-up appointment with [name].   Any Other Special Instructions Will Be Listed Below (If Applicable).     If you need a refill on your cardiac medications before your next appointment, please call your pharmacy.

## 2017-04-01 ENCOUNTER — Other Ambulatory Visit: Payer: Self-pay | Admitting: Family Medicine

## 2017-04-06 ENCOUNTER — Ambulatory Visit (INDEPENDENT_AMBULATORY_CARE_PROVIDER_SITE_OTHER): Payer: Medicare Other | Admitting: Adult Health

## 2017-04-06 ENCOUNTER — Encounter: Payer: Self-pay | Admitting: Adult Health

## 2017-04-06 DIAGNOSIS — I5033 Acute on chronic diastolic (congestive) heart failure: Secondary | ICD-10-CM

## 2017-04-06 DIAGNOSIS — J45909 Unspecified asthma, uncomplicated: Secondary | ICD-10-CM

## 2017-04-06 DIAGNOSIS — J301 Allergic rhinitis due to pollen: Secondary | ICD-10-CM | POA: Diagnosis not present

## 2017-04-06 MED ORDER — PREDNISONE 10 MG PO TABS: ORAL_TABLET | ORAL | 0 refills | Status: DC

## 2017-04-06 MED ORDER — AZITHROMYCIN 250 MG PO TABS: ORAL_TABLET | ORAL | 0 refills | Status: AC

## 2017-04-06 NOTE — Assessment & Plan Note (Signed)
Mild flare with URI.  Patient does not appear toxic. For now we will treat with brief course of prednisone, if mucus becomes discolored Z-Pak has been sent in   Plan  Patient Instructions  Zpack to have on hold if symptoms worsen with discolored mucus  Prednisone 20mg  daily for 4 days, take with food  Mucinex DM Twice daily  As needed  Cough/congestion  Decrease Water intake to 3 bottles (60 oz total ) daily .  Take Lasix 20mg  daily for 2 days then As needed  Leg swelling .  Follow up with Dr. [name]  In 2 weeks and As needed   Please contact office for sooner follow up if symptoms do not improve or worsen or seek emergency care

## 2017-04-06 NOTE — Assessment & Plan Note (Signed)
Does not appear to be acutely overloaded.  However have advised her to decrease her water intake down to more reasonable at 60 ounces daily.  And use Lasix for the next 2 days to prevent any significant volume overload.

## 2017-04-06 NOTE — Progress Notes (Signed)
$'@Patient'H$  ID: Crystal Kerr, female    DOB: May 31, 1924, 84 y.o.   MRN: [id number]  Chief Complaint  Patient presents with  . Acute Visit    Cough     Referring provider: [name], MD  HPI: 84 year old female seen in May 2018 after a pneumonia complicated by rhinovirus.  CT chest showed groundglass opacities and clustered nodules consistent with a acute pneumonia larger nodule with possible area of cavitation PMH -Asthma , AFib   TEST Echo 2015 EF 65%, Gr 2 DD , Mod Dilated RA  PFTs 03/20/14 FEV1 1.28 (85%) ratio 75 s resp to saba and dlco 68 corrects to 103 CT chest Jun 02, 2016 showed groundglass opacities and clustered nodules compatible with an acute pneumonia. There is suggestion of cavitation with a larger nodules although this is difficult to distinguish from the bronchiectatic changes. CT chest September 22, 2016 showed market improvement in the multifocal bilateralcentrallyairspace disease on previous study.  04/06/2017 Acute OV : Cough  Patient presents for an acute office visit.  She complains over the last 4 days that she had increased cough with thick mucus.  Mucus is remaining mainly white to clear.  She has had increased wheezing nasal drainage more fatigue and shortness of breath.  She denies any hemoptysis, discolored mucus, fever, abdominal pain nausea vomiting diarrhea or orthopnea.  Ankles have been swollen. She remains on Symbicort twice daily.  Pt was treated for Bilateral PNA last month with possible component of Amiodarone toxicity . She was treated with Omnicef for 10 days and Azithromycin . ESR was elevated at 99 . And WBC was elvated at 13, 000 . She had clinical improvement and CXR showed clearing of bilateral infiltrates .  She remains of Amidoarone with no known recurrence of A FIb .   She does have diastolic heart failure.  She does report that she has not been taking Lasix lately.  She does get lower extremity swelling.  She  reports that she takes in 80 ounces of water daily. Denies any orthopnea. No Known Allergies  Immunization History  Administered Date(s) Administered  . Influenza Split 10/11/2011, 11/28/2012, 10/02/2016  . Influenza,inj,Quad PF,6+ Mos 11/07/2014, 09/12/2015  . Influenza-Unspecified 11/19/2013  . Pneumococcal Conjugate-13 12/25/2015  . Pneumococcal Polysaccharide-23 01/13/2008    Past Medical History:  Diagnosis Date  . Arthritis   . Asthma   . Atrial fibrillation ([place])    a. s/p DCCV in 2015, on Amiodarone and Eliquis  . PVC's (premature ventricular contractions)     Tobacco History: Social History   Tobacco Use  Smoking Status Former Smoker  . Packs/day: 1.00  . Years: 20.00  . Pack years: 20.00  . Types: Cigarettes  . Last attempt to quit: 04/13/1981  . Years since quitting: 36.0  Smokeless Tobacco Never Used   Counseling given: Not Answered   Outpatient Encounter Medications as of 04/06/2017  Medication Sig  . acetaminophen (TYLENOL) 325 MG tablet Take 2 tablets (650 mg total) by mouth every 6 (six) hours as needed for mild pain or headache.  . albuterol (PROAIR HFA) 108 (90 Base) MCG/ACT inhaler Inhale 2 puffs into the lungs every 4 (four) hours as needed for wheezing or shortness of breath.  Marland Kitchen apixaban (ELIQUIS) 5 MG TABS tablet Take 1 tablet (5 mg total) by mouth 2 (two) times daily.  Marland Kitchen diltiazem (TIAZAC) 120 MG 24 hr capsule TAKE 1 CAPSULE BY MOUTH DAILY  . famotidine (PEPCID) 20 MG tablet Take 1 tablet (20 mg  total) by mouth at bedtime.  . furosemide (LASIX) 20 MG tablet Take 20 mg by mouth as needed.  . montelukast (SINGULAIR) 10 MG tablet TAKE 1 TABLET BY MOUTH DAILY  . Multiple Vitamins-Minerals (MULTIVITAMIN WITH MINERALS) tablet Take 1 tablet by mouth daily.  Marland Kitchen OVER THE COUNTER MEDICATION [employer] probiotic once daily  . pantoprazole (PROTONIX) 40 MG tablet Take 1 tablet (40 mg total) by mouth daily before breakfast.  . SYMBICORT 160-4.5 MCG/ACT inhaler  INHALE 2 PUFFS FIRST THING IN THE MORNING, AND 2 MORE IN 12 HOURS  . traMADol (ULTRAM) 50 MG tablet TAKE 1/2 TO 1 TABLET BY MOUTH EVERY 8 HOURS AS NEEDED FOR HIP PAIN  . triamcinolone cream (KENALOG) 0.1 % Apply 1 application topically 2 (two) times daily. Apply to rash on side as needed  . azithromycin (ZITHROMAX Z-PAK) 250 MG tablet Take 2 tablets (500 mg) on  Day 1,  followed by 1 tablet (250 mg) once daily on Days 2 through 5.  . predniSONE (DELTASONE) 10 MG tablet 2 tabs for 4 days.  . [DISCONTINUED] polyethylene glycol (MIRALAX / GLYCOLAX) packet Take 17 g by mouth daily as needed.   No facility-administered encounter medications on file as of 04/06/2017.      Review of Systems  Constitutional:   No  weight loss, night sweats,  Fevers, chills,  +fatigue, or  lassitude.  HEENT:   No headaches,  Difficulty swallowing,  Tooth/dental problems, or  Sore throat,                No sneezing, itching, ear ache, + nasal congestion, post nasal drip,   CV:  No chest pain,  Orthopnea, PND, swelling in lower extremities, anasarca, dizziness, palpitations, syncope.   GI  No heartburn, indigestion, abdominal pain, nausea, vomiting, diarrhea, change in bowel habits, loss of appetite, bloody stools.   Resp:  .  No chest wall deformity  Skin: no rash or lesions.  GU: no dysuria, change in color of urine, no urgency or frequency.  No flank pain, no hematuria   MS:  No joint pain or swelling.  No decreased range of motion.  No back pain.    Physical Exam  BP 126/60 (BP Location: Left Arm, Cuff Size: Normal)   Pulse 60   Ht 5' 2.5" (1.588 m)   Wt 168 lb 12.8 oz (76.6 kg)   SpO2 97%   BMI 30.38 kg/m   GEN: A/Ox3; pleasant , NAD, elderly in wc    HEENT:  [place]/AT,  EACs-clear, TMs-wnl, NOSE-clear, THROAT-clear, no lesions, no postnasal drip or exudate noted.   NECK:  Supple w/ fair ROM; no JVD; normal carotid impulses w/o bruits; no thyromegaly or nodules palpated; no lymphadenopathy.     RESP few trace rhonchi  no accessory muscle use, no dullness to percussion  CARD:  RRR, no m/r/g, 1+ peripheral edema, pulses intact, no cyanosis or clubbing.  GI:   Soft & nt; nml bowel sounds; no organomegaly or masses detected.   Musco: Warm bil, no deformities or joint swelling noted.   Neuro: alert, no focal deficits noted.    Skin: Warm, no lesions or rashes    Lab Results:  CBC    Component Value Date/Time   WBC 13.8 (H) 03/03/2017 1015   WBC 8.5 01/13/2017 1403   RBC 4.30 03/03/2017 1015   RBC 4.05 01/13/2017 1403   HGB 10.2 (L) 03/03/2017 1015   HCT 33.3 (L) 03/03/2017 1015   PLT 467 (H) 03/03/2017 1015  MCV 77 (L) 03/03/2017 1015   MCH 23.7 (L) 03/03/2017 1015   MCH 26.8 06/05/2016 0537   MCHC 30.6 (L) 03/03/2017 1015   MCHC 31.6 01/13/2017 1403   RDW 16.7 (H) 03/03/2017 1015   LYMPHSABS 1.7 03/03/2017 1015   MONOABS 0.7 06/05/2016 0537   EOSABS 0.3 03/03/2017 1015   BASOSABS 0.0 03/03/2017 1015    BMET    Component Value Date/Time   NA 141 03/03/2017 1015   K 4.5 03/03/2017 1015   CL 102 03/03/2017 1015   CO2 22 03/03/2017 1015   GLUCOSE 95 03/03/2017 1015   GLUCOSE 110 (H) 01/13/2017 1403   BUN 19 03/03/2017 1015   CREATININE 1.12 (H) 03/03/2017 1015   CREATININE 1.25 (H) 01/02/2014 1722   CALCIUM 9.1 03/03/2017 1015   CALCIUM 9.3 04/22/2011 0000   GFRNONAA 43 (L) 03/03/2017 1015   GFRNONAA 49 (L) 04/22/2011 0000   GFRAA 50 (L) 03/03/2017 1015   GFRAA 56 (L) 04/22/2011 0000    BNP    Component Value Date/Time   BNP 120.1 (H) 08/11/2016 1439   BNP 176.2 (H) 06/02/2016 1659   BNP 86.4 07/15/2013 1442    ProBNP    Component Value Date/Time   PROBNP 104.0 (H) 02/15/2013 1323    Imaging: Dg Chest 2 View  Result Date: 03/11/2017 CLINICAL DATA:  Follow-up pneumonia. Persistent fatigue, shortness of breath, and cough. History of asthma, former smoker. EXAM: CHEST  2 VIEW COMPARISON:  Chest x-ray of March 03, 2017 and chest CT scan  of March 04, 2017. FINDINGS: The lungs are well-expanded. The interstitial markings remain increased. Areas of confluent airspace opacity in the left upper lobe and right lower lobe have cleared. The cardiac silhouette remains enlarged. The pulmonary vascularity is not engorged. There is calcification in the wall of the aortic arch. There is no pleural effusion. There is multilevel degenerative disc disease of the thoracic spine. IMPRESSION: Interval clearing of alveolar infiltrates bilaterally consistent with response to therapy. Persistently increased underlying interstitial markings bilaterally likely reflect chronic infectious or post inflammatory processes. Thoracic aortic atherosclerosis. Electronically Signed   By: David  Martinique M.D.   On: 03/11/2017 13:18     Assessment & Plan:   Intrinsic asthma Mild flare with URI.  Patient does not appear toxic. For now we will treat with brief course of prednisone, if mucus becomes discolored Z-Pak has been sent in   Plan  Patient Instructions  Zpack to have on hold if symptoms worsen with discolored mucus  Prednisone 60m daily for 4 days, take with food  Mucinex DM Twice daily  As needed  Cough/congestion  Decrease Water intake to 3 bottles (60 oz total ) daily .  Take Lasix 270mdaily for 2 days then As needed  Leg swelling .  Follow up with Dr. SoHalford ChessmanIn 2 weeks and As needed   Please contact office for sooner follow up if symptoms do not improve or worsen or seek emergency care       Acute on chronic diastolic congestive heart failure Does not appear to be acutely overloaded.  However have advised her to decrease her water intake down to more reasonable at 60 ounces daily.  And use Lasix for the next 2 days to prevent any significant volume overload.   Allergic rhinitis Mild flare continue on current regimen     TaRexene EdisonNP 04/06/2017

## 2017-04-06 NOTE — Patient Instructions (Addendum)
Zpack to have on hold if symptoms worsen with discolored mucus  Prednisone 20mg  daily for 4 days, take with food  Mucinex DM Twice daily  As needed  Cough/congestion  Decrease Water intake to 3 bottles (60 oz total ) daily .  Take Lasix 20mg  daily for 2 days then As needed  Leg swelling .  Follow up with Dr. [name]  In 2 weeks and As needed   Please contact office for sooner follow up if symptoms do not improve or worsen or seek emergency care

## 2017-04-06 NOTE — Progress Notes (Signed)
Reviewed and agree with assessment/plan.   Glenetta Kiger, MD [facility] 01/08/2016, 12:24 PM Pager:  [phone]  

## 2017-04-06 NOTE — Assessment & Plan Note (Signed)
Mild flare continue on current regimen

## 2017-04-22 ENCOUNTER — Encounter: Payer: Self-pay | Admitting: Pulmonary Disease

## 2017-04-22 ENCOUNTER — Ambulatory Visit (INDEPENDENT_AMBULATORY_CARE_PROVIDER_SITE_OTHER): Payer: Medicare Other | Admitting: Pulmonary Disease

## 2017-04-22 VITALS — BP 148/76 | HR 90 | Ht 63.0 in | Wt 166.0 lb

## 2017-04-22 DIAGNOSIS — J4530 Mild persistent asthma, uncomplicated: Secondary | ICD-10-CM

## 2017-04-22 DIAGNOSIS — R05 Cough: Secondary | ICD-10-CM

## 2017-04-22 DIAGNOSIS — R058 Other specified cough: Secondary | ICD-10-CM

## 2017-04-22 MED ORDER — FLUTICASONE PROPIONATE 50 MCG/ACT NA SUSP: 1.0000 | Freq: Every day | NASAL | 2 refills | Status: DC

## 2017-04-22 NOTE — Progress Notes (Signed)
[facility]  Chief Complaint  Patient presents with  . Follow-up    Pt has SOB, wheezing, productive cough-clear and feeling more tired than usual. Pt does use OTC mucinex.    Vital signs: BP (!) 148/76 (BP Location: Left Arm, Cuff Size: Normal)   Pulse 90   Ht 5\' 3"  (1.6 m)   Wt 166 lb (75.3 kg)   SpO2 96%   BMI 29.41 kg/m   History of Present Illness: Crystal Kerr is a 84 y.o. female with allergic asthma, and recurrent pneumonia.  Her breathing is improved compared to earlier this year.  Still has sinus congestion, sniffling, and throat irritation with cough.  Has been using mucinex.  Not having fever, hemoptysis, chest pain, wheeze, abdominal pain, or edema.  Physical Exam:  General - pleasant Eyes - pupils reactive ENT - no sinus tenderness, no oral exudate, no LAN Cardiac - regular, no murmur Chest - no wheeze, rales Abd - soft, non tender Ext - no edema Skin - no rashes Neuro - normal strength Psych - normal mood   Assessment/Plan:  Allergic asthma. - continue symbicort, singulair, and prn albuterol  Upper airway cough syndrome with allergic rhinitis. - try using nasal irrigation and flonase - continue singulair  Recurrent pneumonia. - episode in January/February of this year was likely viral pneumonitis and improved after course of steroids  Paroxysmal atrial fibrillation. - monitoring off amiodarone    Patient Instructions  Nasal irrigation (saline nasal spray) daily for next two weeks, then as needed Flonase 1 spray in each nostril daily for two weeks, then as needed  Follow up in 6 months    Chesley Mires, MD [place] 04/22/2017, 12:03 PM  Flow Sheet  Pulmonary tests: PFT 03/19/14 >> FEV1 1.28 (85%), FEV1% 75, TLC 3.43 (72%), DLCO 68%, +BD from FEF 25-75 HRCT chest 03/04/17 >> patchy GGO b/l  Cardiac tests: Echo 02/05/13 >> EF 65 to 70%, grade 2 DD  Past Medical  History: She  has a past medical history of Arthritis, Asthma, Atrial fibrillation ([place]), and PVC's (premature ventricular contractions).  Past Surgical History: She  has a past surgical history that includes Appendectomy; Cholecystectomy; Tonsillectomy; oophrectomy; TEE without cardioversion (N/A, 03/08/2013); Cardioversion (N/A, 03/08/2013); and Transurethral resection of bladder tumor with gyrus (turbt-gyrus) (05/13/2016).  Family History: Her family history includes Diabetes in her father; Heart disease in her unknown relative; Heart failure in her mother.  Social History: She  reports that she quit smoking about 36 years ago. Her smoking use included cigarettes. She has a 20.00 pack-year smoking history. She has never used smokeless tobacco. She reports that she drinks alcohol. She reports that she does not use drugs.  Medications: Allergies as of 04/22/2017   No Known Allergies     Medication List        Accurate as of 04/22/17 12:03 PM. Always use your most recent med list.          acetaminophen 325 MG tablet Commonly known as:  TYLENOL Take 2 tablets (650 mg total) by mouth every 6 (six) hours as needed for mild pain or headache.   albuterol 108 (90 Base) MCG/ACT inhaler Commonly known as:  PROAIR HFA Inhale 2 puffs into the lungs every 4 (four) hours as needed for wheezing or shortness of breath.   apixaban 5 MG Tabs tablet Commonly known as:  ELIQUIS Take 1 tablet (5 mg total) by mouth 2 (two) times daily.   diltiazem  120 MG 24 hr capsule Commonly known as:  TIAZAC TAKE 1 CAPSULE BY MOUTH DAILY   famotidine 20 MG tablet Commonly known as:  PEPCID Take 1 tablet (20 mg total) by mouth at bedtime.   fluticasone 50 MCG/ACT nasal spray Commonly known as:  FLONASE Place 1 spray into both nostrils daily.   furosemide 20 MG tablet Commonly known as:  LASIX Take 20 mg by mouth as needed.   montelukast 10 MG tablet Commonly known as:  SINGULAIR TAKE 1 TABLET BY  MOUTH DAILY   multivitamin with minerals tablet Take 1 tablet by mouth daily.   OVER THE COUNTER MEDICATION [employer] probiotic once daily   pantoprazole 40 MG tablet Commonly known as:  PROTONIX Take 1 tablet (40 mg total) by mouth daily before breakfast.   SYMBICORT 160-4.5 MCG/ACT inhaler Generic drug:  budesonide-formoterol INHALE 2 PUFFS FIRST THING IN THE MORNING, AND 2 MORE IN 12 HOURS   traMADol 50 MG tablet Commonly known as:  ULTRAM TAKE 1/2 TO 1 TABLET BY MOUTH EVERY 8 HOURS AS NEEDED FOR HIP PAIN   triamcinolone cream 0.1 % Commonly known as:  KENALOG Apply 1 application topically 2 (two) times daily. Apply to rash on side as needed

## 2017-04-22 NOTE — Patient Instructions (Signed)
Nasal irrigation (saline nasal spray) daily for next two weeks, then as needed Flonase 1 spray in each nostril daily for two weeks, then as needed  Follow up in 6 months

## 2017-05-13 ENCOUNTER — Other Ambulatory Visit: Payer: Self-pay | Admitting: Cardiology

## 2017-05-13 NOTE — Telephone Encounter (Signed)
REFILL 

## 2017-06-09 ENCOUNTER — Encounter: Payer: Self-pay | Admitting: Family Medicine

## 2017-06-29 NOTE — Progress Notes (Signed)
HPI: FU atrial fibrillation. Patient had episode of atrial fibrillation in February 2015 in the setting of viral gastroenteritis. She had a TEE guided cardioversion and has maintained sinus rhythm on amiodarone. TEE February 2015showed normal LV function, lambl'sexcrescence, mild to moderate left atrial enlargement. At previous office visit she was noted to have increasing dyspnea.  There was a question of pneumonia on chest x-ray and antibiotics were added.  Amiodarone discontinued as well.  High resolution chest CT 2/19 showed extensive patchy groundglass opacities throughout both lungs.  Follow-up high-resolution CT recommended in 6 months. Significant improvement in densities with antibiotics.  Since she was last seen  Current Outpatient Medications  Medication Sig Dispense Refill  . acetaminophen (TYLENOL) 325 MG tablet Take 2 tablets (650 mg total) by mouth every 6 (six) hours as needed for mild pain or headache. 30 tablet 0  . acidophilus (RISAQUAD) CAPS capsule Take 1 capsule by mouth every other day.    . albuterol (PROAIR HFA) 108 (90 Base) MCG/ACT inhaler Inhale 2 puffs into the lungs every 4 (four) hours as needed for wheezing or shortness of breath. 18 g 5  . apixaban (ELIQUIS) 5 MG TABS tablet Take 1 tablet (5 mg total) by mouth 2 (two) times daily. 60 tablet 5  . diltiazem (TIAZAC) 120 MG 24 hr capsule TAKE 1 CAPSULE BY MOUTH DAILY 90 capsule 3  . famotidine (PEPCID) 20 MG tablet Take 1 tablet (20 mg total) by mouth at bedtime. 90 tablet 1  . fluticasone (FLONASE) 50 MCG/ACT nasal spray Place 1 spray into both nostrils daily. 16 g 2  . furosemide (LASIX) 20 MG tablet Take 20 mg by mouth as needed for fluid or edema.     . montelukast (SINGULAIR) 10 MG tablet TAKE 1 TABLET BY MOUTH DAILY 90 tablet 0  . Multiple Vitamins-Minerals (MULTIVITAMIN WITH MINERALS) tablet Take 1 tablet by mouth daily.    Marland Kitchen OVER THE COUNTER MEDICATION [employer] probiotic once daily    . pantoprazole  (PROTONIX) 40 MG tablet Take 1 tablet (40 mg total) by mouth daily before breakfast. 90 tablet 3  . SYMBICORT 160-4.5 MCG/ACT inhaler INHALE 2 PUFFS FIRST THING IN THE MORNING, AND 2 MORE IN 12 HOURS 10.2 g 12  . traMADol (ULTRAM) 50 MG tablet TAKE 1/2 TO 1 TABLET BY MOUTH EVERY 8 HOURS AS NEEDED FOR HIP PAIN 60 tablet 0  . triamcinolone cream (KENALOG) 0.1 % Apply 1 application topically 2 (two) times daily. Apply to rash on side as needed 30 g 1   No current facility-administered medications for this visit.      Past Medical History:  Diagnosis Date  . Arthritis   . Asthma   . Atrial fibrillation ([place])    a. s/p DCCV in 2015, on Amiodarone and Eliquis  . PVC's (premature ventricular contractions)     Past Surgical History:  Procedure Laterality Date  . APPENDECTOMY    . CARDIOVERSION N/A 03/08/2013   Procedure: CARDIOVERSION;  Surgeon: Larey Dresser, MD;  Location: [facility];  Service: Cardiovascular;  Laterality: N/A;  . CHOLECYSTECTOMY    . oophrectomy    . TEE WITHOUT CARDIOVERSION N/A 03/08/2013   Procedure: TRANSESOPHAGEAL ECHOCARDIOGRAM (TEE);  Surgeon: Larey Dresser, MD;  Location: [facility];  Service: Cardiovascular;  Laterality: N/A;  . TONSILLECTOMY    . 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  Marital status: Divorced    Spouse name: Not on file  . Number of children: Not on file  . Years of education: Not on file  . Highest education level: Not on file  Occupational History  . Occupation: Retired    Fish farm manager: RETIRED  Social Needs  . Financial resource strain: Not on file  . Food insecurity:    Worry: Not on file    Inability: Not on file  . Transportation needs:    Medical: Not on file    Non-medical: Not on file  Tobacco Use  . Smoking status: Former Smoker    Packs/day: 1.00    Years: 20.00    Pack years: 20.00    Types: Cigarettes     Last attempt to quit: 04/13/1981    Years since quitting: 36.2  . Smokeless tobacco: Never Used  Substance and Sexual Activity  . Alcohol use: Yes    Comment: Occasional  . Drug use: No  . Sexual activity: Never  Lifestyle  . Physical activity:    Days per week: Not on file    Minutes per session: Not on file  . Stress: Not on file  Relationships  . Social connections:    Talks on phone: Not on file    Gets together: Not on file    Attends religious service: Not on file    Active member of club or organization: Not on file    Attends meetings of clubs or organizations: Not on file    Relationship status: Not on file  . Intimate partner violence:    Fear of current or ex partner: Not on file    Emotionally abused: Not on file    Physically abused: Not on file    Forced sexual activity: Not on file  Other Topics Concern  . Not on file  Social History Narrative  . Not on file    Family History  Problem Relation Age of Onset  . Heart failure Mother   . Diabetes Father   . Heart disease Unknown        No family history    ROS: Some weakness and fatigue but no fevers or chills, productive cough, hemoptysis, dysphasia, odynophagia, melena, hematochezia, dysuria, hematuria, rash, seizure activity, orthopnea, PND, pedal edema, claudication. Remaining systems are negative.  Physical Exam: Well-developed well-nourished in no acute distress.  Skin is warm and dry.  HEENT is normal.  Neck is supple.  Chest with mild expiratory wheeze Cardiovascular exam is regular rate and rhythm.  2/6 systolic murmur left sternal border. Abdominal exam nontender or distended. No masses palpated. Extremities show no edema. neuro grossly intact   A/P  1 paroxysmal atrial fibrillation-patient remains in sinus rhythm on examination today.  Her amiodarone was previously discontinued because of question pulmonary toxicity.  However this process resolved quickly with antibiotic therapy and was more  likely related to pneumonia.  We will continue off of amiodarone for now.  Continue apixaban.  Continue Cardizem for rate control if atrial fibrillation recurs.   2 chronic diastolic congestive heart failure-plan to continue present dose of diuretic.    3 hypertension-blood pressure is borderline.  I have asked her to follow this at home and we will advance regimen as needed.   4 dyspnea-patient continues to have some dyspnea but improved compared to previous.  She did have a CT scan which showed patchy groundglass opacities and follow-up recommended in 6 months which we will arrange 8/19.  I think her previous  dyspnea was more likely secondary to pneumonia and less likely secondary to amiodarone.  Kirk Ruths, MD

## 2017-06-30 ENCOUNTER — Encounter: Payer: Self-pay | Admitting: Cardiology

## 2017-06-30 ENCOUNTER — Ambulatory Visit (INDEPENDENT_AMBULATORY_CARE_PROVIDER_SITE_OTHER): Payer: Medicare Other | Admitting: Cardiology

## 2017-06-30 VITALS — BP 150/58 | HR 59 | Ht 63.0 in | Wt 171.0 lb

## 2017-06-30 DIAGNOSIS — R0600 Dyspnea, unspecified: Secondary | ICD-10-CM

## 2017-06-30 DIAGNOSIS — R918 Other nonspecific abnormal finding of lung field: Secondary | ICD-10-CM | POA: Diagnosis not present

## 2017-06-30 DIAGNOSIS — I480 Paroxysmal atrial fibrillation: Secondary | ICD-10-CM | POA: Diagnosis not present

## 2017-06-30 DIAGNOSIS — I10 Essential (primary) hypertension: Secondary | ICD-10-CM

## 2017-06-30 NOTE — Patient Instructions (Signed)
Medication Instructions:  Your physician recommends that you continue on your current medications as directed. Please refer to the Current Medication list given to you today.  Labwork: -None  Testing/Procedures: Non-Cardiac CT scanning, (CAT scanning), is a noninvasive, special x-ray that produces cross-sectional images of the body using x-rays and a computer. CT scans help physicians diagnose and treat medical conditions. For some CT exams, a contrast material is used to enhance visibility in the area of the body being studied. CT scans provide greater clarity and reveal more details than regular x-ray exams.    Follow-Up: Your physician wants you to follow-up in: 6 months with [name]. You will receive a reminder letter in the mail two months in advance. If you don't receive a letter, please call our office to schedule the follow-up appointment.   Any Other Special Instructions Will Be Listed Below (If Applicable).     If you need a refill on your cardiac medications before your next appointment, please call your pharmacy.  2

## 2017-07-01 ENCOUNTER — Other Ambulatory Visit: Payer: Self-pay | Admitting: Family Medicine

## 2017-07-03 ENCOUNTER — Emergency Department (HOSPITAL_BASED_OUTPATIENT_CLINIC_OR_DEPARTMENT_OTHER)
Admission: EM | Admit: 2017-07-03 | Discharge: 2017-07-03 | Disposition: A | Discharge status: Home / Self Care | Payer: Medicare Other | Attending: Emergency Medicine | Admitting: Emergency Medicine

## 2017-07-03 ENCOUNTER — Encounter (HOSPITAL_BASED_OUTPATIENT_CLINIC_OR_DEPARTMENT_OTHER): Payer: Self-pay | Admitting: Emergency Medicine

## 2017-07-03 ENCOUNTER — Other Ambulatory Visit: Payer: Self-pay

## 2017-07-03 DIAGNOSIS — R0602 Shortness of breath: Secondary | ICD-10-CM | POA: Diagnosis not present

## 2017-07-03 DIAGNOSIS — J449 Chronic obstructive pulmonary disease, unspecified: Secondary | ICD-10-CM | POA: Insufficient documentation

## 2017-07-03 DIAGNOSIS — I480 Paroxysmal atrial fibrillation: Secondary | ICD-10-CM | POA: Diagnosis not present

## 2017-07-03 DIAGNOSIS — R52 Pain, unspecified: Secondary | ICD-10-CM | POA: Diagnosis not present

## 2017-07-03 DIAGNOSIS — I5032 Chronic diastolic (congestive) heart failure: Secondary | ICD-10-CM | POA: Insufficient documentation

## 2017-07-03 DIAGNOSIS — R100 Acute abdomen: Secondary | ICD-10-CM | POA: Diagnosis not present

## 2017-07-03 DIAGNOSIS — R61 Generalized hyperhidrosis: Secondary | ICD-10-CM | POA: Diagnosis not present

## 2017-07-03 DIAGNOSIS — Z79899 Other long term (current) drug therapy: Secondary | ICD-10-CM | POA: Insufficient documentation

## 2017-07-03 DIAGNOSIS — Z7901 Long term (current) use of anticoagulants: Secondary | ICD-10-CM | POA: Insufficient documentation

## 2017-07-03 DIAGNOSIS — R55 Syncope and collapse: Secondary | ICD-10-CM

## 2017-07-03 DIAGNOSIS — R1084 Generalized abdominal pain: Secondary | ICD-10-CM | POA: Diagnosis not present

## 2017-07-03 DIAGNOSIS — Z87891 Personal history of nicotine dependence: Secondary | ICD-10-CM | POA: Diagnosis not present

## 2017-07-03 DIAGNOSIS — R1310 Dysphagia, unspecified: Secondary | ICD-10-CM | POA: Diagnosis not present

## 2017-07-03 HISTORY — DX: Chronic obstructive pulmonary disease, unspecified: J44.9

## 2017-07-03 LAB — TROPONIN I
Troponin I: <0.03 ng/mL (ref <0.03)
Troponin I: <0.03 ng/mL (ref <0.03)

## 2017-07-03 LAB — CBC
HCT: 30.8 % — ABNORMAL LOW (ref 36.0–46.0)
Hemoglobin: 9.6 g/dL — ABNORMAL LOW (ref 12.0–15.0)
MCH: 23.2 pg — ABNORMAL LOW (ref 26.0–34.0)
MCHC: 31.2 g/dL (ref 30.0–36.0)
MCV: 74.6 fL — ABNORMAL LOW (ref 78.0–100.0)
PLATELETS: 306 10*3/uL (ref 150–400)
RBC: 4.13 MIL/uL (ref 3.87–5.11)
RDW: 18.2 % — AB (ref 11.5–15.5)
WBC: 8.5 10*3/uL (ref 4.0–10.5)

## 2017-07-03 LAB — URINALYSIS, MICROSCOPIC (REFLEX): RBC / HPF: NONE SEEN RBC/hpf (ref 0–5)

## 2017-07-03 LAB — COMPREHENSIVE METABOLIC PANEL
ALT: 17 U/L (ref 14–54)
AST: 26 U/L (ref 15–41)
Albumin: 3.5 g/dL (ref 3.5–5.0)
Alkaline Phosphatase: 103 U/L (ref 38–126)
Anion gap: 9 (ref 5–15)
BILIRUBIN TOTAL: 0.4 mg/dL (ref 0.3–1.2)
BUN: 32 mg/dL — AB (ref 6–20)
CO2: 26 mmol/L (ref 22–32)
CREATININE: 1.43 mg/dL — AB (ref 0.44–1.00)
Calcium: 8.9 mg/dL (ref 8.9–10.3)
Chloride: 105 mmol/L (ref 101–111)
GFR calc Af Amer: 36 mL/min — ABNORMAL LOW (ref >60)
GFR, EST NON AFRICAN AMERICAN: 31 mL/min — AB (ref >60)
Glucose, Bld: 131 mg/dL — ABNORMAL HIGH (ref 65–99)
POTASSIUM: 3.9 mmol/L (ref 3.5–5.1)
Sodium: 140 mmol/L (ref 135–145)
TOTAL PROTEIN: 6.8 g/dL (ref 6.5–8.1)

## 2017-07-03 LAB — URINALYSIS, ROUTINE W REFLEX MICROSCOPIC
Bilirubin Urine: NEGATIVE
Glucose, UA: NEGATIVE mg/dL
Hgb urine dipstick: NEGATIVE
KETONES UR: NEGATIVE mg/dL
NITRITE: NEGATIVE
PROTEIN: NEGATIVE mg/dL
Specific Gravity, Urine: 1.015 (ref 1.005–1.030)
pH: 6 (ref 5.0–8.0)

## 2017-07-03 LAB — LIPASE, BLOOD: Lipase: 38 U/L (ref 11–51)

## 2017-07-03 NOTE — ED Notes (Signed)
Given ginger ale 

## 2017-07-03 NOTE — ED Notes (Signed)
ED Provider at bedside. 

## 2017-07-03 NOTE — ED Notes (Signed)
Pt on cardiac monitor and auto VS 

## 2017-07-03 NOTE — ED Provider Notes (Signed)
[facility] EMERGENCY DEPARTMENT Provider Note   CSN: [id number] Arrival date & time: 07/03/17  1028     History   Chief Complaint Chief Complaint  Patient presents with  . Abdominal Pain    HPI Crystal Kerr is a 84 y.o. female.  HPI  84 year old female presents with near syncope.  She has a history of prior A. fib was a history of CHF and COPD.  She is had PVCs before as well.  She states that shortly after breakfast today, she was going to the car and was feeling weak and a little lightheaded.  This progressively worsened to the point that she needed to urgently have a bowel movement at home.  She had a soft formed stool but became more more lightheaded.  She did not feel palpitations or chest pain but was a little short of breath.  No headache.  Some nausea.  She was having significant abdominal cramping.  She did not actually pass out but became close.  Symptoms have now resolved.  Daughter was checking on the patient and states that she was having an irregular but not fast pulse.  Past Medical History:  Diagnosis Date  . Arthritis   . Asthma   . Atrial fibrillation ([place])    a. s/p DCCV in 2015, on Amiodarone and Eliquis  . CHF (congestive heart failure) ([place])   . COPD (chronic obstructive pulmonary disease) ([place])   . PVC's (premature ventricular contractions)     Patient Active Problem List   Diagnosis Date Noted  . CAP (community acquired pneumonia) 03/04/2017  . Current use of long term anticoagulation 07/31/2016  . Atypical pneumonia 06/02/2016  . AF (paroxysmal atrial fibrillation) ([place]) 06/02/2016  . Lower extremity edema 12/19/2013  . 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 Benign paroxysmal positional vertigo 09/13/2013  . Upper airway cough syndrome 07/10/2013  . Allergic rhinitis 07/05/2013  . Persistent atrial fibrillation ([place]) 03/30/2013  . Shortness of breath 02/06/2013  .  Acute on chronic diastolic congestive heart failure ([place]) 02/04/2013  . Arthritis of spine 07/01/2012  . Intrinsic asthma 07/01/2012  . Dyspnea 04/12/2012  . PVC's (premature ventricular contractions) 04/24/2011  . Murmur, cardiac 04/24/2011    Past Surgical History:  Procedure Laterality Date  . APPENDECTOMY    . CARDIOVERSION N/A 03/08/2013   Procedure: CARDIOVERSION;  Surgeon: Larey Dresser, MD;  Location: [facility];  Service: Cardiovascular;  Laterality: N/A;  . CHOLECYSTECTOMY    . oophrectomy    . TEE WITHOUT CARDIOVERSION N/A 03/08/2013   Procedure: TRANSESOPHAGEAL ECHOCARDIOGRAM (TEE);  Surgeon: Larey Dresser, MD;  Location: [facility];  Service: Cardiovascular;  Laterality: N/A;  . TONSILLECTOMY    . TRANSURETHRAL RESECTION OF BLADDER TUMOR WITH GYRUS (TURBT-GYRUS)  05/13/2016   urology wiht [facility]     OB History   None      Home Medications    Prior to Admission medications   Medication Sig Start Date End Date Taking? Authorizing Provider  acetaminophen (TYLENOL) 325 MG tablet Take 2 tablets (650 mg total) by mouth every 6 (six) hours as needed for mild pain or headache. 06/05/16   Raiford Noble Latif, DO  acidophilus (RISAQUAD) CAPS capsule Take 1 capsule by mouth every other day.    [provider]  albuterol (PROAIR HFA) 108 (90 Base) MCG/ACT inhaler Inhale 2 puffs into the lungs every 4 (four) hours as needed for wheezing or shortness of  breath. 02/04/17   Copland, Gay Filler, MD  apixaban (ELIQUIS) 5 MG TABS tablet Take 1 tablet (5 mg total) by mouth 2 (two) times daily. 09/08/16   Wellington Hampshire, MD  diltiazem (TIAZAC) 120 MG 24 hr capsule TAKE 1 CAPSULE BY MOUTH DAILY 05/13/17   Lelon Perla, MD  famotidine (PEPCID) 20 MG tablet Take 1 tablet (20 mg total) by mouth at bedtime. 01/01/17   Copland, Gay Filler, MD  fluticasone (FLONASE) 50 MCG/ACT nasal spray Place 1 spray into both nostrils daily. 04/22/17   Chesley Mires, MD  furosemide (LASIX) 20 MG  tablet Take 20 mg by mouth as needed for fluid or edema.     [provider]  montelukast (SINGULAIR) 10 MG tablet TAKE 1 TABLET BY MOUTH DAILY 04/02/17   Copland, Gay Filler, MD  Multiple Vitamins-Minerals (MULTIVITAMIN WITH MINERALS) tablet Take 1 tablet by mouth daily.    [provider]  OVER THE COUNTER MEDICATION Her[employer] probiotic once daily    [provider]  pantoprazole (PROTONIX) 40 MG tablet Take 1 tablet (40 mg total) by mouth daily before breakfast. 09/18/16   Copland, Gay Filler, MD  SYMBICORT 160-4.5 MCG/ACT inhaler INHALE 2 PUFFS FIRST THING IN THE MORNING, AND 2 MORE IN 12 HOURS 07/04/16   Copland, Gay Filler, MD  traMADol (ULTRAM) 50 MG tablet TAKE 1/2 TO 1 TABLET BY MOUTH EVERY 8 HOURS AS NEEDED FOR HIP PAIN 03/10/17   Copland, Gay Filler, MD  triamcinolone cream (KENALOG) 0.1 % Apply 1 application topically 2 (two) times daily. Apply to rash on side as needed 12/25/15   Copland, Gay Filler, MD    Family History Family History  Problem Relation Age of Onset  . Heart failure Mother   . Diabetes Father   . Heart disease Unknown        No family history    Social History Social History   Tobacco Use  . Smoking status: Former Smoker    Packs/day: 1.00    Years: 20.00    Pack years: 20.00    Types: Cigarettes    Last attempt to quit: 04/13/1981    Years since quitting: 36.2  . Smokeless tobacco: Never Used  Substance Use Topics  . Alcohol use: Yes    Comment: Occasional  . Drug use: No     Allergies   Patient has no known allergies.   Review of Systems Review of Systems  Constitutional: Positive for diaphoresis. Negative for fever.  Respiratory: Positive for shortness of breath.   Cardiovascular: Negative for chest pain and palpitations.  Gastrointestinal: Positive for abdominal pain and nausea. Negative for diarrhea and vomiting.  Neurological: Positive for light-headedness. Negative for syncope.  All other systems reviewed and are  negative.    Physical Exam Updated Vital Signs BP (!) 137/48   Pulse (!) 59   Temp 97.8 F (36.6 C) (Oral)   Resp 19   Ht 5\' 3"  (1.6 m)   Wt 77.1 kg (170 lb)   SpO2 97%   BMI 30.11 kg/m   Physical Exam  Constitutional: She appears well-developed and well-nourished.  Non-toxic appearance. She does not appear ill. No distress.  HENT:  Head: Normocephalic and atraumatic.  Right Ear: External ear normal.  Left Ear: External ear normal.  Nose: Nose normal.  Eyes: Right eye exhibits no discharge. Left eye exhibits no discharge.  Cardiovascular: Regular rhythm and normal heart sounds. Bradycardia present.  Pulmonary/Chest: Effort normal and breath sounds normal.  Abdominal:  Soft. There is no tenderness.  Neurological: She is alert.  Skin: Skin is warm and dry.  Nursing note and vitals reviewed.    ED Treatments / Results  Labs (all labs ordered are listed, but only abnormal results are displayed) Labs Reviewed  COMPREHENSIVE METABOLIC PANEL - Abnormal; Notable for the following components:      Result Value   Glucose, Bld 131 (*)    BUN 32 (*)    Creatinine, Ser 1.43 (*)    GFR calc non Af Amer 31 (*)    GFR calc Af Amer 36 (*)    All other components within normal limits  CBC - Abnormal; Notable for the following components:   Hemoglobin 9.6 (*)    HCT 30.8 (*)    MCV 74.6 (*)    MCH 23.2 (*)    RDW 18.2 (*)    All other components within normal limits  URINALYSIS, ROUTINE W REFLEX MICROSCOPIC - Abnormal; Notable for the following components:   Leukocytes, UA SMALL (*)    All other components within normal limits  URINALYSIS, MICROSCOPIC (REFLEX) - Abnormal; Notable for the following components:   Bacteria, UA MANY (*)    All other components within normal limits  LIPASE, BLOOD  TROPONIN I  TROPONIN I    EKG EKG Interpretation  Date/Time:  Saturday July 03 2017 14:11:16 EDT Ventricular Rate:  59 PR Interval:    QRS Duration: 90 QT Interval:  468 QTC  Calculation: 464 R Axis:   14 Text Interpretation:  Sinus rhythm Prolonged PR interval no significant change since earlier in the day Confirmed by Sherwood Gambler [phone]) on 07/03/2017 2:58:50 PM   Radiology No results found.  Procedures Procedures (including critical care time)  Medications Ordered in ED Medications - No data to display   Initial Impression / Assessment and Plan / ED Course  I have reviewed the triage vital signs and the nursing notes.  Pertinent labs & imaging results that were available during my care of the patient were reviewed by me and considered in my medical decision making (see chart for details).     Patient is well-appearing and has no acute complaints currently.  The episode sounds like a near syncopal episode although is somewhat prolonged.. Given the family member felt like there was an irregular pulse combined with her age and history of CHF, I discussed concern for possible cardiac arrhythmia.  I have recommended observation admission for further work-up and monitoring.  However the patient declines.  She states given her age she would not want any significant changes to her meds or treatments such as pacemaker/defibrillator if needed.  She understands that if this was an arrhythmia and it returns it could kill her.  Given she also had transient shortness of breath, a second troponin was obtained and is negative.  ECG does not appear ischemic.  I think this is a reasonable decision for the patient and she will go home with family.  Discussed return precautions.  As for her abdominal exam there is no current tenderness or other symptoms that would be concerning for an acute intra-abdominal process and I think this is more related to the near syncope.  Final Clinical Impressions(s) / ED Diagnoses   Final diagnoses:  Near syncope    ED Discharge Orders    None       Sherwood Gambler, MD 07/03/17 1510

## 2017-07-03 NOTE — ED Triage Notes (Addendum)
Pt reports weakness and abd cramping after eating breakfast this morning. Pt states she felt like she was going to pass out. She reports she broke out in a sweat, had a large bowl movement. Pt states she feels better now.

## 2017-07-07 ENCOUNTER — Encounter: Payer: Self-pay | Admitting: Family Medicine

## 2017-07-07 ENCOUNTER — Ambulatory Visit (INDEPENDENT_AMBULATORY_CARE_PROVIDER_SITE_OTHER): Payer: Medicare Other | Admitting: Family Medicine

## 2017-07-07 VITALS — BP 124/74 | HR 57 | Resp 16 | Ht 63.0 in | Wt 171.0 lb

## 2017-07-07 DIAGNOSIS — R350 Frequency of micturition: Secondary | ICD-10-CM | POA: Diagnosis not present

## 2017-07-07 DIAGNOSIS — Z09 Encounter for follow-up examination after completed treatment for conditions other than malignant neoplasm: Secondary | ICD-10-CM | POA: Diagnosis not present

## 2017-07-07 DIAGNOSIS — R82998 Other abnormal findings in urine: Secondary | ICD-10-CM | POA: Diagnosis not present

## 2017-07-07 MED ORDER — MIRABEGRON ER 25 MG PO TB24: 25.0000 mg | ORAL_TABLET | Freq: Every day | ORAL | 6 refills | Status: DC

## 2017-07-07 NOTE — Progress Notes (Signed)
[facility] at [facility] [address] [place], [place] [address] [phone]  Date:  07/07/2017   Name:  Crystal Kerr   DOB:  May 07, 1924   MRN:  [id number]  PCP:  [name], MD    Chief Complaint: ER follow up (near syncope, last saturday, normal ekg and lab work)   History of Present Illness:  Crystal Kerr is a 84 y.o. very pleasant female patient who presents with the following:  Following up from the ED today- she was seen on 6/22 with a near syncopal episode: 84 year old female presents with near syncope.  She has a history of prior A. fib was a history of CHF and COPD.  She is had PVCs before as well.  She states that shortly after breakfast today, she was going to the car and was feeling weak and a little lightheaded.  This progressively worsened to the point that she needed to urgently have a bowel movement at home.  She had a soft formed stool but became more more lightheaded.  She did not feel palpitations or chest pain but was a little short of breath.  No headache.  Some nausea.  She was having significant abdominal cramping.  She did not actually pass out but became close.  Symptoms have now resolved.  Daughter was checking on the patient and states that she was having an irregular but not fast pulse.//////////////////////////////////////////////////// Patient is well-appearing and has no acute complaints currently.  The episode sounds like a near syncopal episode although is somewhat prolonged.. Given the family member felt like there was an irregular pulse combined with her age and history of CHF, I discussed concern for possible cardiac arrhythmia.  I have recommended observation admission for further work-up and monitoring.  However the patient declines.  She states given her age she would not want any significant changes to her meds or treatments such as pacemaker/defibrillator if needed.  She  understands that if this was an arrhythmia and it returns it could kill her.  Given she also had transient shortness of breath, a second troponin was obtained and is negative.  ECG does not appear ischemic.  I think this is a reasonable decision for the patient and she will go home with family.  Discussed return precautions.  As for her abdominal exam there is no current tenderness or other symptoms that would be concerning for an acute intra-abdominal process and I think this is more related to the near syncope.  Also see cardiology, [name]-  note from earlier this month: 1 paroxysmal atrial fibrillation-patient remains in sinus rhythm on examination today.  Her amiodarone was previously discontinued because of question pulmonary toxicity.  However this process resolved quickly with antibiotic therapy and was more likely related to pneumonia.  We will continue off of amiodarone for now.  Continue apixaban.  Continue Cardizem for rate control if atrial fibrillation recurs.  2 chronic diastolic congestive heart failure-plan to continue present dose of diuretic.   3 hypertension-blood pressure is borderline.  I have asked her to follow this at home and we will advance regimen as needed.  4 dyspnea-patient continues to have some dyspnea but improved compared to previous.  She did have a CT scan which showed patchy groundglass opacities and follow-up recommended in 6 months which we will arrange 8/19.  I think her previous dyspnea was more likely secondary to pneumonia and less likely secondary to amiodarone.  Pt reports that this past Saturday she was driving home from breakfast when she noted that she felt lightheaded.  They went home and she had a BM and felt worse . Her daughter called EMS and they took her to the ER where she was evaluated as above.   Crystal Kerr is now feeling just fine, her sx have not come back.  She is here with her daughter Crystal Kerr- they live together.  Crystal Kerr is still a bit  worried, but Crystal Kerr feels that at her age she does not wish to have any extensive work-up and realizes that she will not live forever- for the time being she feels good and is at peace.  She wants to [employer] that she has been an excellent caretaker and has done everything for her that anyone could expect  She is not having to use her prn lasix, her ankles are not swollen   She does have urinary urgency- this is not a new thing, but is getting gradually worse.  She may have a hard time making it to the bathroom on time.  She does wear a pad all the time Noted some white cells in her urine in the ER- they did not do a culture.  Will obtain a culture here.   Patient Active Problem List   Diagnosis Date Noted  . CAP (community acquired pneumonia) 03/04/2017  . Current use of long term anticoagulation 07/31/2016  . Atypical pneumonia 06/02/2016  . AF (paroxysmal atrial fibrillation) ([place]) 06/02/2016  . Lower extremity edema 12/19/2013  . 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 Benign paroxysmal positional vertigo 09/13/2013  . Upper airway cough syndrome 07/10/2013  . Allergic rhinitis 07/05/2013  . Persistent atrial fibrillation ([place]) 03/30/2013  . Shortness of breath 02/06/2013  . Acute on chronic diastolic congestive heart failure ([place]) 02/04/2013  . Arthritis of spine 07/01/2012  . Intrinsic asthma 07/01/2012  . Dyspnea 04/12/2012  . PVC's (premature ventricular contractions) 04/24/2011  . Murmur, cardiac 04/24/2011    Past Medical History:  Diagnosis Date  . Arthritis   . Asthma   . Atrial fibrillation ([place])    a. s/p DCCV in 2015, on Amiodarone and Eliquis  . CHF (congestive heart failure) ([place])   . COPD (chronic obstructive pulmonary disease) ([place])   . PVC's (premature ventricular contractions)     Past Surgical History:  Procedure Laterality Date  . APPENDECTOMY    . CARDIOVERSION N/A 03/08/2013    Procedure: CARDIOVERSION;  Surgeon: Larey Dresser, MD;  Location: [facility];  Service: Cardiovascular;  Laterality: N/A;  . CHOLECYSTECTOMY    . oophrectomy    . TEE WITHOUT CARDIOVERSION N/A 03/08/2013   Procedure: TRANSESOPHAGEAL ECHOCARDIOGRAM (TEE);  Surgeon: Larey Dresser, MD;  Location: [facility];  Service: Cardiovascular;  Laterality: N/A;  . TONSILLECTOMY    . TRANSURETHRAL RESECTION OF BLADDER TUMOR WITH GYRUS (TURBT-GYRUS)  05/13/2016   urology wiht [facility]    Social History   Tobacco Use  . Smoking status: Former Smoker    Packs/day: 1.00    Years: 20.00    Pack years: 20.00    Types: Cigarettes    Last attempt to quit: 04/13/1981    Years since quitting: 36.2  . Smokeless tobacco: Never Used  Substance Use Topics  . Alcohol use: Yes    Comment: Occasional  . Drug use: No    Family History  Problem Relation Age of Onset  .  Heart failure Mother   . Diabetes Father   . Heart disease Unknown        No family history    No Known Allergies  Medication list has been reviewed and updated.  Current Outpatient Medications on File Prior to Visit  Medication Sig Dispense Refill  . acetaminophen (TYLENOL) 325 MG tablet Take 2 tablets (650 mg total) by mouth every 6 (six) hours as needed for mild pain or headache. 30 tablet 0  . acidophilus (RISAQUAD) CAPS capsule Take 1 capsule by mouth every other day.    . albuterol (PROAIR HFA) 108 (90 Base) MCG/ACT inhaler Inhale 2 puffs into the lungs every 4 (four) hours as needed for wheezing or shortness of breath. 18 g 5  . apixaban (ELIQUIS) 5 MG TABS tablet Take 1 tablet (5 mg total) by mouth 2 (two) times daily. 60 tablet 5  . diltiazem (TIAZAC) 120 MG 24 hr capsule TAKE 1 CAPSULE BY MOUTH DAILY 90 capsule 3  . famotidine (PEPCID) 20 MG tablet Take 1 tablet (20 mg total) by mouth at bedtime. 90 tablet 1  . fluticasone (FLONASE) 50 MCG/ACT nasal spray Place 1 spray into both nostrils daily. 16 g 2  . furosemide (LASIX) 20  MG tablet Take 20 mg by mouth as needed for fluid or edema.     . montelukast (SINGULAIR) 10 MG tablet TAKE 1 TABLET BY MOUTH DAILY 90 tablet 0  . Multiple Vitamins-Minerals (MULTIVITAMIN WITH MINERALS) tablet Take 1 tablet by mouth daily.    Marland Kitchen OVER THE COUNTER MEDICATION Her[employer] probiotic once daily    . pantoprazole (PROTONIX) 40 MG tablet Take 1 tablet (40 mg total) by mouth daily before breakfast. 90 tablet 3  . SYMBICORT 160-4.5 MCG/ACT inhaler INHALE 2 PUFFS FIRST THING IN THE MORNING, AND 2 MORE IN 12 HOURS 10.2 g 12  . traMADol (ULTRAM) 50 MG tablet TAKE 1/2 TO 1 TABLET BY MOUTH EVERY 8 HOURS AS NEEDED FOR HIP PAIN 60 tablet 0  . triamcinolone cream (KENALOG) 0.1 % Apply 1 application topically 2 (two) times daily. Apply to rash on side as needed 30 g 1   No current facility-administered medications on file prior to visit.     Review of Systems:  As per HPI- otherwise negative.   Physical Examination: Vitals:   07/07/17 1545  BP: 124/74  Pulse: (!) 57  Resp: 16  SpO2: 97%   Vitals:   07/07/17 1545  Weight: 171 lb (77.6 kg)  Height: 5\' 3"  (1.6 m)   Body mass index is 30.29 kg/m. Ideal Body Weight: Weight in (lb) to have BMI = 25: 140.8  Crystal Kerr: WDWN, NAD, Non-toxic, A & O x 3, well appearing older lady sitting in WC today.  She is quite mentally sharp HEENT: Atraumatic, Normocephalic. Neck supple. No masses, No LAD.  Oropharynx wnl Ears and Nose: No external deformity. CV: RRR, No M/G/R. No JVD. No thrill. No extra heart sounds. PULM: CTA B, no wheezes, crackles, rhonchi. No retractions. No resp. distress. No accessory muscle use. ABD: S, NT, ND. No rebound. No HSM. EXTR: No c/c/e NEURO Normal gait.  PSYCH: Normally interactive. Conversant. Not depressed or anxious appearing.  Calm demeanor.    Assessment and Plan: Hospital discharge follow-up  Urine white blood cells increased - Plan: Urine Culture  Urinary frequency - Plan: mirabegron ER (MYRBETRIQ) 25 MG  TB24 tablet  Following up from ER visit today She reports feeling well.  Recommended that we repeat BMP and CBC  today- Crystal Kerr prefers to wait until next visit as her blood is hard to draw and she is tired of being stuck  Will obtain UCx- they plan to collect at home Will have her try Myrbetriq for urinary frequency  Asked them to come and see me in 4 months  Signed Lamar Blinks, MD

## 2017-07-07 NOTE — Patient Instructions (Addendum)
It was good to see you today- let's check your urine for any sign of infection- you can bring [place] a urine sample at your convenience We will also try Myrbetriq for you- take this once a day, if helpful you can continue to use it   Please come and see me in about 4 months for a recheck and labs  Let me know if any sign of your symptoms coming back

## 2017-07-08 ENCOUNTER — Other Ambulatory Visit: Payer: Medicare Other

## 2017-07-08 DIAGNOSIS — R82998 Other abnormal findings in urine: Secondary | ICD-10-CM | POA: Diagnosis not present

## 2017-07-09 LAB — URINE CULTURE
MICRO NUMBER:: 90769417
RESULT: NO GROWTH
SPECIMEN QUALITY: ADEQUATE

## 2017-07-11 ENCOUNTER — Encounter: Payer: Self-pay | Admitting: Family Medicine

## 2017-07-31 ENCOUNTER — Other Ambulatory Visit: Payer: Self-pay | Admitting: Family Medicine

## 2017-08-13 ENCOUNTER — Other Ambulatory Visit: Payer: Self-pay | Admitting: Student

## 2017-08-13 ENCOUNTER — Telehealth: Payer: Self-pay | Admitting: Cardiology

## 2017-08-13 NOTE — Telephone Encounter (Signed)
New Message   Pt c/o swelling: STAT is pt has developed SOB within 24 hours  1) How much weight have you gained and in what time span? hasnt weighted herself  2) If swelling, where is the swelling located? Lower legs and ankles  3) Are you currently taking a fluid pill? yes  4) Are you currently SOB? She has copd and fatigued  5) Do you have a log of your daily weights (if so, list)? no  6) Have you gained 3 pounds in a day or 5 pounds in a week? Not sure  7) Have you traveled recently? yes

## 2017-08-13 NOTE — Telephone Encounter (Signed)
Called patient, spoke with daughter who states that patient has been having leg and ankle swelling for the past couple of days, SOB and fatigue. She has taken 3 days of Lasix (20mg ) in the morning but no changes have been made in swelling. Daughter states that she will begin weighing herself, as she has not done so. I spoke with DOD Dr.Harding who stated to double up on the lasix for the weekend and to be seen next week by APP. Daughter verbalized understanding, appointment made with Jory Sims on 08/19/17 at 11:00. I was also advised by DOD that if swelling worsened along with SOB to go to the ER. Patient WILL need lab check to check on kidney function.

## 2017-08-17 ENCOUNTER — Ambulatory Visit (HOSPITAL_BASED_OUTPATIENT_CLINIC_OR_DEPARTMENT_OTHER)
Admission: RE | Admit: 2017-08-17 | Discharge: 2017-08-17 | Disposition: A | Discharge status: Home / Self Care | Payer: Medicare Other | Source: Ambulatory Visit | Attending: Cardiology | Admitting: Cardiology

## 2017-08-17 DIAGNOSIS — I700 Atherosclerosis of aorta: Secondary | ICD-10-CM | POA: Diagnosis not present

## 2017-08-17 DIAGNOSIS — R0602 Shortness of breath: Secondary | ICD-10-CM | POA: Diagnosis not present

## 2017-08-17 DIAGNOSIS — R918 Other nonspecific abnormal finding of lung field: Secondary | ICD-10-CM | POA: Insufficient documentation

## 2017-08-17 DIAGNOSIS — I2510 Atherosclerotic heart disease of native coronary artery without angina pectoris: Secondary | ICD-10-CM | POA: Insufficient documentation

## 2017-08-18 ENCOUNTER — Encounter: Payer: Self-pay | Admitting: Family Medicine

## 2017-08-18 ENCOUNTER — Ambulatory Visit (INDEPENDENT_AMBULATORY_CARE_PROVIDER_SITE_OTHER): Payer: Medicare Other | Admitting: Family Medicine

## 2017-08-18 VITALS — BP 130/62 | HR 73 | Temp 97.9°F | Ht 63.0 in | Wt 171.4 lb

## 2017-08-18 DIAGNOSIS — J4541 Moderate persistent asthma with (acute) exacerbation: Secondary | ICD-10-CM | POA: Diagnosis not present

## 2017-08-18 MED ORDER — FLUTICASONE PROPIONATE HFA 110 MCG/ACT IN AERO: INHALATION_SPRAY | RESPIRATORY_TRACT | 0 refills | Status: DC

## 2017-08-18 MED ORDER — ALBUTEROL SULFATE HFA 108 (90 BASE) MCG/ACT IN AERS: 2.0000 | INHALATION_SPRAY | RESPIRATORY_TRACT | 5 refills | Status: DC | PRN

## 2017-08-18 NOTE — Progress Notes (Signed)
Chief Complaint  Patient presents with  . Shortness of Breath  . Leg Swelling    Crystal Kerr here for bilateral leg swelling. Here w daughter.   Duration: 1 week SOB? Yes  +hx of CHF, seasonal allergies and asthma No wheezing, diet changes, chest pain, cough, URI s/s's. No calf pain.  Cardiology increased dose of Lasix and she feels some improvement.   ROS:  MSK- +leg swelling, no pain Lungs- + SOB  Past Medical History:  Diagnosis Date  . Arthritis   . Asthma   . Atrial fibrillation ([place])    a. s/p DCCV in 2015, on Amiodarone and Eliquis  . CHF (congestive heart failure) ([place])   . COPD (chronic obstructive pulmonary disease) ([place])   . PVC's (premature ventricular contractions)    Family History  Problem Relation Age of Onset  . Heart failure Mother   . Diabetes Father   . Heart disease Unknown        No family history    BP 130/62 (BP Location: Left Arm, Patient Position: Sitting, Cuff Size: Normal)   Pulse 73   Temp 97.9 F (36.6 C) (Oral)   Ht 5\' 3"  (1.6 m)   Wt 171 lb 6 oz (77.7 kg)   SpO2 95%   BMI 30.36 kg/m  Gen- awake, alert, appears stated age Heart- RRR, no murmurs, 1+ nonpitting LE edema b/l, no JVD Lungs- CTAB, normal effort w/o accessory muscle use, no rales MSK- no calf pain Psych: Age appropriate judgment and insight  Moderate persistent asthma with acute exacerbation - Plan: fluticasone (FLOVENT HFA) 110 MCG/ACT inhaler, albuterol (PROAIR HFA) 108 (90 Base) MCG/ACT inhaler  Orders as above. I do not think she is in fluid overload. Wt near baseline, she is on Lasix. Question dose given how long it takes for her to urinate, though she does not have JVD or gallops. Not having a productive cough suggestive of overload. Will tx for a yellow zone asthma exacerbation, add ICS with maintenance. Rinse mouth out afterwards.  F/u prn. Pt and daughter voiced understanding and agreement to the plan.  [place], DO 08/19/17  3:26  PM

## 2017-08-18 NOTE — Patient Instructions (Signed)
Asthma Action Plan There are 3 zones to consider: 1. Green Zone- This is the zone we hope to keep you in. Avoiding allergens and compliance with inhalers will keep you in this safe zone. No need to take anything extra while in this zone. 2. Yellow Zone- This zone is where we can save time, money and visits. You are not doing well enough to be considered in the green zone, but not bad enough to be in the red zone. This is where we need to remove any allergen or factor that is contributing to your breathing issues. You have a separate inhaler (inhaled steroid) to take twice daily in addition to your other inhalers. This should give you the push you need to get back to the green zone. Contact our office if you have any questions. 3. Red Zone- This is the zone where you should consider calling 911 vs going to the ER. Despite compliance with inhalers/meds (or maybe because we haven't been using them), our breathing isn't where we want it to be. Albuterol isn't helping as much either and you need medical care. This is the zone we try to avoid as much as possible!  Follow up with lung team in 1 week if no better. Cancel appointment if you are doing better.

## 2017-08-18 NOTE — Progress Notes (Signed)
Pre visit review using our clinic review tool, if applicable. No additional management support is needed unless otherwise documented below in the visit note. 

## 2017-08-19 ENCOUNTER — Ambulatory Visit: Payer: Medicare Other | Admitting: Adult Health

## 2017-08-19 ENCOUNTER — Encounter: Payer: Self-pay | Admitting: Family Medicine

## 2017-08-28 ENCOUNTER — Other Ambulatory Visit: Payer: Self-pay | Admitting: Cardiology

## 2017-08-30 ENCOUNTER — Telehealth: Payer: Self-pay

## 2017-08-30 NOTE — Telephone Encounter (Signed)
Spoke w/ Beau Fanny- informed that we are requesting 1 canister or 1 inhaler or 10.2g. Information will be forwarded to PA dept.

## 2017-08-30 NOTE — Telephone Encounter (Signed)
[employer] called to get clarification about how many canisters the pt is needing for this Rx; contact: [phone] option 5 Fax: [phone]

## 2017-08-30 NOTE — Telephone Encounter (Signed)
PA initiated via Covermymeds; KEY: [id number]. Awaiting determination.

## 2017-08-31 NOTE — Telephone Encounter (Signed)
Received fax from [employer], PA not required for medication when quantity is within the allowed limit of 1 canister/inhaler per 30 days.

## 2017-08-31 NOTE — Telephone Encounter (Signed)
PA cancelled on insurance end? Awaiting information.

## 2017-09-27 ENCOUNTER — Other Ambulatory Visit: Payer: Self-pay | Admitting: Family Medicine

## 2017-10-01 ENCOUNTER — Other Ambulatory Visit: Payer: Self-pay | Admitting: Family Medicine

## 2017-10-22 ENCOUNTER — Other Ambulatory Visit: Payer: Self-pay | Admitting: Family Medicine

## 2017-10-22 DIAGNOSIS — M25552 Pain in left hip: Secondary | ICD-10-CM

## 2017-10-22 NOTE — Telephone Encounter (Signed)
Requesting:Tramadol Contract:none, needs csc LRT:[id number], needs uds Last Visit:08/18/17 Next Visit:none Last Refill:03/10/17  Please Advise

## 2017-11-09 ENCOUNTER — Encounter: Payer: Self-pay | Admitting: Adult Health

## 2017-11-09 ENCOUNTER — Ambulatory Visit (INDEPENDENT_AMBULATORY_CARE_PROVIDER_SITE_OTHER): Payer: Medicare Other | Admitting: Adult Health

## 2017-11-09 ENCOUNTER — Ambulatory Visit (INDEPENDENT_AMBULATORY_CARE_PROVIDER_SITE_OTHER)
Admission: RE | Admit: 2017-11-09 | Discharge: 2017-11-09 | Disposition: A | Discharge status: Home / Self Care | Payer: Medicare Other | Source: Ambulatory Visit | Attending: Adult Health | Admitting: Adult Health

## 2017-11-09 VITALS — BP 118/64 | HR 75 | Ht 63.0 in | Wt 174.6 lb

## 2017-11-09 DIAGNOSIS — J45909 Unspecified asthma, uncomplicated: Secondary | ICD-10-CM | POA: Diagnosis not present

## 2017-11-09 DIAGNOSIS — I5033 Acute on chronic diastolic (congestive) heart failure: Secondary | ICD-10-CM

## 2017-11-09 MED ORDER — LEVALBUTEROL HCL 0.63 MG/3ML IN NEBU
0.6300 mg | INHALATION_SOLUTION | Freq: Once | RESPIRATORY_TRACT | Status: AC
  Administered 2017-11-09: 0.63 mg via RESPIRATORY_TRACT

## 2017-11-09 MED ORDER — PREDNISONE 10 MG PO TABS: ORAL_TABLET | ORAL | 0 refills | Status: DC

## 2017-11-09 NOTE — Progress Notes (Signed)
@Patient  ID: Crystal Kerr, female    DOB: 1924-08-07, 84 y.o.   MRN: [id number]  Chief Complaint  Patient presents with  . Acute Visit    Asthma     Referring provider: [name], MD  HPI: 84 year old female seen in May 2018 after a pneumonia complicated by rhinovirus.  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 There is suggestion of cavitation with a larger nodules although this is difficult to distinguish from the bronchiectatic changes. CT chest September 22, 2016 showed market improvement in the multifocal bilateralcentrallyairspace disease on previous study. 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  Elevated sed rate at 99.  Amiodarone was discontinued   11/09/2017 Acute OV : [name]  Patient presents for an acute office visit.  Complains over the last week that she has had increased dry cough wheezing and shortness of breath.  Over the last few days this is gotten worse.  She is having a hard time breathing with intermittent wheezing and dry cough.  She denies any fever, discolored mucus, chest pain.  Patient has noticed increased leg swelling.  Patient has Lasix to take as needed but has not been taking this. CXR today shows chronic changes without acute process.    O2 sat walking to exam room today 95%, O2 saturation on room air at rest 100%.  No Known Allergies  Immunization History  Administered Date(s) Administered  . Influenza Split 10/11/2011, 11/28/2012, 10/02/2016   . Influenza,inj,Quad PF,6+ Mos 11/07/2014, 09/12/2015  . Influenza-Unspecified 11/19/2013  . Pneumococcal Conjugate-13 12/25/2015  . Pneumococcal Polysaccharide-23 01/13/2008    Past Medical History:  Diagnosis Date  . Arthritis   . Asthma   . Atrial fibrillation ([place])    a. s/p DCCV in 2015, on Amiodarone and Eliquis  . CHF (congestive heart failure) ([place])   . COPD (chronic obstructive pulmonary disease) ([place])   . PVC's (premature ventricular contractions)     Tobacco History: Social History   Tobacco Use  Smoking Status Former Smoker  . Packs/day: 1.00  . Years: 20.00  . Pack years: 20.00  . Types: Cigarettes  . Last attempt to quit: 04/13/1981  . Years since quitting: 36.6  Smokeless Tobacco Never Used   Counseling given: Not Answered   Outpatient Medications Prior to Visit  Medication Sig Dispense Refill  . acetaminophen (TYLENOL) 325 MG tablet Take 2 tablets (650 mg total) by mouth every 6 (six) hours as needed for mild pain or headache. 30 tablet 0  . albuterol (PROAIR HFA) 108 (90 Base) MCG/ACT inhaler Inhale 2 puffs into the lungs every 4 (four) hours as needed for wheezing or shortness of breath. 18 g 5  . apixaban (ELIQUIS) 5 MG TABS tablet Take 1 tablet (5 mg total) by mouth 2 (two) times daily. 60 tablet 5  . diltiazem (TIAZAC) 120 MG 24 hr capsule TAKE 1 CAPSULE BY MOUTH DAILY 90 capsule 3  . ELIQUIS 5 MG TABS tablet TAKE 1 TABLET(5 MG) BY MOUTH TWICE DAILY 180 tablet 1  . famotidine (  PEPCID) 20 MG tablet TAKE 1 TABLET(20 MG) BY MOUTH AT BEDTIME 90 tablet 1  . furosemide (LASIX) 20 MG tablet Take 1 tablet (20 mg total) by mouth daily. Use as needed for swelling 90 tablet 3  . mirabegron ER (MYRBETRIQ) 25 MG TB24 tablet Take 1 tablet (25 mg total) by mouth daily. Use for bladder symptoms 30 tablet 6  . montelukast (SINGULAIR) 10 MG tablet TAKE 1 TABLET BY MOUTH DAILY 90 tablet 0  . Multiple Vitamins-Minerals (MULTIVITAMIN WITH MINERALS) tablet Take 1 tablet by  mouth daily.    Marland Kitchen OVER THE COUNTER MEDICATION [employer] probiotic once daily    . pantoprazole (PROTONIX) 40 MG tablet Take 1 tablet (40 mg total) by mouth daily before breakfast. 90 tablet 3  . SYMBICORT 160-4.5 MCG/ACT inhaler INHALE 2 PUFFS FIRST THING IN THE MORNING AND THEN 2 MORE PUFFS IN 12 HOURS 10.2 g 2  . traMADol (ULTRAM) 50 MG tablet TAKE 1/2 TO 1 TABLET BY MOUTH EVERY 8 HOURS AS NEEDED FOR HIP PAIN 60 tablet 0  . triamcinolone cream (KENALOG) 0.1 % Apply 1 application topically 2 (two) times daily. Apply to rash on side as needed 30 g 1  . acidophilus (RISAQUAD) CAPS capsule Take 1 capsule by mouth every other day.    . fluticasone (FLONASE) 50 MCG/ACT nasal spray Place 1 spray into both nostrils daily. (Patient not taking: Reported on 11/09/2017) 16 g 2  . fluticasone (FLOVENT HFA) 110 MCG/ACT inhaler Take 2 puffs twice daily when you enter your Yellow Zone. (Patient not taking: Reported on 11/09/2017) 1 Inhaler 0   No facility-administered medications prior to visit.      Review of Systems  Constitutional:   No  weight loss, night sweats,  Fevers, chills, + fatigue, or  lassitude.  HEENT:   No headaches,  Difficulty swallowing,  Tooth/dental problems, or  Sore throat,                No sneezing, itching, ear ache, nasal congestion, post nasal drip,   CV:  No chest pain,  Orthopnea, PND, swelling in lower extremities, anasarca, dizziness, palpitations, syncope.   GI  No heartburn, indigestion, abdominal pain, nausea, vomiting, diarrhea, change in bowel habits, loss of appetite, bloody stools.   Resp:  .  No chest wall deformity  Skin: no rash or lesions.  GU: no dysuria, change in color of urine, no urgency or frequency.  No flank pain, no hematuria   MS:  No joint pain or swelling.  No decreased range of motion.  No back pain.    Physical Exam  BP 118/64 (BP Location: Left Arm, Cuff Size: Normal)   Pulse 75   Ht 5\' 3"  (1.6 m)   Wt 174 lb 9.6 oz (79.2 kg)    SpO2 98%   BMI 30.93 kg/m   GEN: A/Ox3; pleasant , NAD, elderly    HEENT:  [place]/AT,  EACs-clear, TMs-wnl, NOSE-clear, THROAT-clear, no lesions, no postnasal drip or exudate noted.   NECK:  Supple w/ fair ROM; no JVD; normal carotid impulses w/o bruits; no thyromegaly or nodules palpated; no lymphadenopathy.    RESP  Few trace exp wheezing on forced expiration   no accessory muscle use, no dullness to percussion  CARD:  RRR, no m/r/g, 1-2 + peripheral edema, pulses intact, no cyanosis or clubbing.  GI:   Soft & nt; nml bowel sounds; no organomegaly or masses detected.   Musco: Warm bil, no deformities or joint swelling noted.  Neuro: alert, no focal deficits noted.    Skin: Warm, no lesions or rashes    Lab Results:  CBC  BMET   BNP Imaging: Dg Chest 2 View  Result Date: 11/09/2017 CLINICAL DATA:  Intrinsic asthma.  Interstitial pulmonary fibrosis. EXAM: CHEST - 2 VIEW COMPARISON:  Two-view chest x-ray 03/11/2017 FINDINGS: The heart is mildly enlarged is present. Aortic atherosclerosis is present. Interstitial lung disease is again noted. No focal airspace disease is superimposed. There is no superimposed edema or effusion. Degenerative changes are noted at the shoulders. The visualized soft tissues and bony thorax are otherwise unremarkable. Surgical clips are present at the gallbladder fossa. IMPRESSION: 1. Stable appearance of chronic interstitial lung disease. 2. No superimposed disease. 3. Aortic atherosclerosis. Electronically Signed   By: San Morelle M.D.   On: 11/09/2017 14:41    levalbuterol (XOPENEX) nebulizer solution 0.63 mg    Date Action Dose Route User   11/09/2017 1505 Given 0.63 mg Nebulization Rinaldo Ratel, CMA      PFT Results Latest Ref Rng & Units 03/19/2014  FVC-Pre L 1.58  FVC-Predicted Pre % 77  FVC-Post L 1.70  FVC-Predicted Post % 83  Pre FEV1/FVC % % 74  Post FEV1/FCV % % 75  FEV1-Pre L 1.17  FEV1-Predicted Pre % 78  DLCO UNC%  % 68  DLCO COR %Predicted % 103  TLC L 3.43  TLC % Predicted % 72  RV % Predicted % 17    No results found for: NITRICOXIDE      Assessment & Plan:   Intrinsic asthma Mild flare -  xopenex x 1  Chest xray w/out acute process  Brief steroid burst   Plan Patient Instructions  Prednisone 20mg  daily for 4 days, take with food  Mucinex DM Twice daily  As needed  Cough/congestion  Decrease Salt intake .  Take Lasix 20mg  daily for 3 days then As needed  For swelling .  Follow up with  Dr. [name]  Or [name] 2 weeks   Please contact office for sooner follow up if symptoms do not improve or worsen or seek emergency care       Acute on chronic diastolic congestive heart failure Mild decompensation  Would use lasix for few days to help with extra fluid  Gentle diuresis   Plan  Patient Instructions  Prednisone 20mg  daily for 4 days, take with food  Mucinex DM Twice daily  As needed  Cough/congestion  Decrease Salt intake .  Take Lasix 20mg  daily for 3 days then As needed  For swelling .  Follow up with  Dr. [name]  Or [name] 2 weeks   Please contact office for sooner follow up if symptoms do not improve or worsen or seek emergency care          [name], NP 11/09/2017

## 2017-11-09 NOTE — Assessment & Plan Note (Signed)
Mild flare -  xopenex x 1  Chest xray w/out acute process  Brief steroid burst   Plan Patient Instructions  Prednisone 20mg  daily for 4 days, take with food  Mucinex DM Twice daily  As needed  Cough/congestion  Decrease Salt intake .  Take Lasix 20mg  daily for 3 days then As needed  For swelling .  Follow up with  Dr. [name]  Or Maryclare Nydam NP 2 weeks   Please contact office for sooner follow up if symptoms do not improve or worsen or seek emergency care

## 2017-11-09 NOTE — Assessment & Plan Note (Signed)
Mild decompensation  Would use lasix for few days to help with extra fluid  Gentle diuresis   Plan  Patient Instructions  Prednisone 20mg  daily for 4 days, take with food  Mucinex DM Twice daily  As needed  Cough/congestion  Decrease Salt intake .  Take Lasix 20mg  daily for 3 days then As needed  For swelling .  Follow up with  Dr. [name]  Or Leah Skora NP 2 weeks   Please contact office for sooner follow up if symptoms do not improve or worsen or seek emergency care

## 2017-11-09 NOTE — Patient Instructions (Signed)
Prednisone 20mg  daily for 4 days, take with food  Mucinex DM Twice daily  As needed  Cough/congestion  Decrease Salt intake .  Take Lasix 20mg  daily for 3 days then As needed  For swelling .  Follow up with  Dr. [name]  Or Merl Guardino NP 2 weeks   Please contact office for sooner follow up if symptoms do not improve or worsen or seek emergency care

## 2017-11-23 ENCOUNTER — Encounter: Payer: Self-pay | Admitting: Adult Health

## 2017-11-23 ENCOUNTER — Other Ambulatory Visit (INDEPENDENT_AMBULATORY_CARE_PROVIDER_SITE_OTHER): Payer: Medicare Other

## 2017-11-23 ENCOUNTER — Ambulatory Visit (INDEPENDENT_AMBULATORY_CARE_PROVIDER_SITE_OTHER): Payer: Medicare Other | Admitting: Adult Health

## 2017-11-23 VITALS — BP 110/64 | HR 67 | Ht 63.0 in | Wt 173.0 lb

## 2017-11-23 DIAGNOSIS — I5032 Chronic diastolic (congestive) heart failure: Secondary | ICD-10-CM | POA: Diagnosis not present

## 2017-11-23 DIAGNOSIS — J45909 Unspecified asthma, uncomplicated: Secondary | ICD-10-CM

## 2017-11-23 DIAGNOSIS — R0600 Dyspnea, unspecified: Secondary | ICD-10-CM

## 2017-11-23 DIAGNOSIS — Z23 Encounter for immunization: Secondary | ICD-10-CM

## 2017-11-23 DIAGNOSIS — R0602 Shortness of breath: Secondary | ICD-10-CM | POA: Diagnosis not present

## 2017-11-23 DIAGNOSIS — J849 Interstitial pulmonary disease, unspecified: Secondary | ICD-10-CM | POA: Diagnosis not present

## 2017-11-23 LAB — CBC WITH DIFFERENTIAL/PLATELET
BASOS PCT: 0.6 % (ref 0.0–3.0)
Basophils Absolute: 0.1 10*3/uL (ref 0.0–0.1)
EOS PCT: 0.6 % (ref 0.0–5.0)
Eosinophils Absolute: 0.1 10*3/uL (ref 0.0–0.7)
HCT: 31.9 % — ABNORMAL LOW (ref 36.0–46.0)
Hemoglobin: 10.1 g/dL — ABNORMAL LOW (ref 12.0–15.0)
LYMPHS ABS: 2.3 10*3/uL (ref 0.7–4.0)
LYMPHS PCT: 21.6 % (ref 12.0–46.0)
MCHC: 31.6 g/dL (ref 30.0–36.0)
MCV: 72.8 fl — ABNORMAL LOW (ref 78.0–100.0)
MONO ABS: 1.1 10*3/uL — AB (ref 0.1–1.0)
MONOS PCT: 10.3 % (ref 3.0–12.0)
NEUTROS PCT: 66.9 % (ref 43.0–77.0)
Neutro Abs: 7.2 10*3/uL (ref 1.4–7.7)
Platelets: 305 10*3/uL (ref 150.0–400.0)
RBC: 4.38 Mil/uL (ref 3.87–5.11)
RDW: 19.1 % — AB (ref 11.5–15.5)
WBC: 10.8 10*3/uL — AB (ref 4.0–10.5)

## 2017-11-23 LAB — BASIC METABOLIC PANEL
BUN: 34 mg/dL — ABNORMAL HIGH (ref 6–23)
CHLORIDE: 104 meq/L (ref 96–112)
CO2: 28 mEq/L (ref 19–32)
Calcium: 9.5 mg/dL (ref 8.4–10.5)
Creatinine, Ser: 1.22 mg/dL — ABNORMAL HIGH (ref 0.40–1.20)
GFR: 43.91 mL/min — AB (ref >60.00)
Glucose, Bld: 105 mg/dL — ABNORMAL HIGH (ref 70–99)
POTASSIUM: 3.9 meq/L (ref 3.5–5.1)
SODIUM: 143 meq/L (ref 135–145)

## 2017-11-23 LAB — SEDIMENTATION RATE: SED RATE: 83 mm/h — AB (ref 0–30)

## 2017-11-23 NOTE — Progress Notes (Signed)
@Patient  ID: Crystal Kerr, female    DOB: 09-22-1924, 84 y.o.   MRN: [id number]  Chief Complaint  Patient presents with  . Follow-up    Asthma     Referring provider: [name], MD  HPI: 84 year old female former seen in May 2018 after a pneumonia complicated by rhinovirus.  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    TEST/EVENTS :  Echo 2015 EF 65%, Gr 2 DD , Mod Dilated RA  PFTs 03/20/14 FEV1 1.28 (85%) ratio 75 s resp to saba and dlco 68 corrects to 103  CT chest Jun 02, 2016 showed groundglass opacities and clustered nodules compatible with an acute pneumonia. There is suggestion of cavitation with a larger nodules although this is difficult to distinguish from the bronchiectatic changes.  CT chest September 22, 2016 showed market improvement in the multifocal bilateralcentrallyairspace disease on previous study.  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  Elevated sed rate at 99.  Amiodarone was discontinued High-resolution CT chest February 2019> extensive patchy groundglass opacities throughout both lungs involving all lung lobes largely new since September 2018 with mild mediastinal lymphadenopathy  HRCT August 17, 2017> shows some residual groundglass attenuation and scattered areas of mild bronchiectasis.  No honeycombing.  Several regions of groundglass attenuation on prior study have resolved.     11/23/2017 Follow up: Asthma , ILD , D CHF  Patient presents for a 2-week follow-up.  She was seen last visit with a mild flare of asthma and and suspected mildly decompensated diastolic heart failure.  She was given prednisone 20 mg daily for 4 days and recommend to use Lasix on a daily basis. Since last  visit she is feeling better.  Breathing has improved and leg swelling has decreased.  Weight is down 1 pound.  Patient says she continues to remain weak.  And gets winded with heavy activity.  She denies any increased cough or wheezing. Appetite is good. She remains on Symbicort twice daily       No Known Allergies  Immunization History  Administered Date(s) Administered  . Influenza Split 10/11/2011, 11/28/2012, 10/02/2016  . Influenza,inj,Quad PF,6+ Mos 11/07/2014, 09/12/2015  . Influenza-Unspecified 11/19/2013  . Pneumococcal Conjugate-13 12/25/2015  . Pneumococcal Polysaccharide-23 01/13/2008    Past Medical History:  Diagnosis Date  . Arthritis   . Asthma   . Atrial fibrillation ([place])    a. s/p DCCV in 2015, on Amiodarone and Eliquis  . CHF (congestive heart failure) ([place])   . COPD (chronic obstructive pulmonary disease) ([place])   . PVC's (premature ventricular contractions)     Tobacco History: Social History   Tobacco Use  Smoking Status Former Smoker  . Packs/day: 1.00  . Years: 20.00  . Pack years: 20.00  . Types: Cigarettes  . Last attempt to quit: 04/13/1981  . Years since quitting: 36.6  Smokeless Tobacco Never Used   Counseling given: Not Answered   Outpatient Medications Prior to Visit  Medication Sig Dispense Refill  . acetaminophen (TYLENOL) 325 MG tablet Take 2 tablets (650 mg total) by mouth every 6 (six) hours as needed for mild pain or headache. 30 tablet 0  . albuterol (PROAIR HFA) 108 (90 Base) MCG/ACT inhaler Inhale 2 puffs into the lungs every 4 (four) hours as needed for wheezing or shortness of  breath. 18 g 5  . diltiazem (TIAZAC) 120 MG 24 hr capsule TAKE 1 CAPSULE BY MOUTH DAILY 90 capsule 3  . ELIQUIS 5 MG TABS tablet TAKE 1 TABLET(5 MG) BY MOUTH TWICE DAILY 180 tablet 1  . famotidine (PEPCID) 20 MG tablet TAKE 1 TABLET(20 MG) BY MOUTH AT BEDTIME 90 tablet 1  . fluticasone (FLONASE) 50 MCG/ACT nasal spray Place 1 spray into both nostrils  daily. 16 g 2  . furosemide (LASIX) 20 MG tablet Take 1 tablet (20 mg total) by mouth daily. Use as needed for swelling 90 tablet 3  . mirabegron ER (MYRBETRIQ) 25 MG TB24 tablet Take 1 tablet (25 mg total) by mouth daily. Use for bladder symptoms 30 tablet 6  . montelukast (SINGULAIR) 10 MG tablet TAKE 1 TABLET BY MOUTH DAILY 90 tablet 0  . Multiple Vitamins-Minerals (MULTIVITAMIN WITH MINERALS) tablet Take 1 tablet by mouth daily.    Marland Kitchen OVER THE COUNTER MEDICATION [employer] probiotic once daily    . pantoprazole (PROTONIX) 40 MG tablet Take 1 tablet (40 mg total) by mouth daily before breakfast. 90 tablet 3  . SYMBICORT 160-4.5 MCG/ACT inhaler INHALE 2 PUFFS FIRST THING IN THE MORNING AND THEN 2 MORE PUFFS IN 12 HOURS 10.2 g 2  . traMADol (ULTRAM) 50 MG tablet TAKE 1/2 TO 1 TABLET BY MOUTH EVERY 8 HOURS AS NEEDED FOR HIP PAIN 60 tablet 0  . triamcinolone cream (KENALOG) 0.1 % Apply 1 application topically 2 (two) times daily. Apply to rash on side as needed 30 g 1  . acidophilus (RISAQUAD) CAPS capsule Take 1 capsule by mouth every other day.    Marland Kitchen apixaban (ELIQUIS) 5 MG TABS tablet Take 1 tablet (5 mg total) by mouth 2 (two) times daily. (Patient not taking: Reported on 11/23/2017) 60 tablet 5  . predniSONE (DELTASONE) 10 MG tablet 2 tabs daily for 4 days (Patient not taking: Reported on 11/23/2017) 8 tablet 0   No facility-administered medications prior to visit.      Review of Systems  Constitutional:   No  weight loss, night sweats,  Fevers, chills,  +fatigue, or  lassitude.  HEENT:   No headaches,  Difficulty swallowing,  Tooth/dental problems, or  Sore throat,                No sneezing, itching, ear ache,  +nasal congestion, post nasal drip,   CV:  No chest pain,  Orthopnea, PND, swelling in lower extremities, anasarca, dizziness, palpitations, syncope.   GI  No heartburn, indigestion, abdominal pain, nausea, vomiting, diarrhea, change in bowel habits, loss of appetite, bloody  stools.   Resp:   No chest wall deformity  Skin: no rash or lesions.  GU: no dysuria, change in color of urine, no urgency or frequency.  No flank pain, no hematuria   MS:  No joint pain or swelling.  No decreased range of motion.  No back pain.    Physical Exam  BP 110/64 (BP Location: Left Arm, Cuff Size: Normal)   Pulse 67   Ht 5' 3"  (1.6 m)   Wt 173 lb (78.5 kg)   SpO2 96%   BMI 30.65 kg/m   GEN: A/Ox3; pleasant , NAD, elderly in wc    HEENT:  [place]/AT,  EACs-clear, TMs-wnl, NOSE-clear drainage  THROAT-clear, no lesions, no postnasal drip or exudate noted.   NECK:  Supple w/ fair ROM; no JVD; normal carotid impulses w/o bruits; no thyromegaly or nodules palpated; no lymphadenopathy.  RESP  Clear  P & A; w/o, wheezes/ rales/ or rhonchi. no accessory muscle use, no dullness to percussion  CARD:  RRR, no m/r/g, tr  peripheral edema, pulses intact, no cyanosis or clubbing.  GI:   Soft & nt; nml bowel sounds; no organomegaly or masses detected.   Musco: Warm bil, no deformities or joint swelling noted.   Neuro: alert, no focal deficits noted.    Skin: Warm, no lesions or rashes    Lab Results:  CBC    Component Value Date/Time   WBC 8.5 07/03/2017 1107   RBC 4.13 07/03/2017 1107   HGB 9.6 (L) 07/03/2017 1107   HGB 10.2 (L) 03/03/2017 1015   HCT 30.8 (L) 07/03/2017 1107   HCT 33.3 (L) 03/03/2017 1015   PLT 306 07/03/2017 1107   PLT 467 (H) 03/03/2017 1015   MCV 74.6 (L) 07/03/2017 1107   MCV 77 (L) 03/03/2017 1015   MCH 23.2 (L) 07/03/2017 1107   MCHC 31.2 07/03/2017 1107   RDW 18.2 (H) 07/03/2017 1107   RDW 16.7 (H) 03/03/2017 1015   LYMPHSABS 1.7 03/03/2017 1015   MONOABS 0.7 06/05/2016 0537   EOSABS 0.3 03/03/2017 1015   BASOSABS 0.0 03/03/2017 1015    BMET    Component Value Date/Time   NA 140 07/03/2017 1107   NA 141 03/03/2017 1015   K 3.9 07/03/2017 1107   CL 105 07/03/2017 1107   CO2 26 07/03/2017 1107   GLUCOSE 131 (H) 07/03/2017 1107     BUN 32 (H) 07/03/2017 1107   BUN 19 03/03/2017 1015   CREATININE 1.43 (H) 07/03/2017 1107   CREATININE 1.25 (H) 01/02/2014 1722   CALCIUM 8.9 07/03/2017 1107   CALCIUM 9.3 04/22/2011 0000   GFRNONAA 31 (L) 07/03/2017 1107   GFRNONAA 49 (L) 04/22/2011 0000   GFRAA 36 (L) 07/03/2017 1107   GFRAA 56 (L) 04/22/2011 0000    BNP    Component Value Date/Time   BNP 120.1 (H) 08/11/2016 1439   BNP 176.2 (H) 06/02/2016 1659   BNP 86.4 07/15/2013 1442    ProBNP    Component Value Date/Time   PROBNP 104.0 (H) 02/15/2013 1323    Imaging: Dg Chest 2 View  Result Date: 11/09/2017 CLINICAL DATA:  Intrinsic asthma.  Interstitial pulmonary fibrosis. EXAM: CHEST - 2 VIEW COMPARISON:  Two-view chest x-ray 03/11/2017 FINDINGS: The heart is mildly enlarged is present. Aortic atherosclerosis is present. Interstitial lung disease is again noted. No focal airspace disease is superimposed. There is no superimposed edema or effusion. Degenerative changes are noted at the shoulders. The visualized soft tissues and bony thorax are otherwise unremarkable. Surgical clips are present at the gallbladder fossa. IMPRESSION: 1. Stable appearance of chronic interstitial lung disease. 2. No superimposed disease. 3. Aortic atherosclerosis. Electronically Signed   By: San Morelle M.D.   On: 11/09/2017 14:41    levalbuterol (XOPENEX) nebulizer solution 0.63 mg    Date Action Dose Route User   11/09/2017 1505 Given 0.63 mg Nebulization Rinaldo Ratel, CMA      PFT Results Latest Ref Rng & Units 03/19/2014  FVC-Pre L 1.58  FVC-Predicted Pre % 77  FVC-Post L 1.70  FVC-Predicted Post % 83  Pre FEV1/FVC % % 74  Post FEV1/FCV % % 75  FEV1-Pre L 1.17  FEV1-Predicted Pre % 78  FEV1-Post L 1.28  DLCO UNC% % 68  DLCO COR %Predicted % 103  TLC L 3.43  TLC % Predicted % 72  RV %  Predicted % 17    No results found for: NITRICOXIDE      Assessment & Plan:   Intrinsic asthma Recent mild flare  now improved  Cont on Symbicort .    Diastolic CHF (HCC) Recent mild decompensation of diastolic congestive heart failure-improved on Lasix.  Labs today with be met to check kidney function  Shortness of breath Dyspnea is improved with diuresis, asthma control and recent steroids.  Suspect this is multifactorial with underlying diastolic heart failure ,interstitial lung changes.  Overall she appears improved and stable.  We discussed ongoing testing for need for oxygen.  At rest her O2 saturations are adequate.  She prefers not to check overnight oximetry testing at this time.  Or ambulatory oxygen testing. Check cbc.   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  Most recent CT chest does show significant improvement since amiodarone was stopped.  She does have some residual chronic changes but appears stable and is currently not oxygen dependent.  Labs today with sed rate and HP panel .   Plan  Patient Instructions  Flu shot today .  Mucinex DM Twice daily  As needed  Cough/congestion  Decrease Salt intake .  Continue on Symbicort 2 puffs Twice daily   Labs today . Continue on lasix.  Follow up with  Dr. [name]  Or Khori Underberg NP 2 months and As needed   Please contact office for sooner follow up if symptoms do not improve or worsen or seek emergency care        [name], NP 11/23/2017

## 2017-11-23 NOTE — Patient Instructions (Signed)
Flu shot today .  Mucinex DM Twice daily  As needed  Cough/congestion  Decrease Salt intake .  Continue on Symbicort 2 puffs Twice daily   Labs today . Continue on lasix.  Follow up with  Dr. [name]  Or Ramie Palladino NP 2 months and As needed   Please contact office for sooner follow up if symptoms do not improve or worsen or seek emergency care

## 2017-11-23 NOTE — Assessment & Plan Note (Addendum)
Dyspnea is improved with diuresis, asthma control and recent steroids.  Suspect this is multifactorial with underlying diastolic heart failure ,interstitial lung changes.  Overall she appears improved and stable.  We discussed ongoing testing for need for oxygen.  At rest her O2 saturations are adequate.  She prefers not to check overnight oximetry testing at this time.  Or ambulatory oxygen testing. Check cbc.

## 2017-11-23 NOTE — Assessment & Plan Note (Signed)
Recent mild flare now improved  Cont on Symbicort .

## 2017-11-23 NOTE — Assessment & Plan Note (Signed)
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  Most recent CT chest does show significant improvement since amiodarone was stopped.  She does have some residual chronic changes but appears stable and is currently not oxygen dependent.  Labs today with sed rate and HP panel .   Plan  Patient Instructions  Flu shot today .  Mucinex DM Twice daily  As needed  Cough/congestion  Decrease Salt intake .  Continue on Symbicort 2 puffs Twice daily   Labs today . Continue on lasix.  Follow up with  Dr. [name]  Or Deidrea Gaetz NP 2 months and As needed   Please contact office for sooner follow up if symptoms do not improve or worsen or seek emergency care

## 2017-11-23 NOTE — Assessment & Plan Note (Signed)
Recent mild decompensation of diastolic congestive heart failure-improved on Lasix.  Labs today with be met to check kidney function

## 2017-11-24 NOTE — Progress Notes (Signed)
Reviewed and agree with assessment/plan.   Kaleiyah Polsky, MD [facility] 01/08/2016, 12:24 PM Pager:  [phone]  

## 2017-11-26 LAB — HYPERSENSITIVITY PNEUMONITIS
A. PULLULANS ABS: NEGATIVE
A.Fumigatus #1 Abs: NEGATIVE
Micropolyspora faeni, IgG: NEGATIVE
PIGEON SERUM ABS: NEGATIVE
THERMOACT. SACCHARII: NEGATIVE
Thermoactinomyces vulgaris, IgG: NEGATIVE

## 2017-11-29 NOTE — Progress Notes (Signed)
Called spoke with patient's daughter [name].  Advised of lab results / recs as stated by TP.  Spouse verbalized understanding and denied any questions.

## 2017-12-13 ENCOUNTER — Ambulatory Visit: Payer: Self-pay | Admitting: *Deleted

## 2017-12-13 DIAGNOSIS — D494 Neoplasm of unspecified behavior of bladder: Secondary | ICD-10-CM

## 2017-12-13 DIAGNOSIS — R319 Hematuria, unspecified: Secondary | ICD-10-CM

## 2017-12-13 NOTE — Telephone Encounter (Signed)
Patient daughter called to say that patient reported to her that she had a rust colored BM with flakes of blood in it. Also that she had Bladder cancer surgery in May of 2018. Ph# [phone]  Patient states she is having some rust colored urine and blood flakes in urine. Patient reports occasional dark urine and occasional blood. Patient does not see anyone in town for this since her cancer treatment. She states she would like to have a referral to a urologist in [place]- with her history- she states she knows this is where this a headed and she does not want to waste Dr Copland's time. Told patient I would send her request- if she is required to be seen- we would call her and schedule her an appointment.She request a call if we schedule anything- she has to schedule transportation.  Reason for Disposition . Blood in urine  (Exception: could be normal menstrual bleeding)    Patient declines appointment- she has history of bladder cancer and she thinks she needs referral to urology.  Answer Assessment - Initial Assessment Questions 1. COLOR of URINE: "Describe the color of the urine."  (e.g., tea-colored, pink, red, blood clots, bloody)     Infrequent blood/discoloration in urine 2. ONSET: "When did the bleeding start?"      Saturday was the first day she noticed this occurring 3. EPISODES: "How many times has there been blood in the urine?" or "How many times today?"     Since Saturday- 3 times, none today 4. PAIN with URINATION: "Is there any pain with passing your urine?" If so, ask: "How bad is the pain?"  (Scale 1-10; or mild, moderate, severe)    - MILD - complains slightly about urination hurting    - MODERATE - interferes with normal activities      - SEVERE - excruciating, unwilling or unable to urinate because of the pain      No pain 5. FEVER: "Do you have a fever?" If so, ask: "What is your temperature, how was it measured, and when did it start?"     No fever 6. ASSOCIATED  SYMPTOMS: "Are you passing urine more frequently than usual?"     no 7. OTHER SYMPTOMS: "Do you have any other symptoms?" (e.g., back/flank pain, abdominal pain, vomiting)     Nothing new 8. PREGNANCY: "Is there any chance you are pregnant?" "When was your last menstrual period?"     n/a  Protocols used: URINE - BLOOD IN-A-AH

## 2017-12-13 NOTE — Addendum Note (Signed)
Addended by: Lamar Blinks C on: 12/13/2017 05:33 PM   Modules accepted: Orders

## 2017-12-13 NOTE — Telephone Encounter (Signed)
Called her back- she would like a referral to [facility]  She has noted some dark urine with "flecks" of blood in it for the last week.  Her urine might look/smell strong but otherwise she is not having any physical symptoms She will come in tomorrow to bring [place] a urine sample for analysis and culture   She was treated for bladder cancer per [facility] urology in May of 2018 However at this time she would like to establish urology care in [place] so I will order this referral

## 2017-12-14 ENCOUNTER — Other Ambulatory Visit (INDEPENDENT_AMBULATORY_CARE_PROVIDER_SITE_OTHER): Payer: Medicare Other

## 2017-12-14 DIAGNOSIS — R319 Hematuria, unspecified: Secondary | ICD-10-CM | POA: Diagnosis not present

## 2017-12-14 DIAGNOSIS — D494 Neoplasm of unspecified behavior of bladder: Secondary | ICD-10-CM | POA: Diagnosis not present

## 2017-12-14 LAB — POCT URINALYSIS DIP (MANUAL ENTRY)
BILIRUBIN UA: NEGATIVE
BILIRUBIN UA: NEGATIVE mg/dL
Glucose, UA: NEGATIVE mg/dL
LEUKOCYTES UA: NEGATIVE
Nitrite, UA: NEGATIVE
PH UA: 6 (ref 5.0–8.0)
SPEC GRAV UA: 1.015 (ref 1.010–1.025)
Urobilinogen, UA: 0.2 E.U./dL

## 2017-12-15 LAB — URINE CULTURE
MICRO NUMBER: 91445514
SPECIMEN QUALITY:: ADEQUATE

## 2017-12-16 ENCOUNTER — Telehealth: Payer: Self-pay | Admitting: Cardiology

## 2017-12-16 ENCOUNTER — Encounter (HOSPITAL_COMMUNITY): Payer: Self-pay | Admitting: *Deleted

## 2017-12-16 ENCOUNTER — Encounter: Payer: Self-pay | Admitting: Family Medicine

## 2017-12-16 DIAGNOSIS — C678 Malignant neoplasm of overlapping sites of bladder: Secondary | ICD-10-CM | POA: Diagnosis not present

## 2017-12-16 DIAGNOSIS — R310 Gross hematuria: Secondary | ICD-10-CM | POA: Diagnosis not present

## 2017-12-16 NOTE — Telephone Encounter (Signed)
New message      [facility] Pre-operative Risk Assessment    Request for surgical clearance:  1. What type of surgery is being performed? TUR Bladder Tumor  2. When is this surgery scheduled? 12/24/2017   3. What type of clearance is required (medical clearance vs. Pharmacy clearance to hold med vs. Both)? pharmacy  4. Are there any medications that need to be held prior to surgery and how long?Eliquis, Leave up to Cardilogist how long to hold medication  Practice name and name of physician performing surgery? [facility], Dr. Link Snuffer  5. What is your office phone number [phone] ext [phone]   7.   What is your office fax number [phone]  8.   Anesthesia type (None, local, MAC, general) ? general   Crystal Kerr 12/16/2017, 1:49 PM  _________________________________________________________________   (provider comments below)

## 2017-12-17 NOTE — Telephone Encounter (Signed)
   Primary Cardiologist: Kirk Ruths, MD  Chart reviewed as part of pre-operative protocol coverage. Patient was contacted 12/17/2017 in reference to pre-operative risk assessment for pending surgery as outlined below.    I called and spoke with Crystal Kerr's daughter, [name], who is POA for Crystal Kerr. She reports that Crystal Kerr has bloody urine and was advised to stop anticoagulation by urology. The patient decided to stop the Eliquis with last dose last evening. Per the daughter, the patient has not had any palpitations or feelings of being in afib since the cardioversion in 2015. No current palpitations although the patient has been more short of breath and fatigued.   Her procedure, which she apparently needs to have done is scheduled for 12/24/17. She had a regular follow up appt scheduled for 12/28/17. I have moved her appt to 12/22/17 with Kerin Ransom for assessment prior to her procedure per daughter's request.   [name] and I discussed that Crystal Kerr is at slightly increased risk of stroke while not on anticoagulation if the Crystal Kerr were to go into afib. With her ongoing hematuria, it may be better for her to stay off Eliquis until after her procedure.   I will send this note to Kerin Ransom for upcoming appt.  Daune Perch, AGNP-C [facility] 12/17/2017  2:51 PM

## 2017-12-17 NOTE — Telephone Encounter (Signed)
Patient with diagnosis of Afib on Eliquis for anticoagulation.    Procedure: TURP Date of procedure: 12/24/17  CHADS2-VASc score of  4 (CHF, HTN, AGE, DM2, stroke/tia x 2, CAD, AGE, female)  CrCl 36ml/min  Per office protocol, patient can hold Eliquis for 1-2 days prior to procedure.

## 2017-12-20 DIAGNOSIS — K5730 Diverticulosis of large intestine without perforation or abscess without bleeding: Secondary | ICD-10-CM | POA: Diagnosis not present

## 2017-12-20 DIAGNOSIS — C678 Malignant neoplasm of overlapping sites of bladder: Secondary | ICD-10-CM | POA: Diagnosis not present

## 2017-12-20 DIAGNOSIS — N281 Cyst of kidney, acquired: Secondary | ICD-10-CM | POA: Diagnosis not present

## 2017-12-20 NOTE — Patient Instructions (Addendum)
Crystal Kerr  12/20/2017   Your procedure is scheduled on: 12/24/2017   Report to [facility] Main  Entrance  Report to admitting at   1240pm    Call this number if you have problems the morning of surgery [phone]   Remember: Do not eat food  :After Midnight. BRUSH YOUR TEETH MORNING OF SURGERY AND RINSE YOUR MOUTH OUT, NO CHEWING GUM CANDY OR MINTS.  May have clear liquids from 12 midnite until 0830am morning of surgery then nothing by mouth.     CLEAR LIQUID DIET   Foods Allowed                                                                     Foods Excluded  Coffee and tea, regular and decaf                             liquids that you cannot  Plain Jell-O in any flavor                                             see through such as: Fruit ices (not with fruit pulp)                                     milk, soups, orange juice  Iced Popsicles                                    All solid food Carbonated beverages, regular and diet                                    Cranberry, grape and apple juices Sports drinks like Gatorade Lightly seasoned clear broth or consume(fat free) Sugar, honey syrup  Sample Menu Breakfast                                Lunch                                     Supper Cranberry juice                    Beef broth                            Chicken broth Jell-O                                     Grape juice  Apple juice Coffee or tea                        Jell-O                                      Popsicle                                                Coffee or tea                        Coffee or tea  _____________________________________________________________________    Take these medicines the morning of surgery with A SIP OF WATER: Inhalers as usual and bring, Diltiazem, Flonase                                 You may not have any metal on your body including hair pins and               piercings  Do not wear jewelry, make-up, lotions, powders or perfumes, deodorant             Do not wear nail polish.  Do not shave  48 hours prior to surgery.                Do not bring valuables to the hospital. [place].  Contacts, dentures or bridgework may not be worn into surgery. .     Patients discharged the day of surgery will not be allowed to drive home.  Name and phone number of your driver:                Please read over the following fact sheets you were given: _____________________________________________________________________             [facility] - Preparing for Surgery Before surgery, you can play an important role.  Because skin is not sterile, your skin needs to be as free of germs as possible.  You can reduce the number of germs on your skin by washing with CHG (chlorahexidine gluconate) soap before surgery.  CHG is an antiseptic cleaner which kills germs and bonds with the skin to continue killing germs even after washing. Please DO NOT use if you have an allergy to CHG or antibacterial soaps.  If your skin becomes reddened/irritated stop using the CHG and inform your nurse when you arrive at Short Stay. Do not shave (including legs and underarms) for at least 48 hours prior to the first CHG shower.  You may shave your face/neck. Please follow these instructions carefully:  1.  Shower with CHG Soap the night before surgery and the  morning of Surgery.  2.  If you choose to wash your hair, wash your hair first as usual with your  normal  shampoo.  3.  After you shampoo, rinse your hair and body thoroughly to remove the  shampoo.  4.  Use CHG as you would any other liquid soap.  You can apply chg directly  to the skin and wash                       Gently with a scrungie or clean washcloth.  5.  Apply the CHG Soap to your body ONLY FROM THE NECK DOWN.   Do not use on face/  open                           Wound or open sores. Avoid contact with eyes, ears mouth and genitals (private parts).                       Wash face,  Genitals (private parts) with your normal soap.             6.  Wash thoroughly, paying special attention to the area where your surgery  will be performed.  7.  Thoroughly rinse your body with warm water from the neck down.  8.  DO NOT shower/wash with your normal soap after using and rinsing off  the CHG Soap.                9.  Pat yourself dry with a clean towel.            10.  Wear clean pajamas.            11.  Place clean sheets on your bed the night of your first shower and do not  sleep with pets. Day of Surgery : Do not apply any lotions/deodorants the morning of surgery.  Please wear clean clothes to the hospital/surgery center.  FAILURE TO FOLLOW THESE INSTRUCTIONS MAY RESULT IN THE CANCELLATION OF YOUR SURGERY PATIENT SIGNATURE_________________________________  NURSE SIGNATURE__________________________________  ________________________________________________________________________

## 2017-12-20 NOTE — Progress Notes (Signed)
Need orders in epicl.  Preop on 12/10 and surgery on 12/24/2017.  Thanks.

## 2017-12-21 ENCOUNTER — Other Ambulatory Visit: Payer: Self-pay

## 2017-12-21 ENCOUNTER — Encounter (HOSPITAL_COMMUNITY): Payer: Self-pay

## 2017-12-21 ENCOUNTER — Encounter (HOSPITAL_COMMUNITY)
Admission: RE | Admit: 2017-12-21 | Discharge: 2017-12-21 | Disposition: A | Discharge status: Home / Self Care | Payer: Medicare Other | Source: Ambulatory Visit | Attending: Urology | Admitting: Urology

## 2017-12-21 DIAGNOSIS — Z01812 Encounter for preprocedural laboratory examination: Secondary | ICD-10-CM | POA: Insufficient documentation

## 2017-12-21 DIAGNOSIS — Z7901 Long term (current) use of anticoagulants: Secondary | ICD-10-CM | POA: Diagnosis not present

## 2017-12-21 DIAGNOSIS — J449 Chronic obstructive pulmonary disease, unspecified: Secondary | ICD-10-CM | POA: Diagnosis not present

## 2017-12-21 DIAGNOSIS — C679 Malignant neoplasm of bladder, unspecified: Secondary | ICD-10-CM | POA: Diagnosis present

## 2017-12-21 DIAGNOSIS — C678 Malignant neoplasm of overlapping sites of bladder: Secondary | ICD-10-CM | POA: Diagnosis not present

## 2017-12-21 DIAGNOSIS — I4891 Unspecified atrial fibrillation: Secondary | ICD-10-CM | POA: Diagnosis not present

## 2017-12-21 DIAGNOSIS — Z87891 Personal history of nicotine dependence: Secondary | ICD-10-CM | POA: Diagnosis not present

## 2017-12-21 DIAGNOSIS — R310 Gross hematuria: Secondary | ICD-10-CM | POA: Diagnosis not present

## 2017-12-21 DIAGNOSIS — Z79899 Other long term (current) drug therapy: Secondary | ICD-10-CM | POA: Diagnosis not present

## 2017-12-21 DIAGNOSIS — Z7951 Long term (current) use of inhaled steroids: Secondary | ICD-10-CM | POA: Diagnosis not present

## 2017-12-21 DIAGNOSIS — Z8551 Personal history of malignant neoplasm of bladder: Secondary | ICD-10-CM | POA: Diagnosis not present

## 2017-12-21 DIAGNOSIS — I509 Heart failure, unspecified: Secondary | ICD-10-CM | POA: Diagnosis not present

## 2017-12-21 HISTORY — DX: Myoneural disorder, unspecified: G70.9

## 2017-12-21 HISTORY — DX: Cardiac arrhythmia, unspecified: I49.9

## 2017-12-21 HISTORY — DX: Nausea with vomiting, unspecified: R11.2

## 2017-12-21 HISTORY — DX: Other specified postprocedural states: Z98.890

## 2017-12-21 HISTORY — DX: Pneumonia, unspecified organism: J18.9

## 2017-12-21 LAB — BASIC METABOLIC PANEL
Anion gap: 7 (ref 5–15)
BUN: 29 mg/dL — ABNORMAL HIGH (ref 8–23)
CALCIUM: 9.1 mg/dL (ref 8.9–10.3)
CO2: 26 mmol/L (ref 22–32)
Chloride: 108 mmol/L (ref 98–111)
Creatinine, Ser: 1.05 mg/dL — ABNORMAL HIGH (ref 0.44–1.00)
GFR calc Af Amer: 54 mL/min — ABNORMAL LOW (ref >60)
GFR calc non Af Amer: 46 mL/min — ABNORMAL LOW (ref >60)
Glucose, Bld: 88 mg/dL (ref 70–99)
Potassium: 4.9 mmol/L (ref 3.5–5.1)
Sodium: 141 mmol/L (ref 135–145)

## 2017-12-21 LAB — CBC
HCT: 32.8 % — ABNORMAL LOW (ref 36.0–46.0)
Hemoglobin: 9.7 g/dL — ABNORMAL LOW (ref 12.0–15.0)
MCH: 22.9 pg — ABNORMAL LOW (ref 26.0–34.0)
MCHC: 29.6 g/dL — ABNORMAL LOW (ref 30.0–36.0)
MCV: 77.4 fL — AB (ref 80.0–100.0)
Platelets: 325 10*3/uL (ref 150–400)
RBC: 4.24 MIL/uL (ref 3.87–5.11)
RDW: 19 % — ABNORMAL HIGH (ref 11.5–15.5)
WBC: 9.7 10*3/uL (ref 4.0–10.5)
nRBC: 0 % (ref 0.0–0.2)

## 2017-12-21 NOTE — Progress Notes (Signed)
EKG-07/05/17-epic  CXR-11/09/2017-epic LOV- Cardiology 12/22/2017-epic

## 2017-12-21 NOTE — Progress Notes (Signed)
Cbc and bmp done 12/21/2017 faxed via epic to Dr Link Snuffer.

## 2017-12-22 ENCOUNTER — Encounter: Payer: Self-pay | Admitting: Cardiology

## 2017-12-22 ENCOUNTER — Ambulatory Visit (INDEPENDENT_AMBULATORY_CARE_PROVIDER_SITE_OTHER): Payer: Medicare Other | Admitting: Cardiology

## 2017-12-22 DIAGNOSIS — Z8679 Personal history of other diseases of the circulatory system: Secondary | ICD-10-CM

## 2017-12-22 DIAGNOSIS — Z7901 Long term (current) use of anticoagulants: Secondary | ICD-10-CM

## 2017-12-22 DIAGNOSIS — I5032 Chronic diastolic (congestive) heart failure: Secondary | ICD-10-CM | POA: Diagnosis not present

## 2017-12-22 DIAGNOSIS — Z01818 Encounter for other preprocedural examination: Secondary | ICD-10-CM | POA: Insufficient documentation

## 2017-12-22 DIAGNOSIS — J849 Interstitial pulmonary disease, unspecified: Secondary | ICD-10-CM

## 2017-12-22 NOTE — Assessment & Plan Note (Addendum)
Pt seen as a pre op clearance today prior to urologic procedure scheduled for 12/13

## 2017-12-22 NOTE — Assessment & Plan Note (Signed)
CHADS VASC=4, on Eliquis 

## 2017-12-22 NOTE — Patient Instructions (Signed)
Medication Instructions:  Kerin Ransom, PA-C recommends that you continue on your current medications as directed. Please refer to the Current Medication list given to you today.  If you need a refill on your cardiac medications before your next appointment, please call your pharmacy.   Follow-Up: At [facility], you and your health needs are our priority.  As part of our continuing mission to provide you with exceptional heart care, we have created designated Provider Care Teams.  These Care Teams include your primary Cardiologist (physician) and Advanced Practice Providers (APPs -  Physician Assistants and Nurse Practitioners) who all work together to provide you with the care you need, when you need it. You will need a follow up appointment in 6 months. You may see Kirk Ruths, MD or one of the following Advanced Practice Providers on your designated Care Team:   Kerin Ransom, PA-C Roby Lofts, [place] . Sande Rives, PA-C . You will receive a reminder letter in the mail two months in advance. If you don't receive a letter, please call our office to schedule the follow-up appointment.

## 2017-12-22 NOTE — Assessment & Plan Note (Signed)
Followed by [facility]- Amiodarone stopped previously 

## 2017-12-22 NOTE — Progress Notes (Signed)
12/22/2017 Crystal Kerr   Jan 23, 1924  [id number]  Primary Physician Copland, Gay Filler, MD Primary Cardiologist: dr [name]  HPI: Patient is a delightful 84 year old female seen today for preoperative clearance.  She has a history of PAF.  This was in 2015 in the setting of a viral illness.  She was placed on amiodarone and Eliquis at that time.  Amiodarone was later discontinued for possible interstitial lung disease.  She is followed by [facility].  This seems to be stable.  Her last echocardiogram was in 2015 and showed normal LV function with normal wall thickness and mild to moderate left atrial enlargement.  She carries a diagnosis of presumptive diastolic heart failure.  She has lower extremity edema unless she takes her diuretics.  She is on Eliquis.  She is maintaining sinus rhythm.  Patient has developed some hematuria.  She is followed by urology.  She tells me they suspect she may have bladder cancer.  Because of hematuria the patient has been taking her Eliquis only once a day.  I suggested she just discontinue this today and have her procedure done Friday.  She should resume this soon as it safe postoperatively.  The patient denies any history of coronary disease or MI.  She denies any chest pain concerning for angina and no unusual dyspnea.  She has not been taking her Lasix on a regular basis because she has had to keep several doctor's appointments.   She does have some swelling in her legs.  Current Outpatient Medications  Medication Sig Dispense Refill  . acetaminophen (TYLENOL) 325 MG tablet Take 2 tablets (650 mg total) by mouth every 6 (six) hours as needed for mild pain or headache. 30 tablet 0  . albuterol (PROAIR HFA) 108 (90 Base) MCG/ACT inhaler Inhale 2 puffs into the lungs every 4 (four) hours as needed for wheezing or shortness of breath. 18 g 5  . diltiazem (TIAZAC) 120 MG 24 hr capsule TAKE 1 CAPSULE BY MOUTH DAILY (Patient taking differently:  Take 120 mg by mouth daily. ) 90 capsule 3  . ELIQUIS 5 MG TABS tablet TAKE 1 TABLET(5 MG) BY MOUTH TWICE DAILY (Patient taking differently: Take 5 mg by mouth daily. ) 180 tablet 1  . famotidine (PEPCID) 20 MG tablet TAKE 1 TABLET(20 MG) BY MOUTH AT BEDTIME (Patient taking differently: Take 20 mg by mouth at bedtime. ) 90 tablet 1  . fluticasone (FLONASE) 50 MCG/ACT nasal spray Place 1 spray into both nostrils daily. 16 g 2  . fluticasone (FLOVENT HFA) 110 MCG/ACT inhaler Inhale 2 puffs into the lungs 2 (two) times daily.    . furosemide (LASIX) 20 MG tablet Take 1 tablet (20 mg total) by mouth daily. Use as needed for swelling 90 tablet 3  . mirabegron ER (MYRBETRIQ) 25 MG TB24 tablet Take 1 tablet (25 mg total) by mouth daily. Use for bladder symptoms 30 tablet 6  . montelukast (SINGULAIR) 10 MG tablet TAKE 1 TABLET BY MOUTH DAILY (Patient taking differently: Take 10 mg by mouth at bedtime. ) 90 tablet 0  . Multiple Vitamins-Minerals (MULTIVITAMIN WITH MINERALS) tablet Take 1 tablet by mouth daily.    . pantoprazole (PROTONIX) 40 MG tablet Take 1 tablet (40 mg total) by mouth daily before breakfast. 90 tablet 3  . SYMBICORT 160-4.5 MCG/ACT inhaler INHALE 2 PUFFS FIRST THING IN THE MORNING AND THEN 2 MORE PUFFS IN 60 HOURS (Patient taking differently: Inhale 2 puffs into the lungs 2 (two)  times daily. ) 10.2 g 2  . traMADol (ULTRAM) 50 MG tablet TAKE 1/2 TO 1 TABLET BY MOUTH EVERY 8 HOURS AS NEEDED FOR HIP PAIN (Patient taking differently: Take 50 mg by mouth every 8 (eight) hours as needed for moderate pain. TAKE 1/2 TO 1 TABLET BY MOUTH EVERY 8 HOURS AS NEEDED FOR HIP PAIN) 60 tablet 0  . triamcinolone cream (KENALOG) 0.1 % Apply 1 application topically 2 (two) times daily. Apply to rash on side as needed 30 g 1   No current facility-administered medications for this visit.     No Known Allergies  Past Medical History:  Diagnosis Date  . Arthritis   . Asthma   . Atrial fibrillation ([place])      a. s/p DCCV in 2015, on Amiodarone and Eliquis  . CHF (congestive heart failure) ([place])   . COPD (chronic obstructive pulmonary disease) ([place])   . Dysrhythmia    afib -hx   . Neuromuscular disorder ([place])    peripheral neuropathy   . Pneumonia    hx of x 2   . PONV (postoperative nausea and vomiting)   . PVC's (premature ventricular contractions)     Social History   Socioeconomic History  . Marital status: Divorced    Spouse name: Not on file  . Number of children: Not on file  . Years of education: Not on file  . Highest education level: Not on file  Occupational History  . Occupation: Retired    Fish farm manager: RETIRED  Social Needs  . Financial resource strain: Not on file  . Food insecurity:    Worry: Not on file    Inability: Not on file  . Transportation needs:    Medical: Not on file    Non-medical: Not on file  Tobacco Use  . Smoking status: Former Smoker    Packs/day: 1.00    Years: 20.00    Pack years: 20.00    Types: Cigarettes    Last attempt to quit: 04/13/1981    Years since quitting: 36.7  . Smokeless tobacco: Never Used  Substance and Sexual Activity  . Alcohol use: Yes    Comment: Occasional  . Drug use: No  . Sexual activity: Never  Lifestyle  . Physical activity:    Days per week: Not on file    Minutes per session: Not on file  . Stress: Not on file  Relationships  . Social connections:    Talks on phone: Not on file    Gets together: Not on file    Attends religious service: Not on file    Active member of club or organization: Not on file    Attends meetings of clubs or organizations: Not on file    Relationship status: Not on file  . Intimate partner violence:    Fear of current or ex partner: Not on file    Emotionally abused: Not on file    Physically abused: Not on file    Forced sexual activity: Not on file  Other Topics Concern  . Not on file  Social History Narrative  . Not on file     Family History  Problem Relation Age  of Onset  . Heart failure Mother   . Diabetes Father   . Heart disease Unknown        No family history     Review of Systems: General: negative for chills, fever, night sweats or weight changes.  Cardiovascular: negative for chest pain, dyspnea on  exertion,  orthopnea, palpitations, paroxysmal nocturnal dyspnea or shortness of breath Dermatological: negative for rash Respiratory: negative for cough or wheezing Urologic: negative for hematuria Abdominal: negative for nausea, vomiting, diarrhea, bright red blood per rectum, melena, or hematemesis Neurologic: negative for visual changes, syncope, or dizziness All other systems reviewed and are otherwise negative except as noted above.    Blood pressure (!) 138/52, pulse 64, height 5\' 3"  (1.6 m), weight 179 lb (81.2 kg).  General appearance: alert, cooperative, no distress and mildly obese Lungs: clear to auscultation bilaterally Heart: regular rate and rhythm Extremities: 1+ bilateral LE edema Skin: pale warm dry Neurologic: Grossly normal   ASSESSMENT AND PLAN:   Pre-operative clearance Pt seen as a pre op clearance today prior to urologic procedure scheduled for 12/13  History of atrial fibrillation PAF Feb 2015 in setting of viral illness. Amiodarone stopped secondary to possible interstitial lung disease. She remains on Eliquis, in NSR.  Current use of long term anticoagulation CHADS VASC= 4, on Eliquis.  Chronic diastolic heart failure (HCC) Her last echo was in 2015- normal LVF, normal wall thickness, mild to mod LAE.  ILD (interstitial lung disease) ([place]) Followed by [facility]- Amiodarone stopped previously   PLAN   Chart reviewed and patient seen and examined as part of pre-operative protocol coverage. Based on ACC/AHA guidelines, Crystal Kerr would be at acceptable risk for the planned procedure without further cardiovascular testing.   Eliquis held, resume ASAP post op.  I will route this  recommendation to the requesting party via Epic fax function and remove from pre-op pool.  Please call with questions.   Kerin Ransom PA-C 12/22/2017 3:20 PM

## 2017-12-22 NOTE — Assessment & Plan Note (Signed)
Her last echo was in 2015- normal LVF, normal wall thickness, mild to mod LAE.

## 2017-12-22 NOTE — Assessment & Plan Note (Signed)
PAF Feb 2015 in setting of viral illness. Amiodarone stopped secondary to possible interstitial lung disease. She remains on Eliquis, in NSR.

## 2017-12-23 NOTE — Progress Notes (Signed)
12/22/17- Preop clearance with Kerin Ransom PA- cardiology- epic.

## 2017-12-24 ENCOUNTER — Ambulatory Visit (HOSPITAL_COMMUNITY)
Admission: RE | Admit: 2017-12-24 | Discharge: 2017-12-24 | Disposition: A | Discharge status: Home / Self Care | Payer: Medicare Other | Attending: Urology | Admitting: Urology

## 2017-12-24 ENCOUNTER — Other Ambulatory Visit: Payer: Self-pay

## 2017-12-24 ENCOUNTER — Encounter (HOSPITAL_COMMUNITY): Payer: Self-pay | Admitting: *Deleted

## 2017-12-24 ENCOUNTER — Ambulatory Visit (HOSPITAL_COMMUNITY): Payer: Medicare Other | Admitting: Certified Registered"

## 2017-12-24 ENCOUNTER — Encounter (HOSPITAL_COMMUNITY)
Admission: RE | Disposition: A | Discharge status: Home / Self Care | Payer: Self-pay | Source: Home / Self Care | Attending: Urology

## 2017-12-24 DIAGNOSIS — R310 Gross hematuria: Secondary | ICD-10-CM | POA: Insufficient documentation

## 2017-12-24 DIAGNOSIS — Z7951 Long term (current) use of inhaled steroids: Secondary | ICD-10-CM | POA: Insufficient documentation

## 2017-12-24 DIAGNOSIS — Z7901 Long term (current) use of anticoagulants: Secondary | ICD-10-CM | POA: Insufficient documentation

## 2017-12-24 DIAGNOSIS — Z87891 Personal history of nicotine dependence: Secondary | ICD-10-CM | POA: Insufficient documentation

## 2017-12-24 DIAGNOSIS — C678 Malignant neoplasm of overlapping sites of bladder: Secondary | ICD-10-CM | POA: Diagnosis not present

## 2017-12-24 DIAGNOSIS — I509 Heart failure, unspecified: Secondary | ICD-10-CM | POA: Diagnosis not present

## 2017-12-24 DIAGNOSIS — Z79899 Other long term (current) drug therapy: Secondary | ICD-10-CM | POA: Diagnosis not present

## 2017-12-24 DIAGNOSIS — J449 Chronic obstructive pulmonary disease, unspecified: Secondary | ICD-10-CM | POA: Insufficient documentation

## 2017-12-24 DIAGNOSIS — I4891 Unspecified atrial fibrillation: Secondary | ICD-10-CM | POA: Insufficient documentation

## 2017-12-24 DIAGNOSIS — C679 Malignant neoplasm of bladder, unspecified: Secondary | ICD-10-CM | POA: Diagnosis not present

## 2017-12-24 DIAGNOSIS — I5033 Acute on chronic diastolic (congestive) heart failure: Secondary | ICD-10-CM | POA: Diagnosis not present

## 2017-12-24 DIAGNOSIS — I4819 Other persistent atrial fibrillation: Secondary | ICD-10-CM | POA: Diagnosis not present

## 2017-12-24 DIAGNOSIS — Z8551 Personal history of malignant neoplasm of bladder: Secondary | ICD-10-CM | POA: Insufficient documentation

## 2017-12-24 HISTORY — PX: TRANSURETHRAL RESECTION OF BLADDER TUMOR WITH MITOMYCIN-C: SHX6459

## 2017-12-24 SURGERY — TRANSURETHRAL RESECTION OF BLADDER TUMOR WITH MITOMYCIN-C
Anesthesia: General

## 2017-12-24 MED ORDER — FENTANYL CITRATE (PF) 100 MCG/2ML IJ SOLN
25.0000 ug | INTRAMUSCULAR | Status: DC | PRN
  Administered 2017-12-24 (×4): 25 ug via INTRAVENOUS

## 2017-12-24 MED ORDER — DIPHENHYDRAMINE HCL 50 MG/ML IJ SOLN
INTRAMUSCULAR | Status: DC | PRN
  Administered 2017-12-24: 12.5 mg via INTRAVENOUS

## 2017-12-24 MED ORDER — SUGAMMADEX SODIUM 200 MG/2ML IV SOLN
INTRAVENOUS | Status: DC | PRN
  Administered 2017-12-24: 175 mg via INTRAVENOUS

## 2017-12-24 MED ORDER — PROPOFOL 10 MG/ML IV BOLUS
INTRAVENOUS | Status: AC
  Filled 2017-12-24: qty 20

## 2017-12-24 MED ORDER — ROCURONIUM BROMIDE 10 MG/ML (PF) SYRINGE
PREFILLED_SYRINGE | INTRAVENOUS | Status: DC | PRN
  Administered 2017-12-24: 30 mg via INTRAVENOUS

## 2017-12-24 MED ORDER — GEMCITABINE CHEMO FOR BLADDER INSTILLATION 2000 MG
2000.0000 mg | Freq: Once | INTRAVENOUS | Status: AC
  Administered 2017-12-24: 2000 mg via INTRAVESICAL
  Filled 2017-12-24: qty 2000

## 2017-12-24 MED ORDER — LIDOCAINE 2% (20 MG/ML) 5 ML SYRINGE
INTRAMUSCULAR | Status: DC | PRN
  Administered 2017-12-24: 75 mg via INTRAVENOUS

## 2017-12-24 MED ORDER — HYDROCODONE-ACETAMINOPHEN 5-325 MG PO TABS: 1.0000 | ORAL_TABLET | ORAL | 0 refills | Status: DC | PRN

## 2017-12-24 MED ORDER — SUGAMMADEX SODIUM 200 MG/2ML IV SOLN
INTRAVENOUS | Status: AC
  Filled 2017-12-24: qty 2

## 2017-12-24 MED ORDER — SODIUM CHLORIDE 0.9 % IR SOLN
Status: DC | PRN
  Administered 2017-12-24: 6000 mL via INTRAVESICAL

## 2017-12-24 MED ORDER — FENTANYL CITRATE (PF) 100 MCG/2ML IJ SOLN
INTRAMUSCULAR | Status: AC
  Filled 2017-12-24: qty 2

## 2017-12-24 MED ORDER — LIDOCAINE 2% (20 MG/ML) 5 ML SYRINGE
INTRAMUSCULAR | Status: AC
  Filled 2017-12-24: qty 5

## 2017-12-24 MED ORDER — CEFAZOLIN SODIUM-DEXTROSE 2-4 GM/100ML-% IV SOLN
2.0000 g | Freq: Once | INTRAVENOUS | Status: AC
  Administered 2017-12-24: 2 g via INTRAVENOUS
  Filled 2017-12-24: qty 100

## 2017-12-24 MED ORDER — ROCURONIUM BROMIDE 10 MG/ML (PF) SYRINGE
PREFILLED_SYRINGE | INTRAVENOUS | Status: AC
  Filled 2017-12-24: qty 10

## 2017-12-24 MED ORDER — STERILE WATER FOR IRRIGATION IR SOLN
Status: DC | PRN
  Administered 2017-12-24: 500 mL

## 2017-12-24 MED ORDER — FENTANYL CITRATE (PF) 100 MCG/2ML IJ SOLN
INTRAMUSCULAR | Status: DC | PRN
  Administered 2017-12-24 (×2): 50 ug via INTRAVENOUS

## 2017-12-24 MED ORDER — ONDANSETRON HCL 4 MG/2ML IJ SOLN
INTRAMUSCULAR | Status: DC | PRN
  Administered 2017-12-24: 4 mg via INTRAVENOUS

## 2017-12-24 MED ORDER — DIPHENHYDRAMINE HCL 50 MG/ML IJ SOLN
INTRAMUSCULAR | Status: AC
  Filled 2017-12-24: qty 1

## 2017-12-24 MED ORDER — LACTATED RINGERS IV SOLN
INTRAVENOUS | Status: DC
  Administered 2017-12-24: 12:00:00 via INTRAVENOUS

## 2017-12-24 MED ORDER — EPHEDRINE SULFATE-NACL 50-0.9 MG/10ML-% IV SOSY
PREFILLED_SYRINGE | INTRAVENOUS | Status: DC | PRN
  Administered 2017-12-24 (×2): 10 mg via INTRAVENOUS

## 2017-12-24 MED ORDER — DEXAMETHASONE SODIUM PHOSPHATE 10 MG/ML IJ SOLN
INTRAMUSCULAR | Status: DC | PRN
  Administered 2017-12-24: 10 mg via INTRAVENOUS

## 2017-12-24 MED ORDER — PROPOFOL 10 MG/ML IV BOLUS
INTRAVENOUS | Status: DC | PRN
  Administered 2017-12-24: 120 mg via INTRAVENOUS

## 2017-12-24 MED ORDER — EPHEDRINE 5 MG/ML INJ
INTRAVENOUS | Status: AC
  Filled 2017-12-24: qty 10

## 2017-12-24 SURGICAL SUPPLY — 16 items
BAG URINE DRAINAGE (UROLOGICAL SUPPLIES) ×2 IMPLANT
BAG URO CATCHER STRL LF (MISCELLANEOUS) ×3 IMPLANT
CATH FOLEY 2WAY SLVR  5CC 20FR (CATHETERS) ×2
CATH FOLEY 2WAY SLVR 5CC 20FR (CATHETERS) ×1 IMPLANT
COVER WAND RF STERILE (DRAPES) IMPLANT
ELECT REM PT RETURN 15FT ADLT (MISCELLANEOUS) IMPLANT
GLOVE BIO SURGEON STRL SZ7.5 (GLOVE) ×3 IMPLANT
GOWN STRL REUS W/TWL LRG LVL3 (GOWN DISPOSABLE) ×3 IMPLANT
LOOP CUT BIPOLAR 24F LRG (ELECTROSURGICAL) ×2 IMPLANT
MANIFOLD NEPTUNE II (INSTRUMENTS) ×3 IMPLANT
PACK CYSTO (CUSTOM PROCEDURE TRAY) ×3 IMPLANT
SET ASPIRATION TUBING (TUBING) IMPLANT
SYRINGE IRR TOOMEY STRL 70CC (SYRINGE) IMPLANT
TUBING CONNECTING 10 (TUBING) ×2 IMPLANT
TUBING CONNECTING 10' (TUBING) ×1
TUBING UROLOGY SET (TUBING) ×3 IMPLANT

## 2017-12-24 NOTE — Op Note (Signed)
Operative Note  Preoperative diagnosis:  1.  Bladder cancer  Postoperative diagnosis: 1.  Bladder cancer  Procedure(s): 1.  Transurethral resection of bladder tumor--medium 2.  Intravesical instillation of chemotherapy--gemcitabine  Surgeon: Link Snuffer, MD  Assistants: None  Anesthesia: General  Complications: None immediate  EBL: Minimal  Specimens: 1.  Bladder tumor  Drains/Catheters: 1.  20 French urethral catheter  Intraoperative findings: 1.  Normal urethra 2.  Multiple bladder tumors throughout the bladder ranging from subcentimeter up to 2 cm.  Bilateral ureteral orifices were well away from resection.  All tumor appeared to be completely resected  Indication: 84 year old female status post TURBT about a year and a half ago at an outside facility.  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  She was lost to follow-up and presented to my office with gross hematuria.  She presents for the previously mentioned operation.  Description of procedure:  The patient was identified and consent was obtained.  The patient was taken to the operating room and placed in the supine position.  The patient was placed under general anesthesia.  Perioperative antibiotics were administered.  The patient was placed in dorsal lithotomy.  Patient was prepped and draped in a standard sterile fashion and a timeout was performed.  A 26 French resectoscope with a visual obturator in place was advanced into the urethra and into the bladder.  Complete cystoscopy was performed with findings noted above.  The visual obturator was exchanged for the working element.  On bipolar settings, the masses of interest were resected down the muscle fibers and all of the resection beds were fulgurated.  I collected all the specimen for permanent specimen.  I reinspected the bladder mucosa and no other bladder tumors were seen.  No active bleeding was noted.  I therefore  withdrew the scope and placed a 20 French urethral catheter.  This included the operation.  The patient tolerated the procedure well and was stable postoperatively.  In the PACU, gemcitabine was instilled into the bladder and remained for approximately 2 hours at which point the gemcitabine was properly disposed of.  Plan: Follow-up in 1 week for pathology review.

## 2017-12-24 NOTE — Anesthesia Preprocedure Evaluation (Signed)
Anesthesia Evaluation  Patient identified by MRN, date of birth, ID band Patient awake    Reviewed: Allergy & Precautions, NPO status , Patient's Chart, lab work & pertinent test results  History of Anesthesia Complications (+) PONV  Airway Mallampati: I  TM Distance: >3 FB Neck ROM: Full    Dental  (+) Edentulous Upper, Edentulous Lower, Dental Advisory Given   Pulmonary COPD, former smoker,    Pulmonary exam normal        Cardiovascular Normal cardiovascular exam+ dysrhythmias Atrial Fibrillation      Neuro/Psych negative neurological ROS  negative psych ROS   GI/Hepatic negative GI ROS, Neg liver ROS,   Endo/Other  negative endocrine ROS  Renal/GU negative Renal ROS     Musculoskeletal   Abdominal   Peds  Hematology   Anesthesia Other Findings   Reproductive/Obstetrics                             Anesthesia Physical Anesthesia Plan  ASA: III  Anesthesia Plan: General   Post-op Pain Management:    Induction: Intravenous  PONV Risk Score and Plan: 4 or greater and Ondansetron, Dexamethasone and Diphenhydramine  Airway Management Planned: LMA  Additional Equipment:   Intra-op Plan:   Post-operative Plan: Extubation in OR  Informed Consent: I have reviewed the patients History and Physical, chart, labs and discussed the procedure including the risks, benefits and alternatives for the proposed anesthesia with the patient or authorized representative who has indicated his/her understanding and acceptance.   Dental advisory given  Plan Discussed with: CRNA and Anesthesiologist  Anesthesia Plan Comments:         Anesthesia Quick Evaluation

## 2017-12-24 NOTE — Discharge Instructions (Addendum)

## 2017-12-24 NOTE — Anesthesia Procedure Notes (Signed)
Procedure Name: Intubation Date/Time: 12/24/2017 2:03 PM Performed by: Giannina Bartolome D, CRNA Pre-anesthesia Checklist: Patient identified, Emergency Drugs available, Suction available and Patient being monitored Patient Re-evaluated:Patient Re-evaluated prior to induction Oxygen Delivery Method: Circle system utilized Preoxygenation: Pre-oxygenation with 100% oxygen Induction Type: IV induction Ventilation: Mask ventilation without difficulty Laryngoscope Size: Mac and 3 Tube type: Oral Tube size: 7.5 mm Number of attempts: 1 Airway Equipment and Method: Stylet Placement Confirmation: ETT inserted through vocal cords under direct vision,  positive ETCO2 and breath sounds checked- equal and bilateral Secured at: 20 cm Tube secured with: Tape Dental Injury: Teeth and Oropharynx as per pre-operative assessment

## 2017-12-24 NOTE — Transfer of Care (Signed)
Immediate Anesthesia Transfer of Care Note  Patient: Crystal Kerr  Procedure(s) Performed: TRANSURETHRAL RESECTION OF BLADDER TUMOR (N/A )  Patient Location: PACU  Anesthesia Type:General  Level of Consciousness: awake and alert   Airway & Oxygen Therapy: Patient Spontanous Breathing and Patient connected to face mask oxygen  Post-op Assessment: Report given to RN and Post -op Vital signs reviewed and stable  Post vital signs: Reviewed and stable  Last Vitals:  Vitals Value Taken Time  BP    Temp    Pulse    Resp    SpO2      Last Pain:  Vitals:   12/24/17 1226  TempSrc:   PainSc: 0-No pain      Patients Stated Pain Goal: 3 (83/16/74 2552)  Complications: No apparent anesthesia complications

## 2017-12-24 NOTE — Anesthesia Postprocedure Evaluation (Signed)
Anesthesia Post Note  Patient: Crystal Kerr  Procedure(s) Performed: TRANSURETHRAL RESECTION OF BLADDER TUMOR (N/A )     Patient location during evaluation: PACU Anesthesia Type: General Level of consciousness: sedated Pain management: pain level controlled Vital Signs Assessment: post-procedure vital signs reviewed and stable Respiratory status: spontaneous breathing and respiratory function stable Cardiovascular status: stable Postop Assessment: no apparent nausea or vomiting Anesthetic complications: no    Last Vitals:  Vitals:   12/24/17 1630 12/24/17 1645  BP: (!) 159/64 (!) 172/63  Pulse: 65 66  Resp: 19 20  Temp: 36.6 C   SpO2: 94% 91%    Last Pain:  Vitals:   12/24/17 1700  TempSrc:   PainSc: Asleep                 Sid Greener DANIEL

## 2017-12-24 NOTE — Progress Notes (Signed)
Per DR. Bell, pt is to resume Eliquis Monday as long as there is no blood in her urine.

## 2017-12-24 NOTE — H&P (Signed)
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 These were apparently all superficial tumors. According to prior notes, she had upwards of 30 tumors in the bladder and all were completely resected. This was performed by Dr. Thomasene Mohair at [facility]. Last office note from [facility] was on 05/18/2016 at which point pathology was reviewed with the patient. It looks like BCG 6 was recommended. The patient however never had this done. She has not had a cystoscopy since then. She has not had a recent CT scan. Most recent creatinine was 1.22 with a GFR of 43 on 11/23/2017. Since Saturday, the patient has had intermittent gross hematuria. She is on elliquis for a history of atrial fibrillation. Her cardiologist is [name].     ALLERGIES: Myrbetriq - ineffective    MEDICATIONS: Acetaminophen  Acidophilus  Albuterol Sulfate  Diltiazem 24Hr Er (Cd) 120 mg capsule, ext release 24 hr  Eliquis 5 mg tablet  Famotidine 20 mg tablet  Fluticasone Propionate  Furosemide 20 mg tablet  Montelukast Sodium  Pantoprazole Sodium 40 mg tablet, delayed release  Symbicort  Tramadol Hcl 50 mg tablet  Triamcinolone Acetonide 0.025 % cream     GU PSH: Ovary Removal Partial or Total, Right    NON-GU PSH: Anesth, Bladder Surgery Appendectomy (laparoscopic) Cholecystectomy (laparoscopic)    GU PMH: History of bladder cancer    NON-GU PMH: Arthritis Asthma Atrial Fibrillation Congestive heart failure COPD    FAMILY HISTORY: stroke - Father   SOCIAL HISTORY: Marital Status: Divorced Preferred Language: English; Race: White Current Smoking Status: Patient does not smoke anymore.   Tobacco Use Assessment Completed: Used Tobacco in last 30 days? Drinks 2 caffeinated drinks per day.     Notes: sons, 2 daughters   REVIEW OF SYSTEMS:    GU Review Female:    Patient reports get up at night to urinate and leakage of urine. Patient denies frequent urination, hard to postpone urination, burning /pain with urination, stream starts and stops, trouble starting your stream, have to strain to urinate, and being pregnant.  Gastrointestinal (Upper):   Patient denies nausea, vomiting, and indigestion/ heartburn.  Gastrointestinal (Lower):   Patient reports constipation. Patient denies diarrhea.  Constitutional:   Patient reports fatigue. Patient denies fever, night sweats, and weight loss.  Skin:   Patient denies skin rash/ lesion and itching.  Eyes:   Patient denies blurred vision and double vision.  Ears/ Nose/ Throat:   Patient denies sore throat and sinus problems.  Hematologic/Lymphatic:   Patient denies swollen glands and easy bruising.  Cardiovascular:   Patient denies leg swelling and chest pains.  Respiratory:   Patient reports shortness of breath. Patient denies cough.  Endocrine:   Patient denies excessive thirst.  Musculoskeletal:   Patient reports back pain and joint pain.   Neurological:   Patient denies headaches and dizziness.  Psychologic:   Patient denies depression and anxiety.   VITAL SIGNS:      12/16/2017 11:31 AM  Weight 173 lb / 78.47 kg  Height 63 in / 160.02 cm  BP 106/67 mmHg  Pulse 76 /min  BMI 30.6 kg/m   MULTI-SYSTEM PHYSICAL EXAMINATION:    Constitutional: Elderly female in a wheelchair  Respiratory: No labored breathing, no use of accessory muscles.   Cardiovascular: Normal temperature, adequate perfusion of extremities  Skin: No paleness, no jaundice  Neurologic / Psychiatric: Oriented to time, oriented to place, oriented to person. No depression, no anxiety, no agitation.  Gastrointestinal: No mass, no tenderness, no rigidity, non obese abdomen.  Eyes: Normal conjunctivae. Normal eyelids.  Musculoskeletal: In a wheelchair     PAST DATA REVIEWED:  Source Of History:  Patient  Lab Test Review:   BMP, Path Report   Records Review:   Previous Patient Records  Notes:                     I reviewed the previous notes from [facility].   PROCEDURES:         Flexible Cystoscopy - 52000  Risks, benefits, and some of the potential complications of the procedure were discussed at length with the patient including infection, bleeding, voiding discomfort, urinary retention, fever, chills, sepsis, and others. All questions were answered. Informed consent was obtained. Antibiotic prophylaxis was given. Sterile technique and intraurethral analgesia were used.  Meatus:  Normal size. Normal location. Normal condition.  Urethra:  Normal urethra  Ureteral Orifices:  Did not appear to be involved by tumor  Bladder:  The patient had some gross hematuria which therefore impeded visualization. I was however able to identify several papillary masses, the largest of which was about 2 cm that I saw. It appeared to be superficial at least the ones that are visualized.      The lower urinary tract was carefully examined. The procedure was well-tolerated and without complications. Antibiotic instructions were given. Instructions were given to call the office immediately for bloody urine, difficulty urinating, urinary retention, painful or frequent urination, fever, chills, nausea, vomiting or other illness. The patient stated that she understood these instructions and would comply with them.   ASSESSMENT:      ICD-10 Details  1 GU:   Bladder Cancer overlapping sites - C67.8   2   Gross hematuria - R31.0    PLAN:           Orders         Schedule X-Rays: 1 Week - C.T. Hematuria With and Without I.V. Contrast  Return Visit/Planned Activity: 1 Week - C.T. Hematuria          Document Letter(s):  Created for Patient: Clinical Summary         Notes:   The patient has unfortunately not had appropriate follow-up. Cystoscopy today revealed evidence of recurrence of bladder cancer. Recommend transurethral resection of bladder tumor with  possible instillation of intravesical chemotherapy agent. Plan to obtain a staging CT scan prior to surgery consisting of a CT IVP. She understands potential risks including but not limited to bleeding, infection, injury to surrounding structures, need for additional procedures, potential need for prolonged catheterization, potential for bladder perforation, potential risks of anesthesia.   Cc: Lamar Blinks, M.D.    Signed by Link Snuffer, III, M.D. on 12/16/17 at 12:38 PM (EST

## 2017-12-25 ENCOUNTER — Encounter (HOSPITAL_COMMUNITY): Payer: Self-pay | Admitting: Urology

## 2017-12-28 ENCOUNTER — Ambulatory Visit: Payer: Medicare Other | Admitting: Cardiology

## 2017-12-29 ENCOUNTER — Other Ambulatory Visit: Payer: Self-pay | Admitting: Family Medicine

## 2018-01-01 ENCOUNTER — Other Ambulatory Visit: Payer: Self-pay | Admitting: Family Medicine

## 2018-01-04 DIAGNOSIS — C678 Malignant neoplasm of overlapping sites of bladder: Secondary | ICD-10-CM | POA: Diagnosis not present

## 2018-01-18 ENCOUNTER — Encounter: Payer: Self-pay | Admitting: Adult Health

## 2018-01-18 ENCOUNTER — Ambulatory Visit (INDEPENDENT_AMBULATORY_CARE_PROVIDER_SITE_OTHER)
Admission: RE | Admit: 2018-01-18 | Discharge: 2018-01-18 | Disposition: A | Discharge status: Home / Self Care | Payer: Medicare Other | Source: Ambulatory Visit | Attending: Adult Health | Admitting: Adult Health

## 2018-01-18 ENCOUNTER — Ambulatory Visit (INDEPENDENT_AMBULATORY_CARE_PROVIDER_SITE_OTHER): Payer: Medicare Other | Admitting: Adult Health

## 2018-01-18 VITALS — BP 128/72 | HR 72 | Ht 63.0 in | Wt 176.2 lb

## 2018-01-18 DIAGNOSIS — J209 Acute bronchitis, unspecified: Secondary | ICD-10-CM | POA: Diagnosis not present

## 2018-01-18 DIAGNOSIS — I5033 Acute on chronic diastolic (congestive) heart failure: Secondary | ICD-10-CM | POA: Diagnosis not present

## 2018-01-18 DIAGNOSIS — J849 Interstitial pulmonary disease, unspecified: Secondary | ICD-10-CM

## 2018-01-18 DIAGNOSIS — J45909 Unspecified asthma, uncomplicated: Secondary | ICD-10-CM

## 2018-01-18 DIAGNOSIS — R0602 Shortness of breath: Secondary | ICD-10-CM | POA: Diagnosis not present

## 2018-01-18 MED ORDER — PREDNISONE 10 MG PO TABS: ORAL_TABLET | ORAL | 0 refills | Status: DC

## 2018-01-18 NOTE — Patient Instructions (Addendum)
Prednisone 20mg  daily for 4 days, take with food  Mucinex DM Twice daily  As needed  Cough/congestion  Decrease Salt intake .  Take Lasix 20mg  daily for 3 days then As needed  For swelling .  Take Flovent 1 puff Twice daily  For 1 week.  Continue on Symbicort 2 puffs Twice daily  .  Chest xray today .  Follow up with PCP for Anemia .  Follow up with  Dr. [name]  Or Karolina Zamor NP 2 weeks and As needed  Please contact office for sooner follow up if symptoms do not improve or worsen or seek emergency care

## 2018-01-18 NOTE — Progress Notes (Signed)
Reviewed and agree with assessment/plan.   Baneza Bartoszek, MD [facility] 01/08/2016, 12:24 PM Pager:  [phone]  

## 2018-01-18 NOTE — Progress Notes (Signed)
@Patient  ID: Crystal Kerr, female    DOB: Oct 22, 1924, 85 y.o.   MRN: [id number]  Chief Complaint  Patient presents with  . Follow-up    Asthma     Referring provider: [name], MD  HPI:                                                                                                                                                        85 year old female former seen in May 2018 after a pneumonia complicated by rhinovirus.  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    TEST/EVENTS :  Echo 2015 EF 65%, Gr 2 DD , Mod Dilated RA  PFTs 03/20/14 FEV1 1.28 (85%) ratio 75 s resp to saba and dlco 68 corrects to 103  CT chest Jun 02, 2016 showed groundglass opacities and clustered nodules compatible with an acute pneumonia. There is suggestion of cavitation with a larger nodules although this is difficult to distinguish from the bronchiectatic changes.  CT chest September 22, 2016 showed market improvement in the multifocal bilateralcentrallyairspace disease on previous study.  02/2017 -Tx for PNA andpossible amiodarone pulmonary toxicity. Elevated sed rate at 99. Amiodarone was discontinued High-resolution CT chest February 2019> extensive patchy groundglass opacities throughout both lungs involving all lung lobes largely new since September 2018 with mild mediastinal lymphadenopathy  HRCT August 17, 2017> shows some residual groundglass attenuation and scattered areas of mild bronchiectasis.  No honeycombing.  Several regions of groundglass attenuation on prior study have resolved.  01/18/2018 Follow up : Asthma , ILD , D CHF  Patient returns for a 42-month follow-up.  Patient has underlying asthma, interstitial lung disease and diastolic heart failure. She says overall her breathing is doing okay until last week. Developed a URI with cold symptoms of nasal congestion, clear drainage and cough . Now more wheezing and increased dyspnea with activity . Cough is dry . No discolored mucus or fever. Appetite is good.  She remains on Symbicort twice daily.,  Singulair and Flonase daily. She was given flovent by PCP to use during flares. She started this couple of days ago.   She has diastolic heart failure.  She is on Lasix daily As needed  . Has not used in long time.  Swelling has been up and down.  No significant change in weight.  No orthopnea. More DOE x 1 week.     No Known Allergies  Immunization History  Administered Date(s) Administered  . Influenza Split 10/11/2011, 11/28/2012, 10/02/2016  . Influenza, High Dose Seasonal PF 11/23/2017  . Influenza,inj,Quad PF,6+ Mos 11/07/2014, 09/12/2015  . Influenza-Unspecified 11/19/2013  . Pneumococcal Conjugate-13 12/25/2015  . Pneumococcal Polysaccharide-23 01/13/2008    Past Medical History:  Diagnosis Date  . Arthritis   . Asthma   . Atrial fibrillation ([place])    a. s/p DCCV in 2015, on Amiodarone and Eliquis  . CHF (congestive heart failure) ([place])   . COPD (chronic obstructive pulmonary disease) ([place])   . Dysrhythmia    afib -hx   . Neuromuscular disorder ([place])    peripheral neuropathy   . Pneumonia    hx of x 2   . PONV (postoperative nausea and vomiting)   . PVC's (premature ventricular contractions)     Tobacco History: Social History   Tobacco Use  Smoking Status Former Smoker  . Packs/day: 1.00  . Years: 20.00  . Pack years: 20.00  . Types: Cigarettes  . Last attempt to quit:  04/13/1981  . Years since quitting: 36.7  Smokeless Tobacco Never Used   Counseling given: Not Answered   Outpatient Medications Prior to Visit  Medication Sig Dispense Refill  . acetaminophen (TYLENOL) 325 MG tablet Take 2 tablets (650 mg total) by mouth every 6 (six) hours as needed for mild pain or headache. 30 tablet 0  . albuterol (PROAIR HFA) 108 (90 Base) MCG/ACT inhaler Inhale 2 puffs into the lungs every 4 (four) hours as needed for wheezing or shortness of breath. 18 g 5  . budesonide-formoterol (SYMBICORT) 160-4.5 MCG/ACT inhaler Inhale 2 puffs into the lungs 2 (two) times daily. 10.2 Inhaler 2  . diltiazem (TIAZAC) 120 MG 24 hr capsule TAKE 1 CAPSULE BY MOUTH DAILY (Patient taking differently: Take 120 mg by mouth daily. ) 90 capsule 3  . ELIQUIS 5 MG TABS tablet TAKE 1 TABLET(5 MG) BY MOUTH TWICE DAILY (Patient taking differently: Take 5 mg by mouth daily. ) 180 tablet 1  . famotidine (PEPCID) 20 MG tablet TAKE 1 TABLET(20 MG) BY MOUTH AT BEDTIME (Patient taking differently: Take 20 mg by mouth at bedtime. ) 90 tablet 1  . fluticasone (FLONASE) 50 MCG/ACT nasal spray Place 1 spray into both nostrils daily. 16 g 2  . fluticasone (FLOVENT HFA) 110 MCG/ACT inhaler Inhale 2 puffs into the lungs 2 (two) times daily.    . furosemide (LASIX) 20 MG tablet Take 1 tablet (20 mg total) by mouth daily. Use as needed for swelling 90 tablet 3  . HYDROcodone-acetaminophen (NORCO/VICODIN) 5-325 MG tablet Take  1 tablet by mouth every 4 (four) hours as needed for moderate pain. 6 tablet 0  . mirabegron ER (MYRBETRIQ) 25 MG TB24 tablet Take 1 tablet (25 mg total) by mouth daily. Use for bladder symptoms 30 tablet 6  . montelukast (SINGULAIR) 10 MG tablet TAKE 1 TABLET BY MOUTH DAILY 90 tablet 0  . Multiple Vitamins-Minerals (MULTIVITAMIN WITH MINERALS) tablet Take 1 tablet by mouth daily.    . pantoprazole (PROTONIX) 40 MG tablet Take 1 tablet (40 mg total) by mouth daily before breakfast. 90 tablet 3    . traMADol (ULTRAM) 50 MG tablet TAKE 1/2 TO 1 TABLET BY MOUTH EVERY 8 HOURS AS NEEDED FOR HIP PAIN (Patient taking differently: Take 50 mg by mouth every 8 (eight) hours as needed for moderate pain. TAKE 1/2 TO 1 TABLET BY MOUTH EVERY 8 HOURS AS NEEDED FOR HIP PAIN) 60 tablet 0  . triamcinolone cream (KENALOG) 0.1 % Apply 1 application topically 2 (two) times daily. Apply to rash on side as needed 30 g 1   No facility-administered medications prior to visit.      Review of Systems:   Constitutional:   No  weight loss, night sweats,  Fevers, chills,  +fatigue, or  lassitude.  HEENT:   No headaches,  Difficulty swallowing,  Tooth/dental problems, or  Sore throat,                No sneezing, itching, ear ache, nasal congestion, post nasal drip,   CV:  No chest pain,  Orthopnea, PND, +swelling in lower extremities, anasarca, dizziness, palpitations, syncope.   GI  No heartburn, indigestion, abdominal pain, nausea, vomiting, diarrhea, change in bowel habits, loss of appetite, bloody stools.   Resp:   No chest wall deformity  Skin: no rash or lesions.  GU: no dysuria, change in color of urine, no urgency or frequency.  No flank pain, no hematuria   MS:  No joint pain or swelling.  No decreased range of motion.  No back pain.    Physical Exam  BP 128/72 (BP Location: Left Arm, Cuff Size: Normal)   Pulse 72   Ht 5\' 3"  (1.6 m)   Wt 176 lb 3.2 oz (79.9 kg)   SpO2 95%   BMI 31.21 kg/m   GEN: A/Ox3; pleasant , NAD, elderly    HEENT:  [place]/AT,  EACs-clear, TMs-wnl, NOSE-clear, THROAT-clear, no lesions, no postnasal drip or exudate noted.   NECK:  Supple w/ fair ROM; no JVD; normal carotid impulses w/o bruits; no thyromegaly or nodules palpated; no lymphadenopathy.    RESP  Few exp wheezes, speaks in full sentences, no stridor   no accessory muscle use, no dullness to percussion  CARD:  RRR, no m/r/g, 1+ peripheral edema, pulses intact, no cyanosis or clubbing.  GI:   Soft & nt;  nml bowel sounds; no organomegaly or masses detected.   Musco: Warm bil, no deformities or joint swelling noted.   Neuro: alert, no focal deficits noted.    Skin: Warm, no lesions or rashes    Lab Results:  CBC    Component Value Date/Time   WBC 9.7 12/21/2017 0839   RBC 4.24 12/21/2017 0839   HGB 9.7 (L) 12/21/2017 0839   HGB 10.2 (L) 03/03/2017 1015   HCT 32.8 (L) 12/21/2017 0839   HCT 33.3 (L) 03/03/2017 1015   PLT 325 12/21/2017 0839   PLT 467 (H) 03/03/2017 1015   MCV 77.4 (L) 12/21/2017 0839   MCV 77 (L)  03/03/2017 1015   MCH 22.9 (L) 12/21/2017 0839   MCHC 29.6 (L) 12/21/2017 0839   RDW 19.0 (H) 12/21/2017 0839   RDW 16.7 (H) 03/03/2017 1015   LYMPHSABS 2.3 11/23/2017 1628   LYMPHSABS 1.7 03/03/2017 1015   MONOABS 1.1 (H) 11/23/2017 1628   EOSABS 0.1 11/23/2017 1628   EOSABS 0.3 03/03/2017 1015   BASOSABS 0.1 11/23/2017 1628   BASOSABS 0.0 03/03/2017 1015    BMET    Component Value Date/Time   NA 141 12/21/2017 0839   NA 141 03/03/2017 1015   K 4.9 12/21/2017 0839   CL 108 12/21/2017 0839   CO2 26 12/21/2017 0839   GLUCOSE 88 12/21/2017 0839   BUN 29 (H) 12/21/2017 0839   BUN 19 03/03/2017 1015   CREATININE 1.05 (H) 12/21/2017 0839   CREATININE 1.25 (H) 01/02/2014 1722   CALCIUM 9.1 12/21/2017 0839   CALCIUM 9.3 04/22/2011 0000   GFRNONAA 46 (L) 12/21/2017 0839   GFRNONAA 49 (L) 04/22/2011 0000   GFRAA 54 (L) 12/21/2017 0839   GFRAA 56 (L) 04/22/2011 0000    BNP    Component Value Date/Time   BNP 120.1 (H) 08/11/2016 1439   BNP 176.2 (H) 06/02/2016 1659   BNP 86.4 07/15/2013 1442    ProBNP    Component Value Date/Time   PROBNP 104.0 (H) 02/15/2013 1323    Imaging: Dg Chest 2 View  Result Date: 01/18/2018 CLINICAL DATA:  Shortness of breath. EXAM: CHEST - 2 VIEW COMPARISON:  11/09/2017. 03/03/2017.  CT 08/17/2017. FINDINGS: Mediastinum hilar structures normal. Stable cardiomegaly with normal pulmonary vascularity. Diffuse bilateral from  interstitial prominence most consistent chronic interstitial lung disease. No interim change. No focal alveolar infiltrate. No pleural effusion or pneumothorax. Degenerative changes scoliosis thoracolumbar spine. IMPRESSION: Number changes of chronic interstitial lung disease again noted. Similar findings noted on prior exam. No focal alveolar infiltrate noted. 2.  Stable cardiomegaly. Electronically Signed   By: Marcello Moores  Register   On: 01/18/2018 11:20      PFT Results Latest Ref Rng & Units 03/19/2014  FVC-Pre L 1.58  FVC-Predicted Pre % 77  FVC-Post L 1.70  FVC-Predicted Post % 83  Pre FEV1/FVC % % 74  Post FEV1/FCV % % 75  FEV1-Pre L 1.17  FEV1-Predicted Pre % 78  FEV1-Post L 1.28  DLCO UNC% % 68  DLCO COR %Predicted % 103  TLC L 3.43  TLC % Predicted % 72  RV % Predicted % 17    No results found for: NITRICOXIDE      Assessment & Plan:   Intrinsic asthma Flare with recent URI - will treat with short course of steroids and pulmonary hygiene  Hold on abx for now, check chest xray today .  Plan  Patient Instructions  Prednisone 20mg  daily for 4 days, take with food  Mucinex DM Twice daily  As needed  Cough/congestion  Decrease Salt intake .  Take Lasix 20mg  daily for 3 days then As needed  For swelling .  Take Flovent 1 puff Twice daily  For 1 week.  Continue on Symbicort 2 puffs Twice daily  .  Chest xray today .  Follow up with PCP for Anemia .  Follow up with  Dr. [name]  Or  NP 2 weeks and As needed  Please contact office for sooner follow up if symptoms do not improve or worsen or seek emergency care       Acute on chronic diastolic congestive heart failure Very mild flare,  does not appear to be in overt fluid overload  Diuresis gently   Plan  Patient Instructions  Prednisone 20mg  daily for 4 days, take with food  Mucinex DM Twice daily  As needed  Cough/congestion  Decrease Salt intake .  Take Lasix 20mg  daily for 3 days then As needed  For  swelling .  Take Flovent 1 puff Twice daily  For 1 week.  Continue on Symbicort 2 puffs Twice daily  .  Chest xray today .  Follow up with PCP for Anemia .  Follow up with  Dr. [name]  Or  NP 2 weeks and As needed  Please contact office for sooner follow up if symptoms do not improve or worsen or seek emergency care          [name], NP 01/18/2018

## 2018-01-18 NOTE — Assessment & Plan Note (Signed)
Flare with recent URI - will treat with short course of steroids and pulmonary hygiene  Hold on abx for now, check chest xray today .  Plan  Patient Instructions  Prednisone 20mg  daily for 4 days, take with food  Mucinex DM Twice daily  As needed  Cough/congestion  Decrease Salt intake .  Take Lasix 20mg  daily for 3 days then As needed  For swelling .  Take Flovent 1 puff Twice daily  For 1 week.  Continue on Symbicort 2 puffs Twice daily  .  Chest xray today .  Follow up with PCP for Anemia .  Follow up with  Dr. [name]  Or [name] 2 weeks and As needed  Please contact office for sooner follow up if symptoms do not improve or worsen or seek emergency care

## 2018-01-18 NOTE — Assessment & Plan Note (Signed)
Very mild flare, does not appear to be in overt fluid overload  Diuresis gently   Plan  Patient Instructions  Prednisone 20mg  daily for 4 days, take with food  Mucinex DM Twice daily  As needed  Cough/congestion  Decrease Salt intake .  Take Lasix 20mg  daily for 3 days then As needed  For swelling .  Take Flovent 1 puff Twice daily  For 1 week.  Continue on Symbicort 2 puffs Twice daily  .  Chest xray today .  Follow up with PCP for Anemia .  Follow up with  Dr. [name]  Or Kenzie Flakes NP 2 weeks and As needed  Please contact office for sooner follow up if symptoms do not improve or worsen or seek emergency care

## 2018-01-20 ENCOUNTER — Other Ambulatory Visit: Payer: Self-pay | Admitting: Family Medicine

## 2018-01-25 ENCOUNTER — Ambulatory Visit: Payer: Medicare Other | Admitting: Adult Health

## 2018-01-25 ENCOUNTER — Ambulatory Visit (HOSPITAL_BASED_OUTPATIENT_CLINIC_OR_DEPARTMENT_OTHER)
Admission: RE | Admit: 2018-01-25 | Discharge: 2018-01-25 | Disposition: A | Discharge status: Home / Self Care | Payer: Medicare Other | Source: Ambulatory Visit | Attending: Internal Medicine | Admitting: Internal Medicine

## 2018-01-25 ENCOUNTER — Ambulatory Visit (INDEPENDENT_AMBULATORY_CARE_PROVIDER_SITE_OTHER): Payer: Medicare Other | Admitting: Internal Medicine

## 2018-01-25 ENCOUNTER — Encounter: Payer: Self-pay | Admitting: Internal Medicine

## 2018-01-25 VITALS — BP 136/62 | HR 86 | Temp 97.8°F | Resp 16 | Ht 63.0 in | Wt 170.5 lb

## 2018-01-25 DIAGNOSIS — R0609 Other forms of dyspnea: Secondary | ICD-10-CM

## 2018-01-25 DIAGNOSIS — R1013 Epigastric pain: Secondary | ICD-10-CM | POA: Diagnosis not present

## 2018-01-25 DIAGNOSIS — R0602 Shortness of breath: Secondary | ICD-10-CM | POA: Diagnosis not present

## 2018-01-25 LAB — HEMOCCULT GUIAC POC 1CARD (OFFICE): Fecal Occult Blood, POC: NEGATIVE

## 2018-01-25 NOTE — Progress Notes (Signed)
Pre visit review using our clinic review tool, if applicable. No additional management support is needed unless otherwise documented below in the visit note. 

## 2018-01-25 NOTE — Progress Notes (Signed)
Subjective:    Patient ID: Crystal Kerr, female    DOB: 1924-12-26, 85 y.o.   MRN: [id number]  DOS:  01/25/2018 Type of visit - description: acute, here with [name], chief complaint is fatigue.  Chart reviewed:  --admitted to the hospital 12/24/2017, status post transurethral resection of bladder tumor.  Intravesicular instillation of chemotherapy --Seen 01/18/2018 with a asthma flareup at the pulmonary clinic, at that time symptoms were mostly nasal congestion, Rx steroids,Chest x-ray nonacute.  Overall feels better.  The reason she is here is because: On 01/22/2018 she felt constipated, took a laxative and shortly after he had a very large BM. During the process she felt that she almost passed out. Denies any rectal pain, blood in the stools or diarrhea. No abdominal pain, nausea or vomiting.  Since then she feels exhausted, reports that she usually is able to move around her home without mild difficulty breathing but lately her DOE is worse, when she moves she sweats and gets exhausted.    Review of Systems Denies fever chills Appetite somewhat decreased No chest pain Has edema on and off, taking diuretics on and off No recent palpitations Cough at baseline No gross hematuria recently.  Past Medical History:  Diagnosis Date  . Arthritis   . Asthma   . Atrial fibrillation ([place])    a. s/p DCCV in 2015, on Amiodarone and Eliquis  . CHF (congestive heart failure) ([place])   . COPD (chronic obstructive pulmonary disease) ([place])   . Dysrhythmia    afib -hx   . Neuromuscular disorder ([place])    peripheral neuropathy   . Pneumonia    hx of x 2   . PONV (postoperative nausea and vomiting)   . PVC's (premature ventricular contractions)     Past Surgical History:  Procedure Laterality Date  . APPENDECTOMY    . CARDIOVERSION N/A 03/08/2013   Procedure: CARDIOVERSION;  Surgeon: Larey Dresser, MD;  Location: [facility];  Service: Cardiovascular;  Laterality: N/A;  .  CHOLECYSTECTOMY    . oophrectomy    . TEE WITHOUT CARDIOVERSION N/A 03/08/2013   Procedure: TRANSESOPHAGEAL ECHOCARDIOGRAM (TEE);  Surgeon: Larey Dresser, MD;  Location: [facility];  Service: Cardiovascular;  Laterality: N/A;  . TONSILLECTOMY    . TRANSURETHRAL RESECTION OF BLADDER TUMOR WITH GYRUS (TURBT-GYRUS)  05/13/2016   urology wiht [facility]  . TRANSURETHRAL RESECTION OF BLADDER TUMOR WITH MITOMYCIN-C N/A 12/24/2017   Procedure: TRANSURETHRAL RESECTION OF BLADDER TUMOR;  Surgeon: Lucas Mallow, MD;  Location: [facility];  Service: Urology;  Laterality: N/A;    Social History   Socioeconomic History  . Marital status: Divorced    Spouse name: Not on file  . Number of children: Not on file  . Years of education: Not on file  . Highest education level: Not on file  Occupational History  . Occupation: Retired    Fish farm manager: RETIRED  Social Needs  . Financial resource strain: Not on file  . Food insecurity:    Worry: Not on file    Inability: Not on file  . Transportation needs:    Medical: Not on file    Non-medical: Not on file  Tobacco Use  . Smoking status: Former Smoker    Packs/day: 1.00    Years: 20.00    Pack years: 20.00    Types: Cigarettes    Last attempt to quit: 04/13/1981    Years since quitting: 36.8  . Smokeless tobacco: Never Used  Substance and Sexual  Activity  . Alcohol use: Yes    Comment: Occasional  . Drug use: No  . Sexual activity: Never  Lifestyle  . Physical activity:    Days per week: Not on file    Minutes per session: Not on file  . Stress: Not on file  Relationships  . Social connections:    Talks on phone: Not on file    Gets together: Not on file    Attends religious service: Not on file    Active member of club or organization: Not on file    Attends meetings of clubs or organizations: Not on file    Relationship status: Not on file  . Intimate partner violence:    Fear of current or ex partner: Not on file    Emotionally abused:  Not on file    Physically abused: Not on file    Forced sexual activity: Not on file  Other Topics Concern  . Not on file  Social History Narrative  . Not on file      Allergies as of 01/25/2018   No Known Allergies     Medication List       Accurate as of January 25, 2018  4:25 PM. Always use your most recent med list.        acetaminophen 325 MG tablet Commonly known as:  TYLENOL Take 2 tablets (650 mg total) by mouth every 6 (six) hours as needed for mild pain or headache.   albuterol 108 (90 Base) MCG/ACT inhaler Commonly known as:  PROAIR HFA Inhale 2 puffs into the lungs every 4 (four) hours as needed for wheezing or shortness of breath.   budesonide-formoterol 160-4.5 MCG/ACT inhaler Commonly known as:  SYMBICORT Inhale 2 puffs into the lungs 2 (two) times daily.   diltiazem 120 MG 24 hr capsule Commonly known as:  TIAZAC TAKE 1 CAPSULE BY MOUTH DAILY   ELIQUIS 5 MG Tabs tablet Generic drug:  apixaban TAKE 1 TABLET(5 MG) BY MOUTH TWICE DAILY   famotidine 20 MG tablet Commonly known as:  PEPCID Take 1 tablet (20 mg total) by mouth at bedtime.   fluticasone 110 MCG/ACT inhaler Commonly known as:  FLOVENT HFA Inhale 2 puffs into the lungs 2 (two) times daily.   fluticasone 50 MCG/ACT nasal spray Commonly known as:  FLONASE Place 1 spray into both nostrils daily.   furosemide 20 MG tablet Commonly known as:  LASIX Take 1 tablet (20 mg total) by mouth daily. Use as needed for swelling   HYDROcodone-acetaminophen 5-325 MG tablet Commonly known as:  NORCO/VICODIN Take 1 tablet by mouth every 4 (four) hours as needed for moderate pain.   mirabegron ER 25 MG Tb24 tablet Commonly known as:  MYRBETRIQ Take 1 tablet (25 mg total) by mouth daily. Use for bladder symptoms   montelukast 10 MG tablet Commonly known as:  SINGULAIR TAKE 1 TABLET BY MOUTH DAILY   multivitamin with minerals tablet Take 1 tablet by mouth daily.   pantoprazole 40 MG  tablet Commonly known as:  PROTONIX Take 1 tablet (40 mg total) by mouth daily before breakfast.   predniSONE 10 MG tablet Commonly known as:  DELTASONE 2 tabs daily for 4 days .   traMADol 50 MG tablet Commonly known as:  ULTRAM TAKE 1/2 TO 1 TABLET BY MOUTH EVERY 8 HOURS AS NEEDED FOR HIP PAIN   triamcinolone cream 0.1 % Commonly known as:  KENALOG Apply 1 application topically 2 (two) times daily. Apply to rash  on side as needed           Objective:   Physical Exam BP 136/62 (BP Location: Left Arm, Patient Position: Sitting, Cuff Size: Normal)   Pulse 86   Temp 97.8 F (36.6 C) (Oral)   Resp 16   Ht 5\' 3"  (1.6 m)   Wt 170 lb 8 oz (77.3 kg)   SpO2 96%   BMI 30.20 kg/m  General:   Well developed, NAD, BMI noted.  HEENT:  Normocephalic . Face symmetric, atraumatic Neck: No JVD Lungs:  Decreased breath sounds Normal respiratory effort, no intercostal retractions, no accessory muscle use. Heart: RRR,  no murmur.  no pretibial edema bilaterally  Abdomen:  Not distended, soft, mild to moderately tender at the epigastric area without mass or rebound.  No RUQ discomfort even to deep palpation. DRE: Brown stools, no mucus or blood.  Hemoccult negative Skin: Not pale. Not jaundice Neurologic:  alert & oriented X3.  Speech normal, gait limited, needed help to transfer to the table, got a slightly DOE while moving. Psych--  Cognition and judgment appear intact.  Cooperative with normal attention span and concentration.  Behavior appropriate. No anxious or depressed appearing.     Assessment      61 -year-old ladyith history of bladder cancer, asthma, interstitial lung disease, atrial fibrillation-anticoagulated, CHFpresents with the following.  Fatigue, increased DOE: Symptoms starte 01/22/2018 after she had a large BM, unclear if there is any relationship. She does note same toxic, vital signs are stable, O2 sat resting 96%, not pale. She does have epigastric  pain, she is anticoagulated but Hemoccult negative. Unclear why chronic DOE has been getting worse. EKG: A. fib, rate 75.  Previous EKG is sinus rhythm. Plan: Will discuss EKG with cardiology Chest x-ray  CMP, CBC Further advised with results, ER if symptoms increase.

## 2018-01-26 ENCOUNTER — Telehealth: Payer: Self-pay | Admitting: Cardiology

## 2018-01-26 ENCOUNTER — Telehealth: Payer: Self-pay | Admitting: Internal Medicine

## 2018-01-26 LAB — CBC WITH DIFFERENTIAL/PLATELET
Basophils Absolute: 0.1 10*3/uL (ref 0.0–0.1)
Basophils Relative: 1 % (ref 0.0–3.0)
Eosinophils Absolute: 0.2 10*3/uL (ref 0.0–0.7)
Eosinophils Relative: 1.7 % (ref 0.0–5.0)
HCT: 34.2 % — ABNORMAL LOW (ref 36.0–46.0)
Hemoglobin: 10.6 g/dL — ABNORMAL LOW (ref 12.0–15.0)
Lymphocytes Relative: 28.2 % (ref 12.0–46.0)
Lymphs Abs: 3.6 10*3/uL (ref 0.7–4.0)
MCHC: 31 g/dL (ref 30.0–36.0)
MCV: 73.5 fl — ABNORMAL LOW (ref 78.0–100.0)
MONO ABS: 1.7 10*3/uL — AB (ref 0.1–1.0)
Monocytes Relative: 13.2 % — ABNORMAL HIGH (ref 3.0–12.0)
Neutro Abs: 7.2 10*3/uL (ref 1.4–7.7)
Neutrophils Relative %: 55.9 % (ref 43.0–77.0)
PLATELETS: 317 10*3/uL (ref 150.0–400.0)
RBC: 4.65 Mil/uL (ref 3.87–5.11)
RDW: 18.6 % — ABNORMAL HIGH (ref 11.5–15.5)
WBC: 12.9 10*3/uL — ABNORMAL HIGH (ref 4.0–10.5)

## 2018-01-26 LAB — COMPREHENSIVE METABOLIC PANEL
ALT: 14 U/L (ref 0–35)
AST: 21 U/L (ref 0–37)
Albumin: 3.6 g/dL (ref 3.5–5.2)
Alkaline Phosphatase: 91 U/L (ref 39–117)
BILIRUBIN TOTAL: 0.3 mg/dL (ref 0.2–1.2)
BUN: 31 mg/dL — AB (ref 6–23)
CO2: 26 mEq/L (ref 19–32)
CREATININE: 1.12 mg/dL (ref 0.40–1.20)
Calcium: 9.3 mg/dL (ref 8.4–10.5)
Chloride: 104 mEq/L (ref 96–112)
GFR: 48.44 mL/min — ABNORMAL LOW (ref >60.00)
Glucose, Bld: 72 mg/dL (ref 70–99)
Potassium: 4.7 mEq/L (ref 3.5–5.1)
SODIUM: 138 meq/L (ref 135–145)
Total Protein: 6.2 g/dL (ref 6.0–8.3)

## 2018-01-26 NOTE — Telephone Encounter (Signed)
New Message   Patient c/o Palpitations:  High priority if patient c/o lightheadedness, shortness of breath, or chest pain  1) How long have you had palpitations/irregular HR/ Afib? Are you having the symptoms now? Afib since Saturday   2) Are you currently experiencing lightheadedness, SOB or CP? SOB and sweating   3) Do you have a history of afib (atrial fibrillation) or irregular heart rhythm? Yes   4) Have you checked your BP or HR? (document readings if available): BP and HR taken yesterday at doctors office and EKG done also.  5) Are you experiencing any other symptoms? no

## 2018-01-26 NOTE — Telephone Encounter (Signed)
Spoke with pt's daughter who states since sat her mother has been experiencing SOB with exertion, sweating and increase tiredness. She report Sat she was so weak she almost passed out.  She was seen by her pcp yesterday and EKG reveled she is back in Afib HR 75. Daughter encouraged to take pt to ED for further evaluations since her mother is very symptomatic. Daughter states he mother is not going to go and would rather her be seen in office. Dr. Percival Spanish (DOD) updated and reviewed EKG who recommend pt be worked into Dr. Jacalyn Lefevre schedule for tomorrow as there are no APP appointments open for tomorrow or Friday. Daughter informed of recommendation and advised that there is no guarantee that pt will be able to be seen tomorrow and encouraged again to take pt to ER. Daughter states they will await phone call from Nurse tomorrow and if symptoms get worse they will take her.

## 2018-01-26 NOTE — Telephone Encounter (Signed)
LMOM: Chest x-ray and labs look okay.  I asked for a call back, like to see how she is doing regards to increased DOE

## 2018-01-27 ENCOUNTER — Ambulatory Visit: Payer: Medicare Other | Admitting: Cardiology

## 2018-01-27 ENCOUNTER — Emergency Department (HOSPITAL_BASED_OUTPATIENT_CLINIC_OR_DEPARTMENT_OTHER)
Admission: EM | Admit: 2018-01-27 | Discharge: 2018-01-27 | Disposition: A | Discharge status: Home / Self Care | Payer: Medicare Other | Attending: Emergency Medicine | Admitting: Emergency Medicine

## 2018-01-27 ENCOUNTER — Encounter (HOSPITAL_BASED_OUTPATIENT_CLINIC_OR_DEPARTMENT_OTHER): Payer: Self-pay

## 2018-01-27 ENCOUNTER — Other Ambulatory Visit: Payer: Self-pay

## 2018-01-27 ENCOUNTER — Emergency Department (HOSPITAL_BASED_OUTPATIENT_CLINIC_OR_DEPARTMENT_OTHER): Payer: Medicare Other

## 2018-01-27 DIAGNOSIS — I5032 Chronic diastolic (congestive) heart failure: Secondary | ICD-10-CM | POA: Diagnosis not present

## 2018-01-27 DIAGNOSIS — Z79899 Other long term (current) drug therapy: Secondary | ICD-10-CM | POA: Diagnosis not present

## 2018-01-27 DIAGNOSIS — I110 Hypertensive heart disease with heart failure: Secondary | ICD-10-CM | POA: Diagnosis not present

## 2018-01-27 DIAGNOSIS — I480 Paroxysmal atrial fibrillation: Secondary | ICD-10-CM | POA: Insufficient documentation

## 2018-01-27 DIAGNOSIS — Z7901 Long term (current) use of anticoagulants: Secondary | ICD-10-CM | POA: Insufficient documentation

## 2018-01-27 DIAGNOSIS — R0609 Other forms of dyspnea: Secondary | ICD-10-CM | POA: Diagnosis not present

## 2018-01-27 DIAGNOSIS — Z87891 Personal history of nicotine dependence: Secondary | ICD-10-CM | POA: Insufficient documentation

## 2018-01-27 DIAGNOSIS — J449 Chronic obstructive pulmonary disease, unspecified: Secondary | ICD-10-CM | POA: Insufficient documentation

## 2018-01-27 DIAGNOSIS — R0602 Shortness of breath: Secondary | ICD-10-CM | POA: Diagnosis not present

## 2018-01-27 LAB — CBC WITH DIFFERENTIAL/PLATELET
Abs Immature Granulocytes: 0.05 10*3/uL (ref 0.00–0.07)
BASOS ABS: 0.1 10*3/uL (ref 0.0–0.1)
Basophils Relative: 1 %
Eosinophils Absolute: 0.2 10*3/uL (ref 0.0–0.5)
Eosinophils Relative: 2 %
HCT: 32.9 % — ABNORMAL LOW (ref 36.0–46.0)
Hemoglobin: 9.8 g/dL — ABNORMAL LOW (ref 12.0–15.0)
Immature Granulocytes: 1 %
LYMPHS PCT: 20 %
Lymphs Abs: 2.1 10*3/uL (ref 0.7–4.0)
MCH: 23.1 pg — ABNORMAL LOW (ref 26.0–34.0)
MCHC: 29.8 g/dL — ABNORMAL LOW (ref 30.0–36.0)
MCV: 77.6 fL — ABNORMAL LOW (ref 80.0–100.0)
Monocytes Absolute: 1.4 10*3/uL — ABNORMAL HIGH (ref 0.1–1.0)
Monocytes Relative: 13 %
Neutro Abs: 6.9 10*3/uL (ref 1.7–7.7)
Neutrophils Relative %: 63 %
Platelets: 282 10*3/uL (ref 150–400)
RBC: 4.24 MIL/uL (ref 3.87–5.11)
RDW: 19.4 % — ABNORMAL HIGH (ref 11.5–15.5)
WBC: 10.7 10*3/uL — AB (ref 4.0–10.5)
nRBC: 0 % (ref 0.0–0.2)

## 2018-01-27 LAB — COMPREHENSIVE METABOLIC PANEL
ALT: 16 U/L (ref 0–44)
ANION GAP: 5 (ref 5–15)
AST: 23 U/L (ref 15–41)
Albumin: 3.3 g/dL — ABNORMAL LOW (ref 3.5–5.0)
Alkaline Phosphatase: 79 U/L (ref 38–126)
BUN: 30 mg/dL — ABNORMAL HIGH (ref 8–23)
CO2: 25 mmol/L (ref 22–32)
Calcium: 8.8 mg/dL — ABNORMAL LOW (ref 8.9–10.3)
Chloride: 107 mmol/L (ref 98–111)
Creatinine, Ser: 1.64 mg/dL — ABNORMAL HIGH (ref 0.44–1.00)
GFR calc non Af Amer: 27 mL/min — ABNORMAL LOW (ref >60)
GFR, EST AFRICAN AMERICAN: 31 mL/min — AB (ref >60)
Glucose, Bld: 116 mg/dL — ABNORMAL HIGH (ref 70–99)
Potassium: 3.7 mmol/L (ref 3.5–5.1)
Sodium: 137 mmol/L (ref 135–145)
Total Bilirubin: 0.6 mg/dL (ref 0.3–1.2)
Total Protein: 6.3 g/dL — ABNORMAL LOW (ref 6.5–8.1)

## 2018-01-27 LAB — TROPONIN I: Troponin I: <0.03 ng/mL (ref <0.03)

## 2018-01-27 MED ORDER — PROPOFOL 10 MG/ML IV BOLUS
40.0000 mg | Freq: Once | INTRAVENOUS | Status: AC
  Administered 2018-01-27: 40 mg via INTRAVENOUS
  Filled 2018-01-27: qty 20

## 2018-01-27 MED ORDER — SODIUM CHLORIDE 0.9 % IV SOLN
INTRAVENOUS | Status: AC | PRN
  Administered 2018-01-27: 1000 mL via INTRAVENOUS

## 2018-01-27 NOTE — ED Notes (Addendum)
Walked around the unit with assistance.  Pt denies dizziness, tolerates well.  Pt states she is feeling much better.  MD notified.

## 2018-01-27 NOTE — ED Provider Notes (Signed)
  Provider Note MRN:  [id number]  Arrival date & time: 01/27/18    ED Course and Medical Decision Making  I received sign out for this patient at shift change from Dr. Tyrone Nine.  Patient is status post cardioversion for A. fib, was ambulated by nursing staff and did much better.  Upon my evaluation patient is looking very well, feeling well, requesting discharge.  She has close follow-up with her PCP.  After the discussed management above, the patient was determined to be safe for discharge.  The patient was in agreement with this plan and all questions regarding their care were answered.  ED return precautions were discussed and the patient will return to the ED with any significant worsening of condition.  Barth Kirks. Sedonia Small, [place] [email or web address]     Maudie Flakes, MD 01/27/18 [phone]

## 2018-01-27 NOTE — Sedation Documentation (Signed)
Pt cardioverted with 275 joules.  Pt converted to SR

## 2018-01-27 NOTE — Telephone Encounter (Signed)
Spoke with pt dtr, appointment scheduled today at 1:30 pm.

## 2018-01-27 NOTE — Telephone Encounter (Signed)
Will need PA evaluation today or ER evaluation. Kirk Ruths, MD

## 2018-01-27 NOTE — Discharge Instructions (Signed)
You were evaluated in the Emergency Department and after careful evaluation, we did not find any emergent condition requiring admission or further testing in the hospital.  Your symptoms today seem to be due to atrial fibrillation.  We were able to perform a cardioversion procedure today to resolve your atrial fibrillation.  Please follow-up closely with your regular doctors to determine future management.  Please return to the Emergency Department if you experience any worsening of your condition.  We encourage you to follow up with a primary care provider.  Thank you for allowing [place] to be a part of your care.

## 2018-01-27 NOTE — ED Notes (Signed)
Pt became very SHOB, but maintained O2 sats of 98%, HR up to 107.

## 2018-01-27 NOTE — ED Provider Notes (Addendum)
[facility] EMERGENCY DEPARTMENT Provider Note   CSN: [id number] Arrival date & time: 01/27/18  1235     History   Chief Complaint Chief Complaint  Patient presents with  . Fatigue    HPI Crystal Kerr is a 85 y.o. female.  85 yo F with a chief complaint of shortness of breath on exertion.  Going on for the past week.  Every time she gets up to move around she becomes extremely short of breath and has to take a moment to catch her breath.  She states even to get up and go to the bathroom she is able to make it there but then has to rest for at least 5 to 10 minutes until she is able to gather her strength to get back up and walk back.  She was seen by her family doctor 2 days ago and had a chest x-ray that showed no finding from her chronic findings as well as lab work with that was without anemia or significant electrolyte abnormality.  She was noted to be in atrial fibrillation and normally she is in sinus rhythm.  She ended up calling her cardiologist to tried to fit her into the schedule and had her tentatively scheduled for 130 today but they called her and canceled the appointment and so she came to the ED for evaluation.  She has a chronic cough but denies that it is changed significantly.  She denies any change in sputum or increased sputum.  She denies chest pain.  Denies diaphoresis nausea or vomiting.  Denies dark stool or blood in her stool.  She has a small amount of edema that she takes Lasix for every so often.  The history is provided by the patient.  Shortness of Breath  Severity:  Severe Onset quality:  Gradual Duration:  1 week Timing:  Constant Progression:  Worsening Chronicity:  New Context: activity   Relieved by:  Rest Worsened by:  Nothing Ineffective treatments:  None tried Associated symptoms: no chest pain, no fever, no headaches, no vomiting and no wheezing     Past Medical History:  Diagnosis Date  . Arthritis   . Asthma   .  Atrial fibrillation ([place])    a. s/p DCCV in 2015, on Amiodarone and Eliquis  . CHF (congestive heart failure) ([place])   . COPD (chronic obstructive pulmonary disease) ([place])   . Dysrhythmia    afib -hx   . Neuromuscular disorder ([place])    peripheral neuropathy   . Pneumonia    hx of x 2   . PONV (postoperative nausea and vomiting)   . PVC's (premature ventricular contractions)     Patient Active Problem List   Diagnosis Date Noted  . Acute bronchitis 01/18/2018  . Pre-operative clearance 12/22/2017  . Chronic diastolic heart failure ([place]) 11/23/2017  . ILD (interstitial lung disease) ([place]) 11/23/2017  . CAP (community acquired pneumonia) 03/04/2017  . Current use of long term anticoagulation 07/31/2016  . Atypical pneumonia 06/02/2016  . History of atrial fibrillation 06/02/2016  . Lower extremity edema 12/19/2013  . 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 Benign paroxysmal positional vertigo 09/13/2013  . Upper airway cough syndrome 07/10/2013  . Allergic rhinitis 07/05/2013  . Persistent atrial fibrillation 03/30/2013  . Shortness of breath 02/06/2013  . Acute on chronic diastolic congestive heart failure ([place]) 02/04/2013  . Arthritis of spine 07/01/2012  . Intrinsic asthma 07/01/2012  .  Dyspnea 04/12/2012  . PVC's (premature ventricular contractions) 04/24/2011  . Murmur, cardiac 04/24/2011    Past Surgical History:  Procedure Laterality Date  . APPENDECTOMY    . CARDIOVERSION N/A 03/08/2013   Procedure: CARDIOVERSION;  Surgeon: Larey Dresser, MD;  Location: [facility];  Service: Cardiovascular;  Laterality: N/A;  . CHOLECYSTECTOMY    . oophrectomy    . TEE WITHOUT CARDIOVERSION N/A 03/08/2013   Procedure: TRANSESOPHAGEAL ECHOCARDIOGRAM (TEE);  Surgeon: Larey Dresser, MD;  Location: [facility];  Service: Cardiovascular;  Laterality: N/A;  . TONSILLECTOMY    . TRANSURETHRAL RESECTION OF BLADDER TUMOR  WITH GYRUS (TURBT-GYRUS)  05/13/2016   urology wiht [facility]  . TRANSURETHRAL RESECTION OF BLADDER TUMOR WITH MITOMYCIN-C N/A 12/24/2017   Procedure: TRANSURETHRAL RESECTION OF BLADDER TUMOR;  Surgeon: Lucas Mallow, MD;  Location: [facility];  Service: Urology;  Laterality: N/A;     OB History   No obstetric history on file.      Home Medications    Prior to Admission medications   Medication Sig Start Date End Date Taking? Authorizing Provider  acetaminophen (TYLENOL) 325 MG tablet Take 2 tablets (650 mg total) by mouth every 6 (six) hours as needed for mild pain or headache. 06/05/16   Raiford Noble Latif, DO  albuterol [facility] HFA) 108 (90 Base) MCG/ACT inhaler Inhale 2 puffs into the lungs every 4 (four) hours as needed for wheezing or shortness of breath. 08/18/17   Shelda Pal, DO  budesonide-formoterol (SYMBICORT) 160-4.5 MCG/ACT inhaler Inhale 2 puffs into the lungs 2 (two) times daily. 12/29/17   Copland, Gay Filler, MD  diltiazem (TIAZAC) 120 MG 24 hr capsule TAKE 1 CAPSULE BY MOUTH DAILY Patient taking differently: Take 120 mg by mouth daily.  05/13/17   Crenshaw, Denice Bors, MD  ELIQUIS 5 MG TABS tablet TAKE 1 TABLET(5 MG) BY MOUTH TWICE DAILY Patient taking differently: Take 5 mg by mouth daily.  08/30/17   Lelon Perla, MD  famotidine (PEPCID) 20 MG tablet Take 1 tablet (20 mg total) by mouth at bedtime. 01/21/18   Copland, Gay Filler, MD  fluticasone (FLONASE) 50 MCG/ACT nasal spray Place 1 spray into both nostrils daily. 04/22/17   Chesley Mires, MD  fluticasone (FLOVENT HFA) 110 MCG/ACT inhaler Inhale 2 puffs into the lungs 2 (two) times daily.    [provider]  furosemide (LASIX) 20 MG tablet Take 1 tablet (20 mg total) by mouth daily. Use as needed for swelling 08/16/17   Copland, Gay Filler, MD  mirabegron ER (MYRBETRIQ) 25 MG TB24 tablet Take 1 tablet (25 mg total) by mouth daily. Use for bladder symptoms 07/07/17   Copland, Gay Filler, MD  montelukast  (SINGULAIR) 10 MG tablet TAKE 1 TABLET BY MOUTH DAILY 01/03/18   Copland, Gay Filler, MD  Multiple Vitamins-Minerals (MULTIVITAMIN WITH MINERALS) tablet Take 1 tablet by mouth daily.    [provider]  pantoprazole (PROTONIX) 40 MG tablet Take 1 tablet (40 mg total) by mouth daily before breakfast. 09/18/16   Copland, Gay Filler, MD  traMADol (ULTRAM) 50 MG tablet TAKE 1/2 TO 1 TABLET BY MOUTH EVERY 8 HOURS AS NEEDED FOR HIP PAIN Patient taking differently: Take 50 mg by mouth every 8 (eight) hours as needed for moderate pain. TAKE 1/2 TO 1 TABLET BY MOUTH EVERY 8 HOURS AS NEEDED FOR HIP PAIN 10/22/17   Copland, Gay Filler, MD  triamcinolone cream (KENALOG) 0.1 % Apply 1 application topically 2 (two) times daily. Apply  to rash on side as needed Patient not taking: Reported on 01/25/2018 12/25/15   Copland, Gay Filler, MD    Family History Family History  Problem Relation Age of Onset  . Heart failure Mother   . Diabetes Father   . Heart disease Other        No family history    Social History Social History   Tobacco Use  . Smoking status: Former Smoker    Packs/day: 1.00    Years: 20.00    Pack years: 20.00    Types: Cigarettes    Last attempt to quit: 04/13/1981    Years since quitting: 36.8  . Smokeless tobacco: Never Used  Substance Use Topics  . Alcohol use: Yes    Comment: Occasional  . Drug use: No     Allergies   Patient has no known allergies.   Review of Systems Review of Systems  Constitutional: Negative for chills and fever.  HENT: Negative for congestion and rhinorrhea.   Eyes: Negative for redness and visual disturbance.  Respiratory: Positive for shortness of breath. Negative for wheezing.   Cardiovascular: Negative for chest pain and palpitations.  Gastrointestinal: Negative for nausea and vomiting.  Genitourinary: Negative for dysuria and urgency.  Musculoskeletal: Negative for arthralgias and myalgias.  Skin: Negative for pallor and wound.    Neurological: Negative for dizziness and headaches.     Physical Exam Updated Vital Signs BP (!) 140/48   Pulse 80   Temp 98.2 F (36.8 C)   Resp 18   SpO2 97%   Physical Exam Vitals signs and nursing note reviewed.  Constitutional:      General: She is not in acute distress.    Appearance: She is well-developed. She is not diaphoretic.  HENT:     Head: Normocephalic and atraumatic.  Eyes:     Pupils: Pupils are equal, round, and reactive to light.  Neck:     Musculoskeletal: Normal range of motion and neck supple.  Cardiovascular:     Rate and Rhythm: Normal rate. Rhythm irregular.     Heart sounds: No murmur. No friction rub. No gallop.   Pulmonary:     Effort: Pulmonary effort is normal.     Breath sounds: No wheezing or rales.  Abdominal:     General: There is distension (mild, chronic).     Palpations: Abdomen is soft.     Tenderness: There is no abdominal tenderness.  Musculoskeletal:        General: No tenderness.     Comments: Trace edema to the bilateral lower extremities  Skin:    General: Skin is warm and dry.  Neurological:     Mental Status: She is alert and oriented to person, place, and time.  Psychiatric:        Behavior: Behavior normal.      ED Treatments / Results  Labs (all labs ordered are listed, but only abnormal results are displayed) Labs Reviewed  CBC WITH DIFFERENTIAL/PLATELET - Abnormal; Notable for the following components:      Result Value   WBC 10.7 (*)    Hemoglobin 9.8 (*)    HCT 32.9 (*)    MCV 77.6 (*)    MCH 23.1 (*)    MCHC 29.8 (*)    RDW 19.4 (*)    Monocytes Absolute 1.4 (*)    All other components within normal limits  COMPREHENSIVE METABOLIC PANEL - Abnormal; Notable for the following components:   Glucose, Bld 116 (*)  BUN 30 (*)    Creatinine, Ser 1.64 (*)    Calcium 8.8 (*)    Total Protein 6.3 (*)    Albumin 3.3 (*)    GFR calc non Af Amer 27 (*)    GFR calc Af Amer 31 (*)    All other components  within normal limits  TROPONIN I    EKG EKG Interpretation  Date/Time:  Thursday January 27 2018 15:28:47 EST Ventricular Rate:  62 PR Interval:    QRS Duration: 82 QT Interval:  431 QTC Calculation: 438 R Axis:   19 Text Interpretation:  Sinus rhythm replaced a fib Atrial premature complex Borderline prolonged PR interval Low voltage, precordial leads Otherwise no significant change Confirmed by Deno Etienne [phone]) on 01/27/2018 3:46:25 PM   Radiology Dg Chest 2 View  Result Date: 01/27/2018 CLINICAL DATA:  Fatigue and shortness of breath since 01/22/2017. EXAM: CHEST - 2 VIEW COMPARISON:  PA and lateral chest 01/25/2018 and 01/18/2018. CT chest 03/04/2017. FINDINGS: Prominence of the pulmonary interstitium appears unchanged. No consolidative process, pneumothorax or effusion. Heart size is normal. Aortic atherosclerosis is noted. No acute or focal bony abnormality. IMPRESSION: No acute disease. No change in prominence of the pulmonary interstitium Atherosclerosis. Electronically Signed   By: Inge Rise M.D.   On: 01/27/2018 13:25    Procedures .Sedation Date/Time: 01/27/2018 3:37 PM Performed by: Deno Etienne, DO Authorized by: Deno Etienne, DO   Consent:    Consent obtained:  Written   Consent given by:  Patient and healthcare agent   Risks discussed:  Allergic reaction, dysrhythmia, inadequate sedation, nausea, vomiting, respiratory compromise necessitating ventilatory assistance and intubation, prolonged sedation necessitating reversal and prolonged hypoxia resulting in organ damage   Alternatives discussed:  Analgesia without sedation and anxiolysis Universal protocol:    Immediately prior to procedure a time out was called: yes     Patient identity confirmation method:  Verbally with patient Indications:    Procedure performed:  Cardioversion Pre-sedation assessment:    Time since last food or drink:  6   ASA classification: class 2 - patient with mild systemic disease      Neck mobility: reduced     Mouth opening:  2 finger widths   Thyromental distance:  3 finger widths   Mallampati score:  III - soft palate, base of uvula visible   Pre-sedation assessments completed and reviewed: airway patency, cardiovascular function, hydration status, mental status, nausea/vomiting, pain level, respiratory function and temperature   Immediate pre-procedure details:    Reviewed: vital signs     Verified: bag valve mask available, emergency equipment available, intubation equipment available, IV patency confirmed, oxygen available, reversal medications available and suction available   Procedure details (see MAR for exact dosages):    Preoxygenation:  Nasal cannula   Sedation:  Propofol   Intra-procedure monitoring:  Continuous capnometry, continuous pulse oximetry, blood pressure monitoring, cardiac monitor, frequent LOC assessments and frequent vital sign checks   Intra-procedure events: respiratory depression     Intra-procedure management:  Airway repositioning and supplemental oxygen (jaw thrust)   Total Provider sedation time (minutes):  30 Post-procedure details:    Post-sedation assessments completed and reviewed: airway patency, cardiovascular function, hydration status, mental status, nausea/vomiting, pain level, respiratory function and temperature     Patient is stable for discharge or admission: yes     Patient tolerance:  Tolerated well, no immediate complications .Cardioversion Date/Time: 01/27/2018 3:40 PM Performed by: Deno Etienne, DO Authorized by: Deno Etienne, DO  Consent:    Consent obtained:  Written   Consent given by:  Patient and parent   Risks discussed:  Cutaneous burn, death, induced arrhythmia and pain   Alternatives discussed:  No treatment Pre-procedure details:    Cardioversion basis:  Elective   Rhythm:  Atrial fibrillation   Electrode placement:  Anterior-posterior Patient sedated: Yes. Refer to sedation procedure documentation for  details of sedation.  Attempt one:    Cardioversion mode:  Synchronous   Waveform:  Biphasic   Shock (Joules):  200   Shock outcome:  Conversion to normal sinus rhythm Post-procedure details:    Patient status:  Alert   Patient tolerance of procedure:  Tolerated well, no immediate complications   (including critical care time)  Medications Ordered in ED Medications  propofol (DIPRIVAN) 10 mg/mL bolus/IV push 40 mg (40 mg Intravenous Given 01/27/18 1528)  0.9 %  sodium chloride infusion ( Intravenous Stopped 01/27/18 1602)     Initial Impression / Assessment and Plan / ED Course  I have reviewed the triage vital signs and the nursing notes.  Pertinent labs & imaging results that were available during my care of the patient were reviewed by me and considered in my medical decision making (see chart for details).     85 yo F with a chief complaint of shortness of breath on exertion.  Going on for the past week.  Patient is well-appearing and nontoxic at rest.  Will obtain a laboratory evaluation including a troponin.  The patient is in atrial fibrillation on her EKG.  She is currently rate controlled.  She is clear lung sounds on my exam.  Spoke with Dr. Sallyanne Kuster on the phone, he felt that the patient symptoms could possibly be explained by her A. fib.  He did recommend attempting cardioversion at this time.  Cardioverted with significant improvement, will give the patient some time to recover from sedation and attempt to ambulate.   CRITICAL CARE Performed by: Cecilio Asper   Total critical care time: 35 minutes  Critical care time was exclusive of separately billable procedures and treating other patients.  Critical care was necessary to treat or prevent imminent or life-threatening deterioration.  Critical care was time spent personally by me on the following activities: development of treatment plan with patient and/or surrogate as well as nursing, discussions with  consultants, evaluation of patient's response to treatment, examination of patient, obtaining history from patient or surrogate, ordering and performing treatments and interventions, ordering and review of laboratory studies, ordering and review of radiographic studies, pulse oximetry and re-evaluation of patient's condition.   Discussed with Dr. Sedonia Small, please see his note for further details of care.       CHA2DS2/VAS Stroke Risk Points  Current as of yesterday     5 >= 2 Points: High Risk  1 - 1.99 Points: Medium Risk  0 Points: Low Risk    This is the only CHA2DS2/VAS Stroke Risk Points available for the past  year.:  Last Change: N/A     Details    This score determines the patient's risk of having a stroke if the  patient has atrial fibrillation.       Points Metrics  1 Has Congestive Heart Failure:  Yes    Current as of yesterday  0 Has Vascular Disease:  No    Current as of yesterday  1 Has Hypertension:  Yes    Current as of yesterday  2 Age:  33  Current as of yesterday  0 Has Diabetes:  No    Current as of yesterday  0 Had Stroke:  No  Had TIA:  No  Had thromboembolism:  No    Current as of yesterday  1 Female:  Yes    Current as of yesterday          The patients results and plan were reviewed and discussed.   Any x-rays performed were independently reviewed by myself.   Differential diagnosis were considered with the presenting HPI.  Medications  propofol (DIPRIVAN) 10 mg/mL bolus/IV push 40 mg (40 mg Intravenous Given 01/27/18 1528)  0.9 %  sodium chloride infusion ( Intravenous Stopped 01/27/18 1602)    Vitals:   01/27/18 1601 01/27/18 1616 01/27/18 1617 01/27/18 1706  BP: 128/90 135/61  (!) 140/48  Pulse: 67 68 76 80  Resp: 14 20 (!) 26 18  Temp:    98.2 F (36.8 C)  TempSrc:      SpO2: 100% 100% 97% 97%    Final diagnoses:  Dyspnea on exertion  Paroxysmal atrial fibrillation (HCC)    Admission/ observation were discussed with the  admitting physician, patient and/or family and they are comfortable with the plan.    Final Clinical Impressions(s) / ED Diagnoses   Final diagnoses:  Dyspnea on exertion  Paroxysmal atrial fibrillation [facility])    ED Discharge Orders    None       Deno Etienne, DO 01/28/18 [place], [place], DO 02/08/18 [phone]

## 2018-01-27 NOTE — ED Notes (Signed)
Patient transported to X-ray 

## 2018-01-27 NOTE — ED Triage Notes (Signed)
Pt c/o fatigue, SOB x 5 days-seen by PC this week-dx Afib-hx of same in the past-to triage in w/c-NAD

## 2018-01-27 NOTE — ED Notes (Signed)
Pt on monitor 

## 2018-01-28 ENCOUNTER — Telehealth: Payer: Self-pay | Admitting: Cardiology

## 2018-01-28 NOTE — Telephone Encounter (Signed)
Daughter updated with Dr. Jacalyn Lefevre recommendation. Daughter verbalized understanding and confirmed this is a noninvasive procedure to where pt will have to hold her anticoagulation.

## 2018-01-28 NOTE — Telephone Encounter (Signed)
Okay if patient can stay on anticoagulation.  Otherwise will need to be delayed. Kirk Ruths, MD

## 2018-01-28 NOTE — Telephone Encounter (Signed)
Spoke with the patient's daughter, she went to the ER and reportedly got cardioverted back in sinus rhythm and she feels much better, almost back to normal.  Recommend to follow-up with cardiology and PCP ASAP.

## 2018-01-28 NOTE — Telephone Encounter (Signed)
Spoke with pt who was inquiring if [name] felt like it was ok for her mother to proceed with having BCG procedure preformed, due to pt just having a cardioversion. She states the were told the risk are flu like symptoms as well as sepsis.  Will route to MD.

## 2018-01-28 NOTE — Telephone Encounter (Signed)
New Message:    Patient daughter calling concerning her mother is getting ready to have some treatment, and wanted to make sure that he mother is able to take these treatment. Please call daughter.

## 2018-02-01 ENCOUNTER — Encounter: Payer: Self-pay | Admitting: Adult Health

## 2018-02-01 ENCOUNTER — Ambulatory Visit (INDEPENDENT_AMBULATORY_CARE_PROVIDER_SITE_OTHER): Payer: Medicare Other | Admitting: Adult Health

## 2018-02-01 DIAGNOSIS — I4819 Other persistent atrial fibrillation: Secondary | ICD-10-CM | POA: Diagnosis not present

## 2018-02-01 DIAGNOSIS — J45909 Unspecified asthma, uncomplicated: Secondary | ICD-10-CM

## 2018-02-01 DIAGNOSIS — I5032 Chronic diastolic (congestive) heart failure: Secondary | ICD-10-CM

## 2018-02-01 DIAGNOSIS — J849 Interstitial pulmonary disease, unspecified: Secondary | ICD-10-CM | POA: Diagnosis not present

## 2018-02-01 DIAGNOSIS — C678 Malignant neoplasm of overlapping sites of bladder: Secondary | ICD-10-CM | POA: Diagnosis not present

## 2018-02-01 DIAGNOSIS — Z5111 Encounter for antineoplastic chemotherapy: Secondary | ICD-10-CM | POA: Diagnosis not present

## 2018-02-01 NOTE — Assessment & Plan Note (Signed)
Appears stable w/out overt volume overload   Plan  Patient Instructions  Low salt diet .  Lasix 20mg  daily As needed  For swelling  Continue on Symbicort 2 puffs Twice daily  . Rinse after use.  High fiber diet.  Stool softner daily As needed  Constipation  Follow up with  Dr. [name]  Or [name] 3 months and As needed   Please contact office for sooner follow up if symptoms do not improve or worsen or seek emergency care

## 2018-02-01 NOTE — Patient Instructions (Signed)
Low salt diet .  Lasix 20mg  daily As needed  For swelling  Continue on Symbicort 2 puffs Twice daily  . Rinse after use.  High fiber diet.  Stool softner daily As needed  Constipation  Follow up with  Dr. [name]  Or Rabecka Brendel NP 3 months and As needed   Please contact office for sooner follow up if symptoms do not improve or worsen or seek emergency care

## 2018-02-01 NOTE — Assessment & Plan Note (Signed)
Recent flare with URI , now resolved   Plan  Patient Instructions  Low salt diet .  Lasix 20mg  daily As needed  For swelling  Continue on Symbicort 2 puffs Twice daily  . Rinse after use.  High fiber diet.  Stool softner daily As needed  Constipation  Follow up with  Dr. [name]  Or [name] 3 months and As needed   Please contact office for sooner follow up if symptoms do not improve or worsen or seek emergency care

## 2018-02-01 NOTE — Progress Notes (Signed)
Reviewed and agree with assessment/plan.   Abhiraj Dozal, MD [place] [facility] 01/08/2016, 12:24 PM Pager:  [phone]  

## 2018-02-01 NOTE — Progress Notes (Signed)
@Patient  ID: Crystal Kerr, female    DOB: 1924/08/15, 85 y.o.   MRN: [id number]  Chief Complaint  Patient presents with  . Follow-up    Asthma     Referring provider: [name], MD  HPI: 85 year old femaleformerseen in May 2018 after a pneumonia complicated by rhinovirus.  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   TEST/EVENTS : Echo 2015 EF 65%, Gr 2 DD , Mod Dilated RA  PFTs 03/20/14 FEV1 1.28 (85%) ratio 75 s resp to saba and dlco 68 corrects to 103  CT chest Jun 02, 2016 showed groundglass opacities and clustered nodules compatible with an acute pneumonia. There is suggestion of cavitation with a larger nodules although this is difficult to distinguish from the bronchiectatic changes.  CT chest September 22, 2016 showed market improvement in the multifocal bilateralcentrallyairspace disease on previous study.  02/2017 -Tx for PNA andpossible amiodarone pulmonary toxicity. Elevated sed rate at 99. Amiodarone was discontinued High-resolution CT chest February 2019>extensive patchy groundglass opacities throughout both lungs involving all lung lobes largely new since September 2018 with mild mediastinal lymphadenopathy  HRCT August 17, 2017>shows some residual groundglass attenuation and scattered areas of mild bronchiectasis. No honeycombing. Several regions of groundglass attenuation on prior study have resolved.          02/01/2018 Follow up : Asthma , ILD , D CHF  Patient presents for a 2-week follow-up.  Patient has underlying asthma during last visit she was having a mild flare with URI.  Patient was recommend to use mucolytic's.  And short prednisone taper.  Patient said her symptoms  improved.  Patient says she was doing well up until 1 week ago when she developed sudden onset of shortness of breath and weakness.  Patient went to her primary care physician and found to be in A. fib.  Patient was unable to get into her cardiologist.  And subsequently went to the emergency room.  During that time she was cardioverted.  Patient says she has felt so much better.  She says that her energy level has improved.  And breathing is the best is been in a long time.  She denies any chest pain shortness of breath or wheezing. She remains on Symbicort twice daily   Patient is on Lasix 20 mg daily as needed.  Says that she uses it sparingly.  Does worry that it makes her constipation worse.  We discussed measures to help her constipation. Such as high-fiber diet and stool softener as needed.  No Known Allergies  Immunization History  Administered Date(s) Administered  . Influenza Split 10/11/2011, 11/28/2012, 10/02/2016  . Influenza, High Dose Seasonal PF 11/23/2017  . Influenza,inj,Quad PF,6+ Mos 11/07/2014, 09/12/2015  . Influenza-Unspecified 11/19/2013  . Pneumococcal Conjugate-13 12/25/2015  . Pneumococcal Polysaccharide-23 01/13/2008    Past Medical History:  Diagnosis Date  . Arthritis   . Asthma   . Atrial fibrillation ([place])    a. s/p DCCV in 2015, on Amiodarone and Eliquis  . CHF (congestive heart failure) ([place])   . COPD (chronic obstructive pulmonary disease) ([place])   . Dysrhythmia    afib -hx   . Neuromuscular disorder ([place])    peripheral neuropathy   . Pneumonia    hx of x 2   . PONV (postoperative nausea and vomiting)   .  PVC's (premature ventricular contractions)     Tobacco History: Social History   Tobacco Use  Smoking Status Former Smoker  . Packs/day: 1.00  . Years: 20.00  . Pack years: 20.00  . Types: Cigarettes  . Last attempt to quit: 04/13/1981  . Years since quitting: 36.8  Smokeless Tobacco Never Used   Counseling given: Not  Answered   Outpatient Medications Prior to Visit  Medication Sig Dispense Refill  . acetaminophen (TYLENOL) 325 MG tablet Take 2 tablets (650 mg total) by mouth every 6 (six) hours as needed for mild pain or headache. 30 tablet 0  . albuterol (PROAIR HFA) 108 (90 Base) MCG/ACT inhaler Inhale 2 puffs into the lungs every 4 (four) hours as needed for wheezing or shortness of breath. 18 g 5  . budesonide-formoterol (SYMBICORT) 160-4.5 MCG/ACT inhaler Inhale 2 puffs into the lungs 2 (two) times daily. 10.2 Inhaler 2  . diltiazem (TIAZAC) 120 MG 24 hr capsule TAKE 1 CAPSULE BY MOUTH DAILY (Patient taking differently: Take 120 mg by mouth daily. ) 90 capsule 3  . ELIQUIS 5 MG TABS tablet TAKE 1 TABLET(5 MG) BY MOUTH TWICE DAILY (Patient taking differently: Take 5 mg by mouth daily. ) 180 tablet 1  . famotidine (PEPCID) 20 MG tablet Take 1 tablet (20 mg total) by mouth at bedtime. 90 tablet 1  . fluticasone (FLONASE) 50 MCG/ACT nasal spray Place 1 spray into both nostrils daily. 16 g 2  . fluticasone (FLOVENT HFA) 110 MCG/ACT inhaler Inhale 2 puffs into the lungs 2 (two) times daily.    . furosemide (LASIX) 20 MG tablet Take 1 tablet (20 mg total) by mouth daily. Use as needed for swelling 90 tablet 3  . mirabegron ER (MYRBETRIQ) 25 MG TB24 tablet Take 1 tablet (25 mg total) by mouth daily. Use for bladder symptoms 30 tablet 6  . montelukast (SINGULAIR) 10 MG tablet TAKE 1 TABLET BY MOUTH DAILY 90 tablet 0  . Multiple Vitamins-Minerals (MULTIVITAMIN WITH MINERALS) tablet Take 1 tablet by mouth daily.    . pantoprazole (PROTONIX) 40 MG tablet Take 1 tablet (40 mg total) by mouth daily before breakfast. 90 tablet 3  . traMADol (ULTRAM) 50 MG tablet TAKE 1/2 TO 1 TABLET BY MOUTH EVERY 8 HOURS AS NEEDED FOR HIP PAIN (Patient taking differently: Take 50 mg by mouth every 8 (eight) hours as needed for moderate pain. TAKE 1/2 TO 1 TABLET BY MOUTH EVERY 8 HOURS AS NEEDED FOR HIP PAIN) 60 tablet 0  .  triamcinolone cream (KENALOG) 0.1 % Apply 1 application topically 2 (two) times daily. Apply to rash on side as needed (Patient not taking: Reported on 01/25/2018) 30 g 1   No facility-administered medications prior to visit.      Review of Systems:   Constitutional:   No  weight loss, night sweats,  Fevers, chills, fatigue, or  lassitude.  HEENT:   No headaches,  Difficulty swallowing,  Tooth/dental problems, or  Sore throat,                No sneezing, itching, ear ache, nasal congestion, post nasal drip,   CV:  No chest pain,  Orthopnea, PND, swelling in lower extremities, anasarca, dizziness, palpitations, syncope.   GI  No heartburn, indigestion, abdominal pain, nausea, vomiting, diarrhea, change in bowel habits, loss of appetite, bloody stools.   Resp: No excess mucus, no productive cough,  No non-productive cough,  No coughing up of blood.  No change in color of mucus.  No wheezing.  No chest wall deformity  Skin: no rash or lesions.  GU: no dysuria, change in color of urine, no urgency or frequency.  No flank pain, no hematuria   MS:  No joint pain or swelling.  No decreased range of motion.  No back pain.    Physical Exam  Pulse 76   Ht 5\' 3"  (1.6 m)   Wt 175 lb 6.4 oz (79.6 kg)   SpO2 96%   BMI 31.07 kg/m   GEN: A/Ox3; pleasant , NAD, elderly  HEENT:  [place]/AT,  EACs-clear, TMs-wnl, NOSE-clear, THROAT-clear, no lesions, no postnasal drip or exudate noted.   NECK:  Supple w/ fair ROM; no JVD; normal carotid impulses w/o bruits; no thyromegaly or nodules palpated; no lymphadenopathy.    RESP  Clear  P & A; w/o, wheezes/ rales/ or rhonchi. no accessory muscle use, no dullness to percussion  CARD:  RRR, no m/r/g, tr  peripheral edema, pulses intact, no cyanosis or clubbing.  GI:   Soft & nt; nml bowel sounds; no organomegaly or masses detected.   Musco: Warm bil, no deformities or joint swelling noted.   Neuro: alert, no focal deficits noted.    Skin: Warm, no  lesions or rashes    Lab Results:  CBC    Component Value Date/Time   WBC 10.7 (H) 01/27/2018 1337   RBC 4.24 01/27/2018 1337   HGB 9.8 (L) 01/27/2018 1337   HGB 10.2 (L) 03/03/2017 1015   HCT 32.9 (L) 01/27/2018 1337   HCT 33.3 (L) 03/03/2017 1015   PLT 282 01/27/2018 1337   PLT 467 (H) 03/03/2017 1015   MCV 77.6 (L) 01/27/2018 1337   MCV 77 (L) 03/03/2017 1015   MCH 23.1 (L) 01/27/2018 1337   MCHC 29.8 (L) 01/27/2018 1337   RDW 19.4 (H) 01/27/2018 1337   RDW 16.7 (H) 03/03/2017 1015   LYMPHSABS 2.1 01/27/2018 1337   LYMPHSABS 1.7 03/03/2017 1015   MONOABS 1.4 (H) 01/27/2018 1337   EOSABS 0.2 01/27/2018 1337   EOSABS 0.3 03/03/2017 1015   BASOSABS 0.1 01/27/2018 1337   BASOSABS 0.0 03/03/2017 1015    BMET    Component Value Date/Time   NA 137 01/27/2018 1337   NA 141 03/03/2017 1015   K 3.7 01/27/2018 1337   CL 107 01/27/2018 1337   CO2 25 01/27/2018 1337   GLUCOSE 116 (H) 01/27/2018 1337   BUN 30 (H) 01/27/2018 1337   BUN 19 03/03/2017 1015   CREATININE 1.64 (H) 01/27/2018 1337   CREATININE 1.25 (H) 01/02/2014 1722   CALCIUM 8.8 (L) 01/27/2018 1337   CALCIUM 9.3 04/22/2011 0000   GFRNONAA 27 (L) 01/27/2018 1337   GFRNONAA 49 (L) 04/22/2011 0000   GFRAA 31 (L) 01/27/2018 1337   GFRAA 56 (L) 04/22/2011 0000    BNP    Component Value Date/Time   BNP 120.1 (H) 08/11/2016 1439   BNP 176.2 (H) 06/02/2016 1659   BNP 86.4 07/15/2013 1442    ProBNP    Component Value Date/Time   PROBNP 104.0 (H) 02/15/2013 1323    Imaging: Dg Chest 2 View  Result Date: 01/27/2018 CLINICAL DATA:  Fatigue and shortness of breath since 01/22/2017. EXAM: CHEST - 2 VIEW COMPARISON:  PA and lateral chest 01/25/2018 and 01/18/2018. CT chest 03/04/2017. FINDINGS: Prominence of the pulmonary interstitium appears unchanged. No consolidative process, pneumothorax or effusion. Heart size is normal. Aortic atherosclerosis is noted. No acute or focal bony abnormality. IMPRESSION: No  acute disease.  No change in prominence of the pulmonary interstitium Atherosclerosis. Electronically Signed   By: Inge Rise M.D.   On: 01/27/2018 13:25   Dg Chest 2 View  Result Date: 01/26/2018 CLINICAL DATA:  Shortness of breath on exertion. Bladder cancer. Interstitial lung disease. EXAM: CHEST - 2 VIEW COMPARISON:  01/18/2018 and CT chest 08/17/2017. FINDINGS: Trachea is midline. Heart size stable. Thoracic aorta is calcified. Mild interstitial coarsening, better seen on 08/17/2017. Prominent epicardial fat. Calcified granuloma in the left upper lobe. Lungs are otherwise clear. No pleural fluid. IMPRESSION: 1. No acute findings. 2. Interstitial lung disease is better visualized and characterized on 08/17/2017. 3.  Aortic atherosclerosis (ICD10-170.0). Electronically Signed   By: Lorin Picket M.D.   On: 01/26/2018 08:25   Dg Chest 2 View  Result Date: 01/18/2018 CLINICAL DATA:  Shortness of breath. EXAM: CHEST - 2 VIEW COMPARISON:  11/09/2017. 03/03/2017.  CT 08/17/2017. FINDINGS: Mediastinum hilar structures normal. Stable cardiomegaly with normal pulmonary vascularity. Diffuse bilateral from interstitial prominence most consistent chronic interstitial lung disease. No interim change. No focal alveolar infiltrate. No pleural effusion or pneumothorax. Degenerative changes scoliosis thoracolumbar spine. IMPRESSION: Number changes of chronic interstitial lung disease again noted. Similar findings noted on prior exam. No focal alveolar infiltrate noted. 2.  Stable cardiomegaly. Electronically Signed   By: Marcello Moores  Register   On: 01/18/2018 11:20      PFT Results Latest Ref Rng & Units 03/19/2014  FVC-Pre L 1.58  FVC-Predicted Pre % 77  FVC-Post L 1.70  FVC-Predicted Post % 83  Pre FEV1/FVC % % 74  Post FEV1/FCV % % 75  FEV1-Pre L 1.17  FEV1-Predicted Pre % 78  FEV1-Post L 1.28  DLCO UNC% % 68  DLCO COR %Predicted % 103  TLC L 3.43  TLC % Predicted % 72  RV % Predicted % 17    No  results found for: NITRICOXIDE      Assessment & Plan:   Intrinsic asthma Recent flare with URI , now resolved   Plan  Patient Instructions  Low salt diet .  Lasix 20mg  daily As needed  For swelling  Continue on Symbicort 2 puffs Twice daily  . Rinse after use.  High fiber diet.  Stool softner daily As needed  Constipation  Follow up with  Dr. [name]  Or Dortha Neighbors NP 3 months and As needed   Please contact office for sooner follow up if symptoms do not improve or worsen or seek emergency care       ILD (interstitial lung disease) ([place]) Stable changes on CT chest  Cont to monitor   Chronic diastolic heart failure ([place]) Appears stable w/out overt volume overload   Plan  Patient Instructions  Low salt diet .  Lasix 20mg  daily As needed  For swelling  Continue on Symbicort 2 puffs Twice daily  . Rinse after use.  High fiber diet.  Stool softner daily As needed  Constipation  Follow up with  Dr. [name]  Or Gaelan Glennon NP 3 months and As needed   Please contact office for sooner follow up if symptoms do not improve or worsen or seek emergency care       Persistent atrial fibrillation Recent flare of A FIB resolved after cardioversion in ER  Cont w/ current regimen , remains on eliquis  follow up with cards as planned.      [name], NP 02/01/2018

## 2018-02-01 NOTE — Assessment & Plan Note (Signed)
Stable changes on CT chest  Cont to monitor

## 2018-02-01 NOTE — Assessment & Plan Note (Signed)
Recent flare of A FIB resolved after cardioversion in ER  Cont w/ current regimen , remains on eliquis  follow up with cards as planned.

## 2018-02-08 DIAGNOSIS — Z5111 Encounter for antineoplastic chemotherapy: Secondary | ICD-10-CM | POA: Diagnosis not present

## 2018-02-08 DIAGNOSIS — C678 Malignant neoplasm of overlapping sites of bladder: Secondary | ICD-10-CM | POA: Diagnosis not present

## 2018-02-10 ENCOUNTER — Ambulatory Visit (INDEPENDENT_AMBULATORY_CARE_PROVIDER_SITE_OTHER): Payer: Medicare Other | Admitting: Cardiology

## 2018-02-10 ENCOUNTER — Encounter: Payer: Self-pay | Admitting: Cardiology

## 2018-02-10 VITALS — BP 120/60 | HR 67 | Ht 63.0 in | Wt 174.4 lb

## 2018-02-10 DIAGNOSIS — N289 Disorder of kidney and ureter, unspecified: Secondary | ICD-10-CM | POA: Diagnosis not present

## 2018-02-10 DIAGNOSIS — Z8679 Personal history of other diseases of the circulatory system: Secondary | ICD-10-CM | POA: Diagnosis not present

## 2018-02-10 DIAGNOSIS — I480 Paroxysmal atrial fibrillation: Secondary | ICD-10-CM | POA: Diagnosis not present

## 2018-02-10 DIAGNOSIS — Z7901 Long term (current) use of anticoagulants: Secondary | ICD-10-CM

## 2018-02-10 DIAGNOSIS — I5032 Chronic diastolic (congestive) heart failure: Secondary | ICD-10-CM | POA: Diagnosis not present

## 2018-02-10 DIAGNOSIS — J849 Interstitial pulmonary disease, unspecified: Secondary | ICD-10-CM | POA: Diagnosis not present

## 2018-02-10 DIAGNOSIS — Z79899 Other long term (current) drug therapy: Secondary | ICD-10-CM | POA: Diagnosis not present

## 2018-02-10 NOTE — Progress Notes (Signed)
02/10/2018 Crystal Kerr   [birth year]  [id number]  Primary Physician Copland, Gay Filler, MD Primary Cardiologist: Dr [name]  HPI:  Patient is a delightful 85 year old female with a history of PAF. This was diagnosed in 2015 in the setting of a viral pneumonia.  She was placed on amiodarone and Eliquis at that time.  Amiodarone was later discontinued for possible interstitial lung disease.  She is followed by [facility]. Her last echocardiogram was in 2015 and showed normal LV function with normal wall thickness and mild to moderate left atrial enlargement.  She carries a diagnosis of presumptive diastolic heart failure.  She has lower extremity edema unless she takes her diuretics, but she doesn't like taking them.  She is on Eliquis 5 mg BID. I saw her last in Dec 2019 prior to invasive bladder cancer Rx which she had 12/24/17.  In early Jan she developed a URI.  She was seen by pulmonary and placed on a steroid dose pack and encouraged to take her Lasix for a few days in a row. She presented about a week later on 01/27/2018 with AF and was cardioverted in the ED and discharged.  Labs then show her SCr was elevated 1.64, Troponin negative, CXR showed NAD.  She is in the office today for follow up.  Overall she feels well.  She has some LE edema on exam and coarse rhonchi bilaterally.  She c/o a cough.    Current Outpatient Medications  Medication Sig Dispense Refill  . acetaminophen (TYLENOL) 325 MG tablet Take 2 tablets (650 mg total) by mouth every 6 (six) hours as needed for mild pain or headache. 30 tablet 0  . albuterol (PROAIR HFA) 108 (90 Base) MCG/ACT inhaler Inhale 2 puffs into the lungs every 4 (four) hours as needed for wheezing or shortness of breath. 18 g 5  . budesonide-formoterol (SYMBICORT) 160-4.5 MCG/ACT inhaler Inhale 2 puffs into the lungs 2 (two) times daily. 10.2 Inhaler 2  . diltiazem (TIAZAC) 120 MG 24 hr capsule TAKE 1 CAPSULE BY MOUTH DAILY (Patient  taking differently: Take 120 mg by mouth daily. ) 90 capsule 3  . ELIQUIS 5 MG TABS tablet TAKE 1 TABLET(5 MG) BY MOUTH TWICE DAILY (Patient taking differently: Take 5 mg by mouth daily. ) 180 tablet 1  . famotidine (PEPCID) 20 MG tablet Take 1 tablet (20 mg total) by mouth at bedtime. 90 tablet 1  . fluticasone (FLONASE) 50 MCG/ACT nasal spray Place 1 spray into both nostrils daily. 16 g 2  . fluticasone (FLOVENT HFA) 110 MCG/ACT inhaler Inhale 2 puffs into the lungs 2 (two) times daily.    . furosemide (LASIX) 20 MG tablet Take 1 tablet (20 mg total) by mouth daily. Use as needed for swelling 90 tablet 3  . mirabegron ER (MYRBETRIQ) 25 MG TB24 tablet Take 1 tablet (25 mg total) by mouth daily. Use for bladder symptoms 30 tablet 6  . montelukast (SINGULAIR) 10 MG tablet TAKE 1 TABLET BY MOUTH DAILY 90 tablet 0  . Multiple Vitamins-Minerals (MULTIVITAMIN WITH MINERALS) tablet Take 1 tablet by mouth daily.    . pantoprazole (PROTONIX) 40 MG tablet Take 1 tablet (40 mg total) by mouth daily before breakfast. 90 tablet 3  . traMADol (ULTRAM) 50 MG tablet TAKE 1/2 TO 1 TABLET BY MOUTH EVERY 8 HOURS AS NEEDED FOR HIP PAIN (Patient taking differently: Take 50 mg by mouth every 8 (eight) hours as needed for moderate pain. TAKE 1/2 TO 1 TABLET  BY MOUTH EVERY 8 HOURS AS NEEDED FOR HIP PAIN) 60 tablet 0  . triamcinolone cream (KENALOG) 0.1 % Apply 1 application topically 2 (two) times daily. Apply to rash on side as needed 30 g 1   No current facility-administered medications for this visit.     No Known Allergies  Past Medical History:  Diagnosis Date  . Arthritis   . Asthma   . Atrial fibrillation ([place])    a. s/p DCCV in 2015, on Amiodarone and Eliquis  . CHF (congestive heart failure) ([place])   . COPD (chronic obstructive pulmonary disease) ([place])   . Dysrhythmia    afib -hx   . Neuromuscular disorder ([place])    peripheral neuropathy   . Pneumonia    hx of x 2   . PONV (postoperative nausea and  vomiting)   . PVC's (premature ventricular contractions)     Social History   Socioeconomic History  . Marital status: Divorced    Spouse name: Not on file  . Number of children: Not on file  . Years of education: Not on file  . Highest education level: Not on file  Occupational History  . Occupation: Retired    Fish farm manager: RETIRED  Social Needs  . Financial resource strain: Not on file  . Food insecurity:    Worry: Not on file    Inability: Not on file  . Transportation needs:    Medical: Not on file    Non-medical: Not on file  Tobacco Use  . Smoking status: Former Smoker    Packs/day: 1.00    Years: 20.00    Pack years: 20.00    Types: Cigarettes    Last attempt to quit: 04/13/1981    Years since quitting: 36.8  . Smokeless tobacco: Never Used  Substance and Sexual Activity  . Alcohol use: Yes    Comment: Occasional  . Drug use: No  . Sexual activity: Never  Lifestyle  . Physical activity:    Days per week: Not on file    Minutes per session: Not on file  . Stress: Not on file  Relationships  . Social connections:    Talks on phone: Not on file    Gets together: Not on file    Attends religious service: Not on file    Active member of club or organization: Not on file    Attends meetings of clubs or organizations: Not on file    Relationship status: Not on file  . Intimate partner violence:    Fear of current or ex partner: Not on file    Emotionally abused: Not on file    Physically abused: Not on file    Forced sexual activity: Not on file  Other Topics Concern  . Not on file  Social History Narrative  . Not on file     Family History  Problem Relation Age of Onset  . Heart failure Mother   . Diabetes Father   . Heart disease Other        No family history     Review of Systems: General: negative for chills, fever, night sweats or weight changes.  Cardiovascular: negative for chest pain, dyspnea on exertion, orthopnea, palpitations, paroxysmal  nocturnal dyspnea Dermatological: negative for rash Respiratory: negative for wheezing Urologic: negative for hematuria Abdominal: negative for nausea, vomiting, diarrhea, bright red blood per rectum, melena, or hematemesis Neurologic: negative for visual changes, syncope, or dizziness All other systems reviewed and are otherwise negative except as noted  above.    Blood pressure 120/60, pulse 67, height 5\' 3"  (1.6 m), weight 174 lb 6.4 oz (79.1 kg).  General appearance: alert, cooperative, no distress and moderately obese Neck: no JVD Lungs: bilateral coarse rhonchi Heart: regular rate and rhythm Extremities: trace edema Skin: pale, warm Neurologic: Grossly normal  EKG NSR-67  ASSESSMENT AND PLAN:   History of atrial fibrillation PAF Feb 2015 in setting of viral illness. Amiodarone stopped secondary to possible interstitial lung disease. Recurrent PAF 01/27/2018 in setting of URI- cardioverted in ED. NSR today in the office  Current use of long term anticoagulation CHADS VASC= 4, on Eliquis. This dose may need to be decreased if her f/u BMP in two weeks shows her SCr > 1.5   Chronic diastolic heart failure ([place]) Her last echo was in 2015- normal LVF, normal wall thickness, mild to mod LAE.  ILD (interstitial lung disease) ([place]) Followed by [facility]- Amiodarone stopped previously. Recent URI treated with steroids.   Presumed diastolic CHF Not completely convinced.  Her weight is not significantly elevated.  Her last SCr was elevated.  Her LE edema could be from Diltiazem and her abnormal lung exam could be secondary to her underlying pulmonary disease.   PLAN  I suggested she take her Lasix MWF, she hasn't taken it in some time. Check BMP in two weeks then f/u OV.  Plan to decrease her Eliquis to 2.5 mg BID if her SCr is > 1.5.   Kerin Ransom PA-C 02/10/2018 2:48 PM

## 2018-02-10 NOTE — Patient Instructions (Addendum)
Medication Instructions:  Continue current medications If you need a refill on your cardiac medications before your next appointment, please call your pharmacy.   Lab work: BMET (non-fasting lab) in 2 weeks If you have labs (blood work) drawn today and your tests are completely normal, you will receive your results only by: Marland Kitchen MyChart Message (if you have MyChart) OR . A paper copy in the mail If you have any lab test that is abnormal or we need to change your treatment, we will call you to review the results.  Follow-Up: At [facility], you and your health needs are our priority.  As part of our continuing mission to provide you with exceptional heart care, we have created designated Provider Care Teams.  These Care Teams include your primary Cardiologist (physician) and Advanced Practice Providers (APPs -  Physician Assistants and Nurse Practitioners) who all work together to provide you with the care you need, when you need it. . You will need a follow up appointment in 3 weeks with [facility] PA on Thursday Feb 20 @ 9:30am  Any Other Special Instructions Will Be Listed Below (If Applicable).

## 2018-02-13 ENCOUNTER — Emergency Department (HOSPITAL_BASED_OUTPATIENT_CLINIC_OR_DEPARTMENT_OTHER): Payer: Medicare Other

## 2018-02-13 ENCOUNTER — Encounter (HOSPITAL_BASED_OUTPATIENT_CLINIC_OR_DEPARTMENT_OTHER): Payer: Self-pay | Admitting: Adult Health

## 2018-02-13 ENCOUNTER — Other Ambulatory Visit: Payer: Self-pay

## 2018-02-13 ENCOUNTER — Inpatient Hospital Stay (HOSPITAL_BASED_OUTPATIENT_CLINIC_OR_DEPARTMENT_OTHER)
Admission: EM | Admit: 2018-02-13 | Discharge: 2018-02-15 | DRG: 189 | Disposition: A | Discharge status: Home Health | Payer: Medicare Other | Attending: Internal Medicine | Admitting: Internal Medicine

## 2018-02-13 DIAGNOSIS — R0902 Hypoxemia: Secondary | ICD-10-CM | POA: Diagnosis not present

## 2018-02-13 DIAGNOSIS — J4541 Moderate persistent asthma with (acute) exacerbation: Secondary | ICD-10-CM

## 2018-02-13 DIAGNOSIS — Z79891 Long term (current) use of opiate analgesic: Secondary | ICD-10-CM | POA: Diagnosis not present

## 2018-02-13 DIAGNOSIS — Z7901 Long term (current) use of anticoagulants: Secondary | ICD-10-CM | POA: Diagnosis not present

## 2018-02-13 DIAGNOSIS — D649 Anemia, unspecified: Secondary | ICD-10-CM | POA: Diagnosis not present

## 2018-02-13 DIAGNOSIS — Z7951 Long term (current) use of inhaled steroids: Secondary | ICD-10-CM

## 2018-02-13 DIAGNOSIS — Z79899 Other long term (current) drug therapy: Secondary | ICD-10-CM

## 2018-02-13 DIAGNOSIS — C679 Malignant neoplasm of bladder, unspecified: Secondary | ICD-10-CM | POA: Diagnosis not present

## 2018-02-13 DIAGNOSIS — Z833 Family history of diabetes mellitus: Secondary | ICD-10-CM | POA: Diagnosis not present

## 2018-02-13 DIAGNOSIS — R0602 Shortness of breath: Secondary | ICD-10-CM | POA: Diagnosis not present

## 2018-02-13 DIAGNOSIS — R5383 Other fatigue: Secondary | ICD-10-CM | POA: Diagnosis not present

## 2018-02-13 DIAGNOSIS — I5032 Chronic diastolic (congestive) heart failure: Secondary | ICD-10-CM | POA: Diagnosis not present

## 2018-02-13 DIAGNOSIS — Z9049 Acquired absence of other specified parts of digestive tract: Secondary | ICD-10-CM

## 2018-02-13 DIAGNOSIS — J440 Chronic obstructive pulmonary disease with acute lower respiratory infection: Secondary | ICD-10-CM | POA: Diagnosis present

## 2018-02-13 DIAGNOSIS — Z66 Do not resuscitate: Secondary | ICD-10-CM | POA: Diagnosis not present

## 2018-02-13 DIAGNOSIS — J441 Chronic obstructive pulmonary disease with (acute) exacerbation: Secondary | ICD-10-CM | POA: Diagnosis present

## 2018-02-13 DIAGNOSIS — J9601 Acute respiratory failure with hypoxia: Secondary | ICD-10-CM | POA: Diagnosis not present

## 2018-02-13 DIAGNOSIS — I4819 Other persistent atrial fibrillation: Secondary | ICD-10-CM | POA: Diagnosis present

## 2018-02-13 DIAGNOSIS — J209 Acute bronchitis, unspecified: Secondary | ICD-10-CM | POA: Diagnosis not present

## 2018-02-13 DIAGNOSIS — Z8249 Family history of ischemic heart disease and other diseases of the circulatory system: Secondary | ICD-10-CM | POA: Diagnosis not present

## 2018-02-13 DIAGNOSIS — J849 Interstitial pulmonary disease, unspecified: Secondary | ICD-10-CM | POA: Diagnosis not present

## 2018-02-13 LAB — CBC WITH DIFFERENTIAL/PLATELET
Abs Immature Granulocytes: 0.18 10*3/uL — ABNORMAL HIGH (ref 0.00–0.07)
Basophils Absolute: 0 10*3/uL (ref 0.0–0.1)
Basophils Relative: 0 %
Eosinophils Absolute: 0.2 10*3/uL (ref 0.0–0.5)
Eosinophils Relative: 1 %
HCT: 31.5 % — ABNORMAL LOW (ref 36.0–46.0)
Hemoglobin: 9.4 g/dL — ABNORMAL LOW (ref 12.0–15.0)
Immature Granulocytes: 1 %
Lymphocytes Relative: 10 %
Lymphs Abs: 1.9 10*3/uL (ref 0.7–4.0)
MCH: 22.8 pg — AB (ref 26.0–34.0)
MCHC: 29.8 g/dL — ABNORMAL LOW (ref 30.0–36.0)
MCV: 76.3 fL — ABNORMAL LOW (ref 80.0–100.0)
MONO ABS: 1.5 10*3/uL — AB (ref 0.1–1.0)
Monocytes Relative: 7 %
Neutro Abs: 16.1 10*3/uL — ABNORMAL HIGH (ref 1.7–7.7)
Neutrophils Relative %: 81 %
Platelets: 281 10*3/uL (ref 150–400)
RBC: 4.13 MIL/uL (ref 3.87–5.11)
RDW: 19.5 % — ABNORMAL HIGH (ref 11.5–15.5)
WBC: 19.8 10*3/uL — AB (ref 4.0–10.5)
nRBC: 0 % (ref 0.0–0.2)

## 2018-02-13 LAB — BASIC METABOLIC PANEL
Anion gap: 8 (ref 5–15)
BUN: 33 mg/dL — ABNORMAL HIGH (ref 8–23)
CO2: 24 mmol/L (ref 22–32)
Calcium: 8.7 mg/dL — ABNORMAL LOW (ref 8.9–10.3)
Chloride: 99 mmol/L (ref 98–111)
Creatinine, Ser: 1.21 mg/dL — ABNORMAL HIGH (ref 0.44–1.00)
GFR calc Af Amer: 45 mL/min — ABNORMAL LOW (ref >60)
GFR calc non Af Amer: 39 mL/min — ABNORMAL LOW (ref >60)
Glucose, Bld: 94 mg/dL (ref 70–99)
Potassium: 3.6 mmol/L (ref 3.5–5.1)
Sodium: 131 mmol/L — ABNORMAL LOW (ref 135–145)

## 2018-02-13 LAB — INFLUENZA PANEL BY PCR (TYPE A & B)
Influenza A By PCR: NEGATIVE
Influenza B By PCR: NEGATIVE

## 2018-02-13 LAB — TROPONIN I: Troponin I: <0.03 ng/mL (ref <0.03)

## 2018-02-13 LAB — BRAIN NATRIURETIC PEPTIDE: B Natriuretic Peptide: 74.2 pg/mL (ref 0.0–100.0)

## 2018-02-13 MED ORDER — METHYLPREDNISOLONE SODIUM SUCC 125 MG IJ SOLR
125.0000 mg | Freq: Once | INTRAMUSCULAR | Status: AC
  Administered 2018-02-13: 125 mg via INTRAVENOUS
  Filled 2018-02-13: qty 2

## 2018-02-13 MED ORDER — ALBUTEROL SULFATE (2.5 MG/3ML) 0.083% IN NEBU
2.5000 mg | INHALATION_SOLUTION | Freq: Once | RESPIRATORY_TRACT | Status: AC
  Administered 2018-02-13: 2.5 mg via RESPIRATORY_TRACT
  Filled 2018-02-13: qty 3

## 2018-02-13 MED ORDER — IPRATROPIUM-ALBUTEROL 0.5-2.5 (3) MG/3ML IN SOLN
3.0000 mL | Freq: Once | RESPIRATORY_TRACT | Status: AC
  Administered 2018-02-13: 3 mL via RESPIRATORY_TRACT
  Filled 2018-02-13: qty 3

## 2018-02-13 MED ORDER — ALBUTEROL SULFATE (2.5 MG/3ML) 0.083% IN NEBU
5.0000 mg | INHALATION_SOLUTION | Freq: Once | RESPIRATORY_TRACT | Status: AC
  Administered 2018-02-13: 5 mg via RESPIRATORY_TRACT
  Filled 2018-02-13: qty 6

## 2018-02-13 NOTE — ED Triage Notes (Signed)
PResents with fatigue, SOB and she is being treated for bladder cancer. She has been sick for one week with cough and flu like symptoms. She reports that she is coughing up mucous.

## 2018-02-13 NOTE — ED Provider Notes (Signed)
[place] EMERGENCY DEPARTMENT Provider Note   CSN: [id number] Arrival date & time: 02/13/18  1446     History   Chief Complaint Chief Complaint  Patient presents with  . Fatigue    HPI KEYANAH KOZICKI is a 85 y.o. female.  Patient is a 85 year old female with a history of atrial fibrillation on Eliquis, CHF, COPD, bladder cancer  who presents with cough and fatigue.  She reports a 3 to 4-day history of runny nose and nasal congestion.  She is also had a cough which is productive of some white/yellow sputum.  Her breathing is gotten worse over the last 2 days with increased wheezing and coughing spells.  She denies any chest pain.  She denies any known fevers.  No nausea or vomiting.  No increase in leg swelling.  She does have some chronic baseline swelling.  She did not take her Lasix for the last 2 days but has not noticed any increased swelling.  She has been using her inhalers at home over the last couple days with no improvement in symptoms.     Past Medical History:  Diagnosis Date  . Arthritis   . Asthma   . Atrial fibrillation ([place])    a. s/p DCCV in 2015, on Amiodarone and Eliquis  . CHF (congestive heart failure) ([place])   . COPD (chronic obstructive pulmonary disease) ([place])   . Dysrhythmia    afib -hx   . Neuromuscular disorder ([place])    peripheral neuropathy   . Pneumonia    hx of x 2   . PONV (postoperative nausea and vomiting)   . PVC's (premature ventricular contractions)     Patient Active Problem List   Diagnosis Date Noted  . Acute respiratory failure with hypoxia ([place]) 02/13/2018  . Renal insufficiency 02/10/2018  . Acute bronchitis 01/18/2018  . Pre-operative clearance 12/22/2017  . Chronic diastolic heart failure ([place]) 11/23/2017  . ILD (interstitial lung disease) ([place]) 11/23/2017  . CAP (community acquired pneumonia) 03/04/2017  . Current use of long term anticoagulation 07/31/2016  . Atypical pneumonia 06/02/2016  . History  of atrial fibrillation 06/02/2016  . Lower extremity edema 12/19/2013  . 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 Benign paroxysmal positional vertigo 09/13/2013  . Upper airway cough syndrome 07/10/2013  . Allergic rhinitis 07/05/2013  . Persistent atrial fibrillation 03/30/2013  . Shortness of breath 02/06/2013  . Acute on chronic diastolic congestive heart failure ([place]) 02/04/2013  . Arthritis of spine 07/01/2012  . Intrinsic asthma 07/01/2012  . Dyspnea 04/12/2012  . PVC's (premature ventricular contractions) 04/24/2011  . Murmur, cardiac 04/24/2011    Past Surgical History:  Procedure Laterality Date  . APPENDECTOMY    . CARDIOVERSION N/A 03/08/2013   Procedure: CARDIOVERSION;  Surgeon: Larey Dresser, MD;  Location: [facility];  Service: Cardiovascular;  Laterality: N/A;  . CHOLECYSTECTOMY    . oophrectomy    . TEE WITHOUT CARDIOVERSION N/A 03/08/2013   Procedure: TRANSESOPHAGEAL ECHOCARDIOGRAM (TEE);  Surgeon: Larey Dresser, MD;  Location: [facility];  Service: Cardiovascular;  Laterality: N/A;  . TONSILLECTOMY    . TRANSURETHRAL RESECTION OF BLADDER TUMOR WITH GYRUS (TURBT-GYRUS)  05/13/2016   urology wiht [facility]  . TRANSURETHRAL RESECTION OF BLADDER TUMOR WITH MITOMYCIN-C N/A 12/24/2017   Procedure: TRANSURETHRAL RESECTION OF BLADDER TUMOR;  Surgeon: Lucas Mallow, MD;  Location: [facility];  Service: Urology;  Laterality: N/A;     OB  History   No obstetric history on file.      Home Medications    Prior to Admission medications   Medication Sig Start Date End Date Taking? Authorizing Provider  acetaminophen (TYLENOL) 325 MG tablet Take 2 tablets (650 mg total) by mouth every 6 (six) hours as needed for mild pain or headache. 06/05/16   Raiford Noble Latif, DO  albuterol [facility] HFA) 108 (90 Base) MCG/ACT inhaler Inhale 2 puffs into the lungs every 4 (four) hours as needed for wheezing or shortness of  breath. 08/18/17   Shelda Pal, DO  budesonide-formoterol (SYMBICORT) 160-4.5 MCG/ACT inhaler Inhale 2 puffs into the lungs 2 (two) times daily. 12/29/17   Copland, Gay Filler, MD  diltiazem (TIAZAC) 120 MG 24 hr capsule TAKE 1 CAPSULE BY MOUTH DAILY Patient taking differently: Take 120 mg by mouth daily.  05/13/17   Crenshaw, Denice Bors, MD  ELIQUIS 5 MG TABS tablet TAKE 1 TABLET(5 MG) BY MOUTH TWICE DAILY Patient taking differently: Take 5 mg by mouth daily.  08/30/17   Lelon Perla, MD  famotidine (PEPCID) 20 MG tablet Take 1 tablet (20 mg total) by mouth at bedtime. 01/21/18   Copland, Gay Filler, MD  fluticasone (FLONASE) 50 MCG/ACT nasal spray Place 1 spray into both nostrils daily. 04/22/17   Chesley Mires, MD  fluticasone (FLOVENT HFA) 110 MCG/ACT inhaler Inhale 2 puffs into the lungs 2 (two) times daily.    [provider]  furosemide (LASIX) 20 MG tablet Take 1 tablet (20 mg total) by mouth daily. Use as needed for swelling 08/16/17   Copland, Gay Filler, MD  mirabegron ER (MYRBETRIQ) 25 MG TB24 tablet Take 1 tablet (25 mg total) by mouth daily. Use for bladder symptoms 07/07/17   Copland, Gay Filler, MD  montelukast (SINGULAIR) 10 MG tablet TAKE 1 TABLET BY MOUTH DAILY 01/03/18   Copland, Gay Filler, MD  Multiple Vitamins-Minerals (MULTIVITAMIN WITH MINERALS) tablet Take 1 tablet by mouth daily.    [provider]  pantoprazole (PROTONIX) 40 MG tablet Take 1 tablet (40 mg total) by mouth daily before breakfast. 09/18/16   Copland, Gay Filler, MD  traMADol (ULTRAM) 50 MG tablet TAKE 1/2 TO 1 TABLET BY MOUTH EVERY 8 HOURS AS NEEDED FOR HIP PAIN Patient taking differently: Take 50 mg by mouth every 8 (eight) hours as needed for moderate pain. TAKE 1/2 TO 1 TABLET BY MOUTH EVERY 8 HOURS AS NEEDED FOR HIP PAIN 10/22/17   Copland, Gay Filler, MD  triamcinolone cream (KENALOG) 0.1 % Apply 1 application topically 2 (two) times daily. Apply to rash on side as needed 12/25/15   Copland,  Gay Filler, MD    Family History Family History  Problem Relation Age of Onset  . Heart failure Mother   . Diabetes Father   . Heart disease Other        No family history    Social History Social History   Tobacco Use  . Smoking status: Former Smoker    Packs/day: 1.00    Years: 20.00    Pack years: 20.00    Types: Cigarettes    Last attempt to quit: 04/13/1981    Years since quitting: 36.8  . Smokeless tobacco: Never Used  Substance Use Topics  . Alcohol use: Yes    Comment: Occasional  . Drug use: No     Allergies   Patient has no known allergies.   Review of Systems Review of Systems  Constitutional: Positive for fatigue. Negative  for chills, diaphoresis and fever.  HENT: Positive for congestion and rhinorrhea. Negative for sneezing.   Eyes: Negative.   Respiratory: Positive for cough, shortness of breath and wheezing. Negative for chest tightness.   Cardiovascular: Negative for chest pain and leg swelling.  Gastrointestinal: Negative for abdominal pain, blood in stool, diarrhea, nausea and vomiting.  Genitourinary: Negative for difficulty urinating, flank pain, frequency and hematuria.  Musculoskeletal: Negative for arthralgias and back pain.  Skin: Negative for rash.  Neurological: Negative for dizziness, speech difficulty, weakness, numbness and headaches.     Physical Exam Updated Vital Signs BP (!) 148/60 (BP Location: Left Arm)   Pulse 80   Temp 98.4 F (36.9 C) (Oral)   Resp (!) 22   Ht 5\' 3"  (1.6 m)   Wt 79.1 kg   SpO2 97%   BMI 30.89 kg/m   Physical Exam Constitutional:      Appearance: She is well-developed.  HENT:     Head: Normocephalic and atraumatic.  Eyes:     Pupils: Pupils are equal, round, and reactive to light.  Neck:     Musculoskeletal: Normal range of motion and neck supple.  Cardiovascular:     Rate and Rhythm: Normal rate and regular rhythm.     Heart sounds: Normal heart sounds.  Pulmonary:     Effort: Pulmonary  effort is normal. Tachypnea present. No accessory muscle usage or respiratory distress.     Breath sounds: Wheezing present. No rales.  Chest:     Chest wall: No tenderness.  Abdominal:     General: Bowel sounds are normal.     Palpations: Abdomen is soft.     Tenderness: There is no abdominal tenderness. There is no guarding or rebound.  Musculoskeletal: Normal range of motion.     Comments: 1+ pain edema bilaterally to the lower extremities with a slight increase in edema in the left leg as compared to the right, this is baseline per patient's daughter.  Lymphadenopathy:     Cervical: No cervical adenopathy.  Skin:    General: Skin is warm and dry.     Findings: No rash.  Neurological:     Mental Status: She is alert and oriented to person, place, and time.      ED Treatments / Results  Labs (all labs ordered are listed, but only abnormal results are displayed) Labs Reviewed  BASIC METABOLIC PANEL - Abnormal; Notable for the following components:      Result Value   Sodium 131 (*)    BUN 33 (*)    Creatinine, Ser 1.21 (*)    Calcium 8.7 (*)    GFR calc non Af Amer 39 (*)    GFR calc Af Amer 45 (*)    All other components within normal limits  CBC WITH DIFFERENTIAL/PLATELET - Abnormal; Notable for the following components:   WBC 19.8 (*)    Hemoglobin 9.4 (*)    HCT 31.5 (*)    MCV 76.3 (*)    MCH 22.8 (*)    MCHC 29.8 (*)    RDW 19.5 (*)    Neutro Abs 16.1 (*)    Monocytes Absolute 1.5 (*)    Abs Immature Granulocytes 0.18 (*)    All other components within normal limits  BRAIN NATRIURETIC PEPTIDE  TROPONIN I  INFLUENZA PANEL BY PCR (TYPE A & B)    EKG EKG Interpretation  Date/Time:  Sunday February 13 2018 15:24:00 EST Ventricular Rate:  80 PR Interval:  QRS Duration: 89 QT Interval:  377 QTC Calculation: 435 R Axis:   18 Text Interpretation:  Sinus rhythm Low voltage, precordial leads since last tracing no significant change Confirmed by Malvin Johns [id number]) on 02/13/2018 4:25:37 PM   Radiology Dg Chest 2 View  Result Date: 02/13/2018 CLINICAL DATA:  Fatigue and shortness of breath. EXAM: CHEST - 2 VIEW COMPARISON:  01/27/2018 FINDINGS: Lungs are hyperexpanded Interstitial markings are diffusely coarsened with chronic features. Cardiopericardial silhouette is at upper limits of normal for size. The visualized bony structures of the thorax are intact. Telemetry leads overlie the chest. IMPRESSION: Emphysema without acute cardiopulmonary findings. Electronically Signed   By: Misty Stanley M.D.   On: 02/13/2018 15:50    Procedures Procedures (including critical care time)  Medications Ordered in ED Medications  ipratropium-albuterol (DUONEB) 0.5-2.5 (3) MG/3ML nebulizer solution 3 mL (3 mLs Nebulization Given 02/13/18 1527)  albuterol (PROVENTIL) (2.5 MG/3ML) 0.083% nebulizer solution 2.5 mg (2.5 mg Nebulization Given 02/13/18 1527)  methylPREDNISolone sodium succinate (SOLU-MEDROL) 125 mg/2 mL injection 125 mg (125 mg Intravenous Given 02/13/18 1634)  albuterol (PROVENTIL) (2.5 MG/3ML) 0.083% nebulizer solution 5 mg (5 mg Nebulization Given 02/13/18 1752)     Initial Impression / Assessment and Plan / ED Course  I have reviewed the triage vital signs and the nursing notes.  Pertinent labs & imaging results that were available during my care of the patient were reviewed by me and considered in my medical decision making (see chart for details).    Patient is a 85 year old female who presents with wheezing and shortness of breath along with URI symptoms.  Chest x-ray shows no evidence of pneumonia.  Her labs are non-concerning.  She does have an elevated WBC count, but no fever.  She was given several nebulizer treatments in the ED as well as Solu-Medrol.  She is feeling much better and is breathing without accessory muscles or significant tachypnea however when she tries to ambulate even short distances she gets markedly short of breath and  her oxygen saturations dropped to 88 to 89% on room air.  She does not use oxygen at home.  I will consult the hospitalist for admission for further treatment.  I spoke with Dr. Hal Hope who will admit the patient.  CRITICAL CARE Performed by: Malvin Johns Total critical care time: 60 minutes Critical care time was exclusive of separately billable procedures and treating other patients. Critical care was necessary to treat or prevent imminent or life-threatening deterioration. Critical care was time spent personally by me on the following activities: development of treatment plan with patient and/or surrogate as well as nursing, discussions with consultants, evaluation of patient's response to treatment, examination of patient, obtaining history from patient or surrogate, ordering and performing treatments and interventions, ordering and review of laboratory studies, ordering and review of radiographic studies, pulse oximetry and re-evaluation of patient's condition.    Final Clinical Impressions(s) / ED Diagnoses   Final diagnoses:  COPD exacerbation [facility])  Hypoxia    ED Discharge Orders    None       Malvin Johns, MD 02/13/18 2345

## 2018-02-13 NOTE — ED Notes (Signed)
[facility] notified (Taryn) - patient ready for transport 

## 2018-02-13 NOTE — ED Notes (Signed)
Family at bedside, admission discussed

## 2018-02-13 NOTE — ED Notes (Addendum)
AMBULATING:  SpO2 88-91% HR 92-94bpm  Using cane from home. States she felt very SOB and unsteady. Pt obviously working harder to breathe after approx 562ft, pt sat in wheelchair and wheeled back to bed. After laying down SpO2 92% and HR 88bpm.  MD at bedside and aware, Pt placed on 2LPM supplemental O2 via [place]. SpO2 now 97%.

## 2018-02-14 ENCOUNTER — Encounter (HOSPITAL_COMMUNITY): Payer: Self-pay | Admitting: Internal Medicine

## 2018-02-14 ENCOUNTER — Observation Stay (HOSPITAL_COMMUNITY): Payer: Medicare Other

## 2018-02-14 DIAGNOSIS — R0602 Shortness of breath: Secondary | ICD-10-CM | POA: Diagnosis not present

## 2018-02-14 DIAGNOSIS — I4819 Other persistent atrial fibrillation: Secondary | ICD-10-CM

## 2018-02-14 DIAGNOSIS — Z9049 Acquired absence of other specified parts of digestive tract: Secondary | ICD-10-CM | POA: Diagnosis not present

## 2018-02-14 DIAGNOSIS — J849 Interstitial pulmonary disease, unspecified: Secondary | ICD-10-CM | POA: Diagnosis present

## 2018-02-14 DIAGNOSIS — Z8249 Family history of ischemic heart disease and other diseases of the circulatory system: Secondary | ICD-10-CM | POA: Diagnosis not present

## 2018-02-14 DIAGNOSIS — Z7951 Long term (current) use of inhaled steroids: Secondary | ICD-10-CM | POA: Diagnosis not present

## 2018-02-14 DIAGNOSIS — J440 Chronic obstructive pulmonary disease with acute lower respiratory infection: Secondary | ICD-10-CM | POA: Diagnosis present

## 2018-02-14 DIAGNOSIS — J9601 Acute respiratory failure with hypoxia: Secondary | ICD-10-CM | POA: Diagnosis present

## 2018-02-14 DIAGNOSIS — I5032 Chronic diastolic (congestive) heart failure: Secondary | ICD-10-CM | POA: Diagnosis present

## 2018-02-14 DIAGNOSIS — Z7901 Long term (current) use of anticoagulants: Secondary | ICD-10-CM | POA: Diagnosis not present

## 2018-02-14 DIAGNOSIS — J209 Acute bronchitis, unspecified: Secondary | ICD-10-CM | POA: Diagnosis present

## 2018-02-14 DIAGNOSIS — Z79899 Other long term (current) drug therapy: Secondary | ICD-10-CM | POA: Diagnosis not present

## 2018-02-14 DIAGNOSIS — Z833 Family history of diabetes mellitus: Secondary | ICD-10-CM | POA: Diagnosis not present

## 2018-02-14 DIAGNOSIS — J204 Acute bronchitis due to parainfluenza virus: Secondary | ICD-10-CM | POA: Diagnosis not present

## 2018-02-14 DIAGNOSIS — Z79891 Long term (current) use of opiate analgesic: Secondary | ICD-10-CM | POA: Diagnosis not present

## 2018-02-14 DIAGNOSIS — J441 Chronic obstructive pulmonary disease with (acute) exacerbation: Secondary | ICD-10-CM | POA: Insufficient documentation

## 2018-02-14 DIAGNOSIS — Z66 Do not resuscitate: Secondary | ICD-10-CM | POA: Diagnosis present

## 2018-02-14 DIAGNOSIS — C679 Malignant neoplasm of bladder, unspecified: Secondary | ICD-10-CM | POA: Diagnosis present

## 2018-02-14 DIAGNOSIS — J200 Acute bronchitis due to Mycoplasma pneumoniae: Secondary | ICD-10-CM | POA: Diagnosis not present

## 2018-02-14 DIAGNOSIS — D649 Anemia, unspecified: Secondary | ICD-10-CM | POA: Diagnosis present

## 2018-02-14 DIAGNOSIS — R5383 Other fatigue: Secondary | ICD-10-CM | POA: Diagnosis not present

## 2018-02-14 LAB — CBC
HCT: 29.4 % — ABNORMAL LOW (ref 36.0–46.0)
Hemoglobin: 8.9 g/dL — ABNORMAL LOW (ref 12.0–15.0)
MCH: 23.1 pg — ABNORMAL LOW (ref 26.0–34.0)
MCHC: 30.3 g/dL (ref 30.0–36.0)
MCV: 76.2 fL — AB (ref 80.0–100.0)
Platelets: 272 10*3/uL (ref 150–400)
RBC: 3.86 MIL/uL — ABNORMAL LOW (ref 3.87–5.11)
RDW: 19.2 % — ABNORMAL HIGH (ref 11.5–15.5)
WBC: 13.8 10*3/uL — ABNORMAL HIGH (ref 4.0–10.5)
nRBC: 0 % (ref 0.0–0.2)

## 2018-02-14 LAB — BASIC METABOLIC PANEL
Anion gap: 8 (ref 5–15)
BUN: 26 mg/dL — AB (ref 8–23)
CO2: 24 mmol/L (ref 22–32)
Calcium: 8.6 mg/dL — ABNORMAL LOW (ref 8.9–10.3)
Chloride: 105 mmol/L (ref 98–111)
Creatinine, Ser: 0.92 mg/dL (ref 0.44–1.00)
GFR calc Af Amer: >60 mL/min (ref >60)
GFR calc non Af Amer: 54 mL/min — ABNORMAL LOW (ref >60)
GLUCOSE: 149 mg/dL — AB (ref 70–99)
Potassium: 3.5 mmol/L (ref 3.5–5.1)
Sodium: 137 mmol/L (ref 135–145)

## 2018-02-14 LAB — TROPONIN I: Troponin I: <0.03 ng/mL (ref <0.03)

## 2018-02-14 LAB — ECHOCARDIOGRAM COMPLETE
Height: 63 in
Weight: 2790.14 oz

## 2018-02-14 MED ORDER — LEVALBUTEROL HCL 0.63 MG/3ML IN NEBU: 0.6300 mg | INHALATION_SOLUTION | Freq: Four times a day (QID) | RESPIRATORY_TRACT | Status: DC | PRN

## 2018-02-14 MED ORDER — FAMOTIDINE 20 MG PO TABS
20.0000 mg | ORAL_TABLET | Freq: Every day | ORAL | Status: DC
  Administered 2018-02-14: 20 mg via ORAL
  Filled 2018-02-14: qty 1

## 2018-02-14 MED ORDER — MONTELUKAST SODIUM 10 MG PO TABS
10.0000 mg | ORAL_TABLET | Freq: Every day | ORAL | Status: DC
  Administered 2018-02-14 – 2018-02-15 (×2): 10 mg via ORAL
  Filled 2018-02-14 (×2): qty 1

## 2018-02-14 MED ORDER — SODIUM CHLORIDE 0.9 % IV SOLN
INTRAVENOUS | Status: AC
  Administered 2018-02-14: 12:00:00 via INTRAVENOUS

## 2018-02-14 MED ORDER — BUDESONIDE 0.25 MG/2ML IN SUSP
0.2500 mg | Freq: Two times a day (BID) | RESPIRATORY_TRACT | Status: DC
  Administered 2018-02-14 – 2018-02-15 (×3): 0.25 mg via RESPIRATORY_TRACT
  Filled 2018-02-14 (×3): qty 2

## 2018-02-14 MED ORDER — ACETAMINOPHEN 325 MG PO TABS
650.0000 mg | ORAL_TABLET | Freq: Four times a day (QID) | ORAL | Status: DC | PRN
  Administered 2018-02-14: 650 mg via ORAL
  Filled 2018-02-14: qty 2

## 2018-02-14 MED ORDER — ONDANSETRON HCL 4 MG/2ML IJ SOLN: 4.0000 mg | Freq: Four times a day (QID) | INTRAMUSCULAR | Status: DC | PRN

## 2018-02-14 MED ORDER — APIXABAN 5 MG PO TABS
5.0000 mg | ORAL_TABLET | Freq: Two times a day (BID) | ORAL | Status: DC
  Administered 2018-02-14 – 2018-02-15 (×3): 5 mg via ORAL
  Filled 2018-02-14 (×3): qty 1

## 2018-02-14 MED ORDER — ACETAMINOPHEN 650 MG RE SUPP: 650.0000 mg | Freq: Four times a day (QID) | RECTAL | Status: DC | PRN

## 2018-02-14 MED ORDER — LEVALBUTEROL HCL 0.63 MG/3ML IN NEBU
0.6300 mg | INHALATION_SOLUTION | Freq: Four times a day (QID) | RESPIRATORY_TRACT | Status: DC
  Administered 2018-02-14 – 2018-02-15 (×6): 0.63 mg via RESPIRATORY_TRACT
  Filled 2018-02-14 (×6): qty 3

## 2018-02-14 MED ORDER — DILTIAZEM HCL ER COATED BEADS 120 MG PO CP24
120.0000 mg | ORAL_CAPSULE | Freq: Every day | ORAL | Status: DC
  Administered 2018-02-14 – 2018-02-15 (×2): 120 mg via ORAL
  Filled 2018-02-14 (×2): qty 1

## 2018-02-14 MED ORDER — IOPAMIDOL (ISOVUE-370) INJECTION 76%
100.0000 mL | Freq: Once | INTRAVENOUS | Status: AC | PRN
  Administered 2018-02-14: 100 mL via INTRAVENOUS

## 2018-02-14 MED ORDER — SODIUM CHLORIDE 0.9 % IV SOLN
100.0000 mg | Freq: Two times a day (BID) | INTRAVENOUS | Status: DC
  Administered 2018-02-14 (×3): 100 mg via INTRAVENOUS
  Filled 2018-02-14 (×4): qty 100

## 2018-02-14 MED ORDER — SODIUM CHLORIDE (PF) 0.9 % IJ SOLN
INTRAMUSCULAR | Status: AC
  Filled 2018-02-14: qty 50

## 2018-02-14 MED ORDER — FLUTICASONE PROPIONATE 50 MCG/ACT NA SUSP: 1.0000 | Freq: Every day | NASAL | Status: DC | PRN

## 2018-02-14 MED ORDER — ONDANSETRON HCL 4 MG PO TABS: 4.0000 mg | ORAL_TABLET | Freq: Four times a day (QID) | ORAL | Status: DC | PRN

## 2018-02-14 MED ORDER — POLYETHYLENE GLYCOL 3350 17 GM/SCOOP PO POWD: 1.0000 | Freq: Once | ORAL | Status: DC

## 2018-02-14 MED ORDER — POLYETHYLENE GLYCOL 3350 17 G PO PACK
17.0000 g | PACK | Freq: Once | ORAL | Status: AC
  Administered 2018-02-14: 17 g via ORAL
  Filled 2018-02-14: qty 1

## 2018-02-14 MED ORDER — TRAMADOL HCL 50 MG PO TABS: 50.0000 mg | ORAL_TABLET | Freq: Three times a day (TID) | ORAL | Status: DC | PRN

## 2018-02-14 MED ORDER — IOPAMIDOL (ISOVUE-370) INJECTION 76%
INTRAVENOUS | Status: AC
  Filled 2018-02-14: qty 100

## 2018-02-14 NOTE — Evaluation (Signed)
Physical Therapy Evaluation Patient Details Name: Crystal Kerr MRN: [id number] DOB: 10-16-1924 Today's Date: 02/14/2018   History of Present Illness  Patient is a 85 y/o female presenting to the ED on 02/13/2018 with primary complaints of SOB and weakness. Patient with a PMH significant for atrial fibrillation, asthma/COPD, CHF. Admitted for Acute respiratory failure with hypoxia likely from acute bronchitis.    Clinical Impression  Ms. Crystal Kerr is a very pleasant 85 y/o female admitted with the above listed diagnosis. Patient reports Mod I with mobility and ADLs prior to admission. Patient today requiring Min A for bed level mobility with Min guard for all OOB mobility for general safety. Currently patient limited primarily to weakness and fatigue. Will recommend HHPT at discharge to continue to progress functional mobility. Will continue to follow.    Follow Up Recommendations Home health PT;Supervision - Intermittent    Equipment Recommendations  None recommended by PT    Recommendations for Other Services       Precautions / Restrictions Precautions Precautions: Fall Restrictions Weight Bearing Restrictions: No      Mobility  Bed Mobility Overal bed mobility: Needs Assistance Bed Mobility: Supine to Sit     Supine to sit: Min assist     General bed mobility comments: Min A for patient to pull up on PT to come to EOB  Transfers Overall transfer level: Needs assistance Equipment used: Rolling walker (2 wheeled) Transfers: Sit to/from [facility] Sit to Stand: Min guard Stand pivot transfers: Min guard       General transfer comment: min guard for safety and immediate standing balance   Ambulation/Gait Ambulation/Gait assistance: Min guard Gait Distance (Feet): 150 Feet Assistive device: Rolling walker (2 wheeled) Gait Pattern/deviations: Step-through pattern;Decreased stride length;Trunk flexed Gait velocity: decreased   General Gait  Details: required use of UE on RW - declines further mobility due to general fatigue; slow and steady pace of gait  Stairs            Wheelchair Mobility    Modified Rankin (Stroke Patients Only)       Balance Overall balance assessment: Mild deficits observed, not formally tested                                           Pertinent Vitals/Pain Pain Assessment: No/denies pain    Home Living Family/patient expects to be discharged to:: Private residence Living Arrangements: Children Available Help at Discharge: Family;Available PRN/intermittently Type of Home: House Home Access: Level entry     Home Layout: One level Home Equipment: Shower seat;Bedside commode;Walker - 2 wheels;Cane - single point;Wheelchair - manual      Prior Function Level of Independence: Independent with assistive device(s)         Comments: SPC for short community distances, RW for inside use, w/c for [place] distances; light assist for lower body ADLs     Hand Dominance   Dominant Hand: Left    Extremity/Trunk Assessment   Upper Extremity Assessment Upper Extremity Assessment: Defer to OT evaluation    Lower Extremity Assessment Lower Extremity Assessment: Generalized weakness    Cervical / Trunk Assessment Cervical / Trunk Assessment: Normal  Communication   Communication: No difficulties  Cognition Arousal/Alertness: Awake/alert Behavior During Therapy: WFL for tasks assessed/performed Overall Cognitive Status: Within Functional Limits for tasks assessed  General Comments General comments (skin integrity, edema, etc.): very motivated    Exercises     Assessment/Plan    PT Assessment Patient needs continued PT services  PT Problem List Decreased strength;Decreased activity tolerance;Decreased balance;Decreased mobility;Decreased knowledge of use of DME;Decreased safety awareness        PT Treatment Interventions DME instruction;Gait training;Functional mobility training;Therapeutic activities;Therapeutic exercise;Patient/family education    PT Goals (Current goals can be found in the Care Plan section)  Acute Rehab PT Goals Patient Stated Goal: regain mobility PT Goal Formulation: With patient Time For Goal Achievement: 02/28/18 Potential to Achieve Goals: Good    Frequency Min 3X/week   Barriers to discharge        Co-evaluation               AM-PAC PT "6 Clicks" Mobility  Outcome Measure Help needed turning from your back to your side while in a flat bed without using bedrails?: A Little Help needed moving from lying on your back to sitting on the side of a flat bed without using bedrails?: A Little Help needed moving to and from a bed to a chair (including a wheelchair)?: A Little Help needed standing up from a chair using your arms (e.g., wheelchair or bedside chair)?: A Little Help needed to walk in hospital room?: A Little Help needed climbing 3-5 steps with a railing? : A Lot 6 Click Score: 17    End of Session Equipment Utilized During Treatment: Gait belt Activity Tolerance: Patient tolerated treatment well Patient left: in chair;with call bell/phone within reach;with nursing/sitter in room Nurse Communication: Mobility status PT Visit Diagnosis: Unsteadiness on feet (R26.81);Other abnormalities of gait and mobility (R26.89)    Time: 4917-9150 PT Time Calculation (min) (ACUTE ONLY): 26 min   Charges:   PT Evaluation $PT Eval Moderate Complexity: 1 Mod         Lanney Gins, PT, DPT Supplemental Physical Therapist 02/14/18 9:45 AM Pager: [phone] Office: [phone]

## 2018-02-14 NOTE — Progress Notes (Signed)
Pt continues to be SOB when getting to [facility] while on 2L.  Will attempt purewick.

## 2018-02-14 NOTE — H&P (Signed)
History and Physical    Crystal Kerr MBW:[id number] DOB: 08/14/1924 DOA: 02/13/2018  PCP: Crystal Mclean, MD  Patient coming from: Home.  Chief Complaint: Shortness of breath and weakness.  HPI: Crystal Kerr is a 85 y.o. female with history of atrial fibrillation, asthma/COPD, CHF has been experiencing cough shortness of breath over the last 3 to 4 days.  At the same time patient also has been experiencing some weakness difficult to walk because of the weakness.  Denies any fall or loss of consciousness.  Denies any chest pain.  Patient has been having productive sputum with shortness of breath and wheezing.  ED Course: In the ER patient was found to be wheezing chest x-ray was unremarkable patient was afebrile.  Labs revealed chronic anemia with leukocytosis of 19,000.  Patient was given steroids nebulizer treatment and admitted for acute bronchitis with weakness.  Review of Systems: As per HPI, rest all negative.   Past Medical History:  Diagnosis Date  . Arthritis   . Asthma   . Atrial fibrillation ([place])    a. s/p DCCV in 2015, on Amiodarone and Eliquis  . CHF (congestive heart failure) ([place])   . COPD (chronic obstructive pulmonary disease) ([place])   . Dysrhythmia    afib -hx   . Neuromuscular disorder ([place])    peripheral neuropathy   . Pneumonia    hx of x 2   . PONV (postoperative nausea and vomiting)   . PVC's (premature ventricular contractions)     Past Surgical History:  Procedure Laterality Date  . APPENDECTOMY    . CARDIOVERSION N/A 03/08/2013   Procedure: CARDIOVERSION;  Surgeon: Larey Dresser, MD;  Location: [facility];  Service: Cardiovascular;  Laterality: N/A;  . CHOLECYSTECTOMY    . oophrectomy    . TEE WITHOUT CARDIOVERSION N/A 03/08/2013   Procedure: TRANSESOPHAGEAL ECHOCARDIOGRAM (TEE);  Surgeon: Larey Dresser, MD;  Location: [facility];  Service: Cardiovascular;  Laterality: N/A;  . TONSILLECTOMY    . TRANSURETHRAL  RESECTION OF BLADDER TUMOR WITH GYRUS (TURBT-GYRUS)  05/13/2016   urology wiht [facility]  . TRANSURETHRAL RESECTION OF BLADDER TUMOR WITH MITOMYCIN-C N/A 12/24/2017   Procedure: TRANSURETHRAL RESECTION OF BLADDER TUMOR;  Surgeon: Lucas Mallow, MD;  Location: [facility];  Service: Urology;  Laterality: N/A;     reports that she quit smoking about 36 years ago. Her smoking use included cigarettes. She has a 20.00 pack-year smoking history. She has never used smokeless tobacco. She reports current alcohol use. She reports that she does not use drugs.  No Known Allergies  Family History  Problem Relation Age of Onset  . Heart failure Mother   . Diabetes Father   . Heart disease Other        No family history    Prior to Admission medications   Medication Sig Start Date End Date Taking? Authorizing Provider  acetaminophen (TYLENOL) 325 MG tablet Take 2 tablets (650 mg total) by mouth every 6 (six) hours as needed for mild pain or headache. 06/05/16   Raiford Noble Latif, DO  albuterol [facility] HFA) 108 (90 Base) MCG/ACT inhaler Inhale 2 puffs into the lungs every 4 (four) hours as needed for wheezing or shortness of breath. 08/18/17   Shelda Pal, DO  budesonide-formoterol (SYMBICORT) 160-4.5 MCG/ACT inhaler Inhale 2 puffs into the lungs 2 (two) times daily. 12/29/17   Copland, Gay Filler, MD  diltiazem (TIAZAC) 120 MG 24 hr capsule TAKE 1 CAPSULE BY MOUTH DAILY  Patient taking differently: Take 120 mg by mouth daily.  05/13/17   Crenshaw, Denice Bors, MD  ELIQUIS 5 MG TABS tablet TAKE 1 TABLET(5 MG) BY MOUTH TWICE DAILY Patient taking differently: Take 5 mg by mouth daily.  08/30/17   Lelon Perla, MD  famotidine (PEPCID) 20 MG tablet Take 1 tablet (20 mg total) by mouth at bedtime. 01/21/18   Copland, Gay Filler, MD  fluticasone (FLONASE) 50 MCG/ACT nasal spray Place 1 spray into both nostrils daily. 04/22/17   Chesley Mires, MD  fluticasone (FLOVENT HFA) 110 MCG/ACT inhaler Inhale 2 puffs into  the lungs 2 (two) times daily.    [provider]  furosemide (LASIX) 20 MG tablet Take 1 tablet (20 mg total) by mouth daily. Use as needed for swelling 08/16/17   Copland, Gay Filler, MD  mirabegron ER (MYRBETRIQ) 25 MG TB24 tablet Take 1 tablet (25 mg total) by mouth daily. Use for bladder symptoms 07/07/17   Copland, Gay Filler, MD  montelukast (SINGULAIR) 10 MG tablet TAKE 1 TABLET BY MOUTH DAILY 01/03/18   Copland, Gay Filler, MD  Multiple Vitamins-Minerals (MULTIVITAMIN WITH MINERALS) tablet Take 1 tablet by mouth daily.    [provider]  pantoprazole (PROTONIX) 40 MG tablet Take 1 tablet (40 mg total) by mouth daily before breakfast. 09/18/16   Copland, Gay Filler, MD  traMADol (ULTRAM) 50 MG tablet TAKE 1/2 TO 1 TABLET BY MOUTH EVERY 8 HOURS AS NEEDED FOR HIP PAIN Patient taking differently: Take 50 mg by mouth every 8 (eight) hours as needed for moderate pain. TAKE 1/2 TO 1 TABLET BY MOUTH EVERY 8 HOURS AS NEEDED FOR HIP PAIN 10/22/17   Copland, Gay Filler, MD  triamcinolone cream (KENALOG) 0.1 % Apply 1 application topically 2 (two) times daily. Apply to rash on side as needed 12/25/15   Crystal Mclean, MD    Physical Exam: Vitals:   02/13/18 2055 02/13/18 2100 02/13/18 2152 02/13/18 2320  BP: (!) 150/60 (!) 150/52 (!) 167/57 (!) 148/60  Pulse: 77 85 85 80  Resp: (!) 25 (!) 24 (!) 24 (!) 22  Temp:    98.4 F (36.9 C)  TempSrc:    Oral  SpO2: 96% 97% 97% 97%  Weight:      Height:          Constitutional: Moderately built and nourished. Vitals:   02/13/18 2055 02/13/18 2100 02/13/18 2152 02/13/18 2320  BP: (!) 150/60 (!) 150/52 (!) 167/57 (!) 148/60  Pulse: 77 85 85 80  Resp: (!) 25 (!) 24 (!) 24 (!) 22  Temp:    98.4 F (36.9 C)  TempSrc:    Oral  SpO2: 96% 97% 97% 97%  Weight:      Height:       Eyes: Anicteric no pallor. ENMT: No discharge from the ears eyes nose and mouth. Neck: No mass felt.  No neck rigidity.  No JVD appreciated. Respiratory:  Mild expiratory wheeze or coarse crepitations. Cardiovascular: S1-S2 heard. Abdomen: Soft nontender bowel sounds present. Musculoskeletal: No edema.  No joint effusion. Skin: No rash. Neurologic: Alert awake oriented to time place and person.  Moves all extremities. Psychiatric: Appears normal.  Normal affect.   Labs on Admission: I have personally reviewed following labs and imaging studies  CBC: Recent Labs  Lab 02/13/18 1522  WBC 19.8*  NEUTROABS 16.1*  HGB 9.4*  HCT 31.5*  MCV 76.3*  PLT 119   Basic Metabolic Panel: Recent Labs  Lab 02/13/18 1522  NA 131*  K 3.6  CL 99  CO2 24  GLUCOSE 94  BUN 33*  CREATININE 1.21*  CALCIUM 8.7*   GFR: Estimated Creatinine Clearance: 30.2 mL/min (A) (by C-G formula based on SCr of 1.21 mg/dL (H)). Liver Function Tests: No results for input(s): AST, ALT, ALKPHOS, BILITOT, PROT, ALBUMIN in the last 168 hours. No results for input(s): LIPASE, AMYLASE in the last 168 hours. No results for input(s): AMMONIA in the last 168 hours. Coagulation Profile: No results for input(s): INR, PROTIME in the last 168 hours. Cardiac Enzymes: Recent Labs  Lab 02/13/18 1522  TROPONINI <0.03   BNP (last 3 results) No results for input(s): PROBNP in the last 8760 hours. HbA1C: No results for input(s): HGBA1C in the last 72 hours. CBG: No results for input(s): GLUCAP in the last 168 hours. Lipid Profile: No results for input(s): CHOL, HDL, LDLCALC, TRIG, CHOLHDL, LDLDIRECT in the last 72 hours. Thyroid Function Tests: No results for input(s): TSH, T4TOTAL, FREET4, T3FREE, THYROIDAB in the last 72 hours. Anemia Panel: No results for input(s): VITAMINB12, FOLATE, FERRITIN, TIBC, IRON, RETICCTPCT in the last 72 hours. Urine analysis:    Component Value Date/Time   COLORURINE YELLOW 07/03/2017 [place] 07/03/2017 1246   LABSPEC 1.015 07/03/2017 1246   PHURINE 6.0 07/03/2017 1246   GLUCOSEU NEGATIVE 07/03/2017 1246   HGBUR  NEGATIVE 07/03/2017 1246   BILIRUBINUR negative 12/14/2017 1443   BILIRUBINUR neg 01/02/2014 1734   KETONESUR negative 12/14/2017 1443   KETONESUR NEGATIVE 07/03/2017 1246   PROTEINUR =30 (A) 12/14/2017 1443   PROTEINUR NEGATIVE 07/03/2017 1246   UROBILINOGEN 0.2 12/14/2017 1443   UROBILINOGEN 0.2 03/04/2013 1248   NITRITE Negative 12/14/2017 1443   NITRITE NEGATIVE 07/03/2017 1246   LEUKOCYTESUR Negative 12/14/2017 1443   Sepsis Labs: @LABRCNTIP (procalcitonin:4,lacticidven:4) )No results found for this or any previous visit (from the past 240 hour(s)).   Radiological Exams on Admission: Dg Chest 2 View  Result Date: 02/13/2018 CLINICAL DATA:  Fatigue and shortness of breath. EXAM: CHEST - 2 VIEW COMPARISON:  01/27/2018 FINDINGS: Lungs are hyperexpanded Interstitial markings are diffusely coarsened with chronic features. Cardiopericardial silhouette is at upper limits of normal for size. The visualized bony structures of the thorax are intact. Telemetry leads overlie the chest. IMPRESSION: Emphysema without acute cardiopulmonary findings. Electronically Signed   By: Misty Stanley M.D.   On: 02/13/2018 15:50    EKG: Independently reviewed.  Normal sinus rhythm.  Low voltage.  Assessment/Plan Principal Problem:   Acute respiratory failure with hypoxia (HCC) Active Problems:   Persistent atrial fibrillation   Chronic diastolic heart failure (HCC)   ILD (interstitial lung disease) (HCC)   Acute bronchitis    1. Acute respiratory failure with hypoxia likely from acute bronchitis for which patient has been placed on IV steroids Xopenex nebulizer and Pulmicort.  Since patient has significant leukocytosis with productive sputum I have placed patient on doxycycline will get sputum cultures per influenza PCR was negative. 2. Patient A. fib on Cardizem and Eliquis.  Rate controlled. 3. Chronic diastolic CHF last EF measured in 2015 was 55 to 60% appears compensated BNP is normal.  Takes  Lasix as needed. 4. Generalized weakness likely from viral syndrome will get PT consult. 5. History of interstitial lung disease per the chart.   DVT prophylaxis: Apixaban. Code Status: DNR. Family Communication: Patient's daughter. Disposition Plan: Home. Consults called: Physical therapy. Admission status: Observation.   Rise Patience MD [facility] Pager [phone].  If 7PM-7AM, please contact night-coverage [email or web address] Password [email or web address]  02/14/2018, 12:18 AM

## 2018-02-14 NOTE — Progress Notes (Addendum)
Patient seen and examined   85 y.o. female with history of atrial fibrillation, asthma/COPD /history of amiodarone pulmonary toxicity, bronchiectasis /ILD ,, CHF (Echo 2015 EF 65%, Gr 2 DD , Mod Dilated RA)  has been experiencing cough shortness of breath over the last 3 to 4 days.  At the same time patient also has been experiencing some weakness difficult to walk because of the weakness. Patient has been having productive sputum with shortness of breath and wheezing.  ED Course: In the ER patient was found to be wheezing chest x-ray was unremarkable patient was afebrile.  Labs revealed chronic anemia with leukocytosis of 19,000.  Patient was given steroids nebulizer treatment and admitted for acute bronchitis with weakness.  Assessment and plan 1. Acute respiratory failure with hypoxia likely from acute bronchitis /ILD flair for which patient has been placed on IV steroids Xopenex nebulizer and Pulmicort.  Since patient has significant leukocytosis with productive sputum I have placed patient on doxycycline will get sputum cultures per influenza PCR was negative.she is already anticoagulated therefore doubt PE,but  given her non IPF findings on CT on 08/17/17 a repeat CT scan in 12 months was recommended . Will obtain 2-D echo to rule out pulmonary hypertension/valvular heart disease given long-standing history of pulmonary issues/ILD. Patient still requiring 2 L of oxygen at rest, will do  ambulatory O2 assessment. Most likely she will need continued observation at least for the next 24 hours . CT scan does not show any PE or abnormality in the lung parenchyma 2. Patient A. fib on Cardizem and Eliquis.  Rate controlled. 3. Chronic diastolic CHF last EF measured in 2015 was 55 to 60% appears compensated BNP is normal.  Takes Lasix as needed. 4. Generalized weakness likely from viral syndrome will get PT consult. 5. History of interstitial lung disease per the chart.

## 2018-02-14 NOTE — Progress Notes (Signed)
  Echocardiogram 2D Echocardiogram has been performed.  Darlina Sicilian M 02/14/2018, 2:33 PM

## 2018-02-14 NOTE — Progress Notes (Signed)
Pt arrived to unit via stretcher with daughter, room 1503. Alert and oriented x4. 0/10 pain. General weakness and NP cough. VS taken. Pt oriented to room and callbell with no complications. Initial assessment completed. Glasses and bilateral hearing aids at bedside. Will continue to monitor

## 2018-02-15 DIAGNOSIS — J204 Acute bronchitis due to parainfluenza virus: Secondary | ICD-10-CM

## 2018-02-15 DIAGNOSIS — J200 Acute bronchitis due to Mycoplasma pneumoniae: Secondary | ICD-10-CM

## 2018-02-15 LAB — COMPREHENSIVE METABOLIC PANEL
ALT: 22 U/L (ref 0–44)
AST: 34 U/L (ref 15–41)
Albumin: 3 g/dL — ABNORMAL LOW (ref 3.5–5.0)
Alkaline Phosphatase: 87 U/L (ref 38–126)
Anion gap: 8 (ref 5–15)
BUN: 27 mg/dL — ABNORMAL HIGH (ref 8–23)
CO2: 23 mmol/L (ref 22–32)
Calcium: 8.6 mg/dL — ABNORMAL LOW (ref 8.9–10.3)
Chloride: 108 mmol/L (ref 98–111)
Creatinine, Ser: 0.95 mg/dL (ref 0.44–1.00)
GFR calc Af Amer: >60 mL/min (ref >60)
GFR calc non Af Amer: 52 mL/min — ABNORMAL LOW (ref >60)
Glucose, Bld: 117 mg/dL — ABNORMAL HIGH (ref 70–99)
Potassium: 3.6 mmol/L (ref 3.5–5.1)
Sodium: 139 mmol/L (ref 135–145)
Total Bilirubin: 0.4 mg/dL (ref 0.3–1.2)
Total Protein: 6.4 g/dL — ABNORMAL LOW (ref 6.5–8.1)

## 2018-02-15 MED ORDER — ALBUTEROL SULFATE HFA 108 (90 BASE) MCG/ACT IN AERS: 2.0000 | INHALATION_SPRAY | RESPIRATORY_TRACT | 11 refills | Status: AC | PRN

## 2018-02-15 MED ORDER — GUAIFENESIN-DM 100-10 MG/5ML PO SYRP
5.0000 mL | ORAL_SOLUTION | ORAL | Status: DC | PRN
  Administered 2018-02-15 (×2): 5 mL via ORAL
  Filled 2018-02-15 (×2): qty 10

## 2018-02-15 MED ORDER — GUAIFENESIN ER 600 MG PO TB12: 600.0000 mg | ORAL_TABLET | Freq: Two times a day (BID) | ORAL | 0 refills | Status: DC | PRN

## 2018-02-15 MED ORDER — BUDESONIDE-FORMOTEROL FUMARATE 160-4.5 MCG/ACT IN AERO: 2.0000 | INHALATION_SPRAY | Freq: Two times a day (BID) | RESPIRATORY_TRACT | 4 refills | Status: DC

## 2018-02-15 MED ORDER — FLUTICASONE PROPIONATE 50 MCG/ACT NA SUSP: 1.0000 | Freq: Every day | NASAL | 0 refills | Status: DC | PRN

## 2018-02-15 MED ORDER — DOXYCYCLINE HYCLATE 50 MG PO CAPS: 100.0000 mg | ORAL_CAPSULE | Freq: Two times a day (BID) | ORAL | 0 refills | Status: AC

## 2018-02-15 MED ORDER — DOXYCYCLINE HYCLATE 100 MG PO TABS: 100.0000 mg | ORAL_TABLET | Freq: Two times a day (BID) | ORAL | Status: DC

## 2018-02-15 MED ORDER — PREDNISONE 20 MG PO TABS: ORAL_TABLET | ORAL | 0 refills | Status: AC

## 2018-02-15 MED ORDER — BENZONATATE 100 MG PO CAPS
200.0000 mg | ORAL_CAPSULE | Freq: Two times a day (BID) | ORAL | Status: DC | PRN
  Administered 2018-02-15: 200 mg via ORAL
  Filled 2018-02-15: qty 2

## 2018-02-15 MED ORDER — DOXYCYCLINE HYCLATE 100 MG PO TABS
100.0000 mg | ORAL_TABLET | Freq: Two times a day (BID) | ORAL | Status: DC
  Administered 2018-02-15: 100 mg via ORAL
  Filled 2018-02-15: qty 1

## 2018-02-15 NOTE — Discharge Summary (Addendum)
Physician Discharge Summary  JIMMYE WISNIESKI MRN: [id number] DOB/AGE: [birth year] 83 y.o.  PCP: [name], MD   Admit date: 02/13/2018 Discharge date: 02/15/2018  Discharge Diagnoses:    Principal Problem:   Acute respiratory failure with hypoxia [facility]) Active Problems:   Persistent atrial fibrillation   Chronic diastolic heart failure ([place])   ILD (interstitial lung disease) ([place])   Acute bronchitis    Follow-up recommendations Follow-up with PCP in 3-5 days , including all  additional recommended appointments as below Follow-up CBC, CMP in 3-5 days Please follow up on the final results of expectorated sputum      Allergies as of 02/15/2018   No Known Allergies     Medication List    STOP taking these medications   famotidine 20 MG tablet Commonly known as:  PEPCID     TAKE these medications   acetaminophen 325 MG tablet Commonly known as:  TYLENOL Take 2 tablets (650 mg total) by mouth every 6 (six) hours as needed for mild pain or headache.   albuterol 108 (90 Base) MCG/ACT inhaler Commonly known as:  PROAIR HFA Inhale 2 puffs into the lungs every 4 (four) hours as needed for wheezing or shortness of breath.   budesonide-formoterol 160-4.5 MCG/ACT inhaler Commonly known as:  SYMBICORT Inhale 2 puffs into the lungs 2 (two) times daily.   diltiazem 120 MG 24 hr capsule Commonly known as:  TIAZAC TAKE 1 CAPSULE BY MOUTH DAILY   doxycycline 50 MG capsule Commonly known as:  VIBRAMYCIN Take 2 capsules (100 mg total) by mouth 2 (two) times daily for 5 days.   ELIQUIS 5 MG Tabs tablet Generic drug:  apixaban TAKE 1 TABLET(5 MG) BY MOUTH TWICE DAILY What changed:  See the new instructions.   fluticasone 50 MCG/ACT nasal spray Commonly known as:  FLONASE Place 1 spray into both nostrils daily as needed for allergies.   furosemide 20 MG tablet Commonly known as:  LASIX Take 1 tablet (20 mg total) by mouth daily. Use as needed for  swelling What changed:    when to take this  reasons to take this   guaiFENesin 600 MG 12 hr tablet Commonly known as:  MUCINEX Take 1 tablet (600 mg total) by mouth 2 (two) times daily as needed for cough.   mirabegron ER 25 MG Tb24 tablet Commonly known as:  MYRBETRIQ Take 1 tablet (25 mg total) by mouth daily. Use for bladder symptoms   montelukast 10 MG tablet Commonly known as:  SINGULAIR TAKE 1 TABLET BY MOUTH DAILY   multivitamin with minerals tablet Take 1 tablet by mouth daily.   pantoprazole 40 MG tablet Commonly known as:  PROTONIX Take 1 tablet (40 mg total) by mouth daily before breakfast.   polyvinyl alcohol 1.4 % ophthalmic solution Commonly known as:  LIQUIFILM TEARS Place 1 drop into both eyes 2 (two) times daily as needed for dry eyes.   predniSONE 20 MG tablet Commonly known as:  DELTASONE 20 mg daily   traMADol 50 MG tablet Commonly known as:  ULTRAM TAKE 1/2 TO 1 TABLET BY MOUTH EVERY 8 HOURS AS NEEDED FOR HIP PAIN What changed:  See the new instructions.   triamcinolone cream 0.1 % Commonly known as:  KENALOG Apply 1 application topically 2 (two) times daily. Apply to rash on side as needed        Discharge Condition:  stable  Discharge Instructions Get Medicines reviewed and adjusted: Please take all your medications with you  for your next visit with your Primary MD  Please request your Primary MD to go over all hospital tests and procedure/radiological results at the follow up, please ask your Primary MD to get all Hospital records sent to his/her office.  If you experience worsening of your admission symptoms, develop shortness of breath, life threatening emergency, suicidal or homicidal thoughts you must seek medical attention immediately by calling 911 or calling your MD immediately  if symptoms less severe.  You must read complete instructions/literature along with all the possible adverse reactions/side effects for all the  Medicines you take and that have been prescribed to you. Take any new Medicines after you have completely understood and accpet all the possible adverse reactions/side effects.   Do not drive when taking Pain medications.   Do not take more than prescribed Pain, Sleep and Anxiety Medications  Special Instructions: If you have smoked or chewed Tobacco  in the last 2 yrs please stop smoking, stop any regular Alcohol  and or any Recreational drug use.  Wear Seat belts while driving.  Please note  You were cared for by a hospitalist during your hospital stay. Once you are discharged, your primary care physician will handle any further medical issues. Please note that NO REFILLS for any discharge medications will be authorized once you are discharged, as it is imperative that you return to your primary care physician (or establish a relationship with a primary care physician if you do not have one) for your aftercare needs so that they can reassess your need for medications and monitor your lab values.     No Known Allergies    Disposition: Discharge disposition: 01-Home or Self Care        Consults:  none    Significant Diagnostic Studies:  Dg Chest 2 View  Result Date: 02/13/2018 CLINICAL DATA:  Fatigue and shortness of breath. EXAM: CHEST - 2 VIEW COMPARISON:  01/27/2018 FINDINGS: Lungs are hyperexpanded Interstitial markings are diffusely coarsened with chronic features. Cardiopericardial silhouette is at upper limits of normal for size. The visualized bony structures of the thorax are intact. Telemetry leads overlie the chest. IMPRESSION: Emphysema without acute cardiopulmonary findings. Electronically Signed   By: Misty [place] M.D.   On: 02/13/2018 15:50   Dg Chest 2 View  Result Date: 01/27/2018 CLINICAL DATA:  Fatigue and shortness of breath since 01/22/2017. EXAM: CHEST - 2 VIEW COMPARISON:  PA and lateral chest 01/25/2018 and 01/18/2018. CT chest 03/04/2017. FINDINGS:  Prominence of the pulmonary interstitium appears unchanged. No consolidative process, pneumothorax or effusion. Heart size is normal. Aortic atherosclerosis is noted. No acute or focal bony abnormality. IMPRESSION: No acute disease. No change in prominence of the pulmonary interstitium Atherosclerosis. Electronically Signed   By: Inge Rise M.D.   On: 01/27/2018 13:25   Dg Chest 2 View  Result Date: 01/26/2018 CLINICAL DATA:  Shortness of breath on exertion. Bladder cancer. Interstitial lung disease. EXAM: CHEST - 2 VIEW COMPARISON:  01/18/2018 and CT chest 08/17/2017. FINDINGS: Trachea is midline. Heart size stable. Thoracic aorta is calcified. Mild interstitial coarsening, better seen on 08/17/2017. Prominent epicardial fat. Calcified granuloma in the left upper lobe. Lungs are otherwise clear. No pleural fluid. IMPRESSION: 1. No acute findings. 2. Interstitial lung disease is better visualized and characterized on 08/17/2017. 3.  Aortic atherosclerosis (ICD10-170.0). Electronically Signed   By: Lorin Picket M.D.   On: 01/26/2018 08:25   Dg Chest 2 View  Result Date: 01/18/2018 CLINICAL DATA:  Shortness of  breath. EXAM: CHEST - 2 VIEW COMPARISON:  11/09/2017. 03/03/2017.  CT 08/17/2017. FINDINGS: Mediastinum hilar structures normal. Stable cardiomegaly with normal pulmonary vascularity. Diffuse bilateral from interstitial prominence most consistent chronic interstitial lung disease. No interim change. No focal alveolar infiltrate. No pleural effusion or pneumothorax. Degenerative changes scoliosis thoracolumbar spine. IMPRESSION: Number changes of chronic interstitial lung disease again noted. Similar findings noted on prior exam. No focal alveolar infiltrate noted. 2.  Stable cardiomegaly. Electronically Signed   By: Marcello Moores  Register   On: 01/18/2018 11:20   Ct Angio Chest Pe W Or Wo Contrast  Result Date: 02/14/2018 CLINICAL DATA:  Shortness of breath. EXAM: CT ANGIOGRAPHY CHEST WITH CONTRAST  TECHNIQUE: Multidetector CT imaging of the chest was performed using the standard protocol during bolus administration of intravenous contrast. Multiplanar CT image reconstructions and MIPs were obtained to evaluate the vascular anatomy. CONTRAST:  126mL ISOVUE-370 IOPAMIDOL (ISOVUE-370) INJECTION 76% COMPARISON:  08/17/2017 FINDINGS: Cardiovascular: There is moderate cardiac enlargement. Aortic atherosclerosis. The main pulmonary artery appears patent. No saddle embolus. Lower lobe segmental pulmonary arteries are slightly obscured due to respiratory motion artifact. Within this limitation no lobar or segmental pulmonary artery filling defects identified to suggest a clinically significant acute pulmonary embolus. Mediastinum/Nodes: Normal appearance of the thyroid gland. The trachea appears patent and is midline. Normal appearance of the esophagus. No enlarged mediastinal or hilar adenopathy. No axillary or supraclavicular adenopathy. Lungs/Pleura: Mild dependent changes identified overlying the posterior right lung base. No airspace consolidation, atelectasis or pneumothorax. Scattered areas of ground-glass attenuation are identified bilaterally. Left upper lobe calcified granuloma noted. Upper Abdomen: There is a complicated cyst within the upper pole of the left kidney measuring 2.5 cm and 31 HU. On 12/20/17 this measured 1.4 cm and 22 HU. Short-term interval increase in size and attenuation suggest interval hemorrhage. Musculoskeletal: Scoliosis deformity involves the thoracic spine. Review of the MIP images confirms the above findings. IMPRESSION: 1. No evidence for acute pulmonary embolus. 2.  Aortic Atherosclerosis (ICD10-I70.0). 3. Increase in size and attenuation of right upper pole kidney cyst which may be complicated by interval hemorrhage compared with 12/20/2017. Suggest nonemergent follow-up imaging with without and with contrast CT or MRI of the kidneys. A CT may be more ideal for patients that have  difficulty with breath hold. This should be done after resolution of patient's current clinical situation and the patient is able to remain still and breath hold. Electronically Signed   By: Kerby Moors M.D.   On: 02/14/2018 11:16    echocardiogram   1. The left ventricle has hyperdynamic systolic function of >14%. The cavity size is normal. There is no left ventricular wall thickness. Echo evidence of impaired relaxation diastolic filling patterns.  2. Mildly dilated left atrial size.  3. Normal right atrial size.  4. Trivial pericardial effusion, as described above.  5. Normal tricuspid valve.  6. The aortic valve tricuspid. There is moderate thickening and moderate sclerosis of the aortic valve.  7. No atrial level shunt detected by color flow Doppler.     Filed Weights   02/13/18 1454  Weight: 79.1 kg     Microbiology: No results found for this or any previous visit (from the past 240 hour(s)).     Blood Culture    Component Value Date/Time   SDES BLOOD LEFT HAND 03/04/2013 1220   SPECREQUEST BOTTLES DRAWN AEROBIC ONLY 10CC 03/04/2013 1220   CULT  03/04/2013 1220    NO GROWTH 5 DAYS Performed at [facility]  REPTSTATUS 03/10/2013 FINAL 03/04/2013 1220      Labs: Results for orders placed or performed during the hospital encounter of 02/13/18 (from the past 48 hour(s))  Basic metabolic panel     Status: Abnormal   Collection Time: 02/13/18  3:22 PM  Result Value Ref Range   Sodium 131 (L) 135 - 145 mmol/L   Potassium 3.6 3.5 - 5.1 mmol/L   Chloride 99 98 - 111 mmol/L   CO2 24 22 - 32 mmol/L   Glucose, Bld 94 70 - 99 mg/dL   BUN 33 (H) 8 - 23 mg/dL   Creatinine, Ser 1.21 (H) 0.44 - 1.00 mg/dL   Calcium 8.7 (L) 8.9 - 10.3 mg/dL   GFR calc non Af Amer 39 (L) >60 mL/min   GFR calc Af Amer 45 (L) >60 mL/min   Anion gap 8 5 - 15    Comment: Performed at [facility], [address] [place], [place] [address]  CBC with Differential      Status: Abnormal   Collection Time: 02/13/18  3:22 PM  Result Value Ref Range   WBC 19.8 (H) 4.0 - 10.5 K/uL   RBC 4.13 3.87 - 5.11 MIL/uL   Hemoglobin 9.4 (L) 12.0 - 15.0 g/dL   HCT 31.5 (L) 36.0 - 46.0 %   MCV 76.3 (L) 80.0 - 100.0 fL   MCH 22.8 (L) 26.0 - 34.0 pg   MCHC 29.8 (L) 30.0 - 36.0 g/dL   RDW 19.5 (H) 11.5 - 15.5 %   Platelets 281 150 - 400 K/uL   nRBC 0.0 0.0 - 0.2 %   Neutrophils Relative % 81 %   Neutro Abs 16.1 (H) 1.7 - 7.7 K/uL   Lymphocytes Relative 10 %   Lymphs Abs 1.9 0.7 - 4.0 K/uL   Monocytes Relative 7 %   Monocytes Absolute 1.5 (H) 0.1 - 1.0 K/uL   Eosinophils Relative 1 %   Eosinophils Absolute 0.2 0.0 - 0.5 K/uL   Basophils Relative 0 %   Basophils Absolute 0.0 0.0 - 0.1 K/uL   Immature Granulocytes 1 %   Abs Immature Granulocytes 0.18 (H) 0.00 - 0.07 K/uL    Comment: Performed at [facility], [address] [place], [place] [address]  Brain natriuretic peptide     Status: None   Collection Time: 02/13/18  3:22 PM  Result Value Ref Range   B Natriuretic Peptide 74.2 0.0 - 100.0 pg/mL    Comment: Performed at [facility], [address] [place], [place] [address]  Troponin I - Once     Status: None   Collection Time: 02/13/18  3:22 PM  Result Value Ref Range   Troponin I <0.03 <0.03 ng/mL    Comment: Performed at [facility], [address] [place], [place] [address]  Influenza panel by PCR (type A & B)     Status: None   Collection Time: 02/13/18  3:33 PM  Result Value Ref Range   Influenza A By PCR NEGATIVE NEGATIVE   Influenza B By PCR NEGATIVE NEGATIVE    Comment: (NOTE) The Xpert Xpress Flu assay is intended as an aid in the diagnosis of  influenza and should not be used as a sole basis for treatment.  This  assay is FDA approved for nasopharyngeal swab specimens only. Nasal  washings and aspirates are unacceptable for Xpert Xpress Flu testing. Performed at [facility], [place]  [address]  [place], [place] [address]   Basic metabolic panel     Status: Abnormal   Collection Time: 02/14/18  5:38 AM  Result Value Ref Range   Sodium 137 135 - 145 mmol/L   Potassium 3.5 3.5 - 5.1 mmol/L   Chloride 105 98 - 111 mmol/L   CO2 24 22 - 32 mmol/L   Glucose, Bld 149 (H) 70 - 99 mg/dL   BUN 26 (H) 8 - 23 mg/dL   Creatinine, Ser 0.92 0.44 - 1.00 mg/dL   Calcium 8.6 (L) 8.9 - 10.3 mg/dL   GFR calc non Af Amer 54 (L) >60 mL/min   GFR calc Af Amer >60 >60 mL/min   Anion gap 8 5 - 15    Comment: Performed at [facility], [place] [address] [place], [place] [address]  CBC     Status: Abnormal   Collection Time: 02/14/18  5:38 AM  Result Value Ref Range   WBC 13.8 (H) 4.0 - 10.5 K/uL   RBC 3.86 (L) 3.87 - 5.11 MIL/uL   Hemoglobin 8.9 (L) 12.0 - 15.0 g/dL    Comment: Reticulocyte Hemoglobin testing may be clinically indicated, consider ordering this additional test [id number]    HCT 29.4 (L) 36.0 - 46.0 %   MCV 76.2 (L) 80.0 - 100.0 fL   MCH 23.1 (L) 26.0 - 34.0 pg   MCHC 30.3 30.0 - 36.0 g/dL   RDW 19.2 (H) 11.5 - 15.5 %   Platelets 272 150 - 400 K/uL   nRBC 0.0 0.0 - 0.2 %    Comment: Performed at [facility], [place] [address] [place], [place] [address]  Troponin I - Once     Status: None   Collection Time: 02/14/18  9:40 AM  Result Value Ref Range   Troponin I <0.03 <0.03 ng/mL    Comment: Performed at [facility], [place] [address], [place], [place] [address]  Comprehensive metabolic panel     Status: Abnormal   Collection Time: 02/15/18  6:09 AM  Result Value Ref Range   Sodium 139 135 - 145 mmol/L   Potassium 3.6 3.5 - 5.1 mmol/L   Chloride 108 98 - 111 mmol/L   CO2 23 22 - 32 mmol/L   Glucose, Bld 117 (H) 70 - 99 mg/dL   BUN 27 (H) 8 - 23 mg/dL   Creatinine, Ser 0.95 0.44 - 1.00 mg/dL   Calcium 8.6 (L) 8.9 - 10.3 mg/dL   Total Protein 6.4 (L) 6.5 - 8.1 g/dL   Albumin 3.0 (L) 3.5 - 5.0 g/dL   AST 34 15 - 41 U/L   ALT  22 0 - 44 U/L   Alkaline Phosphatase 87 38 - 126 U/L   Total Bilirubin 0.4 0.3 - 1.2 mg/dL   GFR calc non Af Amer 52 (L) >60 mL/min   GFR calc Af Amer >60 >60 mL/min   Anion gap 8 5 - 15    Comment: Performed at [facility], [place] [address], [place], [place] [address]     Lipid Panel     Component Value Date/Time   CHOL 228 (H) 04/22/2011 0000   TRIG 157 (H) 04/22/2011 0000   HDL 50 04/22/2011 0000   CHOLHDL 4.6 04/22/2011 0000   VLDL 31 04/22/2011 0000   LDLCALC 147 (H) 04/22/2011 0000     No results found for: HGBA1C   Lab Results  Component Value Date   LDLCALC 147 (H)  04/22/2011   CREATININE 0.95 02/15/2018    85y.o.femalewithhistory of atrial fibrillation, asthma/COPD /history of amiodarone pulmonary toxicity, bronchiectasis /ILD ,, CHF (Echo 2015 EF 65%, Gr 2 DD , Mod Dilated RA)  has been experiencing cough shortness of breath over the last 3 to 4 days. At the same time patient also has been experiencing some weakness difficult to walk because of the weakness. Patient has been having productive sputum with shortness of breath and wheezing.  ED Course:In the ER patient was found to be wheezing chest x-ray was unremarkable patient was afebrile. Labs revealed chronic anemia with leukocytosis of 19,000. Patient was given steroids nebulizer treatment and admitted for acute bronchitis with weakness.  Hospital course 1. Acute respiratory failure with hypoxia likely from acute bronchitis /ILD flair for which patient   placed on IV steroids Xopenex nebulizer and Pulmicort. Since patient has significant leukocytosis with productive sputum  She was also started  on doxycycline , still pending sputum cultures , negative influenza PCR  .she is already anticoagulated therefore doubt PE,but  given her non IPF findings on CT on 08/17/17 a repeat CT scan in 12 months was recommended . CT scan does not show any PE or abnormality in the lung parenchyma.  2-D echo   Performed to evaluate her shortness of breath showed a hyperdynamic systolic function with EF of greater than 65% no significant pulmonary hypertension/valvular heart disease given long-standing history of pulmonary issues/ILD. Patient initially required 2 L of oxygen at rest,   ambulatory O2 assessment.  Showed that o2 sao2 were between 95-97%   .Patient feels that her Pulmonary sx could be due to intravesical BCG that she is receiving for her bladder cancer.BCG can cause a hypersensitivity reaction, hence she will continue with prednisone for another 4-5 days . She will discuss with urology about alternative treatments. Patient was given the option to stay for another 24 hours. She felt that she would be much more comfortable at home and she has "made up her mind to be discharged home today' 2.  A. fib on Cardizem and Eliquis. Rate controlled. 3. Chronic diastolic CHF last EF measured in 2015 was 55 to 60% appears compensated BNP is normal. Takes Lasix as needed. 4. Generalized weakness likely from viral syndrome  .PT recommended home health 5. History of interstitial lung disease per the chart    Discharge Exam:  Blood pressure (!) 185/79, pulse 85, temperature 98 F (36.7 C), resp. rate (!) 24, height 5\' 3"  (1.6 m), weight 79.1 kg, SpO2 96 %.   Cardiovascular: S1-S2 heard. Abdomen: Soft nontender bowel sounds present. Musculoskeletal: No edema.  No joint effusion. Skin: No rash. Neurologic: Alert awake oriented to time place and person.  Moves all extremities. Psychiatric: Appears normal.  Normal affect.   Follow-up Information    Copland, Gay Filler, MD. Call.   Specialty:  Family Medicine Why:  Hospital follow-up in 3-5 days Contact information: [place] [address] [phone]        Lelon Perla, MD .   Specialty:  Cardiology Contact information: [address] [place] [address] [phone]           Signed: Reyne Dumas 02/15/2018, 8:59 AM      Time needed to  prepare  discharge, discussed with the patient and family 35 minutes

## 2018-02-15 NOTE — Progress Notes (Signed)
PHYSICAL THERAPY  SATURATION QUALIFICATIONS: (This note is used to comply with regulatory documentation for home oxygen)  Patient Saturations on Room Air at Rest = *95%  Patient Saturations on Room Air while Ambulating = 91%  Patient Saturations on          Liters of oxygen while Ambulating 75 feet              Please briefly explain why patient needs home oxygen:   Pt does not require supplemental oxygen   Rica Koyanagi  PTA Acute  Rehabilitation Services Pager      [phone] Office      [phone]

## 2018-02-15 NOTE — Progress Notes (Signed)
Physical Therapy Treatment Patient Details Name: SHONTELL PROSSER MRN: [id number] DOB: 03-30-1924 Today's Date: 02/15/2018    History of Present Illness Patient is a 85 y/o female presenting to the ED on 02/13/2018 with primary complaints of SOB and weakness. Patient with a PMH significant for atrial fibrillation, asthma/COPD, CHF. Admitted for Acute respiratory failure with hypoxia likely from acute bronchitis.    PT Comments    Assisted OOB to amb to bathroom.  Assisted with hygiene and don/doffing adult briefs.  Pt was incont urine.  Assisted with toilet transfer then amb in hallway. General Gait Details: assisted from bed to bathroom no AD short distance with MinGuard Assist for balance.  Amb in hallway with walker for increased light support.    Follow Up Recommendations  Home health PT;Supervision - Intermittent     Equipment Recommendations  None recommended by PT    Recommendations for Other Services       Precautions / Restrictions      Mobility  Bed Mobility               General bed mobility comments: OOB in recliner   Transfers Overall transfer level: Needs assistance Equipment used: Rolling walker (2 wheeled) Transfers: Sit to/from Stand;Stand Pivot Transfers Sit to Stand: Supervision Stand pivot transfers: Supervision       General transfer comment: one VC safety with turns   assisted with toilet transfer   Ambulation/Gait Ambulation/Gait assistance: Supervision Gait Distance (Feet): 125 Feet Assistive device: Rolling walker (2 wheeled) Gait Pattern/deviations: Step-through pattern;Decreased stride length;Trunk flexed Gait velocity: decreased   General Gait Details: assisted from bed to bathroom no AD short distance with MinGuard Assist for balance.  Amb in hallway with walker for increased light support.     Stairs             Wheelchair Mobility    Modified Rankin (Stroke Patients Only)       Balance                                            Cognition                                              Exercises      General Comments        Pertinent Vitals/Pain      Home Living                      Prior Function            PT Goals (current goals can now be found in the care plan section) Progress towards PT goals: Progressing toward goals    Frequency    Min 3X/week      PT Plan Current plan remains appropriate    Co-evaluation              AM-PAC PT "6 Clicks" Mobility   Outcome Measure  Help needed turning from your back to your side while in a flat bed without using bedrails?: A Little Help needed moving from lying on your back to sitting on the side of a flat bed without using bedrails?: A Little Help needed moving to and from a bed to a chair (including  a wheelchair)?: A Little Help needed standing up from a chair using your arms (e.g., wheelchair or bedside chair)?: A Little Help needed to walk in hospital room?: A Little Help needed climbing 3-5 steps with a railing? : A Little 6 Click Score: 18    End of Session Equipment Utilized During Treatment: Gait belt Activity Tolerance: Patient tolerated treatment well Patient left: in chair;with call bell/phone within reach;with nursing/sitter in room Nurse Communication: Mobility status PT Visit Diagnosis: Unsteadiness on feet (R26.81);Other abnormalities of gait and mobility (R26.89)     Time: 4373-5789 PT Time Calculation (min) (ACUTE ONLY): 25 min  Charges:  $Gait Training: 8-22 mins $Therapeutic Activity: 8-22 mins                     Rica Koyanagi  PTA Acute  Rehabilitation Services Pager      [phone] Office      [phone]

## 2018-02-15 NOTE — Care Management Note (Signed)
Case Management Note  Patient Details  Name: Crystal Kerr MRN: [id number] Date of Birth: 1924/08/19  Subjective/Objective:                  Discharge planning  Action/Plan: Home with hhc -pt,ot,and aide chooses [facility].  List on shadow chart.  Expected Discharge Date:  02/15/18               Expected Discharge Plan:  [place]  In-House Referral:     Discharge planning Services  CM Consult  Post Acute Care Choice:  Home Health Choice offered to:  Patient  DME Arranged:    DME Agency:     HH Arranged:  PT, OT, Nurse's Aide [place] Agency:  [facility]  Status of Service:  Completed, signed off  If discussed at [place] of Stay Meetings, dates discussed:    Additional Comments:  Leeroy Cha, RN 02/15/2018, 11:10 AM

## 2018-02-15 NOTE — Progress Notes (Signed)
PHARMACIST - PHYSICIAN COMMUNICATION DR:   Allyson Sabal CONCERNING: Antibiotic IV to Oral Route Change Policy  RECOMMENDATION: This patient is receiving doxycycline by the intravenous route.  Based on criteria approved by the Pharmacy and Therapeutics Committee, the antibiotic(s) is/are being converted to the equivalent oral dose form(s).   DESCRIPTION: These criteria include:  Patient being treated for a respiratory tract infection, urinary tract infection, cellulitis or clostridium difficile associated diarrhea if on metronidazole  The patient is not neutropenic and does not exhibit a GI malabsorption state  The patient is eating (either orally or via tube) and/or has been taking other orally administered medications for a least 24 hours  The patient is improving clinically and has a Tmax < 100.5  If you have questions about this conversion, please contact the Pharmacy Department  []   [phone] )  [facility] []   [phone] )  [facility] []   [phone] )  [facility] []   [phone] )  [facility] [x]   [phone] )  [place], PharmD, BCPS 02/15/2018 10:25 AM

## 2018-02-16 ENCOUNTER — Telehealth: Payer: Self-pay | Admitting: *Deleted

## 2018-02-16 NOTE — Progress Notes (Signed)
[place] [facility] [employer] [place], [place], [place] [address] [phone] [id number]  Date:  02/17/2018   Name:  Crystal Kerr   DOB:  December 06, 1924   MRN:  [id number]  PCP:  [name], MD    Chief Complaint: Hospitalization Follow-up (respiratory failure, no better, short of breath)   History of Present Illness:  Crystal Kerr is a 85 y.o. very pleasant female patient who presents with the following:  Elderly lady following up from hospital stay today She is accompanied by her daughter [name] who contributes to the history  She was admitted from February 2 to February 4 with acute respiratory failure-see summary below  Admit date: 02/13/2018 Discharge date: 02/15/2018 Principal Problem:   Acute respiratory failure with hypoxia [facility]) Active Problems:   Persistent atrial fibrillation   Chronic diastolic heart failure ([place])   ILD (interstitial lung disease) ([place])   Acute bronchitis  Follow-up recommendations Follow-up with PCP in 3-5 days , including all  additional recommended appointments as below Follow-up CBC, CMP in 3-5 days Please follow up on the final results of expectorated sputum  85y.o.femalewithhistory of atrial fibrillation, asthma/COPD/history of amiodarone pulmonary toxicity, bronchiectasis /ILD , CHF(Echo 2015 EF 65%, Gr 2 DD , Mod Dilated RA)has been experiencing cough shortness of breath over the last 3 to 4 days. At the same time patient also has been experiencing some weakness difficult to walk because of the weakness. Patient has been having productive sputum with shortness of breath and wheezing.  ED Course:In the ER patient was found to be wheezing chest x-ray was unremarkable patient was afebrile. Labs revealed chronic anemia with leukocytosis of 19,000. Patient was given steroids nebulizer treatment and admitted for acute bronchitis with weakness.  Hospital course 1. Acute  respiratory failure with hypoxia likely from acute bronchitis/ILD flairfor which patient   placed on IV steroids Xopenex nebulizer and Pulmicort. Since patient has significant leukocytosis with productive sputum  She was also started  on doxycycline, still pending sputum cultures , negative influenza PCR  .she is already anticoagulated therefore doubt PE,but given her non IPFfindings on CT on 08/17/17 a repeat CT scan in 12 months was recommended. CT scan does not show any PE or abnormality in the lung parenchyma.  2-D echo  Performed to evaluate her shortness of breath showed a hyperdynamic systolic function with EF of greater than 65% no significant pulmonary hypertension/valvular heart disease given long-standing history of pulmonary issues/ILD. Patient initially required 2 L of oxygen at rest, ambulatory O2 assessment.  Showed that o2 sao2 were between 95-97%   .Patient feels that her Pulmonary sx could be due to intravesical BCG that she is receiving for her bladder cancer.BCG can cause a hypersensitivity reaction, hence she will continue with prednisone for another 4-5 days . She will discuss with urology about alternative treatments. Patient was given the option to stay for another 24 hours. She felt that she would be much more comfortable at home and she has "made up her mind to be discharged home today' 2.  A. fib on Cardizem and Eliquis. Rate controlled. 3. Chronic diastolic CHF last EF measured in 2015 was 55 to 60% appears compensated BNP is normal. Takes Lasix as needed. 4. Generalized weakness likely from viral syndrome  .PT recommended home health 5. History of interstitial lung disease per the chart  She is established with pulmonology for her interstitial lung disease-she most frequently sees [name], nurse practitioner.  This is not discussed much in the discharge summary note, but Crystal Kerr is convinced that her bladder cancer infusions triggered her recent illness. She has done  2 infusions so far, and has gotten sick after both infusions.  She does have an ER note from last month on January 16, and she states this was also just a couple of days after the infusion. At this time Crystal Kerr does not wish to continue her bladder cancer infusions.  I counseled her that this is certainly her decision.  At 85 years old I do think her decision to stop treatment is reasonable.  She does plan to look into any other options with urology  She is using symbicort and also has a flovent inhaler that she is using -I advised her that Flovent is probably not adding anything, but if she feels it helps her it should not be harmful  She got a letter that symbicort will not be covered going forward; I will contact her pulmonology team about this, and see if we should switch her to something else or try to get a prior auth  She is having some issues with constipation They are using miralax and a stool softener as needed, some difficulty in striking a balance between too much and too little.  We discussed some strategies to manage her bowels  They are looking for some help at home; someone to help her get up in the am and make sure she is safe, and prepare a meal.  I advised them that this is not something that home health/nursing would do, and we discussed how to find a home aide  At this time Crystal Kerr feels that she is gradually getting back to baseline.  She is still taking prednisone and doxycycline  Albuterol as needed Symbicort Diltiazem 120 Eliquis 5 Lasix 20 Singulair myrbetriq Pantoprazole Doxycycline 50 twice daily for 5 days Prednisone 20 daily for 7 days Patient Active Problem List   Diagnosis Date Noted  . COPD exacerbation ([place]) 02/14/2018  . Acute respiratory failure with hypoxia ([place]) 02/13/2018  . Renal insufficiency 02/10/2018  . Acute bronchitis 01/18/2018  . Pre-operative clearance 12/22/2017  . Chronic diastolic heart failure ([place]) 11/23/2017  . ILD  (interstitial lung disease) ([place]) 11/23/2017  . CAP (community acquired pneumonia) 03/04/2017  . Current use of long term anticoagulation 07/31/2016  . Atypical pneumonia 06/02/2016  . History of atrial fibrillation 06/02/2016  . Lower extremity edema 12/19/2013  . 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 Benign paroxysmal positional vertigo 09/13/2013  . Upper airway cough syndrome 07/10/2013  . Allergic rhinitis 07/05/2013  . Persistent atrial fibrillation 03/30/2013  . Shortness of breath 02/06/2013  . Acute on chronic diastolic congestive heart failure ([place]) 02/04/2013  . Arthritis of spine 07/01/2012  . Intrinsic asthma 07/01/2012  . Dyspnea 04/12/2012  . PVC's (premature ventricular contractions) 04/24/2011  . Murmur, cardiac 04/24/2011    Past Medical History:  Diagnosis Date  . Arthritis   . Asthma   . Atrial fibrillation ([place])    a. s/p DCCV in 2015, on Amiodarone and Eliquis  . CHF (congestive heart failure) ([place])   . COPD (chronic obstructive pulmonary disease) ([place])   . Dysrhythmia    afib -hx   . Neuromuscular disorder ([place])    peripheral neuropathy   . Pneumonia    hx of x 2   . PONV (postoperative nausea and vomiting)   . PVC's (premature ventricular  contractions)     Past Surgical History:  Procedure Laterality Date  . APPENDECTOMY    . CARDIOVERSION N/A 03/08/2013   Procedure: CARDIOVERSION;  Surgeon: Larey Dresser, MD;  Location: [facility];  Service: Cardiovascular;  Laterality: N/A;  . CHOLECYSTECTOMY    . oophrectomy    . TEE WITHOUT CARDIOVERSION N/A 03/08/2013   Procedure: TRANSESOPHAGEAL ECHOCARDIOGRAM (TEE);  Surgeon: Larey Dresser, MD;  Location: [facility];  Service: Cardiovascular;  Laterality: N/A;  . TONSILLECTOMY    . TRANSURETHRAL RESECTION OF BLADDER TUMOR WITH GYRUS (TURBT-GYRUS)  05/13/2016   urology wiht [facility]  . TRANSURETHRAL RESECTION OF BLADDER TUMOR WITH  MITOMYCIN-C N/A 12/24/2017   Procedure: TRANSURETHRAL RESECTION OF BLADDER TUMOR;  Surgeon: [place] Mallow, MD;  Location: [facility];  Service: Urology;  Laterality: N/A;    Social History   Tobacco Use  . Smoking status: Former Smoker    Packs/day: 1.00    Years: 20.00    Pack years: 20.00    Types: Cigarettes    Last attempt to quit: 04/13/1981    Years since quitting: 36.8  . Smokeless tobacco: Never Used  Substance Use Topics  . Alcohol use: Yes    Comment: Occasional  . Drug use: No    Family History  Problem Relation Age of Onset  . Heart failure Mother   . Diabetes Father   . Heart disease Other        No family history    No Known Allergies  Medication list has been reviewed and updated.  Current Outpatient Medications on File Prior to Visit  Medication Sig Dispense Refill  . acetaminophen (TYLENOL) 325 MG tablet Take 2 tablets (650 mg total) by mouth every 6 (six) hours as needed for mild pain or headache. 30 tablet 0  . albuterol (PROAIR HFA) 108 (90 Base) MCG/ACT inhaler Inhale 2 puffs into the lungs every 4 (four) hours as needed for wheezing or shortness of breath. 18 g 11  . budesonide-formoterol (SYMBICORT) 160-4.5 MCG/ACT inhaler Inhale 2 puffs into the lungs 2 (two) times daily. 10.2 Inhaler 4  . diltiazem (TIAZAC) 120 MG 24 hr capsule TAKE 1 CAPSULE BY MOUTH DAILY (Patient taking differently: Take 120 mg by mouth daily. ) 90 capsule 3  . doxycycline (VIBRAMYCIN) 50 MG capsule Take 2 capsules (100 mg total) by mouth 2 (two) times daily for 5 days. 20 capsule 0  . ELIQUIS 5 MG TABS tablet TAKE 1 TABLET(5 MG) BY MOUTH TWICE DAILY (Patient taking differently: Take 5 mg by mouth 2 (two) times daily. ) 180 tablet 1  . fluticasone (FLONASE) 50 MCG/ACT nasal spray Place 1 spray into both nostrils daily as needed for allergies. 16 g 0  . furosemide (LASIX) 20 MG tablet Take 1 tablet (20 mg total) by mouth daily. Use as needed for swelling (Patient taking differently:  Take 20 mg by mouth daily as needed for fluid. Use as needed for swelling) 90 tablet 3  . guaiFENesin (MUCINEX) 600 MG 12 hr tablet Take 1 tablet (600 mg total) by mouth 2 (two) times daily as needed for cough. 20 tablet 0  . mirabegron ER (MYRBETRIQ) 25 MG TB24 tablet Take 1 tablet (25 mg total) by mouth daily. Use for bladder symptoms 30 tablet 6  . montelukast (SINGULAIR) 10 MG tablet TAKE 1 TABLET BY MOUTH DAILY 90 tablet 0  . Multiple Vitamins-Minerals (MULTIVITAMIN WITH MINERALS) tablet Take 1 tablet by mouth daily.    . pantoprazole (PROTONIX) 40  MG tablet Take 1 tablet (40 mg total) by mouth daily before breakfast. 90 tablet 3  . predniSONE (DELTASONE) 20 MG tablet 20 mg daily 7 tablet 0  . traMADol (ULTRAM) 50 MG tablet TAKE 1/2 TO 1 TABLET BY MOUTH EVERY 8 HOURS AS NEEDED FOR HIP PAIN (Patient taking differently: Take 50 mg by mouth every 8 (eight) hours as needed for moderate pain. TAKE 1/2 TO 1 TABLET BY MOUTH EVERY 8 HOURS AS NEEDED FOR HIP PAIN) 60 tablet 0   No current facility-administered medications on file prior to visit.     Review of Systems:  As per HPI- otherwise negative.   Physical Examination: Vitals:   02/17/18 1126  BP: (!) 158/68  Pulse: 83  Resp: 20  Temp: 98.4 F (36.9 C)  SpO2: 95%   Vitals:   02/17/18 1126  Weight: 162 lb (73.5 kg)  Height: 5\' 3"  (1.6 m)   Body mass index is 28.7 kg/m. Ideal Body Weight: Weight in (lb) to have BMI = 25: 140.8  GEN: WDWN, NAD, Non-toxic, A & O x 3, looks her normal self HEENT: Atraumatic, Normocephalic. Neck supple. No masses, No LAD. Ears and Nose: No external deformity. CV: RRR, No M/G/R. No JVD. No thrill. No extra heart sounds. PULM: Mild rhonchi and minimal wheezing bilaterally. No retractions. No resp. distress. No accessory muscle use. ABD: S, NT, ND. No rebound. No HSM. EXTR: No c/c/e NEURO sitting in wheelchair today PSYCH: Normally interactive. Conversant. Not depressed or anxious appearing.  Calm  demeanor.    Assessment and Plan: Chronic anemia - Plan: CBC, CANCELED: Rew Hospital discharge follow-up - Plan: Basic metabolic panel  DOE (dyspnea on exertion)  Bladder tumor  ILD (interstitial lung disease) ([facility])  History of atrial fibrillation  Cough - Plan: benzonatate (TESSALON) 100 MG capsule  Crystal Kerr is following up from recent hospital admission.  She has been receiving BCG bladder infusions, and notes that after her 2 recent infusions she became significantly ill.  She does not wish to pursue these infusions any longer.  I will plan to touch base with her urologist and let them know  She is currently on antibiotics and oral steroids, and feels that her lung symptoms are resolving.  She does have interstitial lung disease at baseline, and follows up with pulmonology I have contacted her pulmonary provider about her, and asked about a follow-up visit with him  She is still bothered by cough, we will try Tessalon Perles and over-the-counter Delsym  She is rate controlled diltiazem, and takes Eliquis for anticoagulation  Her cardiology PA had ordered a basic metabolic profile to be drawn next week.  We will go ahead and do this today in hopes of decreasing her blood draws  Signed Lamar Blinks, MD  Received her labs as below Her kidney function is stable I had been concerned about drop in hemoglobin, but this appears to be improving Leukocytosis likely due to current steroid use  Message to patient regarding her labs  Results for orders placed or performed in visit on 70/62/37  Basic metabolic panel  Result Value Ref Range   Sodium 140 135 - 145 mEq/L   Potassium 4.5 3.5 - 5.1 mEq/L   Chloride 102 96 - 112 mEq/L   CO2 29 19 - 32 mEq/L   Glucose, Bld 162 (H) 70 - 99 mg/dL   BUN 24 (H) 6 - 23 mg/dL   Creatinine, Ser 1.06 0.40 - 1.20 mg/dL   Calcium 8.9 8.4 -  10.5 mg/dL   GFR 48.56 (L) >60.00 mL/min  CBC  Result Value Ref Range   WBC 16.1 (H) 4.0 - 10.5  K/uL   RBC 4.38 3.87 - 5.11 Mil/uL   Platelets 369.0 150.0 - 400.0 K/uL   Hemoglobin 9.9 (L) 12.0 - 15.0 g/dL   HCT 31.9 (L) 36.0 - 46.0 %   MCV 72.9 (L) 78.0 - 100.0 fl   MCHC 30.9 30.0 - 36.0 g/dL   RDW 18.6 (H) 11.5 - 15.5 %

## 2018-02-16 NOTE — Telephone Encounter (Signed)
Call done with daughter [name]- ok per [facility]  Transition Care Management Follow-up Telephone Call   Date discharged? 02/15/2018   How have you been since you were released from the hospital? "She is doing much better. She is still weak and has some cough"   Do you understand why you were in the hospital? yes   Do you understand the discharge instructions? yes   Where were you discharged to? Home. Lives with daughter. Daughter-n-law comes to help when needed.   Items Reviewed:  Medications reviewed: no, did not currently have list. Will discuss at PCP appt tomorrow.  Allergies reviewed: yes  Dietary changes reviewed: yes  Referrals reviewed: yes   Functional Questionnaire:   Activities of Daily Living (ADLs):   She states they are independent in the following: ambulation, feeding, continence and toileting States they require assistance with the following: bathing and hygiene, grooming and dressing   Any transportation issues/concerns?: no   Any patient concerns? Daughter states that they are going to need some help with caring for her mother because it is becoming too much for her.   Confirmed importance and date/time of follow-up visits scheduled yes  Provider Appointment booked with PCP 02/17/2018 @1130    Confirmed with patient if condition begins to worsen call PCP or go to the ER.  Patient was given the office number and encouraged to call back with question or concerns.  : yes

## 2018-02-17 ENCOUNTER — Telehealth: Payer: Self-pay | Admitting: Family Medicine

## 2018-02-17 ENCOUNTER — Encounter: Payer: Self-pay | Admitting: Family Medicine

## 2018-02-17 ENCOUNTER — Ambulatory Visit (INDEPENDENT_AMBULATORY_CARE_PROVIDER_SITE_OTHER): Payer: Medicare Other | Admitting: Family Medicine

## 2018-02-17 VITALS — BP 158/68 | HR 83 | Temp 98.4°F | Resp 20 | Ht 63.0 in | Wt 162.0 lb

## 2018-02-17 DIAGNOSIS — J849 Interstitial pulmonary disease, unspecified: Secondary | ICD-10-CM | POA: Diagnosis not present

## 2018-02-17 DIAGNOSIS — Z9181 History of falling: Secondary | ICD-10-CM | POA: Diagnosis not present

## 2018-02-17 DIAGNOSIS — J209 Acute bronchitis, unspecified: Secondary | ICD-10-CM | POA: Diagnosis not present

## 2018-02-17 DIAGNOSIS — Z7951 Long term (current) use of inhaled steroids: Secondary | ICD-10-CM | POA: Diagnosis not present

## 2018-02-17 DIAGNOSIS — Z8701 Personal history of pneumonia (recurrent): Secondary | ICD-10-CM | POA: Diagnosis not present

## 2018-02-17 DIAGNOSIS — J4541 Moderate persistent asthma with (acute) exacerbation: Secondary | ICD-10-CM | POA: Diagnosis not present

## 2018-02-17 DIAGNOSIS — M4690 Unspecified inflammatory spondylopathy, site unspecified: Secondary | ICD-10-CM | POA: Diagnosis not present

## 2018-02-17 DIAGNOSIS — J440 Chronic obstructive pulmonary disease with acute lower respiratory infection: Secondary | ICD-10-CM | POA: Diagnosis not present

## 2018-02-17 DIAGNOSIS — R0609 Other forms of dyspnea: Secondary | ICD-10-CM

## 2018-02-17 DIAGNOSIS — D649 Anemia, unspecified: Secondary | ICD-10-CM

## 2018-02-17 DIAGNOSIS — D494 Neoplasm of unspecified behavior of bladder: Secondary | ICD-10-CM | POA: Diagnosis not present

## 2018-02-17 DIAGNOSIS — H8110 Benign paroxysmal vertigo, unspecified ear: Secondary | ICD-10-CM | POA: Diagnosis not present

## 2018-02-17 DIAGNOSIS — Z09 Encounter for follow-up examination after completed treatment for conditions other than malignant neoplasm: Secondary | ICD-10-CM

## 2018-02-17 DIAGNOSIS — J441 Chronic obstructive pulmonary disease with (acute) exacerbation: Secondary | ICD-10-CM | POA: Diagnosis not present

## 2018-02-17 DIAGNOSIS — G629 Polyneuropathy, unspecified: Secondary | ICD-10-CM | POA: Diagnosis not present

## 2018-02-17 DIAGNOSIS — Z8679 Personal history of other diseases of the circulatory system: Secondary | ICD-10-CM

## 2018-02-17 DIAGNOSIS — N289 Disorder of kidney and ureter, unspecified: Secondary | ICD-10-CM | POA: Diagnosis not present

## 2018-02-17 DIAGNOSIS — R05 Cough: Secondary | ICD-10-CM

## 2018-02-17 DIAGNOSIS — I5032 Chronic diastolic (congestive) heart failure: Secondary | ICD-10-CM | POA: Diagnosis not present

## 2018-02-17 DIAGNOSIS — Z8551 Personal history of malignant neoplasm of bladder: Secondary | ICD-10-CM | POA: Diagnosis not present

## 2018-02-17 DIAGNOSIS — Z792 Long term (current) use of antibiotics: Secondary | ICD-10-CM | POA: Diagnosis not present

## 2018-02-17 DIAGNOSIS — R059 Cough, unspecified: Secondary | ICD-10-CM

## 2018-02-17 DIAGNOSIS — I493 Ventricular premature depolarization: Secondary | ICD-10-CM | POA: Diagnosis not present

## 2018-02-17 DIAGNOSIS — Z87891 Personal history of nicotine dependence: Secondary | ICD-10-CM | POA: Diagnosis not present

## 2018-02-17 DIAGNOSIS — Z7901 Long term (current) use of anticoagulants: Secondary | ICD-10-CM | POA: Diagnosis not present

## 2018-02-17 DIAGNOSIS — I480 Paroxysmal atrial fibrillation: Secondary | ICD-10-CM | POA: Diagnosis not present

## 2018-02-17 LAB — CBC
HCT: 31.9 % — ABNORMAL LOW (ref 36.0–46.0)
Hemoglobin: 9.9 g/dL — ABNORMAL LOW (ref 12.0–15.0)
MCHC: 30.9 g/dL (ref 30.0–36.0)
MCV: 72.9 fl — ABNORMAL LOW (ref 78.0–100.0)
Platelets: 369 10*3/uL (ref 150.0–400.0)
RBC: 4.38 Mil/uL (ref 3.87–5.11)
RDW: 18.6 % — ABNORMAL HIGH (ref 11.5–15.5)
WBC: 16.1 10*3/uL — ABNORMAL HIGH (ref 4.0–10.5)

## 2018-02-17 LAB — BASIC METABOLIC PANEL
BUN: 24 mg/dL — ABNORMAL HIGH (ref 6–23)
CALCIUM: 8.9 mg/dL (ref 8.4–10.5)
CHLORIDE: 102 meq/L (ref 96–112)
CO2: 29 meq/L (ref 19–32)
CREATININE: 1.06 mg/dL (ref 0.40–1.20)
GFR: 48.56 mL/min — ABNORMAL LOW (ref >60.00)
Glucose, Bld: 162 mg/dL — ABNORMAL HIGH (ref 70–99)
Potassium: 4.5 mEq/L (ref 3.5–5.1)
Sodium: 140 mEq/L (ref 135–145)

## 2018-02-17 MED ORDER — BENZONATATE 100 MG PO CAPS: 100.0000 mg | ORAL_CAPSULE | Freq: Three times a day (TID) | ORAL | 0 refills | Status: DC | PRN

## 2018-02-17 NOTE — Patient Instructions (Addendum)
Good to see you today!  I will contact Tammy about your symbicort and Lurena Joiner abut your blood test Try delsym OTC, tessalon perles as needed for cough Please look into a home aid agency.  If they need a referral from me I can certainly provide!  Please call your urologist and let them know that you no longer wish to do the bladder treatments   I'll be in touch with your labs asap

## 2018-02-17 NOTE — Telephone Encounter (Signed)
Copied from [place] [id number]. Topic: Quick Communication - Home Health Verbal Orders >> Feb 17, 2018 10:17 AM Vernona Rieger wrote: Caller/Agency: Pramod with [facility] Number: [phone] Requesting OT/PT/Skilled Nursing/Social Work: physical therapy Frequency: 2 times a week for 3 weeks

## 2018-02-17 NOTE — Telephone Encounter (Signed)
Verbal orders given  

## 2018-02-18 ENCOUNTER — Encounter: Payer: Self-pay | Admitting: Family Medicine

## 2018-02-18 ENCOUNTER — Telehealth: Payer: Self-pay

## 2018-02-18 NOTE — Telephone Encounter (Signed)
Called and LM for [name] that pt no longer wishes to do BCG, please contact her to discuss any other treatment options.  Call me if any ?, left my cell number

## 2018-02-18 NOTE — Telephone Encounter (Signed)
-----   Message from Erlene Quan, [place] sent at 02/18/2018  9:19 AM EST ----- T please cancel the BMP I ordered for next week- her PCP did one  LK

## 2018-02-18 NOTE — Telephone Encounter (Signed)
Patient's daughter [name] notified and voiced understanding.

## 2018-02-18 NOTE — Telephone Encounter (Signed)
-----   Message from [name], MD sent at 02/17/2018  7:48 PM EST ----- Regarding: Urology Call urology and discuss her situation

## 2018-02-21 DIAGNOSIS — J440 Chronic obstructive pulmonary disease with acute lower respiratory infection: Secondary | ICD-10-CM | POA: Diagnosis not present

## 2018-02-21 DIAGNOSIS — J441 Chronic obstructive pulmonary disease with (acute) exacerbation: Secondary | ICD-10-CM | POA: Diagnosis not present

## 2018-02-21 DIAGNOSIS — J209 Acute bronchitis, unspecified: Secondary | ICD-10-CM | POA: Diagnosis not present

## 2018-02-21 DIAGNOSIS — J4541 Moderate persistent asthma with (acute) exacerbation: Secondary | ICD-10-CM | POA: Diagnosis not present

## 2018-02-21 DIAGNOSIS — H8110 Benign paroxysmal vertigo, unspecified ear: Secondary | ICD-10-CM | POA: Diagnosis not present

## 2018-02-21 DIAGNOSIS — M4690 Unspecified inflammatory spondylopathy, site unspecified: Secondary | ICD-10-CM | POA: Diagnosis not present

## 2018-02-23 ENCOUNTER — Ambulatory Visit (INDEPENDENT_AMBULATORY_CARE_PROVIDER_SITE_OTHER): Payer: Medicare Other | Admitting: Adult Health

## 2018-02-23 ENCOUNTER — Encounter: Payer: Self-pay | Admitting: Adult Health

## 2018-02-23 DIAGNOSIS — I5032 Chronic diastolic (congestive) heart failure: Secondary | ICD-10-CM

## 2018-02-23 DIAGNOSIS — J45909 Unspecified asthma, uncomplicated: Secondary | ICD-10-CM

## 2018-02-23 DIAGNOSIS — J849 Interstitial pulmonary disease, unspecified: Secondary | ICD-10-CM | POA: Diagnosis not present

## 2018-02-23 DIAGNOSIS — J209 Acute bronchitis, unspecified: Secondary | ICD-10-CM

## 2018-02-23 MED ORDER — MOMETASONE FURO-FORMOTEROL FUM 200-5 MCG/ACT IN AERO: 2.0000 | INHALATION_SPRAY | Freq: Two times a day (BID) | RESPIRATORY_TRACT | 5 refills | Status: DC

## 2018-02-23 MED ORDER — PREDNISONE 5 MG PO TABS: ORAL_TABLET | ORAL | 0 refills | Status: DC

## 2018-02-23 NOTE — Assessment & Plan Note (Signed)
Recent flare -now improved after abx and steroids  Concern that her bladder cancer treatments-BCG were causing her a hypersensitivity reaction +/- weakness leading to decompensation  She has decided to stop further treatment for her bladder cancer.  No further antibiotics at this time .  Will slowly taper off lower dose of steroids over next week for subjective symptoms .

## 2018-02-23 NOTE — Assessment & Plan Note (Signed)
Appears stable  No changes  

## 2018-02-23 NOTE — Patient Instructions (Addendum)
Low salt diet .  Lasix 20mg  daily As needed  For swelling  Change Symbicort to Dulera 256mcg 2 puffs Twice daily  , rinse after use. -call if not covered with insurance .  Prednisone 10mg  daily  For 3 days then 5mg  daily for 3 days and then stop  Follow up with  Dr. [name]  Or Reinette Cuneo NP 6 weeks and As needed  - at [facility]  Please contact office for sooner follow up if symptoms do not improve or worsen or seek emergency care

## 2018-02-23 NOTE — Progress Notes (Signed)
Reviewed and agree with assessment/plan.   Chrystie Hagwood, MD [facility] 01/08/2016, 12:24 PM Pager:  [phone]  

## 2018-02-23 NOTE — Assessment & Plan Note (Signed)
Appears stable  Will change to [facility] due to insurance   Plan  Patient Instructions  Low salt diet .  Lasix 20mg  daily As needed  For swelling  Change Symbicort to Dulera 225mcg 2 puffs Twice daily  , rinse after use. -call if not covered with insurance .  Prednisone 10mg  daily  For 3 days then 5mg  daily for 3 days and then stop  Follow up with  Dr. [name]  Or [name] 6 weeks and As needed  - at [facility]  Please contact office for sooner follow up if symptoms do not improve or worsen or seek emergency care

## 2018-02-23 NOTE — Assessment & Plan Note (Signed)
Appears stable . Recent CT chest without progressive changes  O2 sats normal on room air  No changes

## 2018-02-23 NOTE — Progress Notes (Signed)
@Patient  ID: Crystal Kerr, female    DOB: Feb 11, 1924, 85 y.o.   MRN: [id number]  Chief Complaint  Patient presents with  . Hospitalization Follow-up    Bronchitis     Referring provider: [name], MD  HPI: 85 year old femaleformerseen in May 2018 after a pneumonia complicated by rhinovirus.  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   TEST/EVENTS : Echo 2015 EF 65%, Gr 2 DD , Mod Dilated RA  PFTs 03/20/14 FEV1 1.28 (85%) ratio 75 s resp to saba and dlco 68 corrects to 103  CT chest Jun 02, 2016 showed groundglass opacities and clustered nodules compatible with an acute pneumonia. There is suggestion of cavitation with a larger nodules although this is difficult to distinguish from the bronchiectatic changes.  CT chest September 22, 2016 showed market improvement in the multifocal bilateralcentrallyairspace disease on previous study.  02/2017 -Tx for PNA andpossible amiodarone pulmonary toxicity. Elevated sed rate at 99. Amiodarone was discontinued High-resolution CT chest February 2019>extensive patchy groundglass opacities throughout both lungs involving all lung lobes largely new since September 2018 with mild mediastinal lymphadenopathy  HRCT August 17, 2017>shows some residual groundglass attenuation and scattered areas of mild bronchiectasis. No honeycombing. Several regions of groundglass attenuation on prior study have resolved.  02/23/2018 Follow up : Asthma, ILD , D CHF , Post hospital follow up  Patient presents for a post hospital follow-up.  She was recently admitted earlier this month for acute hypoxic respiratory failure with ILD flare and acute bronchitis.  CT chest showed no  pulmonary embolism.  Stable groundglass attenuation.  Renal cyst.  2D echo showed EF greater than 65%, trivial pericardial effusion influenza swab negative she was treated with IV antibiotics steroids and nebulized bronchodilators.  Discharged on doxycycline.  There was concern that her bladder cancer treatments with BCG could be causing a hypersensitivity reaction.   And referred to urology in the outpatient setting to discuss alternative treatments. Pt has decided to not pursue further treatment . She is aware of the risks of this decision. Says quality of life is more important to her .  She was discharged on a prednisone 20mg  daily for 7 days , says stopping this she noticed significant change in energy level, feels it stopped abrupty.  Since discharge patient says she is doing starting to do better.  She is receiving PT at home.  Remains very weak , is very eager to regain her strenght.  Uses protein shake .   Patient has known diastolic dysfunction.  She remains on Lasix 20 mg as needed.  Denies any increased leg swelling  Patient does have asthma.  Remains on Symbicort.  Says that she has finished her prednisone taper as above.  Overall says her breathing is doing okay. Has dyspnea and gives out of energy with light activity . No wheezing or cough  Insurance has stopped covering Symbicort, does not know what her formulary will cover.       No Known Allergies  Immunization History  Administered Date(s) Administered  . Influenza Split 10/11/2011, 11/28/2012, 10/02/2016  . Influenza, High Dose Seasonal PF 11/23/2017  . Influenza,inj,Quad PF,6+ Mos 11/07/2014, 09/12/2015  . Influenza-Unspecified 11/19/2013  . Pneumococcal Conjugate-13 12/25/2015  . Pneumococcal Polysaccharide-23 01/13/2008    Past Medical History:  Diagnosis Date  . Arthritis   .  Asthma   . Atrial fibrillation ([place])    a. s/p DCCV in 2015, on Amiodarone and Eliquis  . CHF (congestive heart failure) ([place])   . COPD  (chronic obstructive pulmonary disease) ([place])   . Dysrhythmia    afib -hx   . Neuromuscular disorder ([place])    peripheral neuropathy   . Pneumonia    hx of x 2   . PONV (postoperative nausea and vomiting)   . PVC's (premature ventricular contractions)     Tobacco History: Social History   Tobacco Use  Smoking Status Former Smoker  . Packs/day: 1.00  . Years: 20.00  . Pack years: 20.00  . Types: Cigarettes  . Last attempt to quit: 04/13/1981  . Years since quitting: 36.8  Smokeless Tobacco Never Used   Counseling given: Not Answered   Outpatient Medications Prior to Visit  Medication Sig Dispense Refill  . acetaminophen (TYLENOL) 325 MG tablet Take 2 tablets (650 mg total) by mouth every 6 (six) hours as needed for mild pain or headache. 30 tablet 0  . albuterol (PROAIR HFA) 108 (90 Base) MCG/ACT inhaler Inhale 2 puffs into the lungs every 4 (four) hours as needed for wheezing or shortness of breath. 18 g 11  . benzonatate (TESSALON) 100 MG capsule Take 1 capsule (100 mg total) by mouth 3 (three) times daily as needed for cough. 40 capsule 0  . budesonide-formoterol (SYMBICORT) 160-4.5 MCG/ACT inhaler Inhale 2 puffs into the lungs 2 (two) times daily. 10.2 Inhaler 4  . diltiazem (TIAZAC) 120 MG 24 hr capsule TAKE 1 CAPSULE BY MOUTH DAILY (Patient taking differently: Take 120 mg by mouth daily. ) 90 capsule 3  . ELIQUIS 5 MG TABS tablet TAKE 1 TABLET(5 MG) BY MOUTH TWICE DAILY (Patient taking differently: Take 5 mg by mouth 2 (two) times daily. ) 180 tablet 1  . fluticasone (FLONASE) 50 MCG/ACT nasal spray Place 1 spray into both nostrils daily as needed for allergies. 16 g 0  . furosemide (LASIX) 20 MG tablet Take 1 tablet (20 mg total) by mouth daily. Use as needed for swelling (Patient taking differently: Take 20 mg by mouth daily as needed for fluid. Use as needed for swelling) 90 tablet 3  . guaiFENesin (MUCINEX) 600 MG 12 hr tablet Take 1 tablet (600 mg total) by mouth 2 (two)  times daily as needed for cough. 20 tablet 0  . mirabegron ER (MYRBETRIQ) 25 MG TB24 tablet Take 1 tablet (25 mg total) by mouth daily. Use for bladder symptoms 30 tablet 6  . montelukast (SINGULAIR) 10 MG tablet TAKE 1 TABLET BY MOUTH DAILY 90 tablet 0  . Multiple Vitamins-Minerals (MULTIVITAMIN WITH MINERALS) tablet Take 1 tablet by mouth daily.    . pantoprazole (PROTONIX) 40 MG tablet Take 1 tablet (40 mg total) by mouth daily before breakfast. 90 tablet 3  . traMADol (ULTRAM) 50 MG tablet TAKE 1/2 TO 1 TABLET BY MOUTH EVERY 8 HOURS AS NEEDED FOR HIP PAIN (Patient taking differently: Take 50 mg by mouth every 8 (eight) hours as needed for moderate pain. TAKE 1/2 TO 1 TABLET BY MOUTH EVERY 8 HOURS AS NEEDED FOR HIP PAIN) 60 tablet 0   No facility-administered medications prior to visit.      Review of Systems:   Constitutional:   No  weight loss, night sweats,  Fevers, chills,  +fatigue, or  lassitude.  HEENT:   No headaches,  Difficulty swallowing,  Tooth/dental problems, or  Sore throat,  No sneezing, itching, ear ache, nasal congestion, post nasal drip,   CV:  No chest pain,  Orthopnea, PND, swelling in lower extremities, anasarca, dizziness, palpitations, syncope.   GI  No heartburn, indigestion, abdominal pain, nausea, vomiting, diarrhea, change in bowel habits, loss of appetite, bloody stools.   Resp:    No chest wall deformity  Skin: no rash or lesions.  GU: no dysuria, change in color of urine, no urgency or frequency.  No flank pain, no hematuria   MS:  No joint pain or swelling.  No decreased range of motion.  No back pain.    Physical Exam  BP 124/66 (BP Location: Right Arm, Cuff Size: Normal)   Pulse 92   Ht 5\' 3"  (1.6 m)   Wt 169 lb (76.7 kg)   SpO2 97%   BMI 29.94 kg/m   GEN: A/Ox3; pleasant , NAD, frail elderly female in wc    HEENT:  [place]/AT,  EACs-clear, TMs-wnl, NOSE-clear, THROAT-clear, no lesions, no postnasal drip or exudate noted.    NECK:  Supple w/ fair ROM; no JVD; normal carotid impulses w/o bruits; no thyromegaly or nodules palpated; no lymphadenopathy.    RESP  Clear  P & A; w/o, wheezes/ rales/ or rhonchi. no accessory muscle use, no dullness to percussion  CARD:  RRR, no m/r/g, no peripheral edema, pulses intact, no cyanosis or clubbing.  GI:   Soft & nt; nml bowel sounds; no organomegaly or masses detected.   Musco: Warm bil, no deformities or joint swelling noted.   Neuro: alert, no focal deficits noted.    Skin: Warm, no lesions or rashes    Lab Results:  CBC    Component Value Date/Time   WBC 16.1 (H) 02/17/2018 1216   RBC 4.38 02/17/2018 1216   HGB 9.9 (L) 02/17/2018 1216   HGB 10.2 (L) 03/03/2017 1015   HCT 31.9 (L) 02/17/2018 1216   HCT 33.3 (L) 03/03/2017 1015   PLT 369.0 02/17/2018 1216   PLT 467 (H) 03/03/2017 1015   MCV 72.9 (L) 02/17/2018 1216   MCV 77 (L) 03/03/2017 1015   MCH 23.1 (L) 02/14/2018 0538   MCHC 30.9 02/17/2018 1216   RDW 18.6 (H) 02/17/2018 1216   RDW 16.7 (H) 03/03/2017 1015   LYMPHSABS 1.9 02/13/2018 1522   LYMPHSABS 1.7 03/03/2017 1015   MONOABS 1.5 (H) 02/13/2018 1522   EOSABS 0.2 02/13/2018 1522   EOSABS 0.3 03/03/2017 1015   BASOSABS 0.0 02/13/2018 1522   BASOSABS 0.0 03/03/2017 1015    BMET    Component Value Date/Time   NA 140 02/17/2018 1216   NA 141 03/03/2017 1015   K 4.5 02/17/2018 1216   CL 102 02/17/2018 1216   CO2 29 02/17/2018 1216   GLUCOSE 162 (H) 02/17/2018 1216   BUN 24 (H) 02/17/2018 1216   BUN 19 03/03/2017 1015   CREATININE 1.06 02/17/2018 1216   CREATININE 1.25 (H) 01/02/2014 1722   CALCIUM 8.9 02/17/2018 1216   CALCIUM 9.3 04/22/2011 0000   GFRNONAA 52 (L) 02/15/2018 0609   GFRNONAA 49 (L) 04/22/2011 0000   GFRAA >60 02/15/2018 0609   GFRAA 56 (L) 04/22/2011 0000    BNP    Component Value Date/Time   BNP 74.2 02/13/2018 1522   BNP 86.4 07/15/2013 1442    ProBNP    Component Value Date/Time   PROBNP 104.0 (H)  02/15/2013 1323    Imaging: Dg Chest 2 View  Result Date: 02/13/2018 CLINICAL DATA:  Fatigue and  shortness of breath. EXAM: CHEST - 2 VIEW COMPARISON:  01/27/2018 FINDINGS: Lungs are hyperexpanded Interstitial markings are diffusely coarsened with chronic features. Cardiopericardial silhouette is at upper limits of normal for size. The visualized bony structures of the thorax are intact. Telemetry leads overlie the chest. IMPRESSION: Emphysema without acute cardiopulmonary findings. Electronically Signed   By: Misty Stanley M.D.   On: 02/13/2018 15:50   Dg Chest 2 View  Result Date: 01/27/2018 CLINICAL DATA:  Fatigue and shortness of breath since 01/22/2017. EXAM: CHEST - 2 VIEW COMPARISON:  PA and lateral chest 01/25/2018 and 01/18/2018. CT chest 03/04/2017. FINDINGS: Prominence of the pulmonary interstitium appears unchanged. No consolidative process, pneumothorax or effusion. Heart size is normal. Aortic atherosclerosis is noted. No acute or focal bony abnormality. IMPRESSION: No acute disease. No change in prominence of the pulmonary interstitium Atherosclerosis. Electronically Signed   By: Inge Rise M.D.   On: 01/27/2018 13:25   Dg Chest 2 View  Result Date: 01/26/2018 CLINICAL DATA:  Shortness of breath on exertion. Bladder cancer. Interstitial lung disease. EXAM: CHEST - 2 VIEW COMPARISON:  01/18/2018 and CT chest 08/17/2017. FINDINGS: Trachea is midline. Heart size stable. Thoracic aorta is calcified. Mild interstitial coarsening, better seen on 08/17/2017. Prominent epicardial fat. Calcified granuloma in the left upper lobe. Lungs are otherwise clear. No pleural fluid. IMPRESSION: 1. No acute findings. 2. Interstitial lung disease is better visualized and characterized on 08/17/2017. 3.  Aortic atherosclerosis (ICD10-170.0). Electronically Signed   By: Lorin Picket M.D.   On: 01/26/2018 08:25   Ct Angio Chest Pe W Or Wo Contrast  Result Date: 02/14/2018 CLINICAL DATA:  Shortness of  breath. EXAM: CT ANGIOGRAPHY CHEST WITH CONTRAST TECHNIQUE: Multidetector CT imaging of the chest was performed using the standard protocol during bolus administration of intravenous contrast. Multiplanar CT image reconstructions and MIPs were obtained to evaluate the vascular anatomy. CONTRAST:  116mL ISOVUE-370 IOPAMIDOL (ISOVUE-370) INJECTION 76% COMPARISON:  08/17/2017 FINDINGS: Cardiovascular: There is moderate cardiac enlargement. Aortic atherosclerosis. The main pulmonary artery appears patent. No saddle embolus. Lower lobe segmental pulmonary arteries are slightly obscured due to respiratory motion artifact. Within this limitation no lobar or segmental pulmonary artery filling defects identified to suggest a clinically significant acute pulmonary embolus. Mediastinum/Nodes: Normal appearance of the thyroid gland. The trachea appears patent and is midline. Normal appearance of the esophagus. No enlarged mediastinal or hilar adenopathy. No axillary or supraclavicular adenopathy. Lungs/Pleura: Mild dependent changes identified overlying the posterior right lung base. No airspace consolidation, atelectasis or pneumothorax. Scattered areas of ground-glass attenuation are identified bilaterally. Left upper lobe calcified granuloma noted. Upper Abdomen: There is a complicated cyst within the upper pole of the left kidney measuring 2.5 cm and 31 HU. On 12/20/17 this measured 1.4 cm and 22 HU. Short-term interval increase in size and attenuation suggest interval hemorrhage. Musculoskeletal: Scoliosis deformity involves the thoracic spine. Review of the MIP images confirms the above findings. IMPRESSION: 1. No evidence for acute pulmonary embolus. 2.  Aortic Atherosclerosis (ICD10-I70.0). 3. Increase in size and attenuation of right upper pole kidney cyst which may be complicated by interval hemorrhage compared with 12/20/2017. Suggest nonemergent follow-up imaging with without and with contrast CT or MRI of the  kidneys. A CT may be more ideal for patients that have difficulty with breath hold. This should be done after resolution of patient's current clinical situation and the patient is able to remain still and breath hold. Electronically Signed   By: Queen Slough.D.  On: 02/14/2018 11:16      PFT Results Latest Ref Rng & Units 03/19/2014  FVC-Pre L 1.58  FVC-Predicted Pre % 77  FVC-Post L 1.70  FVC-Predicted Post % 83  Pre FEV1/FVC % % 74  Post FEV1/FCV % % 75  FEV1-Pre L 1.17  FEV1-Predicted Pre % 78  FEV1-Post L 1.28  DLCO UNC% % 68  DLCO COR %Predicted % 103  TLC L 3.43  TLC % Predicted % 72  RV % Predicted % 17    No results found for: NITRICOXIDE      Assessment & Plan:   ILD (interstitial lung disease) (HCC) Appears stable . Recent CT chest without progressive changes  O2 sats normal on room air  No changes   Acute bronchitis Recent flare -now improved after abx and steroids  Concern that her bladder cancer treatments-BCG were causing her a hypersensitivity reaction +/- weakness leading to decompensation  She has decided to stop further treatment for her bladder cancer.  No further antibiotics at this time .  Will slowly taper off lower dose of steroids over next week for subjective symptoms .    Chronic diastolic heart failure (HCC) Appears stable  No changes   Intrinsic asthma Appears stable  Will change to [facility] due to insurance   Plan  Patient Instructions  Low salt diet .  Lasix 20mg  daily As needed  For swelling  Change Symbicort to Dulera 253mcg 2 puffs Twice daily  , rinse after use. -call if not covered with insurance .  Prednisone 10mg  daily  For 3 days then 5mg  daily for 3 days and then stop  Follow up with  Dr. [name]  Or Danta Baumgardner NP 6 weeks and As needed  - at [place]  Please contact office for sooner follow up if symptoms do not improve or worsen or seek emergency care          [name], NP 02/23/2018

## 2018-02-24 DIAGNOSIS — J440 Chronic obstructive pulmonary disease with acute lower respiratory infection: Secondary | ICD-10-CM | POA: Diagnosis not present

## 2018-02-24 DIAGNOSIS — H8110 Benign paroxysmal vertigo, unspecified ear: Secondary | ICD-10-CM | POA: Diagnosis not present

## 2018-02-24 DIAGNOSIS — M4690 Unspecified inflammatory spondylopathy, site unspecified: Secondary | ICD-10-CM | POA: Diagnosis not present

## 2018-02-24 DIAGNOSIS — J441 Chronic obstructive pulmonary disease with (acute) exacerbation: Secondary | ICD-10-CM | POA: Diagnosis not present

## 2018-02-24 DIAGNOSIS — J209 Acute bronchitis, unspecified: Secondary | ICD-10-CM | POA: Diagnosis not present

## 2018-02-24 DIAGNOSIS — J4541 Moderate persistent asthma with (acute) exacerbation: Secondary | ICD-10-CM | POA: Diagnosis not present

## 2018-02-27 ENCOUNTER — Other Ambulatory Visit: Payer: Self-pay | Admitting: Cardiology

## 2018-02-28 DIAGNOSIS — J440 Chronic obstructive pulmonary disease with acute lower respiratory infection: Secondary | ICD-10-CM | POA: Diagnosis not present

## 2018-02-28 DIAGNOSIS — J4541 Moderate persistent asthma with (acute) exacerbation: Secondary | ICD-10-CM | POA: Diagnosis not present

## 2018-02-28 DIAGNOSIS — J209 Acute bronchitis, unspecified: Secondary | ICD-10-CM | POA: Diagnosis not present

## 2018-02-28 DIAGNOSIS — H8110 Benign paroxysmal vertigo, unspecified ear: Secondary | ICD-10-CM | POA: Diagnosis not present

## 2018-02-28 DIAGNOSIS — J441 Chronic obstructive pulmonary disease with (acute) exacerbation: Secondary | ICD-10-CM | POA: Diagnosis not present

## 2018-02-28 DIAGNOSIS — M4690 Unspecified inflammatory spondylopathy, site unspecified: Secondary | ICD-10-CM | POA: Diagnosis not present

## 2018-03-03 ENCOUNTER — Ambulatory Visit (INDEPENDENT_AMBULATORY_CARE_PROVIDER_SITE_OTHER): Payer: Medicare Other | Admitting: Cardiology

## 2018-03-03 ENCOUNTER — Encounter: Payer: Self-pay | Admitting: Cardiology

## 2018-03-03 ENCOUNTER — Telehealth: Payer: Self-pay | Admitting: *Deleted

## 2018-03-03 ENCOUNTER — Other Ambulatory Visit: Payer: Self-pay

## 2018-03-03 VITALS — BP 120/67 | HR 81 | Ht 63.0 in | Wt 170.8 lb

## 2018-03-03 DIAGNOSIS — Z8679 Personal history of other diseases of the circulatory system: Secondary | ICD-10-CM

## 2018-03-03 DIAGNOSIS — I5032 Chronic diastolic (congestive) heart failure: Secondary | ICD-10-CM

## 2018-03-03 DIAGNOSIS — J849 Interstitial pulmonary disease, unspecified: Secondary | ICD-10-CM

## 2018-03-03 DIAGNOSIS — J209 Acute bronchitis, unspecified: Secondary | ICD-10-CM

## 2018-03-03 DIAGNOSIS — Z7901 Long term (current) use of anticoagulants: Secondary | ICD-10-CM | POA: Diagnosis not present

## 2018-03-03 NOTE — Progress Notes (Signed)
03/03/2018 Crystal Kerr   03/27/1924  [id number]  Primary Physician Copland, Gay Filler, MD Primary Cardiologist: Dr [name]  HPI:  Patient is a delightful 85 year old female with a history of PAF. This was diagnosed in 2015 in the setting of a viral pneumonia. She was placed on amiodarone and Eliquis at that time. Amiodarone was later discontinued for possible interstitial lung disease. She is followed by [facility].She carries a diagnosis of presumptive diastolic heart failure. She has lower extremity edema unless she takes her diuretics, but she doesn't like taking them. She is on Eliquis 5 mg BID.  She has invasive bladder cancer and started treatments in Dec 2019.   In early Jan 2020 she developed a URI.  She was seen by pulmonary and placed on a steroid dose pack and encouraged to take her Lasix for a few days in a row. She presented about a week later on 01/27/2018 with AF and was cardioverted in the ED and discharged.  Labs then showed her SCr was elevated 1.64, Troponin negative, CXR showed NAD.  She was seen in the office 02/10/2018 for follow up.  She c/o a cough and had coarse rhonchi bilaterally.  I did not think she was in CHF but suggested she try taking her Lasix MWF.  The pt was to return in two weeks but ended up in the hospital 02/14/2018.  She was admitted and treated for bronchitis.  Today she feels well- no dyspnea.  Only a slight non productive cough.  Recent BMP shows her SCr is stable -1.06 (no need to decrease Eliquis). The pt feels the recent bronchitis episodes have something to do with her bladder treatments and is going to speak with her Urologist about further treatments.    Current Outpatient Medications  Medication Sig Dispense Refill  . acetaminophen (TYLENOL) 325 MG tablet Take 2 tablets (650 mg total) by mouth every 6 (six) hours as needed for mild pain or headache. 30 tablet 0  . albuterol (PROAIR HFA) 108 (90 Base) MCG/ACT inhaler Inhale  2 puffs into the lungs every 4 (four) hours as needed for wheezing or shortness of breath. 18 g 11  . benzonatate (TESSALON) 100 MG capsule Take 1 capsule (100 mg total) by mouth 3 (three) times daily as needed for cough. 40 capsule 0  . budesonide-formoterol (SYMBICORT) 160-4.5 MCG/ACT inhaler Inhale 2 puffs into the lungs 2 (two) times daily. 10.2 Inhaler 4  . diltiazem (TIAZAC) 120 MG 24 hr capsule TAKE 1 CAPSULE BY MOUTH DAILY (Patient taking differently: Take 120 mg by mouth daily. ) 90 capsule 3  . ELIQUIS 5 MG TABS tablet TAKE 1 TABLET(5 MG) BY MOUTH TWICE DAILY 180 tablet 1  . fluticasone (FLONASE) 50 MCG/ACT nasal spray Place 1 spray into both nostrils daily as needed for allergies. 16 g 0  . furosemide (LASIX) 20 MG tablet Take 1 tablet (20 mg total) by mouth daily. Use as needed for swelling (Patient taking differently: Take 20 mg by mouth daily as needed for fluid. Use as needed for swelling) 90 tablet 3  . guaiFENesin (MUCINEX) 600 MG 12 hr tablet Take 1 tablet (600 mg total) by mouth 2 (two) times daily as needed for cough. 20 tablet 0  . mirabegron ER (MYRBETRIQ) 25 MG TB24 tablet Take 1 tablet (25 mg total) by mouth daily. Use for bladder symptoms 30 tablet 6  . mometasone-formoterol (DULERA) 200-5 MCG/ACT AERO Inhale 2 puffs into the lungs 2 (two) times daily. 1 Inhaler  5  . montelukast (SINGULAIR) 10 MG tablet TAKE 1 TABLET BY MOUTH DAILY 90 tablet 0  . Multiple Vitamins-Minerals (MULTIVITAMIN WITH MINERALS) tablet Take 1 tablet by mouth daily.    . pantoprazole (PROTONIX) 40 MG tablet Take 1 tablet (40 mg total) by mouth daily before breakfast. 90 tablet 3  . traMADol (ULTRAM) 50 MG tablet TAKE 1/2 TO 1 TABLET BY MOUTH EVERY 8 HOURS AS NEEDED FOR HIP PAIN (Patient taking differently: Take 50 mg by mouth every 8 (eight) hours as needed for moderate pain. TAKE 1/2 TO 1 TABLET BY MOUTH EVERY 8 HOURS AS NEEDED FOR HIP PAIN) 60 tablet 0   No current facility-administered medications for  this visit.     No Known Allergies  Past Medical History:  Diagnosis Date  . Arthritis   . Asthma   . Atrial fibrillation ([place])    a. s/p DCCV in 2015, on Amiodarone and Eliquis  . CHF (congestive heart failure) ([place])   . COPD (chronic obstructive pulmonary disease) ([place])   . Dysrhythmia    afib -hx   . Neuromuscular disorder ([place])    peripheral neuropathy   . Pneumonia    hx of x 2   . PONV (postoperative nausea and vomiting)   . PVC's (premature ventricular contractions)     Social History   Socioeconomic History  . Marital status: Divorced    Spouse name: Not on file  . Number of children: Not on file  . Years of education: Not on file  . Highest education level: Not on file  Occupational History  . Occupation: Retired    Fish farm manager: RETIRED  Social Needs  . Financial resource strain: Not on file  . Food insecurity:    Worry: Not on file    Inability: Not on file  . Transportation needs:    Medical: Not on file    Non-medical: Not on file  Tobacco Use  . Smoking status: Former Smoker    Packs/day: 1.00    Years: 20.00    Pack years: 20.00    Types: Cigarettes    Last attempt to quit: 04/13/1981    Years since quitting: 36.9  . Smokeless tobacco: Never Used  Substance and Sexual Activity  . Alcohol use: Yes    Comment: Occasional  . Drug use: No  . Sexual activity: Never  Lifestyle  . Physical activity:    Days per week: Not on file    Minutes per session: Not on file  . Stress: Not on file  Relationships  . Social connections:    Talks on phone: Not on file    Gets together: Not on file    Attends religious service: Not on file    Active member of club or organization: Not on file    Attends meetings of clubs or organizations: Not on file    Relationship status: Not on file  . Intimate partner violence:    Fear of current or ex partner: Not on file    Emotionally abused: Not on file    Physically abused: Not on file    Forced sexual activity:  Not on file  Other Topics Concern  . Not on file  Social History Narrative  . Not on file     Family History  Problem Relation Age of Onset  . Heart failure Mother   . Diabetes Father   . Heart disease Other        No family history  Review of Systems: General: negative for chills, fever, night sweats or weight changes.  Cardiovascular: negative for chest pain, dyspnea on exertion, edema, orthopnea, palpitations, paroxysmal nocturnal dyspnea or shortness of breath Dermatological: negative for rash Respiratory: negative for cough or wheezing Urologic: negative for hematuria Abdominal: negative for nausea, vomiting, diarrhea, bright red blood per rectum, melena, or hematemesis Neurologic: negative for visual changes, syncope, or dizziness All other systems reviewed and are otherwise negative except as noted above.    Blood pressure 120/67, pulse 81, height 5\' 3"  (1.6 m), weight 170 lb 12.8 oz (77.5 kg), SpO2 96 %.  General appearance: alert, cooperative, no distress and in wheel chair Neck: no JVD Lungs: clear to auscultation bilaterally Heart: regular rate and rhythm Extremities: no edema Skin: warm and dry Neurologic: Grossly normal   ASSESSMENT AND PLAN:   Acute bronchitis Admitted 5/4-04/20/2008  Chronic diastolic heart failure ([place]) Echo done during Feb 2020 admission- normal LVF, normal wall thickness, mild LAE.  Current use of long term anticoagulation CHADS VASC= 4, on Eliquis  History of atrial fibrillation PAF Feb 2015 in setting of viral illness, recurrent PAF Jan 2020 in setting of bronchitis. Previously on Amiodarone which was stopped secondary to possible interstitial lung disease. She remains on Eliquis, in NSR today  ILD (interstitial lung disease) ([place]) Followed by [facility]- Amiodarone stopped previously F/U scheduled for March   PLAN  No change in Rx- cardiology f/u in 6 months.   Kerin Ransom PA-C 03/03/2018 10:15 AM

## 2018-03-03 NOTE — Assessment & Plan Note (Signed)
CHADS VASC=4, on Eliquis 

## 2018-03-03 NOTE — Assessment & Plan Note (Signed)
Followed by [facility]- Amiodarone stopped previously F/U scheduled for March

## 2018-03-03 NOTE — Assessment & Plan Note (Signed)
Echo done during Feb 2020 admission- normal LVF, normal wall thickness, mild LAE.

## 2018-03-03 NOTE — Patient Instructions (Signed)
Medication Instructions:  Your physician recommends that you continue on your current medications as directed. Please refer to the Current Medication list given to you today. If you need a refill on your cardiac medications before your next appointment, please call your pharmacy.   Lab work: NONE  If you have labs (blood work) drawn today and your tests are completely normal, you will receive your results only by: Marland Kitchen MyChart Message (if you have MyChart) OR . A paper copy in the mail If you have any lab test that is abnormal or we need to change your treatment, we will call you to review the results.  Testing/Procedures: NONE   Follow-Up: At [facility], you and your health needs are our priority.  As part of our continuing mission to provide you with exceptional heart care, we have created designated Provider Care Teams.  These Care Teams include your primary Cardiologist (physician) and Advanced Practice Providers (APPs -  Physician Assistants and Nurse Practitioners) who all work together to provide you with the care you need, when you need it. . Your physician recommends that you schedule a follow-up appointment in: [place], PA-C.  Any Other Special Instructions Will Be Listed Below (If Applicable).

## 2018-03-03 NOTE — Telephone Encounter (Signed)
Received Home Health Certification and Plan of Care; forwarded to provider/SLS 02/20 

## 2018-03-03 NOTE — Assessment & Plan Note (Signed)
PAF Feb 2015 in setting of viral illness, recurrent PAF Jan 2020 in setting of bronchitis. Previously on Amiodarone which was stopped secondary to possible interstitial lung disease. She remains on Eliquis, in NSR today

## 2018-03-03 NOTE — Assessment & Plan Note (Signed)
Admitted 2/3-02/15/2018

## 2018-03-04 DIAGNOSIS — J440 Chronic obstructive pulmonary disease with acute lower respiratory infection: Secondary | ICD-10-CM | POA: Diagnosis not present

## 2018-03-04 DIAGNOSIS — H8110 Benign paroxysmal vertigo, unspecified ear: Secondary | ICD-10-CM | POA: Diagnosis not present

## 2018-03-04 DIAGNOSIS — M4690 Unspecified inflammatory spondylopathy, site unspecified: Secondary | ICD-10-CM | POA: Diagnosis not present

## 2018-03-04 DIAGNOSIS — J441 Chronic obstructive pulmonary disease with (acute) exacerbation: Secondary | ICD-10-CM | POA: Diagnosis not present

## 2018-03-04 DIAGNOSIS — J4541 Moderate persistent asthma with (acute) exacerbation: Secondary | ICD-10-CM | POA: Diagnosis not present

## 2018-03-04 DIAGNOSIS — J209 Acute bronchitis, unspecified: Secondary | ICD-10-CM | POA: Diagnosis not present

## 2018-03-08 DIAGNOSIS — H8110 Benign paroxysmal vertigo, unspecified ear: Secondary | ICD-10-CM | POA: Diagnosis not present

## 2018-03-08 DIAGNOSIS — J209 Acute bronchitis, unspecified: Secondary | ICD-10-CM | POA: Diagnosis not present

## 2018-03-08 DIAGNOSIS — J441 Chronic obstructive pulmonary disease with (acute) exacerbation: Secondary | ICD-10-CM | POA: Diagnosis not present

## 2018-03-08 DIAGNOSIS — M4690 Unspecified inflammatory spondylopathy, site unspecified: Secondary | ICD-10-CM | POA: Diagnosis not present

## 2018-03-08 DIAGNOSIS — J440 Chronic obstructive pulmonary disease with acute lower respiratory infection: Secondary | ICD-10-CM | POA: Diagnosis not present

## 2018-03-08 DIAGNOSIS — J4541 Moderate persistent asthma with (acute) exacerbation: Secondary | ICD-10-CM | POA: Diagnosis not present

## 2018-03-11 DIAGNOSIS — J209 Acute bronchitis, unspecified: Secondary | ICD-10-CM | POA: Diagnosis not present

## 2018-03-11 DIAGNOSIS — J440 Chronic obstructive pulmonary disease with acute lower respiratory infection: Secondary | ICD-10-CM | POA: Diagnosis not present

## 2018-03-11 DIAGNOSIS — J4541 Moderate persistent asthma with (acute) exacerbation: Secondary | ICD-10-CM | POA: Diagnosis not present

## 2018-03-11 DIAGNOSIS — M4690 Unspecified inflammatory spondylopathy, site unspecified: Secondary | ICD-10-CM | POA: Diagnosis not present

## 2018-03-11 DIAGNOSIS — J441 Chronic obstructive pulmonary disease with (acute) exacerbation: Secondary | ICD-10-CM | POA: Diagnosis not present

## 2018-03-11 DIAGNOSIS — H8110 Benign paroxysmal vertigo, unspecified ear: Secondary | ICD-10-CM | POA: Diagnosis not present

## 2018-03-14 DIAGNOSIS — H8110 Benign paroxysmal vertigo, unspecified ear: Secondary | ICD-10-CM | POA: Diagnosis not present

## 2018-03-14 DIAGNOSIS — M4690 Unspecified inflammatory spondylopathy, site unspecified: Secondary | ICD-10-CM | POA: Diagnosis not present

## 2018-03-14 DIAGNOSIS — J4541 Moderate persistent asthma with (acute) exacerbation: Secondary | ICD-10-CM | POA: Diagnosis not present

## 2018-03-14 DIAGNOSIS — J441 Chronic obstructive pulmonary disease with (acute) exacerbation: Secondary | ICD-10-CM | POA: Diagnosis not present

## 2018-03-14 DIAGNOSIS — J209 Acute bronchitis, unspecified: Secondary | ICD-10-CM | POA: Diagnosis not present

## 2018-03-14 DIAGNOSIS — J440 Chronic obstructive pulmonary disease with acute lower respiratory infection: Secondary | ICD-10-CM | POA: Diagnosis not present

## 2018-03-18 DIAGNOSIS — C678 Malignant neoplasm of overlapping sites of bladder: Secondary | ICD-10-CM | POA: Diagnosis not present

## 2018-04-02 ENCOUNTER — Other Ambulatory Visit: Payer: Self-pay | Admitting: Family Medicine

## 2018-04-05 ENCOUNTER — Ambulatory Visit: Payer: Medicare Other | Admitting: Adult Health

## 2018-04-27 NOTE — Progress Notes (Signed)
[place] at [facility] [address], [place], [place] [address] [phone]  Date:  04/28/2018   Name:  Crystal Kerr   DOB:  02-21-1924   MRN:  [id number]  PCP:  [name], MD    Chief Complaint: No chief complaint on file.   History of Present Illness:  Crystal Kerr is a 85 y.o. very pleasant female patient who presents with the following:  Virtual visit today due to COVID-19 pandemic Patient location is home Provider location is office Patient identification confirmed with name and date of birth, patient gives consent for virtual visit today.  Pt and her daughter Crystal Kerr are on the call today   Devika is a very nice 85 year old lady, who lives with her daughter Crystal Kerr. History of A. fib, COPD, interstitial lung disease, hearing loss, diastolic CHF, use of anticoagulation, bladder cancer  Sherby is having periodic bouts of coughing up mucus that can even make her vomit.  She feels like she has PND running into her chest and throat. This has been going on since about February- worse since she is no longer taking steroids  She can somewhat control her sx with an allergy med that she uses  She notes that she has lost weight due to decreased appetitie   Albuterol as needed Dulera Flonase Diltiazem for rate control  Eliquis Lasix 20 as needed They tried adding singulair to her regimen but it did not help   BP Readings from Last 3 Encounters:  03/03/18 120/67  02/23/18 124/66  02/17/18 (!) 158/68   She saw pulmonology in February, at that time they exchanged Symbicort for [facility].  They did use prednisone for a few days due to acute bronchitis at that time   She also saw cardiology in February-they continue Eliquis for atrial fib, amiodarone was stopped due to interstitial lung disease Liridona had been undergoing treatment for bladder cancer, but decided to stop infusions as they seemed  to trigger episodes of COPD exacerbation  She was admitted for 2 days in early February with a COPD exacerbation-may have been triggered by infusion therapy for her bladder cancer I last saw her just after this exacerbation  No history of glaucoma  No urinary retention Patient Active Problem List   Diagnosis Date Noted  . COPD exacerbation ([place]) 02/14/2018  . Acute respiratory failure with hypoxia ([place]) 02/13/2018  . Renal insufficiency 02/10/2018  . Acute bronchitis 01/18/2018  . Pre-operative clearance 12/22/2017  . Chronic diastolic heart failure ([place]) 11/23/2017  . ILD (interstitial lung disease) ([place]) 11/23/2017  . CAP (community acquired pneumonia) 03/04/2017  . Current use of long term anticoagulation 07/31/2016  . Atypical pneumonia 06/02/2016  . History of atrial fibrillation 06/02/2016  . Lower extremity edema 12/19/2013  . 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 Benign paroxysmal positional vertigo 09/13/2013  . Upper airway cough syndrome 07/10/2013  . Allergic rhinitis 07/05/2013  . Persistent atrial fibrillation 03/30/2013  . Shortness of breath 02/06/2013  . Acute on chronic diastolic congestive heart failure ([place]) 02/04/2013  . Arthritis of spine 07/01/2012  . Intrinsic asthma 07/01/2012  . Dyspnea 04/12/2012  . PVC's (premature ventricular contractions) 04/24/2011  . Murmur, cardiac 04/24/2011    Past Medical History:  Diagnosis Date  . Arthritis   . Asthma   . Atrial fibrillation ([place])    a. s/p DCCV in 2015, on Amiodarone and  Eliquis  . CHF (congestive heart failure) ([place])   . COPD (chronic obstructive pulmonary disease) ([place])   . Dysrhythmia    afib -hx   . Neuromuscular disorder ([place])    peripheral neuropathy   . Pneumonia    hx of x 2   . PONV (postoperative nausea and vomiting)   . PVC's (premature ventricular contractions)     Past Surgical History:  Procedure Laterality Date  .  APPENDECTOMY    . CARDIOVERSION N/A 03/08/2013   Procedure: CARDIOVERSION;  Surgeon: Larey Dresser, MD;  Location: [place];  Service: Cardiovascular;  Laterality: N/A;  . CHOLECYSTECTOMY    . oophrectomy    . TEE WITHOUT CARDIOVERSION N/A 03/08/2013   Procedure: TRANSESOPHAGEAL ECHOCARDIOGRAM (TEE);  Surgeon: Larey Dresser, MD;  Location: [place];  Service: Cardiovascular;  Laterality: N/A;  . TONSILLECTOMY    . TRANSURETHRAL RESECTION OF BLADDER TUMOR WITH GYRUS (TURBT-GYRUS)  05/13/2016   urology wiht [facility]  . TRANSURETHRAL RESECTION OF BLADDER TUMOR WITH MITOMYCIN-C N/A 12/24/2017   Procedure: TRANSURETHRAL RESECTION OF BLADDER TUMOR;  Surgeon: Lucas Mallow, MD;  Location: [facility];  Service: Urology;  Laterality: N/A;    Social History   Tobacco Use  . Smoking status: Former Smoker    Packs/day: 1.00    Years: 20.00    Pack years: 20.00    Types: Cigarettes    Last attempt to quit: 04/13/1981    Years since quitting: 37.0  . Smokeless tobacco: Never Used  Substance Use Topics  . Alcohol use: Yes    Comment: Occasional  . Drug use: No    Family History  Problem Relation Age of Onset  . Heart failure Mother   . Diabetes Father   . Heart disease Other        No family history    No Known Allergies  Medication list has been reviewed and updated.  Current Outpatient Medications on File Prior to Visit  Medication Sig Dispense Refill  . acetaminophen (TYLENOL) 325 MG tablet Take 2 tablets (650 mg total) by mouth every 6 (six) hours as needed for mild pain or headache. 30 tablet 0  . albuterol (PROAIR HFA) 108 (90 Base) MCG/ACT inhaler Inhale 2 puffs into the lungs every 4 (four) hours as needed for wheezing or shortness of breath. 18 g 11  . benzonatate (TESSALON) 100 MG capsule Take 1 capsule (100 mg total) by mouth 3 (three) times daily as needed for cough. 40 capsule 0  . budesonide-formoterol (SYMBICORT) 160-4.5 MCG/ACT inhaler Inhale 2 puffs into the  lungs 2 (two) times daily. 10.2 Inhaler 4  . diltiazem (TIAZAC) 120 MG 24 hr capsule TAKE 1 CAPSULE BY MOUTH DAILY (Patient taking differently: Take 120 mg by mouth daily. ) 90 capsule 3  . ELIQUIS 5 MG TABS tablet TAKE 1 TABLET(5 MG) BY MOUTH TWICE DAILY 180 tablet 1  . fluticasone (FLONASE) 50 MCG/ACT nasal spray Place 1 spray into both nostrils daily as needed for allergies. 16 g 0  . furosemide (LASIX) 20 MG tablet Take 1 tablet (20 mg total) by mouth daily. Use as needed for swelling (Patient taking differently: Take 20 mg by mouth daily as needed for fluid. Use as needed for swelling) 90 tablet 3  . guaiFENesin (MUCINEX) 600 MG 12 hr tablet Take 1 tablet (600 mg total) by mouth 2 (two) times daily as needed for cough. 20 tablet 0  . mirabegron ER (MYRBETRIQ) 25 MG TB24 tablet Take 1 tablet (  25 mg total) by mouth daily. Use for bladder symptoms 30 tablet 6  . mometasone-formoterol (DULERA) 200-5 MCG/ACT AERO Inhale 2 puffs into the lungs 2 (two) times daily. 1 Inhaler 5  . montelukast (SINGULAIR) 10 MG tablet TAKE 1 TABLET BY MOUTH DAILY 90 tablet 1  . Multiple Vitamins-Minerals (MULTIVITAMIN WITH MINERALS) tablet Take 1 tablet by mouth daily.    . pantoprazole (PROTONIX) 40 MG tablet Take 1 tablet (40 mg total) by mouth daily before breakfast. 90 tablet 3  . traMADol (ULTRAM) 50 MG tablet TAKE 1/2 TO 1 TABLET BY MOUTH EVERY 8 HOURS AS NEEDED FOR HIP PAIN (Patient taking differently: Take 50 mg by mouth every 8 (eight) hours as needed for moderate pain. TAKE 1/2 TO 1 TABLET BY MOUTH EVERY 8 HOURS AS NEEDED FOR HIP PAIN) 60 tablet 0   No current facility-administered medications on file prior to visit.     Review of Systems:  No fever or chills Cough is better right now Besides her PND she is feeling ok   Physical Examination: There were no vitals filed for this visit. There were no vitals filed for this visit. There is no height or weight on file to calculate BMI. Ideal Body Weight:     Pt observed over camera today- she looks her normal self No cough, wheezing or distress is observed today    Assessment and Plan: PND (post-nasal drip) - Plan: ipratropium (ATROVENT) 0.03 % nasal spray  Artisha has noted apparent postnasal drip which can trigger coughing and even vomiting for the last couple of months.  Otherwise she is feeling okay right now No history of glaucoma or urinary retention We will have her try Atrovent nasal spray for this problem.  I have asked her let me know if this is not helpful Invited and answered all questions from patient and her daughter today MyChart message sent for follow-up  Signed Lamar Blinks, MD

## 2018-04-28 ENCOUNTER — Other Ambulatory Visit: Payer: Self-pay

## 2018-04-28 ENCOUNTER — Encounter: Payer: Self-pay | Admitting: Family Medicine

## 2018-04-28 ENCOUNTER — Ambulatory Visit (INDEPENDENT_AMBULATORY_CARE_PROVIDER_SITE_OTHER): Payer: Medicare Other | Admitting: Family Medicine

## 2018-04-28 DIAGNOSIS — R0982 Postnasal drip: Secondary | ICD-10-CM | POA: Diagnosis not present

## 2018-04-28 MED ORDER — IPRATROPIUM BROMIDE 0.03 % NA SOLN: 2.0000 | Freq: Four times a day (QID) | NASAL | 6 refills | Status: DC

## 2018-04-28 NOTE — Patient Instructions (Addendum)
It was great to talk with you today as always!  I sent in an rx for atrovent nasal spray for you to try- this is meant to dry out mucus going down your throat.  I hope that this will help you but please let me know if not improved!

## 2018-05-03 ENCOUNTER — Ambulatory Visit: Payer: Medicare Other | Admitting: Adult Health

## 2018-05-06 ENCOUNTER — Other Ambulatory Visit: Payer: Self-pay | Admitting: Cardiology

## 2018-05-06 NOTE — Telephone Encounter (Signed)
Diltiazem ER 120 mg refilled.

## 2018-05-30 ENCOUNTER — Telehealth: Payer: Self-pay | Admitting: *Deleted

## 2018-05-30 NOTE — Telephone Encounter (Signed)
A message was left, re: follow up visit. 

## 2018-06-16 DIAGNOSIS — C678 Malignant neoplasm of overlapping sites of bladder: Secondary | ICD-10-CM | POA: Diagnosis not present

## 2018-06-28 NOTE — Progress Notes (Signed)
Virtual Visit via Video Note   This visit type was conducted due to national recommendations for restrictions regarding the COVID-19 Pandemic (e.g. social distancing) in an effort to limit this patient's exposure and mitigate transmission in our community.  Due to her co-morbid illnesses, this patient is at least at moderate risk for complications without adequate follow up.  This format is felt to be most appropriate for this patient at this time.  All issues noted in this document were discussed and addressed.  A limited physical exam was performed with this format.  Please refer to the patient's chart for her consent to telehealth for [facility].   Date:  06/29/2018   ID:  Crystal Kerr, DOB 1924/06/02, MRN [id number]  Patient Location: Home Provider Location: Home  PCP:  [name], MD  Cardiologist:  Kirk Ruths, MD   Evaluation Performed:  Follow-Up Visit  Chief Complaint:  Atrial fibrillation  History of Present Illness:    FU atrial fibrillation. Patient had episode of atrial fibrillation in February 2015 in the setting of viral gastroenteritis. She had a TEE guided cardioversion. TEE February 2015showed normal LV function, lambl'sexcrescence, mild to moderate left atrial enlargement.At previous office visit she was noted to have increasing dyspnea. There was a question of pneumonia on chest x-ray and antibiotics were added. Amiodarone discontinued as well.  Significant improvement in densities with antibiotics. High-resolution CT August 2019 showed findings most likely favoring chronic hypersensitivity pneumonitis. Follow-up recommended 12 months. Cardioversion of atrial fibrillation January 2020.  Echocardiogram repeated February 2020.  Normal LV function, mild left atrial enlargement.   Chest CT February 2020 showed no pulmonary embolus.  There was increase in size of right upper pole kidney cyst possibly complicated by interval hemorrhage.  Follow-up  recommended with and without contrast or MRI.  Since she was last seenthere is no dyspnea, chest pain, palpitations, syncope or bleeding.  She has mild bilateral pedal edema which is unchanged and she takes Lasix as needed for this.  No falls.  The patient does not have symptoms concerning for COVID-19 infection (fever, chills, cough, or new shortness of breath).    Past Medical History:  Diagnosis Date   Arthritis    Asthma    Atrial fibrillation ([place])    a. s/p DCCV in 2015, on Amiodarone and Eliquis   CHF (congestive heart failure) ([facility])    COPD (chronic obstructive pulmonary disease) ([facility])    Dysrhythmia    afib -hx    Neuromuscular disorder ([facility])    peripheral neuropathy    Pneumonia    hx of x 2    PONV (postoperative nausea and vomiting)    PVC's (premature ventricular contractions)    Past Surgical History:  Procedure Laterality Date   APPENDECTOMY     CARDIOVERSION N/A 03/08/2013   Procedure: CARDIOVERSION;  Surgeon: Larey Dresser, MD;  Location: [place];  Service: Cardiovascular;  Laterality: N/A;   CHOLECYSTECTOMY     oophrectomy     TEE WITHOUT CARDIOVERSION N/A 03/08/2013   Procedure: TRANSESOPHAGEAL ECHOCARDIOGRAM (TEE);  Surgeon: Larey Dresser, MD;  Location: [place];  Service: Cardiovascular;  Laterality: N/A;   TONSILLECTOMY     TRANSURETHRAL RESECTION OF BLADDER TUMOR WITH GYRUS (TURBT-GYRUS)  05/13/2016   urology wiht [facility]   TRANSURETHRAL RESECTION OF BLADDER TUMOR WITH MITOMYCIN-C N/A 12/24/2017   Procedure: TRANSURETHRAL RESECTION OF BLADDER TUMOR;  Surgeon: Lucas Mallow, MD;  [facility];  Service: Urology;  Laterality: N/A;  Current Meds  Medication Sig   acetaminophen (TYLENOL) 325 MG tablet Take 2 tablets (650 mg total) by mouth every 6 (six) hours as needed for mild pain or headache.   albuterol (PROAIR HFA) 108 (90 Base) MCG/ACT inhaler Inhale 2 puffs into the lungs every 4 (four) hours as needed for  wheezing or shortness of breath.   benzonatate (TESSALON) 100 MG capsule Take 1 capsule (100 mg total) by mouth 3 (three) times daily as needed for cough.   budesonide-formoterol (SYMBICORT) 160-4.5 MCG/ACT inhaler Inhale 2 puffs into the lungs 2 (two) times daily.   diltiazem (TIAZAC) 120 MG 24 hr capsule TAKE 1 CAPSULE BY MOUTH DAILY   ELIQUIS 5 MG TABS tablet TAKE 1 TABLET(5 MG) BY MOUTH TWICE DAILY   fluticasone (FLONASE) 50 MCG/ACT nasal spray Place 1 spray into both nostrils daily as needed for allergies.   furosemide (LASIX) 20 MG tablet Take 1 tablet (20 mg total) by mouth daily. Use as needed for swelling (Patient taking differently: Take 20 mg by mouth daily as needed for fluid. Use as needed for swelling)   guaiFENesin (MUCINEX) 600 MG 12 hr tablet Take 1 tablet (600 mg total) by mouth 2 (two) times daily as needed for cough.   ipratropium (ATROVENT) 0.03 % nasal spray Place 2 sprays into the nose 4 (four) times daily. Use as needed for post nasal drip   mirabegron ER (MYRBETRIQ) 25 MG TB24 tablet Take 1 tablet (25 mg total) by mouth daily. Use for bladder symptoms   mometasone-formoterol (DULERA) 200-5 MCG/ACT AERO Inhale 2 puffs into the lungs 2 (two) times daily.   montelukast (SINGULAIR) 10 MG tablet TAKE 1 TABLET BY MOUTH DAILY   Multiple Vitamins-Minerals (MULTIVITAMIN WITH MINERALS) tablet Take 1 tablet by mouth daily.   pantoprazole (PROTONIX) 40 MG tablet Take 1 tablet (40 mg total) by mouth daily before breakfast.   traMADol (ULTRAM) 50 MG tablet TAKE 1/2 TO 1 TABLET BY MOUTH EVERY 8 HOURS AS NEEDED FOR HIP PAIN     Allergies:   Patient has no known allergies.   Social History   Tobacco Use   Smoking status: Former Smoker    Packs/day: 1.00    Years: 20.00    Pack years: 20.00    Types: Cigarettes    Quit date: 04/13/1981    Years since quitting: 37.2   Smokeless tobacco: Never Used  Substance Use Topics   Alcohol use: Yes    Comment: Occasional    Drug use: No     Family Hx: The patient's family history includes Diabetes in her father; Heart disease in an other family member; Heart failure in her mother.  ROS:   Please see the history of present illness.    No fevers, chills or productive cough. All other systems reviewed and are negative.  Recent Labs: 02/13/2018: B Natriuretic Peptide 74.2 02/15/2018: ALT 22 02/17/2018: BUN 24; Creatinine, Ser 1.06; Hemoglobin 9.9; Platelets 369.0; Potassium 4.5; Sodium 140   Recent Lipid Panel Lab Results  Component Value Date/Time   CHOL 228 (H) 04/22/2011 12:00 AM   TRIG 157 (H) 04/22/2011 12:00 AM   HDL 50 04/22/2011 12:00 AM   CHOLHDL 4.6 04/22/2011 12:00 AM   LDLCALC 147 (H) 04/22/2011 12:00 AM    Wt Readings from Last 3 Encounters:  06/29/18 158 lb (71.7 kg)  03/03/18 170 lb 12.8 oz (77.5 kg)  02/23/18 169 lb (76.7 kg)     Objective:    Vital Signs:  Temp Marland Kitchen)  95.5 F (35.3 C)    Ht 5\' 3"  (1.6 m)    Wt 158 lb (71.7 kg)    BMI 27.99 kg/m    VITAL SIGNS:  reviewed  No acute distress Normal affect Remainder physical examination not performed (telehealth visit; coronavirus pandemic)  ASSESSMENT & PLAN:    1. Paroxysmal atrial fibrillation-by history patient remains in sinus rhythm.  Amiodarone was discontinued previously because of question of pulmonary toxicity.  However this may have been interstitial lung disease.  Would still like to avoid long-term.  Continue Cardizem for rate control if atrial fibrillation recurs.  Continue apixaban. 2. Hypertension-patient's blood pressure is controlled.  Continue present medications and follow. 3. Chronic diastolic congestive heart failure-based on history she appears to be euvolemic.  Continue present dose of diuretic.  I discussed keeping her feet elevated for lower extremity edema.  We also discussed fluid restriction and low-sodium diet. 4. Chronic interstitial lung disease-followed by pulmonary.  Follow-up chest CT was recommended  August 2020.  I asked patient and daughter to discuss this with pulmonary. 5. Kidney cysts-follow-up CT or MRI recommended.  Patient is followed by urology and I asked him to discuss this further with their urologist.  COVID-19 Education: The importance of social distancing was discussed today.  Time:   Today, I have spent 17 minutes with the patient with telehealth technology discussing the above problems.     Medication Adjustments/Labs and Tests Ordered: Current medicines are reviewed at length with the patient today.  Concerns regarding medicines are outlined above.   Tests Ordered: No orders of the defined types were placed in this encounter.   Medication Changes: No orders of the defined types were placed in this encounter.   Follow Up:  Virtual Visit or In Person in 6 month(s)  Signed, Kirk Ruths, MD  06/29/2018 8:26 AM    [place]

## 2018-06-29 ENCOUNTER — Encounter: Payer: Self-pay | Admitting: Cardiology

## 2018-06-29 ENCOUNTER — Other Ambulatory Visit: Payer: Self-pay

## 2018-06-29 ENCOUNTER — Telehealth (INDEPENDENT_AMBULATORY_CARE_PROVIDER_SITE_OTHER): Payer: Medicare Other | Admitting: Cardiology

## 2018-06-29 VITALS — Temp 95.5°F | Ht 63.0 in | Wt 158.0 lb

## 2018-06-29 DIAGNOSIS — I480 Paroxysmal atrial fibrillation: Secondary | ICD-10-CM

## 2018-06-29 DIAGNOSIS — I5032 Chronic diastolic (congestive) heart failure: Secondary | ICD-10-CM

## 2018-06-29 DIAGNOSIS — I10 Essential (primary) hypertension: Secondary | ICD-10-CM

## 2018-06-29 NOTE — Patient Instructions (Signed)
Medication Instructions:  NO CHANGE If you need a refill on your cardiac medications before your next appointment, please call your pharmacy.   Lab work: If you have labs (blood work) drawn today and your tests are completely normal, you will receive your results only by: . MyChart Message (if you have MyChart) OR . A paper copy in the mail If you have any lab test that is abnormal or we need to change your treatment, we will call you to review the results.  Follow-Up: Your physician wants you to follow-up in: 6 MONTHS WITH DR CRENSHAW You will receive a reminder letter in the mail two months in advance. If you don't receive a letter, please call our office to schedule the follow-up appointment.    

## 2018-07-01 ENCOUNTER — Ambulatory Visit (INDEPENDENT_AMBULATORY_CARE_PROVIDER_SITE_OTHER): Payer: Medicare Other | Admitting: Family Medicine

## 2018-07-01 ENCOUNTER — Other Ambulatory Visit: Payer: Self-pay

## 2018-07-01 ENCOUNTER — Encounter: Payer: Self-pay | Admitting: Family Medicine

## 2018-07-01 VITALS — BP 112/80 | HR 97 | Temp 98.2°F | Ht 63.0 in | Wt 161.0 lb

## 2018-07-01 DIAGNOSIS — N281 Cyst of kidney, acquired: Secondary | ICD-10-CM

## 2018-07-01 DIAGNOSIS — M25552 Pain in left hip: Secondary | ICD-10-CM | POA: Diagnosis not present

## 2018-07-01 MED ORDER — TRAMADOL HCL 50 MG PO TABS: ORAL_TABLET | ORAL | 0 refills | Status: DC

## 2018-07-01 MED ORDER — TIZANIDINE HCL 2 MG PO TABS: 2.0000 mg | ORAL_TABLET | Freq: Three times a day (TID) | ORAL | 0 refills | Status: DC | PRN

## 2018-07-01 NOTE — Progress Notes (Signed)
Musculoskeletal Exam  Patient: Crystal Kerr DOB: 1924/01/13  DOS: 07/01/2018  SUBJECTIVE:  Chief Complaint:   Chief Complaint  Patient presents with  . Groin Pain    Crystal Kerr is a 85 y.o.  female for evaluation and treatment of L hip pain. Here w daughter.   Onset:  2 weeks ago. May have strained when she was getting into bed.  Location: L hip pain Character:  sharp  Progression of issue:  Waxing and waning Associated symptoms: certain movements cause the pain Treatment: to date has been acetaminophen and tramadol.   Neurovascular symptoms: no +famhx of OA.   Had hosp stay in Feb. Was told she would need to f/u with a renal lesion. Read shows an increase in size and attenuation of R upper pole kidney cyst. Pt reports doing fine, daughter is concerned though.   ROS: Musculoskeletal/Extremities: +L hip pain Neuro: No new weakness or tingling/numbness  Past Medical History:  Diagnosis Date  . Arthritis   . Asthma   . Atrial fibrillation ([place])    a. s/p DCCV in 2015, on Amiodarone and Eliquis  . CHF (congestive heart failure) ([place])   . COPD (chronic obstructive pulmonary disease) ([place])   . Dysrhythmia    afib -hx   . Neuromuscular disorder ([place])    peripheral neuropathy   . Pneumonia    hx of x 2   . PONV (postoperative nausea and vomiting)   . PVC's (premature ventricular contractions)     Objective: VITAL SIGNS: BP 112/80 (BP Location: Left Arm, Patient Position: Sitting, Cuff Size: Normal)   Pulse 97   Temp 98.2 F (36.8 C) (Oral)   Ht 5\' 3"  (1.6 m)   Wt 161 lb (73 kg)   SpO2 96%   BMI 28.52 kg/m  Constitutional: Well formed, well developed. No acute distress. Cardiovascular: Brisk cap refill Thorax & Lungs: No accessory muscle use Musculoskeletal: L hip.   Normal active range of motion: yes.   Normal passive range of motion: yes Tenderness to palpation: yes over hip flexors Deformity: no Ecchymosis: no Tests positive:  [place], log roll equivocal Tests negative: FABER, FADDIR Neurologic: Normal sensory function. No focal deficits noted. Antalgic gait.  Psychiatric: Normal mood. Age appropriate judgment and insight. Alert & oriented x 3.    Assessment:  Pain of left hip joint - Plan: tiZANidine (ZANAFLEX) 2 MG tablet, traMADol (ULTRAM) 50 MG tablet,heat, ice, stretches/exercises. Offered XR, declined at this time.   Acquired cyst of kidney - Plan: CT ABDOMEN W WO CONTRAST, reck.   Plan: Orders as above. Will consider XR in future if no improvement vs PT. F/u in 4 weeks w [name]. The patient voiced understanding and agreement to the plan.   [place], DO 07/01/18  11:31 AM

## 2018-07-01 NOTE — Patient Instructions (Addendum)
Heat (pad or rice pillow in microwave) over affected area, 10-15 minutes twice daily.   Ice/cold pack over area for 10-15 min twice daily.  OK to take Tylenol 1000 mg (2 extra strength tabs) or 975 mg (3 regular strength tabs) every 6 hours as needed.  Hip Exercises It is normal to feel mild stretching, pulling, tightness, or discomfort as you do these exercises, but you should stop right away if you feel sudden pain or your pain gets worse.  STRETCHING AND RANGE OF MOTION EXERCISES These exercises warm up your muscles and joints and improve the movement and flexibility of your hip. These exercises also help to relieve pain, numbness, and tingling. Exercise A: Hamstrings, Supine  1. Lie on your back. 2. Loop a belt or towel over the ball of your left / rightfoot. The ball of your foot is on the walking surface, right under your toes. 3. Straighten your left / rightknee and slowly pull on the belt to raise your leg. ? Do not let your left / right knee bend while you do this. ? Keep your other leg flat on the floor. ? Raise the left / right leg until you feel a gentle stretch behind your left / right knee or thigh. 4. Hold this position for 30 seconds. 5. Slowly return your leg to the starting position. Repeat2 times. Complete this stretch 3 times per week. Exercise B: Hip Rotators  1. Lie on your back on a firm surface. 2. Hold your left / right knee with your left / right hand. Hold your ankle with your other hand. 3. Gently pull your left / right knee and rotate your lower leg toward your other shoulder. ? Pull until you feel a stretch in your buttocks. ? Keep your hips and shoulders firmly planted while you do this stretch. 4. Hold this position for 30 seconds. Repeat 2 times. Complete this stretch 3 times per week. Exercise C: V-Sit (Hamstrings and Adductors)  1. Sit on the floor with your legs extended in a large "V" shape. Keep your knees straight during this  exercise. 2. Start with your head and chest upright, then bend at your waist to reach for your left foot (position A). You should feel a stretch in your right inner thigh. 3. Hold this position for 30 seconds. Then slowly return to the upright position. 4. Bend at your waist to reach forward (position B). You should feel a stretch behind both of your thighs and knees. 5. Hold this position for 30 seconds. Then slowly return to the upright position. 6. Bend at your waist to reach for your right foot (position C). You should feel a stretch in your left inner thigh. 7. Hold this position for 30 seconds. Then slowly return to the upright position. Repeat A, B, and C 2 times each. Complete this stretch 3 times per week. Exercise D: Lunge (Hip Flexors)  1. Place your left / right knee on the floor and bend your other knee so that is directly over your ankle. You should be half-kneeling. 2. Keep good posture with your head over your shoulders. 3. Tighten your buttocks to point your tailbone downward. This helps your back to keep from arching too much. 4. You should feel a gentle stretch in the front of your left / right thigh and hip. If you do not feel any resistance, slightly slide your other foot forward and then slowly lunge forward so your knee once again lines up over your ankle.  5. Make sure your tailbone continues to point downward. 6. Hold this position for 30 seconds. Repeat 2 times. Complete this stretch 3 times per week.  STRENGTHENING EXERCISES These exercises build strength and endurance in your hip. Endurance is the ability to use your muscles for a long time, even after they get tired. Exercise E: Bridge (Hip Extensors)  1. Lie on your back on a firm surface with your knees bent and your feet flat on the floor. 2. Tighten your buttocks muscles and lift your bottom off the floor until the trunk of your body is level with your thighs. ? Do not arch your back. ? You should feel the  muscles working in your buttocks and the back of your thighs. If you do not feel these muscles, slide your feet 1-2 inches (2.5-5 cm) farther away from your buttocks. 3. Hold this position for 3 seconds. 4. Slowly lower your hips to the starting position. Repeat for a total of 10 repetitions. 5. Let your muscles relax completely between repetitions. 6. If this exercise is too easy, try doing it with your arms crossed over your chest. Repeat 2 times. Complete this exercise 3 times per week. Exercise F: Straight Leg Raises - Hip Abductors  1. Lie on your side with your left / right leg in the top position. Lie so your head, shoulder, knee, and hip line up with each other. You may bend your bottom knee to help you balance. 2. Roll your hips slightly forward, so your hips are stacked directly over each other and your left / right knee is facing forward. 3. Leading with your heel, lift your top leg 4-6 inches (10-15 cm). You should feel the muscles in your outer hip lifting. ? Do not let your foot drift forward. ? Do not let your knee roll toward the ceiling. 4. Hold this position for 1 second. 5. Slowly return to the starting position. 6. Let your muscles relax completely between repetitions. Repeat for a total of 10 repetitions.  Repeat 2 times. Complete this exercise 3 times per week. Exercise G: Straight Leg Raises - Hip Adductors  1. Lie on your side with your left / right leg in the bottom position. Lie so your head, shoulder, knee, and hip line up. You may place your upper foot in front to help you balance. 2. Roll your hips slightly forward, so your hips are stacked directly over each other and your left / right knee is facing forward. 3. Tense the muscles in your inner thigh and lift your bottom leg 4-6 inches (10-15 cm). 4. Hold this position for 1 second. 5. Slowly return to the starting position. 6. Let your muscles relax completely between repetitions. Repeat for a total of 10  repetitions. Repeat 2 times. Complete this exercise 3 times per week. Exercise H: Straight Leg Raises - Quadriceps  1. Lie on your back with your left / right leg extended and your other knee bent. 2. Tense the muscles in the front of your left / right thigh. When you do this, you should see your kneecap slide up or see increased dimpling just above your knee. 3. Tighten these muscles even more and raise your leg 4-6 inches (10-15 cm) off the floor. 4. Hold this position for 3 seconds. 5. Keep these muscles tense as you lower your leg. 6. Relax the muscles slowly and completely between repetitions. Repeat for a total of 10 repetitions. Repeat 2 times. Complete this exercise 3 times per week.  Exercise I: Hip Abductors, Standing 1. Tie one end of a rubber exercise band or tubing to a secure surface, such as a table or pole. 2. Loop the other end of the band or tubing around your left / right ankle. 3. Keeping your ankle with the band or tubing directly opposite of the secured end, step away until there is tension in the tubing or band. Hold onto a chair as needed for balance. 4. Lift your left / right leg out to your side. While you do this: ? Keep your back upright. ? Keep your shoulders over your hips. ? Keep your toes pointing forward. ? Make sure to use your hip muscles to lift your leg. Do not "throw" your leg or tip your body to lift your leg. 5. Hold this position for 1 second. 6. Slowly return to the starting position. Repeat for a total of 10 repetitions. Repeat 2 times. Complete this exercise 3 times per week. Exercise J: Squats (Quadriceps) 1. Stand in a door frame so your feet and knees are in line with the frame. You may place your hands on the frame for balance. 2. Slowly bend your knees and lower your hips like you are going to sit in a chair. ? Keep your lower legs in a straight-up-and-down position. ? Do not let your hips go lower than your knees. ? Do not bend your knees  lower than told by your health care provider. ? If your hip pain increases, do not bend as low. 3. Hold this position for 1 second. 4. Slowly push with your legs to return to standing. Do not use your hands to pull yourself to standing. Repeat for a total of 10 repetitions. Repeat 2 times. Complete this exercise 3 times per week. Make sure you discuss any questions you have with your health care provider. Document Released: 01/16/2005 Document Revised: 09/23/2015 Document Reviewed: 12/24/2014 Elsevier Interactive Patient Education  [employer].

## 2018-07-08 ENCOUNTER — Other Ambulatory Visit: Payer: Self-pay

## 2018-07-08 ENCOUNTER — Emergency Department (HOSPITAL_BASED_OUTPATIENT_CLINIC_OR_DEPARTMENT_OTHER)
Admission: EM | Admit: 2018-07-08 | Discharge: 2018-07-08 | Disposition: A | Discharge status: Home / Self Care | Payer: Medicare Other | Attending: Emergency Medicine | Admitting: Emergency Medicine

## 2018-07-08 ENCOUNTER — Encounter (HOSPITAL_BASED_OUTPATIENT_CLINIC_OR_DEPARTMENT_OTHER): Payer: Self-pay | Admitting: *Deleted

## 2018-07-08 DIAGNOSIS — I4891 Unspecified atrial fibrillation: Secondary | ICD-10-CM

## 2018-07-08 DIAGNOSIS — I5032 Chronic diastolic (congestive) heart failure: Secondary | ICD-10-CM | POA: Diagnosis not present

## 2018-07-08 DIAGNOSIS — I499 Cardiac arrhythmia, unspecified: Secondary | ICD-10-CM | POA: Diagnosis present

## 2018-07-08 DIAGNOSIS — Z87891 Personal history of nicotine dependence: Secondary | ICD-10-CM | POA: Diagnosis not present

## 2018-07-08 DIAGNOSIS — J449 Chronic obstructive pulmonary disease, unspecified: Secondary | ICD-10-CM | POA: Diagnosis not present

## 2018-07-08 DIAGNOSIS — Z79899 Other long term (current) drug therapy: Secondary | ICD-10-CM | POA: Insufficient documentation

## 2018-07-08 LAB — CBC WITH DIFFERENTIAL/PLATELET
Abs Immature Granulocytes: 0.05 10*3/uL (ref 0.00–0.07)
Basophils Absolute: 0.1 10*3/uL (ref 0.0–0.1)
Basophils Relative: 1 %
Eosinophils Absolute: 0 10*3/uL (ref 0.0–0.5)
Eosinophils Relative: 0 %
HCT: 37.8 % (ref 36.0–46.0)
Hemoglobin: 11.4 g/dL — ABNORMAL LOW (ref 12.0–15.0)
Immature Granulocytes: 1 %
Lymphocytes Relative: 18 %
Lymphs Abs: 1.8 10*3/uL (ref 0.7–4.0)
MCH: 25.4 pg — ABNORMAL LOW (ref 26.0–34.0)
MCHC: 30.2 g/dL (ref 30.0–36.0)
MCV: 84.4 fL (ref 80.0–100.0)
Monocytes Absolute: 0.8 10*3/uL (ref 0.1–1.0)
Monocytes Relative: 8 %
Neutro Abs: 7 10*3/uL (ref 1.7–7.7)
Neutrophils Relative %: 72 %
Platelets: 319 10*3/uL (ref 150–400)
RBC: 4.48 MIL/uL (ref 3.87–5.11)
RDW: 20.6 % — ABNORMAL HIGH (ref 11.5–15.5)
WBC: 9.7 10*3/uL (ref 4.0–10.5)
nRBC: 0 % (ref 0.0–0.2)

## 2018-07-08 LAB — BASIC METABOLIC PANEL
Anion gap: 9 (ref 5–15)
BUN: 26 mg/dL — ABNORMAL HIGH (ref 8–23)
CO2: 26 mmol/L (ref 22–32)
Calcium: 9.1 mg/dL (ref 8.9–10.3)
Chloride: 101 mmol/L (ref 98–111)
Creatinine, Ser: 1.17 mg/dL — ABNORMAL HIGH (ref 0.44–1.00)
GFR calc Af Amer: 47 mL/min — ABNORMAL LOW (ref >60)
GFR calc non Af Amer: 41 mL/min — ABNORMAL LOW (ref >60)
Glucose, Bld: 204 mg/dL — ABNORMAL HIGH (ref 70–99)
Potassium: 3.7 mmol/L (ref 3.5–5.1)
Sodium: 136 mmol/L (ref 135–145)

## 2018-07-08 LAB — MAGNESIUM: Magnesium: 2 mg/dL (ref 1.7–2.4)

## 2018-07-08 MED ORDER — SODIUM CHLORIDE 0.9% FLUSH
3.0000 mL | INTRAVENOUS | Status: DC | PRN
  Filled 2018-07-08: qty 3

## 2018-07-08 MED ORDER — ETOMIDATE 2 MG/ML IV SOLN
12.0000 mg | Freq: Once | INTRAVENOUS | Status: AC
  Administered 2018-07-08: 12 mg via INTRAVENOUS
  Filled 2018-07-08: qty 10

## 2018-07-08 MED ORDER — SODIUM CHLORIDE 0.9% FLUSH
3.0000 mL | Freq: Two times a day (BID) | INTRAVENOUS | Status: DC
  Filled 2018-07-08: qty 3

## 2018-07-08 MED ORDER — SODIUM CHLORIDE 0.9 % IV SOLN
250.0000 mL | INTRAVENOUS | Status: DC
  Administered 2018-07-08: 15:00:00 250 mL via INTRAVENOUS

## 2018-07-08 NOTE — ED Triage Notes (Signed)
Hx of atrial fib. States this am she started feeling like her heart beat was irregular. Denies pain.

## 2018-07-08 NOTE — ED Provider Notes (Signed)
  Physical Exam  BP 130/65   Pulse 65   Temp 98.3 F (36.8 C) (Oral)   Resp 19   Ht 5' 3.5" (1.613 m)   Wt 71.7 kg   SpO2 100%   BMI 27.55 kg/m   Physical Exam  ED Course/Procedures     Procedures  MDM  Care of patient from Dr. Wilson Singer.  Please see his note for prior history, physical and care.  Briefly this is a 85 year old female who presented with concern for atrial fibrillation and has been cardioverted by Dr. Wilson Singer.  She is currently awaiting waking from sedation.   Patient remains in sinus rhythm on my reevaluation, reports she feels great. No CP/dyspnea/neuro symptoms. Plan to follow up with outpt Cardiology.      Gareth Morgan, MD 07/09/18 0002

## 2018-07-08 NOTE — ED Provider Notes (Signed)
[facility] Provider Note   CSN: [id number] Arrival date & time: 07/08/18  1255     History   Chief Complaint Chief Complaint  Patient presents with  . Irregular Heart Beat    HPI Crystal Kerr is a 85 y.o. female.     HPI   85 year old female with vague sensation that something is just not right.  She has a past history of atrial fibrillation is concerned that she may have gone back into it.  Her daughter checked her pulse at home and noted it to be irregular.  She denies any pain.  No dyspnea.  She reports compliance with her medications.  She is on Eliquis and has not missed any doses "for years."  Her cardiologist is [name].  Previous cardioversion in the emergency room this past January which she tolerated well.  Past Medical History:  Diagnosis Date  . Arthritis   . Asthma   . Atrial fibrillation ([place])    a. s/p DCCV in 2015, on Amiodarone and Eliquis  . CHF (congestive heart failure) ([place])   . COPD (chronic obstructive pulmonary disease) ([place])   . Dysrhythmia    afib -hx   . Neuromuscular disorder ([place])    peripheral neuropathy   . Pneumonia    hx of x 2   . PONV (postoperative nausea and vomiting)   . PVC's (premature ventricular contractions)     Patient Active Problem List   Diagnosis Date Noted  . COPD exacerbation ([place]) 02/14/2018  . Acute respiratory failure with hypoxia ([place]) 02/13/2018  . Renal insufficiency 02/10/2018  . Acute bronchitis 01/18/2018  . Pre-operative clearance 12/22/2017  . Chronic diastolic heart failure ([place]) 11/23/2017  . ILD (interstitial lung disease) ([place]) 11/23/2017  . CAP (community acquired pneumonia) 03/04/2017  . Current use of long term anticoagulation 07/31/2016  . Atypical pneumonia 06/02/2016  . History of atrial fibrillation 06/02/2016  . Lower extremity edema 12/19/2013  . 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 Benign paroxysmal positional vertigo 09/13/2013  . Upper airway cough syndrome 07/10/2013  . Allergic rhinitis 07/05/2013  . Persistent atrial fibrillation 03/30/2013  . Shortness of breath 02/06/2013  . Acute on chronic diastolic congestive heart failure ([place]) 02/04/2013  . Arthritis of spine 07/01/2012  . Intrinsic asthma 07/01/2012  . Dyspnea 04/12/2012  . PVC's (premature ventricular contractions) 04/24/2011  . Murmur, cardiac 04/24/2011    Past Surgical History:  Procedure Laterality Date  . APPENDECTOMY    . CARDIOVERSION N/A 03/08/2013   Procedure: CARDIOVERSION;  Surgeon: Larey Dresser, MD;  Location: [facility];  Service: Cardiovascular;  Laterality: N/A;  . CHOLECYSTECTOMY    . oophrectomy    . TEE WITHOUT CARDIOVERSION N/A 03/08/2013   Procedure: TRANSESOPHAGEAL ECHOCARDIOGRAM (TEE);  Surgeon: Larey Dresser, MD;  Location: [facility];  Service: Cardiovascular;  Laterality: N/A;  . TONSILLECTOMY    . TRANSURETHRAL RESECTION OF BLADDER TUMOR WITH GYRUS (TURBT-GYRUS)  05/13/2016   urology wiht [facility]  . TRANSURETHRAL RESECTION OF BLADDER TUMOR WITH MITOMYCIN-C N/A 12/24/2017   Procedure: TRANSURETHRAL RESECTION OF BLADDER TUMOR;  Surgeon: Lucas Mallow, MD;  Location: [facility];  Service: Urology;  Laterality: N/A;     OB History   No obstetric history on file.      Home Medications    Prior to Admission medications   Medication Sig Start Date End Date Taking? Authorizing Provider  acetaminophen (  TYLENOL) 325 MG tablet Take 2 tablets (650 mg total) by mouth every 6 (six) hours as needed for mild pain or headache. 06/05/16   Raiford Noble Latif, DO  albuterol [facility] HFA) 108 [phone] Base) MCG/ACT inhaler Inhale 2 puffs into the lungs every 4 (four) hours as needed for wheezing or shortness of breath. 02/15/18   Reyne Dumas, MD  benzonatate (TESSALON) 100 MG capsule Take 1 capsule (100 mg total) by mouth 3 (three) times daily as needed for cough. 02/17/18   Copland,  Gay Filler, MD  budesonide-formoterol (SYMBICORT) 160-4.5 MCG/ACT inhaler Inhale 2 puffs into the lungs 2 (two) times daily. 02/15/18   Reyne Dumas, MD  diltiazem (TIAZAC) 120 MG 24 hr capsule TAKE 1 CAPSULE BY MOUTH DAILY 05/06/18   Lelon Perla, MD  ELIQUIS 5 MG TABS tablet TAKE 1 TABLET(5 MG) BY MOUTH TWICE DAILY 02/28/18   Lelon Perla, MD  fluticasone (FLONASE) 50 MCG/ACT nasal spray Place 1 spray into both nostrils daily as needed for allergies. 02/15/18   Reyne Dumas, MD  furosemide (LASIX) 20 MG tablet Take 1 tablet (20 mg total) by mouth daily. Use as needed for swelling Patient taking differently: Take 20 mg by mouth daily as needed for fluid. Use as needed for swelling 08/16/17   Copland, Gay Filler, MD  guaiFENesin (MUCINEX) 600 MG 12 hr tablet Take 1 tablet (600 mg total) by mouth 2 (two) times daily as needed for cough. 02/15/18   Reyne Dumas, MD  ipratropium (ATROVENT) 0.03 % nasal spray Place 2 sprays into the nose 4 (four) times daily. Use as needed for post nasal drip 04/28/18   Copland, Gay Filler, MD  mirabegron ER (MYRBETRIQ) 25 MG TB24 tablet Take 1 tablet (25 mg total) by mouth daily. Use for bladder symptoms 07/07/17   Copland, Gay Filler, MD  mometasone-formoterol (DULERA) 200-5 MCG/ACT AERO Inhale 2 puffs into the lungs 2 (two) times daily. 02/23/18   Parrett, Fonnie Mu, NP  montelukast (SINGULAIR) 10 MG tablet TAKE 1 TABLET BY MOUTH DAILY 04/04/18   Copland, Gay Filler, MD  Multiple Vitamins-Minerals (MULTIVITAMIN WITH MINERALS) tablet Take 1 tablet by mouth daily.    [provider]  pantoprazole (PROTONIX) 40 MG tablet Take 1 tablet (40 mg total) by mouth daily before breakfast. 09/18/16   Copland, Gay Filler, MD  tiZANidine (ZANAFLEX) 2 MG tablet Take 1 tablet (2 mg total) by mouth every 8 (eight) hours as needed for muscle spasms. 07/01/18   Wendling, Crosby Oyster, DO  traMADol (ULTRAM) 50 MG tablet 1 tab every 12 hrs as needed for pain. 07/01/18   Shelda Pal, DO    Family History Family History  Problem Relation Age of Onset  . Heart failure Mother   . Diabetes Father   . Heart disease Other        No family history    Social History Social History   Tobacco Use  . Smoking status: Former Smoker    Packs/day: 1.00    Years: 20.00    Pack years: 20.00    Types: Cigarettes    Quit date: 04/13/1981    Years since quitting: 37.2  . Smokeless tobacco: Never Used  Substance Use Topics  . Alcohol use: Yes    Comment: Occasional  . Drug use: No     Allergies   Patient has no known allergies.   Review of Systems Review of Systems  All systems reviewed and negative, other than as noted in HPI.  Physical Exam Updated Vital Signs BP 138/63   Pulse (!) 104   Temp 98.1 F (36.7 C) (Oral)   Resp 20   Ht 5' 3.5" (1.613 m)   Wt 71.7 kg   SpO2 100%   BMI 27.55 kg/m   Physical Exam Vitals signs and nursing note reviewed.  Constitutional:      General: She is not in acute distress.    Appearance: She is well-developed.  HENT:     Head: Normocephalic and atraumatic.  Eyes:     General:        Right eye: No discharge.        Left eye: No discharge.     Conjunctiva/sclera: Conjunctivae normal.  Neck:     Musculoskeletal: Neck supple.  Cardiovascular:     Rate and Rhythm: Normal rate. Rhythm irregular.     Heart sounds: Normal heart sounds. No murmur. No friction rub. No gallop.   Pulmonary:     Effort: Pulmonary effort is normal. No respiratory distress.     Breath sounds: Normal breath sounds.  Abdominal:     General: There is no distension.     Palpations: Abdomen is soft.     Tenderness: There is no abdominal tenderness.  Musculoskeletal:        General: No tenderness.  Skin:    General: Skin is warm and dry.  Neurological:     Mental Status: She is alert.  Psychiatric:        Behavior: Behavior normal.        Thought Content: Thought content normal.      ED Treatments / Results  Labs (all labs  ordered are listed, but only abnormal results are displayed) Labs Reviewed  CBC WITH DIFFERENTIAL/PLATELET - Abnormal; Notable for the following components:      Result Value   Hemoglobin 11.4 (*)    MCH 25.4 (*)    RDW 20.6 (*)    All other components within normal limits  BASIC METABOLIC PANEL - Abnormal; Notable for the following components:   Glucose, Bld 204 (*)    BUN 26 (*)    Creatinine, Ser 1.17 (*)    GFR calc non Af Amer 41 (*)    GFR calc Af Amer 47 (*)    All other components within normal limits  MAGNESIUM     EKG:  Rhythm: atrial fibrillation PVCs Rate: 92 QRS; 80 ms QTc: 436 ms ST segments: normal     Radiology No results found.  Procedures .Sedation  Date/Time: 07/08/2018 3:19 PM Performed by: Virgel Manifold, MD Authorized by: Virgel Manifold, MD   Consent:    Consent obtained:  Verbal and written   Consent given by:  Patient   Risks discussed:  Allergic reaction, dysrhythmia, inadequate sedation, nausea, prolonged hypoxia resulting in organ damage, prolonged sedation necessitating reversal, respiratory compromise necessitating ventilatory assistance and intubation and vomiting   Alternatives discussed:  Analgesia without sedation, anxiolysis and regional anesthesia Universal protocol:    Procedure explained and questions answered to patient or proxy's satisfaction: yes     Relevant documents present and verified: yes     Test results available and properly labeled: yes     Imaging studies available: yes     Required blood products, implants, devices, and special equipment available: yes     Site/side marked: yes     Immediately prior to procedure a time out was called: yes     Patient identity confirmation method:  Verbally with patient  Indications:    Procedure necessitating sedation performed by:  Physician performing sedation Pre-sedation assessment:    Time since last food or drink:  .   ASA classification: class 3 - patient with severe  systemic disease     Neck mobility: normal     Mouth opening:  2 finger widths   Thyromental distance:  3 finger widths   Mallampati score:  II - soft palate, uvula, fauces visible   Pre-sedation assessments completed and reviewed: airway patency, cardiovascular function, hydration status, mental status, nausea/vomiting, pain level, respiratory function and temperature   Immediate pre-procedure details:    Reassessment: Patient reassessed immediately prior to procedure     Reviewed: vital signs, relevant labs/tests and NPO status     Verified: bag valve mask available, emergency equipment available, intubation equipment available, IV patency confirmed, oxygen available and suction available     Verified comment:  Dentures removed Procedure details (see MAR for exact dosages):    Preoxygenation:  Nasal cannula   Sedation:  Etomidate   Intra-procedure monitoring:  Blood pressure monitoring, cardiac monitor, continuous pulse oximetry, frequent LOC assessments, frequent vital sign checks and continuous capnometry   Intra-procedure events: none     Total Provider sedation time (minutes):  30 Post-procedure details:    Attendance: Constant attendance by certified staff until patient recovered     Recovery: Patient returned to pre-procedure baseline     Post-sedation assessments completed and reviewed: airway patency, cardiovascular function, hydration status, mental status, nausea/vomiting, pain level, respiratory function and temperature     Patient is stable for discharge or admission: yes     Patient tolerance:  Tolerated well, no immediate complications .Cardioversion  Date/Time: 07/08/2018 3:20 PM Performed by: Virgel Manifold, MD Authorized by: Virgel Manifold, MD   Consent:    Consent obtained:  Written   Consent given by:  Patient   Risks discussed:  Induced arrhythmia, pain and cutaneous burn   Alternatives discussed:  Alternative treatment, referral and delayed treatment  Pre-procedure details:    Cardioversion basis:  Elective   Rhythm:  Atrial fibrillation Patient sedated: Yes. Refer to sedation procedure documentation for details of sedation.  Attempt one:    Cardioversion mode:  Synchronous   Waveform:  Biphasic   Shock (Joules):  120   Shock outcome:  Conversion to normal sinus rhythm Post-procedure details:    Patient status:  Awake   Patient tolerance of procedure:  Tolerated well, no immediate complications   CRITICAL CARE Performed by: Virgel Manifold Total critical care time: 35 minutes Critical care time was exclusive of separately billable procedures and treating other patients. Critical care was necessary to treat or prevent imminent or life-threatening deterioration. Critical care was time spent personally by me on the following activities: development of treatment plan with patient and/or surrogate as well as nursing, discussions with consultants, evaluation of patient's response to treatment, examination of patient, obtaining history from patient or surrogate, ordering and performing treatments and interventions, ordering and review of laboratory studies, ordering and review of radiographic studies, pulse oximetry and re-evaluation of patient's condition.   (including critical care time)    Medications Ordered in ED Medications  etomidate (AMIDATE) injection 12 mg (has no administration in time range)  sodium chloride flush (NS) 0.9 % injection 3 mL (has no administration in time range)  sodium chloride flush (NS) 0.9 % injection 3 mL (has no administration in time range)  0.9 %  sodium chloride infusion (has no administration in time range)  Initial Impression / Assessment and Plan / ED Course  I have reviewed the triage vital signs and the nursing notes.  Pertinent labs & imaging results that were available during my care of the patient were reviewed by me and considered in my medical decision making (see chart for details).    85 year old female with  sensation that she is just not feeling quite right.  Noted to be in atrial fibrillation.  History of the same.  I suspect her symptoms may be from this.  She is rate controlled.  Basic labs pretty unremarkable.  She is anticoagulated and has not missed any recent doses.  Previous cardioversion and she was agreeable to attempt this again today.  She was cardioverted back to a sinus rhythm.  Subsequent care signed out to Dr. Billy Fischer for continued observation post sedation.  She will need to follow back up with cardiology on outpatient basis.  Final Clinical Impressions(s) / ED Diagnoses   Final diagnoses:  Atrial fibrillation, unspecified type [facility])    ED Discharge Orders    None       Virgel Manifold, MD 07/08/18 1523

## 2018-07-26 DIAGNOSIS — C679 Malignant neoplasm of bladder, unspecified: Secondary | ICD-10-CM | POA: Insufficient documentation

## 2018-07-26 NOTE — Progress Notes (Signed)
[place] at [facility] [address] [place], [place] [address] [phone]  Date:  07/27/2018   Name:  Crystal Kerr   DOB:  [birth year]   MRN:  [id number]  PCP:  [name], MD    Chief Complaint: No chief complaint on file.   History of Present Illness:  Crystal Kerr is a 85 y.o. very pleasant female patient who presents with the following:  Very nice elderly lady who is here today for a follow-up visit Virtual visit today due to pandemic Patient location is home, provider location is office Patient identity is confirmed with 2 factors, she gives permission for virtual visit today History of COPD/interstitial lung disease, CHF, A. fib on Eliquis, chronic renal insufficiency, bladder cancer She lives with her daughter [name] who is instrumental in her care and wellbeing  She has had my partner Dr. Nani Ravens about 1 month ago with left hip pain-she was given Zanaflex and tramadol, they did not do any x-rays at that time.  Today she reports that her hip is basically back to normal, no longer an issue  She was also in the ER at the end of June, was cardioverted Her cardiologist is [name] She is currently taking Eliquis to prevent clot She uses Lasix as needed for edema- she did use this for a few days before she ended up being cardioverted.  She is no longer using Lasix   her urologist is [name] at [facility].  They recently tried intrathecal BCG treatments, but she had a bad reaction and this had to be stopped Reviewed most recent urology note from June 4.  She has cystoscopy, for now they plan to observe and recheck in 3 months  Labs drawn in the ER at the end of June at time of cardioversion Can offer DEXA scan  Her main issue today is possible restless leg syndrome She notes that she is taking zanaflex nightly still- this helps her to sleep.  She feels an urge to move her legs, like she has  restless legs.  She has really just noticed this for the last 3 weeks or so She will almost be asleep and then she will have an urge to move her legs, this prevents her from sleeping She feels like the zanaflex does not leave her hungover in the am -she is tolerating it well, but would be willing to try Requip which I think may be safer  Her breathing is currently fine Swelling is resolved She did lose "a lot of weight" when she had her cardioversion  Her weight seems back to baseline  Patient Active Problem List   Diagnosis Date Noted  . COPD exacerbation ([place]) 02/14/2018  . Acute respiratory failure with hypoxia ([place]) 02/13/2018  . Renal insufficiency 02/10/2018  . Acute bronchitis 01/18/2018  . Pre-operative clearance 12/22/2017  . Chronic diastolic heart failure ([place]) 11/23/2017  . ILD (interstitial lung disease) ([place]) 11/23/2017  . CAP (community acquired pneumonia) 03/04/2017  . Current use of long term anticoagulation 07/31/2016  . Atypical pneumonia 06/02/2016  . History of atrial fibrillation 06/02/2016  . Lower extremity edema 12/19/2013  . 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 Benign paroxysmal positional vertigo 09/13/2013  . Upper airway cough syndrome 07/10/2013  . Allergic rhinitis 07/05/2013  . Persistent atrial fibrillation 03/30/2013  . Shortness of breath 02/06/2013  . Acute on chronic  diastolic congestive heart failure ([place]) 02/04/2013  . Arthritis of spine 07/01/2012  . Intrinsic asthma 07/01/2012  . Dyspnea 04/12/2012  . PVC's (premature ventricular contractions) 04/24/2011  . Murmur, cardiac 04/24/2011    Past Medical History:  Diagnosis Date  . Arthritis   . Asthma   . Atrial fibrillation ([place])    a. s/p DCCV in 2015, on Amiodarone and Eliquis  . CHF (congestive heart failure) ([place])   . COPD (chronic obstructive pulmonary disease) ([place])   . Dysrhythmia    afib -hx   . Neuromuscular  disorder ([place])    peripheral neuropathy   . Pneumonia    hx of x 2   . PONV (postoperative nausea and vomiting)   . PVC's (premature ventricular contractions)     Past Surgical History:  Procedure Laterality Date  . APPENDECTOMY    . CARDIOVERSION N/A 03/08/2013   Procedure: CARDIOVERSION;  Surgeon: Larey Dresser, MD;  Location: [facility];  Service: Cardiovascular;  Laterality: N/A;  . CHOLECYSTECTOMY    . oophrectomy    . TEE WITHOUT CARDIOVERSION N/A 03/08/2013   Procedure: TRANSESOPHAGEAL ECHOCARDIOGRAM (TEE);  Surgeon: Larey Dresser, MD;  Location: [facility];  Service: Cardiovascular;  Laterality: N/A;  . TONSILLECTOMY    . TRANSURETHRAL RESECTION OF BLADDER TUMOR WITH GYRUS (TURBT-GYRUS)  05/13/2016   urology wiht [facility]  . TRANSURETHRAL RESECTION OF BLADDER TUMOR WITH MITOMYCIN-C N/A 12/24/2017   Procedure: TRANSURETHRAL RESECTION OF BLADDER TUMOR;  Surgeon: Lucas Mallow, MD;  Location: [facility];  Service: Urology;  Laterality: N/A;    Social History   Tobacco Use  . Smoking status: Former Smoker    Packs/day: 1.00    Years: 20.00    Pack years: 20.00    Types: Cigarettes    Quit date: 04/13/1981    Years since quitting: 37.3  . Smokeless tobacco: Never Used  Substance Use Topics  . Alcohol use: Yes    Comment: Occasional  . Drug use: No    Family History  Problem Relation Age of Onset  . Heart failure Mother   . Diabetes Father   . Heart disease Other        No family history    No Known Allergies  Medication list has been reviewed and updated.  Current Outpatient Medications on File Prior to Visit  Medication Sig Dispense Refill  . acetaminophen (TYLENOL) 325 MG tablet Take 2 tablets (650 mg total) by mouth every 6 (six) hours as needed for mild pain or headache. 30 tablet 0  . albuterol (PROAIR HFA) 108 (90 Base) MCG/ACT inhaler Inhale 2 puffs into the lungs every 4 (four) hours as needed for wheezing or shortness of breath. 18 g 11  .  benzonatate (TESSALON) 100 MG capsule Take 1 capsule (100 mg total) by mouth 3 (three) times daily as needed for cough. 40 capsule 0  . budesonide-formoterol (SYMBICORT) 160-4.5 MCG/ACT inhaler Inhale 2 puffs into the lungs 2 (two) times daily. 10.2 Inhaler 4  . diltiazem (TIAZAC) 120 MG 24 hr capsule TAKE 1 CAPSULE BY MOUTH DAILY 90 capsule 3  . ELIQUIS 5 MG TABS tablet TAKE 1 TABLET(5 MG) BY MOUTH TWICE DAILY 180 tablet 1  . fluticasone (FLONASE) 50 MCG/ACT nasal spray Place 1 spray into both nostrils daily as needed for allergies. 16 g 0  . furosemide (LASIX) 20 MG tablet Take 1 tablet (20 mg total) by mouth daily. Use as needed for swelling (Patient taking differently: Take 20 mg by  mouth daily as needed for fluid. Use as needed for swelling) 90 tablet 3  . guaiFENesin (MUCINEX) 600 MG 12 hr tablet Take 1 tablet (600 mg total) by mouth 2 (two) times daily as needed for cough. 20 tablet 0  . ipratropium (ATROVENT) 0.03 % nasal spray Place 2 sprays into the nose 4 (four) times daily. Use as needed for post nasal drip 30 mL 6  . mirabegron ER (MYRBETRIQ) 25 MG TB24 tablet Take 1 tablet (25 mg total) by mouth daily. Use for bladder symptoms 30 tablet 6  . mometasone-formoterol (DULERA) 200-5 MCG/ACT AERO Inhale 2 puffs into the lungs 2 (two) times daily. 1 Inhaler 5  . montelukast (SINGULAIR) 10 MG tablet TAKE 1 TABLET BY MOUTH DAILY 90 tablet 1  . Multiple Vitamins-Minerals (MULTIVITAMIN WITH MINERALS) tablet Take 1 tablet by mouth daily.    . pantoprazole (PROTONIX) 40 MG tablet Take 1 tablet (40 mg total) by mouth daily before breakfast. 90 tablet 3  . tiZANidine (ZANAFLEX) 2 MG tablet Take 1 tablet (2 mg total) by mouth every 8 (eight) hours as needed for muscle spasms. 21 tablet 0  . traMADol (ULTRAM) 50 MG tablet 1 tab every 12 hrs as needed for pain. 60 tablet 0   No current facility-administered medications on file prior to visit.     Review of Systems:  As per HPI- otherwise  negative.   Physical Examination: There were no vitals filed for this visit. There were no vitals filed for this visit. There is no height or weight on file to calculate BMI. Ideal Body Weight:    Patient observed for video today.  She looks well, her normal self No cough, wheezing, distress is noted Her daughter [name] is also present on the call, she contributes to the history today  They are not checking vital signs at home  Assessment and Plan:   ICD-10-CM   1. Restless legs  G25.81 rOPINIRole (REQUIP) 0.5 MG tablet  2. Malignant neoplasm of urinary bladder, unspecified site [facility])  C67.9    Virtual visit today for concern of restless leg syndrome Consider electrolyte issue.  However, she states she has not used Lasix since she was in the emergency room, and her labs were okay at that time.  She does not wish to come in for a lab visit right now She is currently using Zanaflex at bedtime.  I do think Requip would probably be safer than using a muscle relaxer chronically and is 85 year old Prescribed Requip 0.25, can increase to 0.5 mg after a few days if needed  Asked her let me know this works for Continue to follow with urology as planned  Follow-up: No follow-ups on file.  Meds ordered this encounter  Medications  . rOPINIRole (REQUIP) 0.5 MG tablet    Sig: Start with 1/2 tablet at bedtime.  May increase to a whole tablet after 3 days if needed for restless legs    Dispense:  30 tablet    Refill:  6   No orders of the defined types were placed in this encounter.     Signed Lamar Blinks, MD

## 2018-07-27 ENCOUNTER — Encounter: Payer: Self-pay | Admitting: Family Medicine

## 2018-07-27 ENCOUNTER — Ambulatory Visit (INDEPENDENT_AMBULATORY_CARE_PROVIDER_SITE_OTHER): Payer: Medicare Other | Admitting: Family Medicine

## 2018-07-27 ENCOUNTER — Other Ambulatory Visit: Payer: Self-pay

## 2018-07-27 DIAGNOSIS — G2581 Restless legs syndrome: Secondary | ICD-10-CM | POA: Diagnosis not present

## 2018-07-27 DIAGNOSIS — C679 Malignant neoplasm of bladder, unspecified: Secondary | ICD-10-CM | POA: Diagnosis not present

## 2018-07-27 MED ORDER — ROPINIROLE HCL 0.5 MG PO TABS: ORAL_TABLET | ORAL | 6 refills | Status: DC

## 2018-08-11 ENCOUNTER — Telehealth: Payer: Self-pay | Admitting: *Deleted

## 2018-08-11 NOTE — Telephone Encounter (Signed)
Left message for pt daughter to call, august is the month the patient needs a repeat high resolution CT scan to follow up on lungs, calling to get scheduled.

## 2018-09-02 NOTE — Telephone Encounter (Signed)
Spoke with pt dtr, the patient is not wanting to have that done at this time because of the virus. They will let us know when and if they want to schedule.

## 2018-09-08 ENCOUNTER — Other Ambulatory Visit: Payer: Self-pay | Admitting: Cardiology

## 2018-09-08 NOTE — Telephone Encounter (Signed)
31f 73kg Scr 1.17 07/08/18 Lovw/c[facility]shaw 06/29/18

## 2018-09-13 ENCOUNTER — Encounter: Payer: Self-pay | Admitting: Family Medicine

## 2018-09-13 DIAGNOSIS — H9190 Unspecified hearing loss, unspecified ear: Secondary | ICD-10-CM

## 2018-09-16 DIAGNOSIS — C678 Malignant neoplasm of overlapping sites of bladder: Secondary | ICD-10-CM | POA: Diagnosis not present

## 2018-09-20 ENCOUNTER — Other Ambulatory Visit: Payer: Self-pay | Admitting: Adult Health

## 2018-09-20 DIAGNOSIS — H906 Mixed conductive and sensorineural hearing loss, bilateral: Secondary | ICD-10-CM | POA: Diagnosis not present

## 2018-09-20 DIAGNOSIS — H9313 Tinnitus, bilateral: Secondary | ICD-10-CM | POA: Diagnosis not present

## 2018-09-20 DIAGNOSIS — Z822 Family history of deafness and hearing loss: Secondary | ICD-10-CM | POA: Diagnosis not present

## 2018-09-30 ENCOUNTER — Other Ambulatory Visit: Payer: Self-pay

## 2018-09-30 ENCOUNTER — Emergency Department (HOSPITAL_BASED_OUTPATIENT_CLINIC_OR_DEPARTMENT_OTHER): Payer: Medicare Other

## 2018-09-30 ENCOUNTER — Emergency Department (HOSPITAL_BASED_OUTPATIENT_CLINIC_OR_DEPARTMENT_OTHER)
Admission: EM | Admit: 2018-09-30 | Discharge: 2018-09-30 | Disposition: A | Discharge status: Home / Self Care | Payer: Medicare Other | Attending: Emergency Medicine | Admitting: Emergency Medicine

## 2018-09-30 ENCOUNTER — Encounter (HOSPITAL_BASED_OUTPATIENT_CLINIC_OR_DEPARTMENT_OTHER): Payer: Self-pay | Admitting: Emergency Medicine

## 2018-09-30 DIAGNOSIS — Z0189 Encounter for other specified special examinations: Secondary | ICD-10-CM

## 2018-09-30 DIAGNOSIS — R079 Chest pain, unspecified: Secondary | ICD-10-CM | POA: Diagnosis not present

## 2018-09-30 DIAGNOSIS — L03032 Cellulitis of left toe: Secondary | ICD-10-CM | POA: Diagnosis not present

## 2018-09-30 DIAGNOSIS — I4891 Unspecified atrial fibrillation: Secondary | ICD-10-CM | POA: Insufficient documentation

## 2018-09-30 DIAGNOSIS — I5032 Chronic diastolic (congestive) heart failure: Secondary | ICD-10-CM | POA: Insufficient documentation

## 2018-09-30 DIAGNOSIS — J449 Chronic obstructive pulmonary disease, unspecified: Secondary | ICD-10-CM | POA: Diagnosis not present

## 2018-09-30 DIAGNOSIS — I110 Hypertensive heart disease with heart failure: Secondary | ICD-10-CM | POA: Insufficient documentation

## 2018-09-30 DIAGNOSIS — R002 Palpitations: Secondary | ICD-10-CM | POA: Diagnosis present

## 2018-09-30 DIAGNOSIS — Z4502 Encounter for adjustment and management of automatic implantable cardiac defibrillator: Secondary | ICD-10-CM | POA: Diagnosis not present

## 2018-09-30 DIAGNOSIS — Z87891 Personal history of nicotine dependence: Secondary | ICD-10-CM | POA: Insufficient documentation

## 2018-09-30 DIAGNOSIS — R0602 Shortness of breath: Secondary | ICD-10-CM | POA: Diagnosis not present

## 2018-09-30 DIAGNOSIS — Z79899 Other long term (current) drug therapy: Secondary | ICD-10-CM | POA: Diagnosis not present

## 2018-09-30 LAB — CBC
HCT: 42.5 % (ref 36.0–46.0)
Hemoglobin: 13.4 g/dL (ref 12.0–15.0)
MCH: 27.2 pg (ref 26.0–34.0)
MCHC: 31.5 g/dL (ref 30.0–36.0)
MCV: 86.4 fL (ref 80.0–100.0)
Platelets: 263 10*3/uL (ref 150–400)
RBC: 4.92 MIL/uL (ref 3.87–5.11)
RDW: 15.8 % — ABNORMAL HIGH (ref 11.5–15.5)
WBC: 10.5 10*3/uL (ref 4.0–10.5)
nRBC: 0 % (ref 0.0–0.2)

## 2018-09-30 LAB — MAGNESIUM: Magnesium: 2 mg/dL (ref 1.7–2.4)

## 2018-09-30 LAB — BASIC METABOLIC PANEL
Anion gap: 10 (ref 5–15)
BUN: 26 mg/dL — ABNORMAL HIGH (ref 8–23)
CO2: 24 mmol/L (ref 22–32)
Calcium: 9.4 mg/dL (ref 8.9–10.3)
Chloride: 103 mmol/L (ref 98–111)
Creatinine, Ser: 0.83 mg/dL (ref 0.44–1.00)
GFR calc Af Amer: >60 mL/min (ref >60)
GFR calc non Af Amer: >60 mL/min (ref >60)
Glucose, Bld: 104 mg/dL — ABNORMAL HIGH (ref 70–99)
Potassium: 4.2 mmol/L (ref 3.5–5.1)
Sodium: 137 mmol/L (ref 135–145)

## 2018-09-30 LAB — TSH: TSH: 1.388 u[IU]/mL (ref 0.350–4.500)

## 2018-09-30 MED ORDER — ONDANSETRON HCL 4 MG/2ML IJ SOLN
4.0000 mg | Freq: Once | INTRAMUSCULAR | Status: AC
  Administered 2018-09-30: 4 mg via INTRAVENOUS
  Filled 2018-09-30: qty 2

## 2018-09-30 MED ORDER — CEPHALEXIN 250 MG PO CAPS
500.0000 mg | ORAL_CAPSULE | Freq: Once | ORAL | Status: AC
  Administered 2018-09-30: 12:00:00 500 mg via ORAL
  Filled 2018-09-30: qty 2

## 2018-09-30 MED ORDER — ETOMIDATE 2 MG/ML IV SOLN
0.3000 mg/kg | Freq: Once | INTRAVENOUS | Status: AC
  Administered 2018-09-30: 12 mg via INTRAVENOUS
  Filled 2018-09-30: qty 20

## 2018-09-30 MED ORDER — CEPHALEXIN 500 MG PO CAPS: 500.0000 mg | ORAL_CAPSULE | Freq: Four times a day (QID) | ORAL | 0 refills | Status: DC

## 2018-09-30 NOTE — ED Triage Notes (Signed)
Palpitations since arising this morning.  Denies CP.  SOB with exertion.

## 2018-09-30 NOTE — ED Provider Notes (Signed)
[facility] EMERGENCY DEPARTMENT Provider Note   CSN: [id number] Arrival date & time: 09/30/18  [id number]     History   Chief Complaint Chief Complaint  Patient presents with  . Palpitations    HPI Crystal Kerr is a 85 y.o. female.     Pt presents to the ED today with palpitations since arising this morning.  She denies cp.  She does feel sob.  Pt has a hx of paroxysmal afib and feels like she's gone into it today.  She has been compliant with her Eliquis.  She was last cardioverted in June.  Pt has also had some redness to her left big toe.  Her daughter filed down her toenail and nicked her skin.    CHA2DS2/VAS Stroke Risk Points  Current as of 10 days ago     5 >= 2 Points: High Risk  1 - 1.99 Points: Medium Risk  0 Points: Low Risk    This is the only CHA2DS2/VAS Stroke Risk Points available for the past  year.: Last Change: N/A     Details    This score determines the patient's risk of having a stroke if the  patient has atrial fibrillation.       Points Metrics  1 Has Congestive Heart Failure:  Yes    Current as of 10 days ago  0 Has Vascular Disease:  No    Current as of 10 days ago  1 Has Hypertension:  Yes    Current as of 10 days ago  2 Age:  15    Current as of 10 days ago  0 Has Diabetes:  No    Current as of 10 days ago  0 Had Stroke:  No  Had TIA:  No  Had thromboembolism:  No    Current as of 10 days ago  1 Female:  Yes    Current as of 10 days ago              Past Medical History:  Diagnosis Date  . Arthritis   . Asthma   . Atrial fibrillation ([place])    a. s/p DCCV in 2015, on Amiodarone and Eliquis  . CHF (congestive heart failure) ([place])   . COPD (chronic obstructive pulmonary disease) ([place])   . Dysrhythmia    afib -hx   . Neuromuscular disorder ([place])    peripheral neuropathy   . Pneumonia    hx of x 2   . PONV (postoperative nausea and vomiting)   . PVC's (premature ventricular contractions)     Patient Active  Problem List   Diagnosis Date Noted  . Bladder cancer ([place]) 07/26/2018  . Renal insufficiency 02/10/2018  . Chronic diastolic heart failure ([place]) 11/23/2017  . ILD (interstitial lung disease) ([place]) 11/23/2017  . Current use of long term anticoagulation 07/31/2016  . Atypical pneumonia 06/02/2016  . History of atrial fibrillation 06/02/2016  . Lower extremity edema 12/19/2013  . 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 Benign paroxysmal positional vertigo 09/13/2013  . Upper airway cough syndrome 07/10/2013  . Allergic rhinitis 07/05/2013  . Persistent atrial fibrillation 03/30/2013  . Shortness of breath 02/06/2013  . Acute on chronic diastolic congestive heart failure ([place]) 02/04/2013  . Arthritis of spine 07/01/2012  . Intrinsic asthma 07/01/2012  . Dyspnea 04/12/2012  . PVC's (premature ventricular contractions) 04/24/2011  . Murmur, cardiac 04/24/2011    Past Surgical History:  Procedure  Laterality Date  . APPENDECTOMY    . CARDIOVERSION N/A 03/08/2013   Procedure: CARDIOVERSION;  Surgeon: Larey Dresser, MD;  Location: [facility];  Service: Cardiovascular;  Laterality: N/A;  . CHOLECYSTECTOMY    . oophrectomy    . TEE WITHOUT CARDIOVERSION N/A 03/08/2013   Procedure: TRANSESOPHAGEAL ECHOCARDIOGRAM (TEE);  Surgeon: Larey Dresser, MD;  Location: [facility];  Service: Cardiovascular;  Laterality: N/A;  . TONSILLECTOMY    . TRANSURETHRAL RESECTION OF BLADDER TUMOR WITH GYRUS (TURBT-GYRUS)  05/13/2016   urology wiht [facility]  . TRANSURETHRAL RESECTION OF BLADDER TUMOR WITH MITOMYCIN-C N/A 12/24/2017   Procedure: TRANSURETHRAL RESECTION OF BLADDER TUMOR;  Surgeon: Lucas Mallow, MD;  Location: [facility];  Service: Urology;  Laterality: N/A;     OB History   No obstetric history on file.      Home Medications    Prior to Admission medications   Medication Sig Start Date End Date Taking? Authorizing Provider   acetaminophen (TYLENOL) 325 MG tablet Take 2 tablets (650 mg total) by mouth every 6 (six) hours as needed for mild pain or headache. 06/05/16   Raiford Noble Latif, DO  albuterol [facility] HFA) 108 (90 Base) MCG/ACT inhaler Inhale 2 puffs into the lungs every 4 (four) hours as needed for wheezing or shortness of breath. 02/15/18   Reyne Dumas, MD  benzonatate (TESSALON) 100 MG capsule Take 1 capsule (100 mg total) by mouth 3 (three) times daily as needed for cough. 02/17/18   Copland, Gay Filler, MD  budesonide-formoterol (SYMBICORT) 160-4.5 MCG/ACT inhaler Inhale 2 puffs into the lungs 2 (two) times daily. 02/15/18   Reyne Dumas, MD  cephALEXin (KEFLEX) 500 MG capsule Take 1 capsule (500 mg total) by mouth 4 (four) times daily. 09/30/18   Isla Pence, MD  diltiazem (TIAZAC) 120 MG 24 hr capsule TAKE 1 CAPSULE BY MOUTH DAILY 05/06/18   Lelon Perla, MD  DULERA 200-5 MCG/ACT AERO INHALE 2 PUFFS INTO THE LUNGS TWICE DAILY 09/20/18   Parrett, Fonnie Mu, NP  ELIQUIS 5 MG TABS tablet TAKE 1 TABLET(5 MG) BY MOUTH TWICE DAILY 09/08/18   Lelon Perla, MD  fluticasone (FLONASE) 50 MCG/ACT nasal spray Place 1 spray into both nostrils daily as needed for allergies. 02/15/18   Reyne Dumas, MD  furosemide (LASIX) 20 MG tablet Take 1 tablet (20 mg total) by mouth daily. Use as needed for swelling Patient taking differently: Take 20 mg by mouth daily as needed for fluid. Use as needed for swelling 08/16/17   Copland, Gay Filler, MD  guaiFENesin (MUCINEX) 600 MG 12 hr tablet Take 1 tablet (600 mg total) by mouth 2 (two) times daily as needed for cough. 02/15/18   Reyne Dumas, MD  ipratropium (ATROVENT) 0.03 % nasal spray Place 2 sprays into the nose 4 (four) times daily. Use as needed for post nasal drip 04/28/18   Copland, Gay Filler, MD  mirabegron ER (MYRBETRIQ) 25 MG TB24 tablet Take 1 tablet (25 mg total) by mouth daily. Use for bladder symptoms 07/07/17   Copland, Gay Filler, MD  montelukast (SINGULAIR) 10 MG tablet  TAKE 1 TABLET BY MOUTH DAILY 04/04/18   Copland, Gay Filler, MD  Multiple Vitamins-Minerals (MULTIVITAMIN WITH MINERALS) tablet Take 1 tablet by mouth daily.    [provider]  pantoprazole (PROTONIX) 40 MG tablet Take 1 tablet (40 mg total) by mouth daily before breakfast. 09/18/16   Copland, Gay Filler, MD  rOPINIRole (REQUIP) 0.5 MG tablet Start with 1/2  tablet at bedtime.  May increase to a whole tablet after 3 days if needed for restless legs 07/27/18   Copland, Gay Filler, MD  tiZANidine (ZANAFLEX) 2 MG tablet Take 1 tablet (2 mg total) by mouth every 8 (eight) hours as needed for muscle spasms. 07/01/18   Wendling, Crosby Oyster, DO  traMADol (ULTRAM) 50 MG tablet 1 tab every 12 hrs as needed for pain. 07/01/18   Shelda Pal, DO    Family History Family History  Problem Relation Age of Onset  . Heart failure Mother   . Diabetes Father   . Heart disease Other        No family history    Social History Social History   Tobacco Use  . Smoking status: Former Smoker    Packs/day: 1.00    Years: 20.00    Pack years: 20.00    Types: Cigarettes    Quit date: 04/13/1981    Years since quitting: 37.4  . Smokeless tobacco: Never Used  Substance Use Topics  . Alcohol use: Yes    Comment: Occasional  . Drug use: No     Allergies   Patient has no known allergies.   Review of Systems Review of Systems  Respiratory: Positive for shortness of breath.   Cardiovascular: Positive for palpitations.  Musculoskeletal:       Left big toe redness  All other systems reviewed and are negative.    Physical Exam Updated Vital Signs BP (!) 147/71   Pulse 70   Temp 98.1 F (36.7 C) (Oral)   Resp 19   Ht 5\' 3"  (1.6 m)   Wt 71.9 kg   SpO2 100%   BMI 28.09 kg/m   Physical Exam Vitals signs and nursing note reviewed.  Constitutional:      Appearance: Normal appearance.  HENT:     Head: Normocephalic and atraumatic.     Right Ear: External ear normal.     Left Ear:  External ear normal.     Nose: Nose normal.     Mouth/Throat:     Mouth: Mucous membranes are moist.     Pharynx: Oropharynx is clear.  Eyes:     Extraocular Movements: Extraocular movements intact.     Conjunctiva/sclera: Conjunctivae normal.     Pupils: Pupils are equal, round, and reactive to light.  Neck:     Musculoskeletal: Normal range of motion and neck supple.  Cardiovascular:     Rate and Rhythm: Tachycardia present. Rhythm irregular.     Pulses: Normal pulses.     Heart sounds: Normal heart sounds.  Pulmonary:     Effort: Pulmonary effort is normal.     Breath sounds: Normal breath sounds.  Abdominal:     General: Abdomen is flat.     Palpations: Abdomen is soft.  Musculoskeletal: Normal range of motion.  Skin:    Comments: Left large toe with wound underneath nail.  Mild redness to toe.  No paronychia.  Good pulses.  Neurological:     Mental Status: She is alert.      ED Treatments / Results  Labs (all labs ordered are listed, but only abnormal results are displayed) Labs Reviewed  BASIC METABOLIC PANEL - Abnormal; Notable for the following components:      Result Value   Glucose, Bld 104 (*)    BUN 26 (*)    All other components within normal limits  CBC - Abnormal; Notable for the following components:   RDW 15.8 (*)  All other components within normal limits  MAGNESIUM  TSH    EKG EKG Interpretation  Date/Time:  Friday September 30 2018 11:08:57 EDT Ventricular Rate:  65 PR Interval:    QRS Duration: 90 QT Interval:  391 QTC Calculation: 407 R Axis:   -7 Text Interpretation:  Sinus rhythm Atrial premature complexes Borderline prolonged PR interval after cardioversion 120J Confirmed by Isla Pence [id number]) on 09/30/2018 11:51:30 AM   Radiology Dg Chest Port 1 View  Result Date: 09/30/2018 CLINICAL DATA:  Palpitations. No chest pain. History of atrial fibrillation. EXAM: PORTABLE CHEST 1 VIEW COMPARISON:  Radiographs 02/13/2018 and  01/27/2018.  CT 02/14/2018. FINDINGS: 1011 hours. Lordotic positioning. Stable mild cardiomegaly and aortic atherosclerosis. Stable mild chronic lung disease with interstitial thickening. Increased density along the right heart border is stable, attributed to epicardial fat. There is no confluent airspace opacity, pleural effusion or pneumothorax. Advanced glenohumeral degenerative changes are present on the right. Telemetry leads overlie the chest. IMPRESSION: Stable chest without evidence of acute cardiopulmonary process. Electronically Signed   By: Richardean Sale M.D.   On: 09/30/2018 10:28    Procedures .Cardioversion  Date/Time: 09/30/2018 11:53 AM Performed by: Isla Pence, MD Authorized by: Isla Pence, MD   Consent:    Consent obtained:  Written   Consent given by:  Patient   Risks discussed:  Cutaneous burn, death, induced arrhythmia and pain   Alternatives discussed:  No treatment Pre-procedure details:    Cardioversion basis:  Emergent   Rhythm:  Atrial fibrillation   Electrode placement:  Anterior-posterior Patient sedated: Yes. Refer to sedation procedure documentation for details of sedation.  Attempt one:    Cardioversion mode:  Synchronous   Shock (Joules):  120   Shock outcome:  Conversion to normal sinus rhythm Post-procedure details:    Patient status:  Alert   Patient tolerance of procedure:  Tolerated well, no immediate complications .Sedation  Date/Time: 09/30/2018 11:54 AM Performed by: Isla Pence, MD Authorized by: Isla Pence, MD   Consent:    Consent obtained:  Written   Consent given by:  Patient Universal protocol:    Immediately prior to procedure a time out was called: yes     Patient identity confirmation method:  Verbally with patient Indications:    Procedure performed:  Cardioversion Pre-sedation assessment:    Time since last food or drink:  3   NPO status caution: urgency dictates proceeding with non-ideal NPO status      ASA classification: class 3 - patient with severe systemic disease     Mallampati score:  III - soft palate, base of uvula visible   Pre-sedation assessments completed and reviewed: airway patency, cardiovascular function, hydration status, mental status, nausea/vomiting, pain level, respiratory function and temperature   Immediate pre-procedure details:    Reassessment: Patient reassessed immediately prior to procedure     Reviewed: vital signs     Verified: bag valve mask available, emergency equipment available, intubation equipment available, IV patency confirmed, oxygen available and reversal medications available   Procedure details (see MAR for exact dosages):    Preoxygenation:  Room air   Sedation:  Etomidate   Intra-procedure monitoring:  Blood pressure monitoring, cardiac monitor, continuous capnometry, continuous pulse oximetry, frequent LOC assessments and frequent vital sign checks   Intra-procedure events: none     Total Provider sedation time (minutes):  30 Post-procedure details:    Post-sedation assessment completed:  09/30/2018 11:55 AM   Attendance: Constant attendance by certified staff until patient recovered  Recovery: Patient returned to pre-procedure baseline     Post-sedation assessments completed and reviewed: airway patency, cardiovascular function, hydration status, mental status, nausea/vomiting, pain level, respiratory function and temperature     Patient is stable for discharge or admission: yes     Patient tolerance:  Tolerated well, no immediate complications   (including critical care time)  Medications Ordered in ED Medications  cephALEXin (KEFLEX) capsule 500 mg (has no administration in time range)  etomidate (AMIDATE) injection 23.52 mg (12 mg Intravenous Given 09/30/18 1102)  ondansetron (ZOFRAN) injection 4 mg (4 mg Intravenous Given 09/30/18 1044)     Initial Impression / Assessment and Plan / ED Course  I have reviewed the triage vital signs  and the nursing notes.  Pertinent labs & imaging results that were available during my care of the patient were reviewed by me and considered in my medical decision making (see chart for details).    Pt is in NSR after cardioversion.  She is awake and alert after sedation.  She tolerated procedure well.  She is d/c home with instructions to f/u with her cardiologist, [name].  For her toe, she is given a rx for keflex.  Return if worse.  CRITICAL CARE Performed by: Isla Pence   Total critical care time: 30 minutes  Critical care time was exclusive of separately billable procedures and treating other patients.  Critical care was necessary to treat or prevent imminent or life-threatening deterioration.  Critical care was time spent personally by me on the following activities: development of treatment plan with patient and/or surrogate as well as nursing, discussions with consultants, evaluation of patient's response to treatment, examination of patient, obtaining history from patient or surrogate, ordering and performing treatments and interventions, ordering and review of laboratory studies, ordering and review of radiographic studies, pulse oximetry and re-evaluation of patient's condition. Final Clinical Impressions(s) / ED Diagnoses   Final diagnoses:  Atrial fibrillation with rapid ventricular response ([place])  Encounter for cardioversion procedure  Cellulitis of toe of left foot    ED Discharge Orders         Ordered    cephALEXin (KEFLEX) 500 MG capsule  4 times daily     09/30/18 1152           Isla Pence, MD 09/30/18 1157

## 2018-09-30 NOTE — ED Notes (Signed)
ED Provider at bedside. 

## 2018-09-30 NOTE — ED Notes (Signed)
ED Provider at bedside discussing dispo plan. °

## 2018-10-07 ENCOUNTER — Other Ambulatory Visit: Payer: Self-pay | Admitting: Family Medicine

## 2018-10-10 NOTE — Progress Notes (Signed)
HPI: FU atrial fibrillation. Patient had episode of atrial fibrillation in February 2015 in the setting of viral gastroenteritis. She had a TEE guided cardioversion. TEE February 2015showed normal LV function, lambl'sexcrescence, mild to moderate left atrial enlargement.Atpreviousoffice visit she was noted to have increasing dyspnea. There was a question of pneumonia on chest x-ray and antibiotics were added. Amiodarone discontinued as well.  Significant improvement in densities with antibiotics. High-resolution CT August 2019 showed findings most likely favoring chronic hypersensitivity pneumonitis. Follow-up recommended 12 months. Cardioversion of atrial fibrillation January 2020.  Echocardiogram repeated February 2020.  Normal LV function, mild left atrial enlargement.  Chest CT February 2020 showed no pulmonary embolus.  There was increase in size of right upper pole kidney cyst possibly complicated by interval hemorrhage.  Follow-up recommended with and without contrast or MRI.    Patient has undergone cardioversion in June and September.  Since she was last seenshe has some dyspnea on exertion but no orthopnea, PND, pedal edema, chest pain, palpitations or syncope.  When she is in atrial fibrillation she has significant fatigue and dyspnea.  Current Outpatient Medications  Medication Sig Dispense Refill  . acetaminophen (TYLENOL) 325 MG tablet Take 2 tablets (650 mg total) by mouth every 6 (six) hours as needed for mild pain or headache. 30 tablet 0  . albuterol (PROAIR HFA) 108 (90 Base) MCG/ACT inhaler Inhale 2 puffs into the lungs every 4 (four) hours as needed for wheezing or shortness of breath. 18 g 11  . diltiazem (TIAZAC) 120 MG 24 hr capsule TAKE 1 CAPSULE BY MOUTH DAILY 90 capsule 3  . DULERA 200-5 MCG/ACT AERO INHALE 2 PUFFS INTO THE LUNGS TWICE DAILY 13 g 1  . ELIQUIS 5 MG TABS tablet TAKE 1 TABLET(5 MG) BY MOUTH TWICE DAILY 180 tablet 1  . furosemide (LASIX) 20 MG  tablet Take 1 tablet (20 mg total) by mouth daily. Use as needed for swelling (Patient taking differently: Take 20 mg by mouth daily as needed for fluid. Use as needed for swelling) 90 tablet 3  . mirabegron ER (MYRBETRIQ) 25 MG TB24 tablet Take 1 tablet (25 mg total) by mouth daily. Use for bladder symptoms 30 tablet 6  . montelukast (SINGULAIR) 10 MG tablet TAKE 1 TABLET BY MOUTH DAILY 90 tablet 1  . Multiple Vitamins-Minerals (MULTIVITAMIN WITH MINERALS) tablet Take 1 tablet by mouth daily.    Marland Kitchen rOPINIRole (REQUIP) 0.5 MG tablet Start with 1/2 tablet at bedtime.  May increase to a whole tablet after 3 days if needed for restless legs 30 tablet 6  . traMADol (ULTRAM) 50 MG tablet 1 tab every 12 hrs as needed for pain. 60 tablet 0  . benzonatate (TESSALON) 100 MG capsule Take 1 capsule (100 mg total) by mouth 3 (three) times daily as needed for cough. (Patient not taking: Reported on 10/12/2018) 40 capsule 0  . budesonide-formoterol (SYMBICORT) 160-4.5 MCG/ACT inhaler Inhale 2 puffs into the lungs 2 (two) times daily. (Patient not taking: Reported on 10/12/2018) 10.2 Inhaler 4  . cephALEXin (KEFLEX) 500 MG capsule Take 1 capsule (500 mg total) by mouth 4 (four) times daily. (Patient not taking: Reported on 10/12/2018) 28 capsule 0  . fluticasone (FLONASE) 50 MCG/ACT nasal spray Place 1 spray into both nostrils daily as needed for allergies. (Patient not taking: Reported on 10/12/2018) 16 g 0  . guaiFENesin (MUCINEX) 600 MG 12 hr tablet Take 1 tablet (600 mg total) by mouth 2 (two) times daily as needed for  cough. (Patient not taking: Reported on 10/12/2018) 20 tablet 0  . ipratropium (ATROVENT) 0.03 % nasal spray Place 2 sprays into the nose 4 (four) times daily. Use as needed for post nasal drip (Patient not taking: Reported on 10/12/2018) 30 mL 6  . pantoprazole (PROTONIX) 40 MG tablet Take 1 tablet (40 mg total) by mouth daily before breakfast. (Patient not taking: Reported on 10/12/2018) 90 tablet 3  .  tiZANidine (ZANAFLEX) 2 MG tablet Take 1 tablet (2 mg total) by mouth every 8 (eight) hours as needed for muscle spasms. (Patient not taking: Reported on 10/12/2018) 21 tablet 0   No current facility-administered medications for this visit.      Past Medical History:  Diagnosis Date  . Arthritis   . Asthma   . Atrial fibrillation ([place])    a. s/p DCCV in 2015, on Amiodarone and Eliquis  . CHF (congestive heart failure) ([place])   . COPD (chronic obstructive pulmonary disease) ([place])   . Dysrhythmia    afib -hx   . Neuromuscular disorder ([place])    peripheral neuropathy   . Pneumonia    hx of x 2   . PONV (postoperative nausea and vomiting)   . PVC's (premature ventricular contractions)     Past Surgical History:  Procedure Laterality Date  . APPENDECTOMY    . CARDIOVERSION N/A 03/08/2013   Procedure: CARDIOVERSION;  Surgeon: Larey Dresser, MD;  Location: [facility];  Service: Cardiovascular;  Laterality: N/A;  . CHOLECYSTECTOMY    . oophrectomy    . TEE WITHOUT CARDIOVERSION N/A 03/08/2013   Procedure: TRANSESOPHAGEAL ECHOCARDIOGRAM (TEE);  Surgeon: Larey Dresser, MD;  Location: [facility];  Service: Cardiovascular;  Laterality: N/A;  . TONSILLECTOMY    . TRANSURETHRAL RESECTION OF BLADDER TUMOR WITH GYRUS (TURBT-GYRUS)  05/13/2016   urology wiht [facility]  . TRANSURETHRAL RESECTION OF BLADDER TUMOR WITH MITOMYCIN-C N/A 12/24/2017   Procedure: TRANSURETHRAL RESECTION OF BLADDER TUMOR;  Surgeon: Lucas Mallow, MD;  Location: [facility];  Service: Urology;  Laterality: N/A;    Social History   Socioeconomic History  . Marital status: Divorced    Spouse name: Not on file  . Number of children: Not on file  . Years of education: Not on file  . Highest education level: Not on file  Occupational History  . Occupation: Retired    Fish farm manager: RETIRED  Social Needs  . Financial resource strain: Not on file  . Food insecurity    Worry: Not on file    Inability: Not on file  .  Transportation needs    Medical: Not on file    Non-medical: Not on file  Tobacco Use  . Smoking status: Former Smoker    Packs/day: 1.00    Years: 20.00    Pack years: 20.00    Types: Cigarettes    Quit date: 04/13/1981    Years since quitting: 37.5  . Smokeless tobacco: Never Used  Substance and Sexual Activity  . Alcohol use: Yes    Comment: Occasional  . Drug use: No  . Sexual activity: Never  Lifestyle  . Physical activity    Days per week: Not on file    Minutes per session: Not on file  . Stress: Not on file  Relationships  . Social Herbalist on phone: Not on file    Gets together: Not on file    Attends religious service: Not on file    Active member of club or  organization: Not on file    Attends meetings of clubs or organizations: Not on file    Relationship status: Not on file  . Intimate partner violence    Fear of current or ex partner: Not on file    Emotionally abused: Not on file    Physically abused: Not on file    Forced sexual activity: Not on file  Other Topics Concern  . Not on file  Social History Narrative  . Not on file    Family History  Problem Relation Age of Onset  . Heart failure Mother   . Diabetes Father   . Heart disease Other        No family history    ROS: no fevers or chills, productive cough, hemoptysis, dysphasia, odynophagia, melena, hematochezia, dysuria, hematuria, rash, seizure activity, orthopnea, PND, pedal edema, claudication. Remaining systems are negative.  Physical Exam: Well-developed well-nourished in no acute distress.  Skin is warm and dry.  HEENT is normal.  Neck is supple.  Chest is clear to auscultation with normal expansion.  Cardiovascular exam is regular rate and rhythm.  Abdominal exam nontender or distended. No masses palpated. Extremities show no edema. neuro grossly intact  ECG-sinus rhythm at a rate of 68, no ST changes, low voltage.  Personally reviewed  A/P  1 paroxysmal atrial  fibrillation-patient has had 2 separate episodes of atrial fibrillation requiring cardioversion since last office visit.  Continue Cardizem and apixaban.  Long discussion today concerning options.  She does not want to go to the hospital for consideration of Tikosyn.  Amiodarone was discontinued previously and patient was treated with antibiotics in the setting of pneumonia.  I think this is more likely pneumonia instead of side effects from amiodarone.  She clearly will need an antiarrhythmic to help maintain sinus rhythm as she is very symptomatic with her atrial fibrillation.  We have agreed to try amiodarone again.  Begin 200 mg twice daily for 1 week then 200 mg daily thereafter.  She will need follow-up noncontrast chest CT for prior abnormal study.  We will see her back in 3 months and likely repeat her chest x-ray at that time.  2 hypertension-blood pressure minimally elevated; continue present medications and follow.  3 chronic diastolic congestive heart failure-patient is euvolemic today.  Continue present dose of diuretic, fluid restriction and low-sodium diet.  4 chronic interstitial lung disease-patient is followed by pulmonary.  Follow-up chest CT was recommended August 2020.  5 kidney cyst-patient states she is scheduled to follow-up with urology.  Kirk Ruths, MD

## 2018-10-12 ENCOUNTER — Other Ambulatory Visit: Payer: Self-pay

## 2018-10-12 ENCOUNTER — Ambulatory Visit (INDEPENDENT_AMBULATORY_CARE_PROVIDER_SITE_OTHER): Payer: Medicare Other | Admitting: Cardiology

## 2018-10-12 ENCOUNTER — Encounter: Payer: Self-pay | Admitting: Cardiology

## 2018-10-12 VITALS — BP 144/62 | HR 67 | Ht 63.0 in | Wt 156.1 lb

## 2018-10-12 DIAGNOSIS — I5032 Chronic diastolic (congestive) heart failure: Secondary | ICD-10-CM

## 2018-10-12 DIAGNOSIS — I10 Essential (primary) hypertension: Secondary | ICD-10-CM | POA: Diagnosis not present

## 2018-10-12 DIAGNOSIS — I480 Paroxysmal atrial fibrillation: Secondary | ICD-10-CM

## 2018-10-12 DIAGNOSIS — J849 Interstitial pulmonary disease, unspecified: Secondary | ICD-10-CM | POA: Diagnosis not present

## 2018-10-12 MED ORDER — AMIODARONE HCL 200 MG PO TABS: ORAL_TABLET | ORAL | 3 refills | Status: DC

## 2018-10-12 NOTE — Patient Instructions (Signed)
Medication Instructions:  START AMIODARONE 200 MG -TAKE 1 TABLET TWICE DAILY X 1 WEEK AND THEN DECREASE TO 1 TABLET ONCE DAILY  If you need a refill on your cardiac medications before your next appointment, please call your pharmacy.   Lab work: If you have labs (blood work) drawn today and your tests are completely normal, you will receive your results only by: Marland Kitchen MyChart Message (if you have MyChart) OR . A paper copy in the mail If you have any lab test that is abnormal or we need to change your treatment, we will call you to review the results.  Testing/Procedures: HIGH RESOLUTION CHEST CT AT THE [facility]   Follow-Up: At [facility], you and your health needs are our priority.  As part of our continuing mission to provide you with exceptional heart care, we have created designated Provider Care Teams.  These Care Teams include your primary Cardiologist (physician) and Advanced Practice Providers (APPs -  Physician Assistants and Nurse Practitioners) who all work together to provide you with the care you need, when you need it. Your physician recommends that you schedule a follow-up appointment in: [place]

## 2018-10-14 ENCOUNTER — Ambulatory Visit (INDEPENDENT_AMBULATORY_CARE_PROVIDER_SITE_OTHER): Payer: Medicare Other

## 2018-10-14 ENCOUNTER — Other Ambulatory Visit: Payer: Self-pay

## 2018-10-14 DIAGNOSIS — Z23 Encounter for immunization: Secondary | ICD-10-CM | POA: Diagnosis not present

## 2018-10-18 NOTE — Addendum Note (Signed)
Addended by: Jacqulynn Cadet on: 10/18/2018 01:24 PM   Modules accepted: Orders

## 2018-10-28 ENCOUNTER — Telehealth: Payer: Self-pay | Admitting: Cardiology

## 2018-10-28 NOTE — Telephone Encounter (Signed)
New message:     Patient daughter calling stating that they have been waiting on some one to call for CT appt. Please call daughter back.

## 2018-10-28 NOTE — Telephone Encounter (Signed)
Called and spoke to daughter- advised that the CT was ordered, and is completed by hospital, so they will call to get scheduled.  Patient daughter verbalized understanding.

## 2018-11-21 ENCOUNTER — Other Ambulatory Visit: Payer: Self-pay

## 2018-11-21 ENCOUNTER — Ambulatory Visit (HOSPITAL_BASED_OUTPATIENT_CLINIC_OR_DEPARTMENT_OTHER)
Admission: RE | Admit: 2018-11-21 | Discharge: 2018-11-21 | Disposition: A | Discharge status: Home / Self Care | Payer: Medicare Other | Source: Ambulatory Visit | Attending: Cardiology | Admitting: Cardiology

## 2018-11-21 DIAGNOSIS — J849 Interstitial pulmonary disease, unspecified: Secondary | ICD-10-CM | POA: Diagnosis not present

## 2018-11-22 NOTE — Progress Notes (Signed)
HPI: FU atrial fibrillation. Patient had episode of atrial fibrillation in February 2015 in the setting of viral gastroenteritis. She had a TEE guided cardioversion. TEE February 2015showed normal LV function, lambl'sexcrescence, mild to moderate left atrial enlargement.Atpreviousoffice visit she was noted to have increasing dyspnea. There was a question of pneumonia on chest x-ray and antibiotics were added. Amiodarone discontinued as well. Significant improvement in densities with antibiotics. High-resolution CT August 2019 showed findings most likely favoring chronic hypersensitivity pneumonitis. Cardioversion of atrial fibrillation January 2020. Echocardiogram repeated February 2020. Normal LV function, mild left atrial enlargement.Chest CT February 2020 showed no pulmonary embolus. There was increase in size of right upper pole kidney cyst possibly complicated by interval hemorrhage. Follow-up recommended with and without contrast or MRI. Patient has undergone cardioversion in June and September. Chest CT November 2020 showed interstitial lung disease possibly UIP.  There was three-vessel coronary disease and calcifications on the aortic and mitral annulus.  Since she was last seenshe has dyspnea on exertion unchanged.  No orthopnea, PND, pedal edema, chest pain or syncope.  No recurrent palpitations.  Current Outpatient Medications  Medication Sig Dispense Refill  . acetaminophen (TYLENOL) 325 MG tablet Take 2 tablets (650 mg total) by mouth every 6 (six) hours as needed for mild pain or headache. 30 tablet 0  . albuterol (PROAIR HFA) 108 (90 Base) MCG/ACT inhaler Inhale 2 puffs into the lungs every 4 (four) hours as needed for wheezing or shortness of breath. 18 g 11  . amiodarone (PACERONE) 200 MG tablet Take one tablet twice daily for 1 week and then decrease to 1 tablet once daily 97 tablet 3  . diltiazem (TIAZAC) 120 MG 24 hr capsule TAKE 1 CAPSULE BY MOUTH DAILY 90 capsule 3   . DULERA 200-5 MCG/ACT AERO INHALE 2 PUFFS INTO THE LUNGS TWICE DAILY 13 g 1  . ELIQUIS 5 MG TABS tablet TAKE 1 TABLET(5 MG) BY MOUTH TWICE DAILY 180 tablet 1  . furosemide (LASIX) 20 MG tablet Take 1 tablet (20 mg total) by mouth daily. Use as needed for swelling (Patient taking differently: Take 20 mg by mouth daily as needed for fluid. Use as needed for swelling) 90 tablet 3  . mirabegron ER (MYRBETRIQ) 25 MG TB24 tablet Take 1 tablet (25 mg total) by mouth daily. Use for bladder symptoms 30 tablet 6  . montelukast (SINGULAIR) 10 MG tablet TAKE 1 TABLET BY MOUTH DAILY 90 tablet 1  . Multiple Vitamins-Minerals (MULTIVITAMIN WITH MINERALS) tablet Take 1 tablet by mouth daily.    . predniSONE (DELTASONE) 10 MG tablet 20mg  daily for 5 days then 10mg  daily for 5 days then stop . 15 tablet 0  . rOPINIRole (REQUIP) 0.5 MG tablet Start with 1/2 tablet at bedtime.  May increase to a whole tablet after 3 days if needed for restless legs 30 tablet 6  . traMADol (ULTRAM) 50 MG tablet 1 tab every 12 hrs as needed for pain. 60 tablet 0   No current facility-administered medications for this visit.      Past Medical History:  Diagnosis Date  . Arthritis   . Asthma   . Atrial fibrillation ([place])    a. s/p DCCV in 2015, on Amiodarone and Eliquis  . CHF (congestive heart failure) ([place])   . COPD (chronic obstructive pulmonary disease) ([place])   . Dysrhythmia    afib -hx   . Neuromuscular disorder ([place])    peripheral neuropathy   . Pneumonia  hx of x 2   . PONV (postoperative nausea and vomiting)   . PVC's (premature ventricular contractions)     Past Surgical History:  Procedure Laterality Date  . APPENDECTOMY    . CARDIOVERSION N/A 03/08/2013   Procedure: CARDIOVERSION;  Surgeon: Larey Dresser, MD;  Location: [facility];  Service: Cardiovascular;  Laterality: N/A;  . CHOLECYSTECTOMY    . oophrectomy    . TEE WITHOUT CARDIOVERSION N/A 03/08/2013   Procedure: TRANSESOPHAGEAL ECHOCARDIOGRAM  (TEE);  Surgeon: Larey Dresser, MD;  Location: [facility];  Service: Cardiovascular;  Laterality: N/A;  . TONSILLECTOMY    . TRANSURETHRAL RESECTION OF BLADDER TUMOR WITH GYRUS (TURBT-GYRUS)  05/13/2016   urology wiht [facility]  . TRANSURETHRAL RESECTION OF BLADDER TUMOR WITH MITOMYCIN-C N/A 12/24/2017   Procedure: TRANSURETHRAL RESECTION OF BLADDER TUMOR;  Surgeon: Lucas Mallow, MD;  Location: [facility];  Service: Urology;  Laterality: N/A;    Social History   Socioeconomic History  . Marital status: Divorced    Spouse name: Not on file  . Number of children: Not on file  . Years of education: Not on file  . Highest education level: Not on file  Occupational History  . Occupation: Retired    Fish farm manager: RETIRED  Social Needs  . Financial resource strain: Not on file  . Food insecurity    Worry: Not on file    Inability: Not on file  . Transportation needs    Medical: Not on file    Non-medical: Not on file  Tobacco Use  . Smoking status: Former Smoker    Packs/day: 1.00    Years: 20.00    Pack years: 20.00    Types: Cigarettes    Quit date: 04/13/1981    Years since quitting: 37.6  . Smokeless tobacco: Never Used  Substance and Sexual Activity  . Alcohol use: Yes    Comment: Occasional  . Drug use: No  . Sexual activity: Never  Lifestyle  . Physical activity    Days per week: Not on file    Minutes per session: Not on file  . Stress: Not on file  Relationships  . Social Herbalist on phone: Not on file    Gets together: Not on file    Attends religious service: Not on file    Active member of club or organization: Not on file    Attends meetings of clubs or organizations: Not on file    Relationship status: Not on file  . Intimate partner violence    Fear of current or ex partner: Not on file    Emotionally abused: Not on file    Physically abused: Not on file    Forced sexual activity: Not on file  Other Topics Concern  . Not on file  Social  History Narrative  . Not on file    Family History  Problem Relation Age of Onset  . Heart failure Mother   . Diabetes Father   . Heart disease Other        No family history    ROS: no fevers or chills, productive cough, hemoptysis, dysphasia, odynophagia, melena, hematochezia, dysuria, hematuria, rash, seizure activity, orthopnea, PND, pedal edema, claudication. Remaining systems are negative.  Physical Exam: Well-developed well-nourished in no acute distress.  Skin is warm and dry.  HEENT is normal.  Neck is supple.  Chest is clear to auscultation with normal expansion.  Cardiovascular exam is regular rate and rhythm.  Abdominal  exam nontender or distended. No masses palpated. Extremities show no edema. neuro grossly intact  A/P  1 paroxysmal atrial fibrillation-patient remains in sinus rhythm on examination and no recurrences.  Continue Cardizem and apixaban.  Patient is extremely symptomatic with recurrent atrial fibrillation episodes.  I previously discussed the options with the patient and we elected to resume amiodarone as she did not want to be hospitalized for Tikosyn.  I am concerned about the progression of lung disease noted on her CT scan.  I would prefer to discontinue amiodarone but she states she wants to continue for now.  I will ask the atrial fibrillation clinic to review for options concerning antiarrhythmics.  She is not a candidate for flecainide as she is noted to have three-vessel coronary calcification on CT scan.  Patient now states that she would be willing to consider Tikosyn or sotalol.  However we would need to discontinue amiodarone prior to consideration of these medications.  2 chronic interstitial lung disease-worsening on most recent CT scan.  As outlined above.  Follow-up pulmonary.  3 hypertension-blood pressure controlled.  Continue present medications and follow.  4 chronic diastolic congestive heart failure-volume status appears to be  reasonable.  Continue present dose of diuretic.  5 history of kidney cyst-follow-up urology.  Kirk Ruths, MD

## 2018-11-25 ENCOUNTER — Other Ambulatory Visit: Payer: Self-pay

## 2018-11-25 ENCOUNTER — Encounter: Payer: Self-pay | Admitting: Adult Health

## 2018-11-25 ENCOUNTER — Ambulatory Visit (INDEPENDENT_AMBULATORY_CARE_PROVIDER_SITE_OTHER): Payer: Medicare Other | Admitting: Adult Health

## 2018-11-25 DIAGNOSIS — I4819 Other persistent atrial fibrillation: Secondary | ICD-10-CM

## 2018-11-25 DIAGNOSIS — J4530 Mild persistent asthma, uncomplicated: Secondary | ICD-10-CM

## 2018-11-25 DIAGNOSIS — J849 Interstitial pulmonary disease, unspecified: Secondary | ICD-10-CM

## 2018-11-25 MED ORDER — PREDNISONE 10 MG PO TABS: ORAL_TABLET | ORAL | 0 refills | Status: DC

## 2018-11-25 NOTE — Patient Instructions (Addendum)
Continue on Dulera 276mcg 2 puffs Twice daily  , rinse after use. Prednisone 20mg  daily for 5 days then 10mg  daily for 5 days then stop .  Mucinex DM Twice daily  As needed  Cough/congestion  Low salt diet  Take Lasix 20mg  daily for 3 days then As needed  For swelling .  Follow up with Cardiology next week as planned  Follow up in 2 weeks with video visit with Dr. [name] or Salisa Broz NP and As needed   Please contact office for sooner follow up if symptoms do not improve or worsen or seek emergency care

## 2018-11-25 NOTE — Progress Notes (Signed)
Reviewed and agree with assessment/plan.   Raine Blodgett, MD [facility] 01/08/2016, 12:24 PM Pager:  [phone]  

## 2018-11-25 NOTE — Progress Notes (Signed)
Virtual Visit via Telephone Note  I connected with Crystal Kerr on 11/25/18 at 11:00 AM EST by telephone and verified that I am speaking with the correct person using two identifiers.  Location: Patient: Home  Provider: Office    I discussed the limitations, risks, security and privacy concerns of performing an evaluation and management service by telephone and the availability of in person appointments. I also discussed with the patient that there may be a patient responsible charge related to this service. The patient expressed understanding and agreed to proceed.   History of Present Illness: 85 year old female former smoker, quit 1983, seen in May 2018 after a pneumonia complicated by rhinovirus CT chest showed groundglass opacities and clustered nodules consistent with an acute pneumonia-larger nodule with possible area of cavitation In February 2019 treated for possible amiodarone lung toxicity Medical history significant for A. 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 Patient is accompanied by her daughter-Crystal Kerr for today's visit   Patient was last seen in February 2020.  She says she was doing okay with her breathing overall except she has been having difficulty with her underlying A. Fib.  She has had  unsuccessful cardioversions in June and September this year.  Says during her episodes of A. fib she has significant fatigue and shortness of breath.. Patient was restarted on amiodarone by cardiology early last month.  A follow-up CT chest was done November 21, 2018 that showed patchy areas of groundglass attenuation and septal thickening with some thickening and interstitial.  Favored to represent chronic hypersensitivity pneumonitis-this is similar to her scan in August 2019.  She denies any increased cough.  Has no discolored mucus no fever.  No  increased leg swelling orthopnea.      Observations/Objective:  TEST/EVENTS : Echo 2015 EF 65%, Gr 2 DD , Mod Dilated RA  PFTs 03/20/14 FEV1 1.28 (85%) ratio 75 s resp to saba and dlco 68 corrects to 103  CT chest Jun 02, 2016 showed groundglass opacities and clustered nodules compatible with an acute pneumonia. There is suggestion of cavitation with a larger nodules although this is difficult to distinguish from the bronchiectatic changes.  CT chest September 22, 2016 showed market improvement in the multifocal bilateralcentrallyairspace disease on previous study.  02/2017 -Tx for PNA andpossible amiodarone pulmonary toxicity. Elevated sed rate at 99. Amiodarone was discontinued  High-resolution CT chest February 2019>extensive patchy groundglass opacities throughout both lungs involving all lung lobes largely new since September 2018 with mild mediastinal lymphadenopathy  HRCT August 17, 2017>shows some residual groundglass attenuation and scattered areas of mild bronchiectasis. No honeycombing. Several regions of groundglass attenuation on prior study have resolved.  HRCT 11/2018-patchy areas of ground-glass attenuation and septal thickening with some thickening of the peribronchovascular interstitium and areas of mild cylindrical bronchiectasis.  Assessment and Plan: ILD -patient has known groundglass attenuation and mild bronchiectasis noted on high-resolution CT chest.  That has been present dating back to 2018.  She has had some interval improvement on CT in August 2019.  She also had clinical improvement after antibiotics and after discontinuation of amiodarone.  It is difficult to determine if in fact she had amiodarone pulmonary toxicity however did have clinical improvement and resolution of several regions of groundglass attenuation on previous study following discontinuation.   Currently she has fatigue and shortness of breath that seems to be  exacerbated by  her A. fib episodes.  She is now back on a amiodarone.  We will discussed with radiologist to compare her most recent high-resolution CT chest with her previous high-resolution CT chest in August 2019 to make sure these do not see any significant progression as she is now back on amiodarone.. It is difficult because patient has failed cardioversion and previous treatment options for her A. Fib.  It would be better going forward not to use amiodarone however if unable to control A. fib this may be her only option. We will leave that to cardiology but obviously would choose an alternative agent if possible  For now we will give a short low-dose prednisone course.  Hold on antibiotics as has no current symptoms of pneumonia.  No acute process seen on most recent CT chest. We will have her follow-up in 2 weeks for evaluation. Discussed in an office visit but patient and daughter decline due to the COVID-19 pandemic  Plan  Patient Instructions  Continue on Dulera 242mcg 2 puffs Twice daily  , rinse after use. Prednisone 20mg  daily for 5 days then 10mg  daily for 5 days then stop .  Mucinex DM Twice daily  As needed  Cough/congestion  Low salt diet  Take Lasix 20mg  daily for 3 days then As needed  For swelling .  Follow up with Cardiology next week as planned  Follow up in 2 weeks with video visit with Dr. [name] or Zanyia Silbaugh NP and As needed   Please contact office for sooner follow up if symptoms do not improve or worsen or seek emergency care        Follow Up Instructions: Follow-up in 2 weeks and as needed   I discussed the assessment and treatment plan with the patient. The patient was provided an opportunity to ask questions and all were answered. The patient agreed with the plan and demonstrated an understanding of the instructions.   The patient was advised to call back or seek an in-person evaluation if the symptoms worsen or if the condition fails to improve as  anticipated.  I provided 40  minutes of non-face-to-face time during this encounter.   [name], NP

## 2018-11-30 ENCOUNTER — Other Ambulatory Visit: Payer: Self-pay

## 2018-11-30 ENCOUNTER — Encounter: Payer: Self-pay | Admitting: Cardiology

## 2018-11-30 ENCOUNTER — Ambulatory Visit (INDEPENDENT_AMBULATORY_CARE_PROVIDER_SITE_OTHER): Payer: Medicare Other | Admitting: Cardiology

## 2018-11-30 VITALS — BP 144/86 | HR 58 | Ht 63.0 in | Wt 156.8 lb

## 2018-11-30 DIAGNOSIS — I480 Paroxysmal atrial fibrillation: Secondary | ICD-10-CM

## 2018-11-30 DIAGNOSIS — J849 Interstitial pulmonary disease, unspecified: Secondary | ICD-10-CM

## 2018-11-30 DIAGNOSIS — I10 Essential (primary) hypertension: Secondary | ICD-10-CM | POA: Diagnosis not present

## 2018-11-30 DIAGNOSIS — I5032 Chronic diastolic (congestive) heart failure: Secondary | ICD-10-CM | POA: Diagnosis not present

## 2018-11-30 NOTE — Patient Instructions (Signed)
Medication Instructions:  NO CHANGE *If you need a refill on your cardiac medications before your next appointment, please call your pharmacy*  Lab Work: If you have labs (blood work) drawn today and your tests are completely normal, you will receive your results only by: Marland Kitchen MyChart Message (if you have MyChart) OR . A paper copy in the mail If you have any lab test that is abnormal or we need to change your treatment, we will call you to review the results.  Follow-Up: At [facility], you and your health needs are our priority.  As part of our continuing mission to provide you with exceptional heart care, we have created designated Provider Care Teams.  These Care Teams include your primary Cardiologist (physician) and Advanced Practice Providers (APPs -  Physician Assistants and Nurse Practitioners) who all work together to provide you with the care you need, when you need it.  Your next appointment:   3 month(s)  The format for your next appointment:   In Person  Provider:   Kirk Ruths, MD  Other Instructions REFERRAL TO [place]

## 2018-12-01 ENCOUNTER — Telehealth: Payer: Self-pay | Admitting: Adult Health

## 2018-12-01 DIAGNOSIS — J4530 Mild persistent asthma, uncomplicated: Secondary | ICD-10-CM

## 2018-12-01 DIAGNOSIS — J849 Interstitial pulmonary disease, unspecified: Secondary | ICD-10-CM

## 2018-12-01 MED ORDER — DULERA 200-5 MCG/ACT IN AERO: 2.0000 | INHALATION_SPRAY | Freq: Two times a day (BID) | RESPIRATORY_TRACT | 6 refills | Status: DC

## 2018-12-01 NOTE — Telephone Encounter (Signed)
Patient last visit on 11/25/18 stated patient was to continue with usage of Dulera.   Prescription sent to the pharmacy. Nothing further needed at this time.

## 2018-12-04 ENCOUNTER — Other Ambulatory Visit: Payer: Self-pay | Admitting: Family Medicine

## 2018-12-04 DIAGNOSIS — R350 Frequency of micturition: Secondary | ICD-10-CM

## 2018-12-14 ENCOUNTER — Telehealth (HOSPITAL_COMMUNITY): Payer: Self-pay | Admitting: Physician Assistant

## 2018-12-14 NOTE — Telephone Encounter (Signed)
Called and left message for pt/daughter to call back.  Pt needs to set up appt here per Neoma Laming with Dr. Jacalyn Lefevre office.

## 2018-12-14 NOTE — Telephone Encounter (Signed)
Patient's daughter returned call and is aware of appt 12/20/18 with Adline Peals.

## 2018-12-19 DIAGNOSIS — C679 Malignant neoplasm of bladder, unspecified: Secondary | ICD-10-CM | POA: Diagnosis not present

## 2018-12-19 DIAGNOSIS — C678 Malignant neoplasm of overlapping sites of bladder: Secondary | ICD-10-CM | POA: Diagnosis not present

## 2018-12-20 ENCOUNTER — Encounter (HOSPITAL_COMMUNITY): Payer: Self-pay | Admitting: Physician Assistant

## 2018-12-20 ENCOUNTER — Ambulatory Visit (HOSPITAL_COMMUNITY)
Admission: RE | Admit: 2018-12-20 | Discharge: 2018-12-20 | Disposition: A | Discharge status: Home / Self Care | Payer: Medicare Other | Source: Ambulatory Visit | Attending: Physician Assistant | Admitting: Physician Assistant

## 2018-12-20 ENCOUNTER — Other Ambulatory Visit: Payer: Self-pay

## 2018-12-20 VITALS — BP 166/60 | HR 66 | Ht 63.0 in | Wt 162.6 lb

## 2018-12-20 DIAGNOSIS — Z87891 Personal history of nicotine dependence: Secondary | ICD-10-CM | POA: Insufficient documentation

## 2018-12-20 DIAGNOSIS — Z79899 Other long term (current) drug therapy: Secondary | ICD-10-CM | POA: Insufficient documentation

## 2018-12-20 DIAGNOSIS — Z7901 Long term (current) use of anticoagulants: Secondary | ICD-10-CM | POA: Diagnosis not present

## 2018-12-20 DIAGNOSIS — I110 Hypertensive heart disease with heart failure: Secondary | ICD-10-CM | POA: Insufficient documentation

## 2018-12-20 DIAGNOSIS — I2510 Atherosclerotic heart disease of native coronary artery without angina pectoris: Secondary | ICD-10-CM | POA: Diagnosis not present

## 2018-12-20 DIAGNOSIS — I5032 Chronic diastolic (congestive) heart failure: Secondary | ICD-10-CM | POA: Insufficient documentation

## 2018-12-20 DIAGNOSIS — D6869 Other thrombophilia: Secondary | ICD-10-CM | POA: Diagnosis not present

## 2018-12-20 DIAGNOSIS — I480 Paroxysmal atrial fibrillation: Secondary | ICD-10-CM | POA: Insufficient documentation

## 2018-12-20 DIAGNOSIS — J449 Chronic obstructive pulmonary disease, unspecified: Secondary | ICD-10-CM | POA: Insufficient documentation

## 2018-12-20 DIAGNOSIS — Z8249 Family history of ischemic heart disease and other diseases of the circulatory system: Secondary | ICD-10-CM | POA: Insufficient documentation

## 2018-12-20 NOTE — Progress Notes (Signed)
Primary Care Physician: [name], MD Primary Cardiologist: Dr [name] Primary Electrophysiologist: none Referring Physician: Dr Remo Lipps Crystal Kerr is a 85 y.o. female with a history of paroxysmal atrial fibrillation, ILD possibly related to amiodarone, HTN, chronic diastolic CHF, and COPD who presents for consultation in the [facility].  The patient was initially diagnosed with atrial fibrillation in 2015 in the setting of viral gastroenteritis and had TEE/DCCV at that time. She is on Eliquis for a CHADS2VASC score of 5. Patient had been maintained on amiodarone and underwent cardioversions in January, June, and September of 2020. She is followed by pulmonology for her ILD. She denies any significant snoring or alcohol use.  Today, she denies symptoms of palpitations, chest pain, shortness of breath, orthopnea, PND, lower extremity edema, dizziness, presyncope, syncope, snoring, daytime somnolence, bleeding, or neurologic sequela. The patient is tolerating medications without difficulties and is otherwise without complaint today.    Atrial Fibrillation Risk Factors:  she does not have symptoms or diagnosis of sleep apnea. she does not have a history of rheumatic fever. she does not have a history of alcohol use. The patient does not have a history of early familial atrial fibrillation or other arrhythmias.  she has a BMI of Body mass index is 28.8 kg/m.Marland Kitchen Filed Weights   12/20/18 0941  Weight: 73.8 kg    Family History  Problem Relation Age of Onset  . Heart failure Mother   . Diabetes Father   . Heart disease Other        No family history     Atrial Fibrillation Management history:  Previous antiarrhythmic drugs: amiodarone Previous cardioversions: 2015, 01/2018, 06/2018, 09/2018 Previous ablations: none CHADS2VASC score: 5 Anticoagulation history: Eliquis   Past Medical History:  Diagnosis Date  . Arthritis   .  Asthma   . Atrial fibrillation ([place])    a. s/p DCCV in 2015, on Amiodarone and Eliquis  . CHF (congestive heart failure) ([place])   . COPD (chronic obstructive pulmonary disease) ([place])   . Dysrhythmia    afib -hx   . Neuromuscular disorder ([place])    peripheral neuropathy   . Pneumonia    hx of x 2   . PONV (postoperative nausea and vomiting)   . PVC's (premature ventricular contractions)    Past Surgical History:  Procedure Laterality Date  . APPENDECTOMY    . CARDIOVERSION N/A 03/08/2013   Procedure: CARDIOVERSION;  Surgeon: Larey Dresser, MD;  Location: [place];  Service: Cardiovascular;  Laterality: N/A;  . CHOLECYSTECTOMY    . oophrectomy    . TEE WITHOUT CARDIOVERSION N/A 03/08/2013   Procedure: TRANSESOPHAGEAL ECHOCARDIOGRAM (TEE);  Surgeon: Larey Dresser, MD;  Location: [place];  Service: Cardiovascular;  Laterality: N/A;  . TONSILLECTOMY    . TRANSURETHRAL RESECTION OF BLADDER TUMOR WITH GYRUS (TURBT-GYRUS)  05/13/2016   urology wiht [facility]  . TRANSURETHRAL RESECTION OF BLADDER TUMOR WITH MITOMYCIN-C N/A 12/24/2017   Procedure: TRANSURETHRAL RESECTION OF BLADDER TUMOR;  Surgeon: Lucas Mallow, MD;  Location: [facility];  Service: Urology;  Laterality: N/A;    Current Outpatient Medications  Medication Sig Dispense Refill  . acetaminophen (TYLENOL) 325 MG tablet Take 2 tablets (650 mg total) by mouth every 6 (six) hours as needed for mild pain or headache. 30 tablet 0  . albuterol (PROAIR HFA) 108 (90 Base) MCG/ACT inhaler Inhale 2 puffs into the lungs every 4 (four) hours as needed for wheezing or  shortness of breath. 18 g 11  . diltiazem (TIAZAC) 120 MG 24 hr capsule TAKE 1 CAPSULE BY MOUTH DAILY 90 capsule 3  . ELIQUIS 5 MG TABS tablet TAKE 1 TABLET(5 MG) BY MOUTH TWICE DAILY 180 tablet 1  . furosemide (LASIX) 20 MG tablet Take 1 tablet (20 mg total) by mouth daily. Use as needed for swelling (Patient taking differently: Take 20 mg by mouth daily as needed for  fluid. Use as needed for swelling) 90 tablet 3  . mometasone-formoterol (DULERA) 200-5 MCG/ACT AERO Inhale 2 puffs into the lungs 2 (two) times daily. 1 g 6  . montelukast (SINGULAIR) 10 MG tablet TAKE 1 TABLET BY MOUTH DAILY 90 tablet 1  . Multiple Vitamins-Minerals (MULTIVITAMIN WITH MINERALS) tablet Take 1 tablet by mouth daily.    Marland Kitchen MYRBETRIQ 25 MG TB24 tablet TAKE 1 TABLET BY MOUTH DAILY FOR BLADDER SYMPTOMS 30 tablet 6  . rOPINIRole (REQUIP) 0.5 MG tablet Start with 1/2 tablet at bedtime.  May increase to a whole tablet after 3 days if needed for restless legs 30 tablet 6  . traMADol (ULTRAM) 50 MG tablet 1 tab every 12 hrs as needed for pain. 60 tablet 0   No current facility-administered medications for this encounter.     No Known Allergies  Social History   Socioeconomic History  . Marital status: Divorced    Spouse name: Not on file  . Number of children: Not on file  . Years of education: Not on file  . Highest education level: Not on file  Occupational History  . Occupation: Retired    Fish farm manager: RETIRED  Social Needs  . Financial resource strain: Not on file  . Food insecurity    Worry: Not on file    Inability: Not on file  . Transportation needs    Medical: Not on file    Non-medical: Not on file  Tobacco Use  . Smoking status: Former Smoker    Packs/day: 1.00    Years: 20.00    Pack years: 20.00    Types: Cigarettes    Quit date: 04/13/1981    Years since quitting: 37.7  . Smokeless tobacco: Never Used  Substance and Sexual Activity  . Alcohol use: Not Currently    Comment: Occasional  . Drug use: No  . Sexual activity: Never  Lifestyle  . Physical activity    Days per week: Not on file    Minutes per session: Not on file  . Stress: Not on file  Relationships  . Social Herbalist on phone: Not on file    Gets together: Not on file    Attends religious service: Not on file    Active member of club or organization: Not on file    Attends  meetings of clubs or organizations: Not on file    Relationship status: Not on file  . Intimate partner violence    Fear of current or ex partner: Not on file    Emotionally abused: Not on file    Physically abused: Not on file    Forced sexual activity: Not on file  Other Topics Concern  . Not on file  Social History Narrative  . Not on file     ROS- All systems are reviewed and negative except as per the HPI above.  Physical Exam: Vitals:   12/20/18 0941  BP: (!) 166/60  Pulse: 66  Weight: 73.8 kg  Height: 5\' 3"  (1.6 m)  GEN- The patient is well appearing elderly female, alert and oriented x 3 today.   Head- normocephalic, atraumatic Eyes-  Sclera clear, conjunctiva pink Ears- hearing intact Oropharynx- clear Neck- supple  Lungs- Clear to ausculation bilaterally, normal work of breathing Heart- Regular rate and rhythm, no murmurs, rubs or gallops  GI- soft, NT, ND, + BS Extremities- no clubbing, cyanosis, or edema MS- no significant deformity or atrophy Skin- no rash or lesion Psych- euthymic mood, full affect Neuro- strength and sensation are intact  Wt Readings from Last 3 Encounters:  12/20/18 73.8 kg  11/30/18 71.1 kg  10/12/18 70.8 kg    EKG today demonstrates SR HR 66, 1st degree AV block, PR 208, QRS 76, QTc 442  Echo 2//20 demonstrated   1. The left ventricle has hyperdynamic systolic function of 123XX123. The cavity size is normal. There is no left ventricular wall thickness. Echo evidence of impaired relaxation diastolic filling patterns.  2. Mildly dilated left atrial size.  3. Normal right atrial size.  4. Trivial pericardial effusion, as described above.  5. Normal tricuspid valve.  6. The aortic valve tricuspid. There is moderate thickening and moderate sclerosis of the aortic valve.  7. No atrial level shunt detected by color flow Doppler.  Epic records are reviewed at length today  Assessment and Plan:  1. Paroxysmal atrial fibrillation  S/p several DCCV, most recently 09/30/18. Patient has been maintained on amiodarone but there is concern for amiodarone lung toxicity.  We discussed therapeutic options today including changing AAD vs rate control. Patient reports that she has very low quality of life when she is in afib and would like to continue to pursue a rhythm strategy.  Will stop amiodarone today.  After discussing the risks and benefits, will plan to load dofetilide after 3 month washout of amiodarone. QTc in SR 429-442 ms. Continue Eliquis 5 mg BID Continue diltiazem 120 mg daily  This patients CHA2DS2-VASc Score and unadjusted Ischemic Stroke Rate (% per year) is equal to 7.2 % stroke rate/year from a score of 5  Above score calculated as 1 point each if present [CHF, HTN, DM, Vascular=MI/PAD/Aortic Plaque, Age if 65-74, or Female] Above score calculated as 2 points each if present [Age > 75, or Stroke/TIA/TE]   2. HTN Mildly elevated today. Has been well controlled at previous visits and at home. No changes today.  3. CAD Three vessel coronary calcification seen on CT. Followed by Dr [name].   Follow up in the AF clinic in 6 weeks.   [facility] [address] [place], [place] [address] [phone] 12/20/2018 10:08 AM

## 2018-12-20 NOTE — Patient Instructions (Signed)
Stop Amiodarone 

## 2018-12-22 DIAGNOSIS — C678 Malignant neoplasm of overlapping sites of bladder: Secondary | ICD-10-CM | POA: Diagnosis not present

## 2019-01-20 ENCOUNTER — Encounter: Payer: Self-pay | Admitting: Family Medicine

## 2019-01-20 DIAGNOSIS — M25552 Pain in left hip: Secondary | ICD-10-CM

## 2019-01-23 MED ORDER — TRAMADOL HCL 50 MG PO TABS: ORAL_TABLET | ORAL | 0 refills | Status: DC

## 2019-01-31 ENCOUNTER — Ambulatory Visit (HOSPITAL_COMMUNITY)
Admission: RE | Admit: 2019-01-31 | Discharge: 2019-01-31 | Disposition: A | Discharge status: Home / Self Care | Payer: Medicare Other | Source: Ambulatory Visit | Attending: Physician Assistant | Admitting: Physician Assistant

## 2019-01-31 ENCOUNTER — Other Ambulatory Visit: Payer: Self-pay

## 2019-01-31 DIAGNOSIS — I480 Paroxysmal atrial fibrillation: Secondary | ICD-10-CM | POA: Diagnosis not present

## 2019-01-31 DIAGNOSIS — D6869 Other thrombophilia: Secondary | ICD-10-CM

## 2019-01-31 NOTE — Progress Notes (Signed)
Electrophysiology TeleHealth Note   Due to national recommendations of social distancing due to [place]ie 19, Audio telehealth visit is felt to be most appropriate for this patient at this time.  See consent below from today for patient consent regarding telehealth for the Atrial Fibrillation Clinic. Consent obtained verbally.   Date:  01/31/2019   ID:  Crystal Kerr, DOB 10/15/1924, MRN [id number]  Location: home  Provider location: [address] [place], [place] [address] Evaluation Performed: Follow up  PCP:  Copland, Gay Filler, MD  Primary Cardiologist:  Dr [name] Primary Electrophysiologist: none Referring Physician: Dr [name]   CC: Follow up for paroxysmal atrial fibrillation   History of Present Illness: Crystal Kerr is a 86 y.o. 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  The patient was initially diagnosed with atrial fibrillation in 2015 in the setting of viral gastroenteritis and had TEE/DCCV at that time. She is on Eliquis for a CHADS2VASC score of 5. Patient had been maintained on amiodarone and underwent cardioversions in January, June, and September of 2020. She is followed by pulmonology for her ILD. She denies any significant snoring or alcohol use.   On follow up today, patient reports that she has done well since her last visit. She has not had any episodes of afib. She actually states that her breathing has improved somewhat since stopping the amiodarone.  Today, she denies symptoms of palpitations, chest pain, shortness of breath, orthopnea, PND, lower extremity edema, claudication, dizziness, presyncope, syncope, bleeding, or neurologic sequela. The patient is tolerating medications without difficulties and is otherwise without complaint today.     Atrial Fibrillation Risk  Factors:  she does not have symptoms or diagnosis of sleep apnea. she does not have a history of rheumatic fever. she does not have a history of alcohol use. The patient does not have a history of early familial atrial fibrillation or other arrhythmias.  she has a BMI of There is no height or weight on file to calculate BMI.. There were no vitals filed for this visit.  Patient plans to purchase BP machine to track BP and heart rate at home.   Atrial Fibrillation Management history:  Previous antiarrhythmic drugs: amiodarone Previous cardioversions: 2015, 01/2018, 06/2018, 09/2018 Previous ablations: none CHADS2VASC score: 5 Anticoagulation history: Eliquis   Past Medical History:  Diagnosis Date  . Arthritis   . Asthma   . Atrial fibrillation ([place])    a. s/p DCCV in 2015, on Amiodarone and Eliquis  . CHF (congestive heart failure) ([place])   . COPD (chronic obstructive pulmonary disease) ([place])   . Dysrhythmia    afib -hx   . Neuromuscular disorder ([place])    peripheral neuropathy   . Pneumonia    hx of x 2   . PONV (postoperative nausea and vomiting)   . PVC's (premature ventricular contractions)    Past Surgical History:  Procedure Laterality Date  . APPENDECTOMY    . CARDIOVERSION N/A 03/08/2013   Procedure: CARDIOVERSION;  Surgeon: Larey Dresser, MD;  Location: [place];  Service: Cardiovascular;  Laterality: N/A;  . CHOLECYSTECTOMY    . oophrectomy    . TEE WITHOUT CARDIOVERSION N/A 03/08/2013   Procedure: TRANSESOPHAGEAL ECHOCARDIOGRAM (TEE);  Surgeon: Larey Dresser, MD;  Location: [place] [facility];  Service: Cardiovascular;  Laterality: N/A;  . TONSILLECTOMY    .  TRANSURETHRAL RESECTION OF BLADDER TUMOR WITH GYRUS (TURBT-GYRUS)  05/13/2016   urology wiht [facility]  . TRANSURETHRAL RESECTION OF BLADDER TUMOR WITH MITOMYCIN-C N/A 12/24/2017   Procedure: TRANSURETHRAL RESECTION OF BLADDER TUMOR;  Surgeon: Lucas Mallow, MD;  [facility];  Service: Urology;   Laterality: N/A;     Current Outpatient Medications  Medication Sig Dispense Refill  . acetaminophen (TYLENOL) 325 MG tablet Take 2 tablets (650 mg total) by mouth every 6 (six) hours as needed for mild pain or headache. 30 tablet 0  . albuterol (PROAIR HFA) 108 (90 Base) MCG/ACT inhaler Inhale 2 puffs into the lungs every 4 (four) hours as needed for wheezing or shortness of breath. 18 g 11  . diltiazem (TIAZAC) 120 MG 24 hr capsule TAKE 1 CAPSULE BY MOUTH DAILY 90 capsule 3  . ELIQUIS 5 MG TABS tablet TAKE 1 TABLET(5 MG) BY MOUTH TWICE DAILY 180 tablet 1  . furosemide (LASIX) 20 MG tablet Take 1 tablet (20 mg total) by mouth daily. Use as needed for swelling (Patient taking differently: Take 20 mg by mouth daily as needed for fluid. Use as needed for swelling) 90 tablet 3  . mometasone-formoterol (DULERA) 200-5 MCG/ACT AERO Inhale 2 puffs into the lungs 2 (two) times daily. 1 g 6  . montelukast (SINGULAIR) 10 MG tablet TAKE 1 TABLET BY MOUTH DAILY 90 tablet 1  . Multiple Vitamins-Minerals (MULTIVITAMIN WITH MINERALS) tablet Take 1 tablet by mouth daily.    Marland Kitchen MYRBETRIQ 25 MG TB24 tablet TAKE 1 TABLET BY MOUTH DAILY FOR BLADDER SYMPTOMS 30 tablet 6  . rOPINIRole (REQUIP) 0.5 MG tablet Start with 1/2 tablet at bedtime.  May increase to a whole tablet after 3 days if needed for restless legs 30 tablet 6  . traMADol (ULTRAM) 50 MG tablet 1 tab every 12 hrs as needed for pain. 60 tablet 0   No current facility-administered medications for this visit.    Allergies:   Patient has no known allergies.   Social History:  The patient  reports that she quit smoking about 37 years ago. Her smoking use included cigarettes. She has a 20.00 pack-year smoking history. She has never used smokeless tobacco. She reports previous alcohol use. She reports that she does not use drugs.   Family History:  The patient's  family history includes Diabetes in her father; Heart disease in an other family member; Heart  failure in her mother.    ROS:  Please see the history of present illness.   All other systems are personally reviewed and negative.    Recent Labs: 02/13/2018: B Natriuretic Peptide 74.2 02/15/2018: ALT 22 09/30/2018: BUN 26; Creatinine, Ser 0.83; Hemoglobin 13.4; Magnesium 2.0; Platelets 263; Potassium 4.2; Sodium 137; TSH 1.388  personally reviewed    Other studies personally reviewed: Additional studies/ records that were reviewed today include: Epic notes, echocardiogram  Echo 2//20 demonstrated  1. The left ventricle has hyperdynamic systolic function of 123XX123. The cavity size is normal. There is no left ventricular wall thickness. Echo evidence of impaired relaxation diastolic filling patterns. 2. Mildly dilated left atrial size. 3. Normal right atrial size. 4. Trivial pericardial effusion, as described above. 5. Normal tricuspid valve. 6. The aortic valve tricuspid. There is moderate thickening and moderate sclerosis of the aortic valve. 7. No atrial level shunt detected by color flow Doppler.   ASSESSMENT AND PLAN:  1. Paroxysmal atrial fibrillation S/p several DCCVs, most recently 09/30/18. Amiodarone stopped 12/20/18 for concern of amiodarone lung  toxicity.  Patient appears to be maintaining SR clinically.  Plan for dofetilide loading once 3 month washout of amiodarone is complete. QTc in SR 429-442 ms Continue Eliquis 5 mg BID Continue diltiazem 120 mg daily  This patients CHA2DS2-VASc Score and unadjusted Ischemic Stroke Rate (% per year) is equal to 7.2 % stroke rate/year from a score of 5  Above score calculated as 1 point each if present [CHF, HTN, DM, Vascular=MI/PAD/Aortic Plaque, Age if 65-74, or Female] Above score calculated as 2 points each if present [Age > 75, or Stroke/TIA/TE]  2. HTN No changes today.  3. CAD Three vessel coronary calcification seen on CT. No anginal symptoms.  Followed by Dr [name].   Follow-up with Dr [name] as  scheduled. AF clinic once patient is ready for dofetilide loading.   Current medicines are reviewed at length with the patient today.   The patient does not have concerns regarding her medicines.  The following changes were made today:  none  Labs/ tests ordered today include:  No orders of the defined types were placed in this encounter.   Patient Risk:  after full review of this patients clinical status, I feel that they are at moderate risk at this time.   Today, I have spent 8 minutes with the patient with telehealth technology discussing the above.    Gwenlyn Perking PA-C 01/31/2019 8:42 AM  Afib [facility] [place], [place] [address] [phone]   I hereby voluntarily request, consent and authorize the [facility] and its employed or contracted physicians, physician assistants, nurse practitioners or other licensed health care professionals (the Practitioner), to provide me with telemedicine health care services (the "Services") as deemed necessary by the treating Practitioner. I acknowledge and consent to receive the Services by the Practitioner via telemedicine. I understand that the telemedicine visit will involve communicating with the Practitioner through live audiovisual communication technology and the disclosure of certain medical information by electronic transmission. I acknowledge that I have been given the opportunity to request an in-person assessment or other available alternative prior to the telemedicine visit and am voluntarily participating in the telemedicine visit.   I understand that I have the right to withhold or withdraw my consent to the use of telemedicine in the course of my care at any time, without affecting my right to future care or treatment, and that the Practitioner or I may terminate the telemedicine visit at any time. I understand that I have the right to inspect all information obtained and/or  recorded in the course of the telemedicine visit and may receive copies of available information for a reasonable fee.  I understand that some of the potential risks of receiving the Services via telemedicine include:   Delay or interruption in medical evaluation due to technological equipment failure or disruption;  Information transmitted may not be sufficient (e.g. poor resolution of images) to allow for appropriate medical decision making by the Practitioner; and/or  In rare instances, security protocols could fail, causing a breach of personal health information.   Furthermore, I acknowledge that it is my responsibility to provide information about my medical history, conditions and care that is complete and accurate to the best of my ability. I acknowledge that Practitioner's advice, recommendations, and/or decision may be based on factors not within their control, such as incomplete or inaccurate data provided by me or distortions of diagnostic images or specimens that may result from electronic transmissions. I understand that the  practice of medicine is not an Chief Strategy Officer and that Practitioner makes no warranties or guarantees regarding treatment outcomes. I acknowledge that I will receive a copy of this consent concurrently upon execution via email to the email address I last provided but may also request a printed copy by calling the office of the [facility].  I understand that my insurance will be billed for this visit.   I have read or had this consent read to me.  I understand the contents of this consent, which adequately explains the benefits and risks of the Services being provided via telemedicine.  I have been provided ample opportunity to ask questions regarding this consent and the Services and have had my questions answered to my satisfaction.  I give my informed consent for the services to be provided through the use of telemedicine in my medical care  By  participating in this telemedicine visit I agree to the above.

## 2019-02-07 ENCOUNTER — Ambulatory Visit: Payer: Medicare Other

## 2019-02-15 NOTE — Progress Notes (Signed)
HPI: FU atrial fibrillation.  Patient has had symptomatic paroxysmal atrial fibrillation in the past. TEE February 2015showed normal LV function, lambl'sexcrescence, mild to moderate left atrial enlargement.Echocardiogram repeated February 2020. Normal LV function, mild left atrial enlargement.Chest CT February 2020 showed no pulmonary embolus. There was increase in size of right upper pole kidney cyst possibly complicated by interval hemorrhage. Follow-up recommended with and without contrast or MRI.Chest CT November 2020 showed interstitial lung disease possibly UIP.  There was three-vessel coronary disease and calcifications on the aortic and mitral annulus. I was concerned about continuing amiodarone in the setting of lung disease. She was seen in the atrial fibrillation clinic and amiodarone was discontinued with plans for Tikosyn once amiodarone washed out. Since she was last seenpatient denies dyspnea, chest pain, palpitations, syncope or bleeding.  Current Outpatient Medications  Medication Sig Dispense Refill  . acetaminophen (TYLENOL) 325 MG tablet Take 2 tablets (650 mg total) by mouth every 6 (six) hours as needed for mild pain or headache. 30 tablet 0  . albuterol (PROAIR HFA) 108 (90 Base) MCG/ACT inhaler Inhale 2 puffs into the lungs every 4 (four) hours as needed for wheezing or shortness of breath. 18 g 11  . diltiazem (TIAZAC) 120 MG 24 hr capsule TAKE 1 CAPSULE BY MOUTH DAILY 90 capsule 3  . ELIQUIS 5 MG TABS tablet TAKE 1 TABLET(5 MG) BY MOUTH TWICE DAILY 180 tablet 1  . furosemide (LASIX) 20 MG tablet Take 1 tablet (20 mg total) by mouth daily. Use as needed for swelling (Patient taking differently: Take 20 mg by mouth daily as needed for fluid. Use as needed for swelling) 90 tablet 3  . mometasone-formoterol (DULERA) 200-5 MCG/ACT AERO Inhale 2 puffs into the lungs 2 (two) times daily. 1 g 6  . montelukast (SINGULAIR) 10 MG tablet TAKE 1 TABLET BY MOUTH DAILY 90  tablet 1  . Multiple Vitamins-Minerals (MULTIVITAMIN WITH MINERALS) tablet Take 1 tablet by mouth daily.    Marland Kitchen MYRBETRIQ 25 MG TB24 tablet TAKE 1 TABLET BY MOUTH DAILY FOR BLADDER SYMPTOMS 30 tablet 6  . rOPINIRole (REQUIP) 0.5 MG tablet Start with 1/2 tablet at bedtime.  May increase to a whole tablet after 3 days if needed for restless legs 30 tablet 6  . traMADol (ULTRAM) 50 MG tablet 1 tab every 12 hrs as needed for pain. 60 tablet 0   No current facility-administered medications for this visit.     Past Medical History:  Diagnosis Date  . Arthritis   . Asthma   . Atrial fibrillation ([place])    a. s/p DCCV in 2015, on Amiodarone and Eliquis  . CHF (congestive heart failure) ([place])   . COPD (chronic obstructive pulmonary disease) ([place])   . Dysrhythmia    afib -hx   . Neuromuscular disorder ([place])    peripheral neuropathy   . Pneumonia    hx of x 2   . PONV (postoperative nausea and vomiting)   . PVC's (premature ventricular contractions)     Past Surgical History:  Procedure Laterality Date  . APPENDECTOMY    . CARDIOVERSION N/A 03/08/2013   Procedure: CARDIOVERSION;  Surgeon: Larey Dresser, MD;  Location: [place] [facility];  Service: Cardiovascular;  Laterality: N/A;  . CHOLECYSTECTOMY    . oophrectomy    . TEE WITHOUT CARDIOVERSION N/A 03/08/2013   Procedure: TRANSESOPHAGEAL ECHOCARDIOGRAM (TEE);  Surgeon: Larey Dresser, MD;  Location: [facility];  Service: Cardiovascular;  Laterality: N/A;  . TONSILLECTOMY    .  TRANSURETHRAL RESECTION OF BLADDER TUMOR WITH GYRUS (TURBT-GYRUS)  05/13/2016   urology wiht [facility]  . TRANSURETHRAL RESECTION OF BLADDER TUMOR WITH MITOMYCIN-C N/A 12/24/2017   Procedure: TRANSURETHRAL RESECTION OF BLADDER TUMOR;  Surgeon: Lucas Mallow, MD;  Location: [facility];  Service: Urology;  Laterality: N/A;    Social History   Socioeconomic History  . Marital status: Divorced    Spouse name: Not on file  . Number of children: Not on file  . Years of  education: Not on file  . Highest education level: Not on file  Occupational History  . Occupation: Retired    Fish farm manager: RETIRED  Tobacco Use  . Smoking status: Former Smoker    Packs/day: 1.00    Years: 20.00    Pack years: 20.00    Types: Cigarettes    Quit date: 04/13/1981    Years since quitting: 37.8  . Smokeless tobacco: Never Used  Substance and Sexual Activity  . Alcohol use: Not Currently    Comment: Occasional  . Drug use: No  . Sexual activity: Never  Other Topics Concern  . Not on file  Social History Narrative  . Not on file   Social Determinants of Health   Financial Resource Strain:   . Difficulty of Paying Living Expenses: Not on file  Food Insecurity:   . Worried About Charity fundraiser in the Last Year: Not on file  . Ran Out of Food in the Last Year: Not on file  Transportation Needs:   . Lack of Transportation (Medical): Not on file  . Lack of Transportation (Non-Medical): Not on file  Physical Activity:   . Days of Exercise per Week: Not on file  . Minutes of Exercise per Session: Not on file  Stress:   . Feeling of Stress : Not on file  Social Connections:   . Frequency of Communication with Friends and Family: Not on file  . Frequency of Social Gatherings with Friends and Family: Not on file  . Attends Religious Services: Not on file  . Active Member of Clubs or Organizations: Not on file  . Attends Archivist Meetings: Not on file  . Marital Status: Not on file  Intimate Partner Violence:   . Fear of Current or Ex-Partner: Not on file  . Emotionally Abused: Not on file  . Physically Abused: Not on file  . Sexually Abused: Not on file    Family History  Problem Relation Age of Onset  . Heart failure Mother   . Diabetes Father   . Heart disease Other        No family history    ROS: no fevers or chills, productive cough, hemoptysis, dysphasia, odynophagia, melena, hematochezia, dysuria, hematuria, rash, seizure activity,  orthopnea, PND, pedal edema, claudication. Remaining systems are negative.  Physical Exam: Well-developed well-nourished in no acute distress.  Skin is warm and dry.  HEENT is normal.  Neck is supple.  Chest is clear to auscultation with normal expansion.  Cardiovascular exam is regular rate and rhythm.  2/6 systolic murmur left sternal border. Abdominal exam nontender or distended. No masses palpated. Extremities show no edema. neuro grossly intact  ECG-sinus rhythm at a rate of 70, no ST changes.  Personally reviewed  A/P  1 paroxysmal atrial fibrillation-patient is in sinus rhythm today.  Continue Cardizem at present dose.  Continue apixaban.  She becomes extremely symptomatic when she is in atrial fibrillation and we would like to maintain sinus rhythm.  There was a question of amiodarone lung toxicity previously and this has been discontinued 12/20/18.  Plan is for full washout and then initiation of Tikosyn if atrial fibrillation recurs.  Follow-up atrial fibrillation clinic.  2 chronic interstitial lung disease-managed by pulmonary.  3 chronic diastolic congestive heart failure-volume status is doing well.  Continue present dose of diuretic.  4 hypertension-blood pressure controlled.  Continue present medications.  5 history of kidney cyst-patient has followed up with urology for this.  Kirk Ruths, MD

## 2019-02-16 ENCOUNTER — Ambulatory Visit: Payer: Medicare Other | Attending: Internal Medicine

## 2019-02-16 DIAGNOSIS — Z23 Encounter for immunization: Secondary | ICD-10-CM | POA: Insufficient documentation

## 2019-02-16 NOTE — Progress Notes (Signed)
   Covid-19 Vaccination Clinic  Name:  Crystal Kerr    MRN: [id number] DOB: [birth year]  02/16/2019  [name] was observed post Covid-19 immunization for 15 minutes without incidence. She was provided with Vaccine Information Sheet and instruction to access the V-Safe system.   [name] was instructed to call 911 with any severe reactions post vaccine: Marland Kitchen Difficulty breathing  . Swelling of your face and throat  . A fast heartbeat  . A bad rash all over your body  . Dizziness and weakness    Immunizations Administered    Name Date Dose VIS Date Route   Pfizer COVID-19 Vaccine 02/16/2019 10:46 AM 0.3 mL 12/23/2018 Intramuscular   Manufacturer: [place]   Lot: [id number]   [place]: [id number]

## 2019-02-18 ENCOUNTER — Ambulatory Visit: Payer: Medicare Other

## 2019-02-22 ENCOUNTER — Other Ambulatory Visit: Payer: Self-pay

## 2019-02-22 ENCOUNTER — Encounter: Payer: Self-pay | Admitting: Cardiology

## 2019-02-22 ENCOUNTER — Ambulatory Visit (INDEPENDENT_AMBULATORY_CARE_PROVIDER_SITE_OTHER): Payer: Medicare Other | Admitting: Cardiology

## 2019-02-22 VITALS — BP 118/58 | HR 70 | Ht 63.0 in | Wt 159.8 lb

## 2019-02-22 DIAGNOSIS — I5032 Chronic diastolic (congestive) heart failure: Secondary | ICD-10-CM

## 2019-02-22 DIAGNOSIS — I480 Paroxysmal atrial fibrillation: Secondary | ICD-10-CM

## 2019-02-22 DIAGNOSIS — I10 Essential (primary) hypertension: Secondary | ICD-10-CM | POA: Diagnosis not present

## 2019-02-22 NOTE — Patient Instructions (Signed)
Medication Instructions:  NO CHANGE *If you need a refill on your cardiac medications before your next appointment, please call your pharmacy*  Lab Work: If you have labs (blood work) drawn today and your tests are completely normal, you will receive your results only by: . MyChart Message (if you have MyChart) OR . A paper copy in the mail If you have any lab test that is abnormal or we need to change your treatment, we will call you to review the results.  Follow-Up: At [facility], you and your health needs are our priority.  As part of our continuing mission to provide you with exceptional heart care, we have created designated Provider Care Teams.  These Care Teams include your primary Cardiologist (physician) and Advanced Practice Providers (APPs -  Physician Assistants and Nurse Practitioners) who all work together to provide you with the care you need, when you need it.  Your next appointment:   6 month(s)  The format for your next appointment:   Either In Person or Virtual  Provider:   Brian Crenshaw, MD   

## 2019-02-23 DIAGNOSIS — C678 Malignant neoplasm of overlapping sites of bladder: Secondary | ICD-10-CM | POA: Diagnosis not present

## 2019-03-09 ENCOUNTER — Other Ambulatory Visit: Payer: Self-pay | Admitting: *Deleted

## 2019-03-09 MED ORDER — APIXABAN 5 MG PO TABS: 5.0000 mg | ORAL_TABLET | Freq: Two times a day (BID) | ORAL | 3 refills | Status: DC

## 2019-03-09 NOTE — Telephone Encounter (Signed)
Rx has been sent to the pharmacy electronically. ° °

## 2019-03-13 ENCOUNTER — Ambulatory Visit: Payer: Medicare Other | Attending: Internal Medicine

## 2019-03-13 DIAGNOSIS — Z23 Encounter for immunization: Secondary | ICD-10-CM | POA: Insufficient documentation

## 2019-03-13 NOTE — Progress Notes (Signed)
   Covid-19 Vaccination Clinic  Name:  Crystal Kerr    MRN: [id number] DOB: Dec 30, 1924  03/13/2019  [name] was observed post Covid-19 immunization for 15 minutes without incidence. She was provided with Vaccine Information Sheet and instruction to access the V-Safe system.   [name] was instructed to call 911 with any severe reactions post vaccine: Marland Kitchen Difficulty breathing  . Swelling of your face and throat  . A fast heartbeat  . A bad rash all over your body  . Dizziness and weakness    Immunizations Administered    Name Date Dose VIS Date Route   Pfizer COVID-19 Vaccine 03/13/2019  3:23 PM 0.3 mL 12/23/2018 Intramuscular   Manufacturer: [place]   Lot: [id number]   [place]: [id number]

## 2019-03-16 NOTE — Progress Notes (Signed)
[facility] [address], [place], [place] [address] [phone] [id number] [phone]  Date:  03/20/2019   Name:  Crystal Kerr   DOB:  1924-10-02   MRN:  [id number]  PCP:  [name], MD    Chief Complaint: Arm Pain (shoulder pain, left shoulder, no injury,)   History of Present Illness:  Crystal Kerr is a 86 y.o. very pleasant female patient who presents with the following:   Elderly patient here today to discuss LEFT shoulder pain-last seen by myself in July This has gone on for about one month- her daughter thinks this might be rotator cuff pain, reminds her of her own rotator cuff injuries in the past Any movement of the shoulder is painful She is using a sling for the last 2-3 days for comfort No fall or other inciting injury that she is aware of  She does use a rolling walker to ambulate at home She is in a WC today due to long walk to my exam room   She is using tylenol and occasional tramadol for this pain -it helps some  Crystal Kerr has history of atrial fibrillation, interstitial lung disease/asthma, renal insufficiency, history of bladder cancer, chronic diastolic CHF, hypertension Her cardiologist is Dr. Stann Ore saw him about 1 month ago He recommended that she continue Cardizem and Eliquis She is no longer taking amiodarone as there was some question of this causing lung toxicity- they may start Tikosyn in the future  Urologist is Dr. Link Snuffer, saw him in December and then in February for a superficial bladder tumor removal done in office  She also is a patient of pulmonology, saw nurse practitioner [name] in November They are planning to change away from [facility] due to insurance coverage -pulmonology is helping here  Her daughter Crystal Kerr is very involved in her care, and is here with her today  She has not needed to use lasix in a month or so for swelling- this has been under  control  Bone density scan-discussed with patient, she would like to think about this Tetanus vaccine BMP, CBC done in September She has completed the COVID-19 series  Patient Active Problem List   Diagnosis Date Noted  . Secondary hypercoagulable state ([place]) 12/20/2018  . Bladder cancer ([place]) 07/26/2018  . Renal insufficiency 02/10/2018  . Chronic diastolic heart failure ([place]) 11/23/2017  . ILD (interstitial lung disease) ([place]) 11/23/2017  . Current use of long term anticoagulation 07/31/2016  . Atypical pneumonia 06/02/2016  . History of atrial fibrillation 06/02/2016  . Lower extremity edema 12/19/2013  . 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 Benign paroxysmal positional vertigo 09/13/2013  . Upper airway cough syndrome 07/10/2013  . Allergic rhinitis 07/05/2013  . Persistent atrial fibrillation ([place]) 03/30/2013  . Shortness of breath 02/06/2013  . Acute on chronic diastolic congestive heart failure ([place]) 02/04/2013  . Arthritis of spine 07/01/2012  . Intrinsic asthma 07/01/2012  . Dyspnea 04/12/2012  . PVC's (premature ventricular contractions) 04/24/2011  . Murmur, cardiac 04/24/2011    Past Medical History:  Diagnosis Date  . Arthritis   . Asthma   . Atrial fibrillation ([place])    a. s/p DCCV in 2015, on Amiodarone and Eliquis  . CHF (congestive heart failure) ([place])   . COPD (chronic obstructive pulmonary disease) ([place])   . Dysrhythmia    afib -hx   . Neuromuscular  disorder ([place])    peripheral neuropathy   . Pneumonia    hx of x 2   . PONV (postoperative nausea and vomiting)   . PVC's (premature ventricular contractions)     Past Surgical History:  Procedure Laterality Date  . APPENDECTOMY    . CARDIOVERSION N/A 03/08/2013   Procedure: CARDIOVERSION;  Surgeon: Larey Dresser, MD;  Location: [facility];  Service: Cardiovascular;  Laterality: N/A;  . CHOLECYSTECTOMY    . oophrectomy    . TEE  WITHOUT CARDIOVERSION N/A 03/08/2013   Procedure: TRANSESOPHAGEAL ECHOCARDIOGRAM (TEE);  Surgeon: Larey Dresser, MD;  Location: [place];  Service: Cardiovascular;  Laterality: N/A;  . TONSILLECTOMY    . TRANSURETHRAL RESECTION OF BLADDER TUMOR WITH GYRUS (TURBT-GYRUS)  05/13/2016   urology wiht [facility]  . TRANSURETHRAL RESECTION OF BLADDER TUMOR WITH MITOMYCIN-C N/A 12/24/2017   Procedure: TRANSURETHRAL RESECTION OF BLADDER TUMOR;  Surgeon: Lucas Mallow, MD;  Location: [facility];  Service: Urology;  Laterality: N/A;    Social History   Tobacco Use  . Smoking status: Former Smoker    Packs/day: 1.00    Years: 20.00    Pack years: 20.00    Types: Cigarettes    Quit date: 04/13/1981    Years since quitting: 37.9  . Smokeless tobacco: Never Used  Substance Use Topics  . Alcohol use: Not Currently    Comment: Occasional  . Drug use: No    Family History  Problem Relation Age of Onset  . Heart failure Mother   . Diabetes Father   . Heart disease Other        No family history    No Known Allergies  Medication list has been reviewed and updated.  Current Outpatient Medications on File Prior to Visit  Medication Sig Dispense Refill  . acetaminophen (TYLENOL) 325 MG tablet Take 2 tablets (650 mg total) by mouth every 6 (six) hours as needed for mild pain or headache. 30 tablet 0  . albuterol (PROAIR HFA) 108 (90 Base) MCG/ACT inhaler Inhale 2 puffs into the lungs every 4 (four) hours as needed for wheezing or shortness of breath. 18 g 11  . apixaban (ELIQUIS) 5 MG TABS tablet Take 1 tablet (5 mg total) by mouth 2 (two) times daily. 180 tablet 3  . diltiazem (TIAZAC) 120 MG 24 hr capsule TAKE 1 CAPSULE BY MOUTH DAILY 90 capsule 3  . furosemide (LASIX) 20 MG tablet Take 1 tablet (20 mg total) by mouth daily. Use as needed for swelling (Patient taking differently: Take 20 mg by mouth daily as needed for fluid. Use as needed for swelling) 90 tablet 3  . mometasone-formoterol  (DULERA) 200-5 MCG/ACT AERO Inhale 2 puffs into the lungs 2 (two) times daily. 1 g 6  . montelukast (SINGULAIR) 10 MG tablet TAKE 1 TABLET BY MOUTH DAILY 90 tablet 1  . Multiple Vitamins-Minerals (MULTIVITAMIN WITH MINERALS) tablet Take 1 tablet by mouth daily.    Marland Kitchen MYRBETRIQ 25 MG TB24 tablet TAKE 1 TABLET BY MOUTH DAILY FOR BLADDER SYMPTOMS 30 tablet 6  . rOPINIRole (REQUIP) 0.5 MG tablet Start with 1/2 tablet at bedtime.  May increase to a whole tablet after 3 days if needed for restless legs 30 tablet 6  . traMADol (ULTRAM) 50 MG tablet 1 tab every 12 hrs as needed for pain. 60 tablet 0   No current facility-administered medications on file prior to visit.    Review of Systems:  As per HPI- otherwise negative.  Physical Examination: Vitals:   03/20/19 0844  BP: 122/82  Pulse: 68  Resp: 18  Temp: (!) 96.4 F (35.8 C)  SpO2: 96%   Vitals:   03/20/19 0844  Weight: 160 lb (72.6 kg)  Height: 5\' 3"  (1.6 m)   Body mass index is 28.34 kg/m. Ideal Body Weight: Weight in (lb) to have BMI = 25: 140.8  GEN: no acute distress.  Overweight, sitting in wheelchair today HEENT: Atraumatic, Normocephalic.  Ears and Nose: No external deformity. CV: RRR, No M/G/R. No JVD. No thrill. No extra heart sounds. PULM: CTA B, no wheezes, crackles, rhonchi. No retractions. No resp. distress. No accessory muscle use. ABD: S, NT, ND, +BS. No rebound. No HSM. EXTR: No c/c/e PSYCH: Normally interactive. Conversant.  Wearing sling on left arm.  The patient has severe tenderness with any movement of her left shoulder.  The elbow, wrist and hand are negative. Exam of left shoulder is limited by pain.  There is no redness or heat.  She notes pain with flexion or abduction of the shoulder, and can only move about 20 degrees in these directions.   Assessment and Plan: Pain in joint of left shoulder - Plan: DG Shoulder Left, DG Humerus Left, Ambulatory referral to Orthopedic Surgery  Here today with  significant left shoulder pain.  She also seems to have a frozen shoulder.  We will obtain plain films of her shoulder today, she has tramadol and Tylenol to use as needed.  Plan to get her in with orthopedics ASAP.  She has been seen by [facility] in the past, will call them on her behalf Sling may increase stiffness, but at this point I do not think will make her any worse in the short-term.  She may continue sling as needed for pain control Moderate medical decision making This visit occurred during the SARS-CoV-2 public health emergency.  Safety protocols were in place, including screening questions prior to the visit, additional usage of staff PPE, and extensive cleaning of exam room while observing appropriate contact time as indicated for disinfecting solutions.    Signed Lamar Blinks, MD  Appt tomorrow with Tammy at 11 am  [facility]- can get her seen tomorrow   Received her shoulder films-  DG Shoulder Left  Result Date: 03/20/2019 CLINICAL DATA:  No known injury.  Left shoulder and arm pain EXAM: LEFT SHOULDER - 2+ VIEW COMPARISON:  Humerus series today FINDINGS: No acute bony abnormality. Specifically, no fracture, subluxation, or dislocation. Joint space narrowing in the left glenohumeral and AC joints. IMPRESSION: Mild degenerative changes in the left shoulder. No acute bony abnormality. Electronically Signed   By: Rolm Baptise M.D.   On: 03/20/2019 09:33   DG Humerus Left  Result Date: 03/20/2019 CLINICAL DATA:  Left shoulder pain.  No known injury. EXAM: LEFT HUMERUS - 2+ VIEW COMPARISON:  Left shoulder series today FINDINGS: There is no evidence of fracture or other focal bone lesions. Soft tissues are unremarkable. IMPRESSION: Negative. Electronically Signed   By: Rolm Baptise M.D.   On: 03/20/2019 09:32

## 2019-03-16 NOTE — Patient Instructions (Addendum)
It was great to see you again today Please consider having a tetanus booster and shingles vaccine series given at your pharmacy; I am so glad you already had your Covid vaccine!  We will get some x-rays of your shoulder and upper arm today- I will then get you in to see orthopedics in the next couple of days

## 2019-03-17 ENCOUNTER — Other Ambulatory Visit: Payer: Self-pay

## 2019-03-20 ENCOUNTER — Ambulatory Visit (INDEPENDENT_AMBULATORY_CARE_PROVIDER_SITE_OTHER): Payer: Medicare Other | Admitting: Family Medicine

## 2019-03-20 ENCOUNTER — Other Ambulatory Visit: Payer: Self-pay

## 2019-03-20 ENCOUNTER — Encounter: Payer: Self-pay | Admitting: Family Medicine

## 2019-03-20 ENCOUNTER — Ambulatory Visit (HOSPITAL_BASED_OUTPATIENT_CLINIC_OR_DEPARTMENT_OTHER)
Admission: RE | Admit: 2019-03-20 | Discharge: 2019-03-20 | Disposition: A | Discharge status: Home / Self Care | Payer: Medicare Other | Source: Ambulatory Visit | Attending: Family Medicine | Admitting: Family Medicine

## 2019-03-20 VITALS — BP 122/82 | HR 68 | Temp 96.4°F | Resp 18 | Ht 63.0 in | Wt 160.0 lb

## 2019-03-20 DIAGNOSIS — M25512 Pain in left shoulder: Secondary | ICD-10-CM

## 2019-03-20 DIAGNOSIS — M19012 Primary osteoarthritis, left shoulder: Secondary | ICD-10-CM | POA: Diagnosis not present

## 2019-03-21 ENCOUNTER — Encounter: Payer: Self-pay | Admitting: Adult Health

## 2019-03-21 ENCOUNTER — Telehealth (INDEPENDENT_AMBULATORY_CARE_PROVIDER_SITE_OTHER): Payer: Medicare Other | Admitting: Adult Health

## 2019-03-21 DIAGNOSIS — J4530 Mild persistent asthma, uncomplicated: Secondary | ICD-10-CM | POA: Diagnosis not present

## 2019-03-21 DIAGNOSIS — I4819 Other persistent atrial fibrillation: Secondary | ICD-10-CM | POA: Diagnosis not present

## 2019-03-21 DIAGNOSIS — Z87891 Personal history of nicotine dependence: Secondary | ICD-10-CM

## 2019-03-21 DIAGNOSIS — J849 Interstitial pulmonary disease, unspecified: Secondary | ICD-10-CM

## 2019-03-21 DIAGNOSIS — I5032 Chronic diastolic (congestive) heart failure: Secondary | ICD-10-CM

## 2019-03-21 NOTE — Progress Notes (Signed)
Reviewed and agree with assessment/plan.   Ellena Kamen, MD [facility] 01/08/2016, 12:24 PM Pager:  [phone]  

## 2019-03-21 NOTE — Patient Instructions (Signed)
Continue on Dulera 214mcg 2 puffs Twice daily  , rinse after use.-We will contact pharmacy to find an alternative per your insurance coverage.  Mucinex DM Twice daily  As needed  Cough/congestion  Follow up in 3 months with Dr. [name] or Agusta Hackenberg NP and As needed   Please contact office for sooner follow up if symptoms do not improve or worsen or seek emergency care

## 2019-03-21 NOTE — Progress Notes (Signed)
Virtual Visit via Video Note  I connected with Crystal Kerr on 03/21/19 at 11:00 AM EST by a video enabled telemedicine application and verified that I am speaking with the correct person using two identifiers.  Location: Patient: Home  Provider: Home    I discussed the limitations of evaluation and management by telemedicine and the availability of in person appointments. The patient expressed understanding and agreed to proceed.  History of Present Illness: This is a 86 year old female former smoker, quit 1983, seen in May 2018 after a pneumonia complicated by rhinovirus.-CT chest showed groundglass opacities clustered nodules consistent with acute pneumonia with possible cavitation and larger nodule.  Medical history significant for A. fib, diastolic heart failure and bladder cancer diagnosed in 2018 She is a retired Water quality scientist televisit is a 20-month follow-up for interstitial lung disease, asthma and abnormal CT chest.  She is accompanied by her daughter Crystal Kerr who helps with her care.  Patient is independent and involved in her care.  Since last visit patient says she has been doing well.  Her breathing has been the best has been in a long time.  She has had no flare cough or wheezing.  Activity level remains at baseline.  No decreased activity tolerance or increased shortness of breath.  Patient is on Dulera twice daily.  They do request that we find an alternative as her insurance has sent a letter that they will no longer be covering [place].  Last CT chest November 21, 2018 showed patchy areas of groundglass attenuation and septal thickening favored to represent chronic hypersensitivity pneumonitis similar to her scans in August 2019.  She has received both of her Covid vaccines.  Patient does have known bladder cancer and is following with urology.  Patient Active Problem List   Diagnosis Date Noted  . Secondary hypercoagulable state ([place]) 12/20/2018  . Bladder cancer ([place])  07/26/2018  . Renal insufficiency 02/10/2018  . Chronic diastolic heart failure ([place]) 11/23/2017  . ILD (interstitial lung disease) ([place]) 11/23/2017  . Current use of long term anticoagulation 07/31/2016  . Atypical pneumonia 06/02/2016  . History of atrial fibrillation 06/02/2016  . Lower extremity edema 12/19/2013  . 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 Benign paroxysmal positional vertigo 09/13/2013  . Upper airway cough syndrome 07/10/2013  . Allergic rhinitis 07/05/2013  . Persistent atrial fibrillation ([place]) 03/30/2013  . Shortness of breath 02/06/2013  . Acute on chronic diastolic congestive heart failure ([place]) 02/04/2013  . Arthritis of spine 07/01/2012  . Intrinsic asthma 07/01/2012  . Dyspnea 04/12/2012  . PVC's (premature ventricular contractions) 04/24/2011  . Murmur, cardiac 04/24/2011   Current Outpatient Medications on File Prior to Visit  Medication Sig Dispense Refill  . acetaminophen (TYLENOL) 325 MG tablet Take 2 tablets (650 mg total) by mouth every 6 (six) hours as needed for mild pain or headache. 30 tablet 0  . albuterol (PROAIR HFA) 108 (90 Base) MCG/ACT inhaler Inhale 2 puffs into the lungs every 4 (four) hours as needed for wheezing or shortness of breath. 18 g 11  . apixaban (ELIQUIS) 5 MG TABS tablet Take 1 tablet (5 mg total) by mouth 2 (two) times daily. 180 tablet 3  . diltiazem (TIAZAC) 120 MG 24 hr capsule TAKE 1 CAPSULE BY MOUTH DAILY 90 capsule 3  . furosemide (LASIX) 20 MG tablet Take 1 tablet (20 mg total) by mouth daily. Use as needed for swelling (Patient taking differently: Take  20 mg by mouth daily as needed for fluid. Use as needed for swelling) 90 tablet 3  . mometasone-formoterol (DULERA) 200-5 MCG/ACT AERO Inhale 2 puffs into the lungs 2 (two) times daily. 1 g 6  . montelukast (SINGULAIR) 10 MG tablet TAKE 1 TABLET BY MOUTH DAILY 90 tablet 1  . Multiple Vitamins-Minerals  (MULTIVITAMIN WITH MINERALS) tablet Take 1 tablet by mouth daily.    Marland Kitchen MYRBETRIQ 25 MG TB24 tablet TAKE 1 TABLET BY MOUTH DAILY FOR BLADDER SYMPTOMS 30 tablet 6  . rOPINIRole (REQUIP) 0.5 MG tablet Start with 1/2 tablet at bedtime.  May increase to a whole tablet after 3 days if needed for restless legs 30 tablet 6  . traMADol (ULTRAM) 50 MG tablet 1 tab every 12 hrs as needed for pain. 60 tablet 0   No current facility-administered medications on file prior to visit.     Observations/Objective: Patient appears in no acute distress.  No notable wheezing noted.  Echo 2015 EF 65%, Gr 2 DD , Mod Dilated RA  PFTs 03/20/14 FEV1 1.28 (85%) ratio 75 s resp to saba and dlco 68 corrects to 103  CT chest Jun 02, 2016 showed groundglass opacities and clustered nodules compatible with an acute pneumonia. There is suggestion of cavitation with a larger nodules although this is difficult to distinguish from the bronchiectatic changes.  CT chest September 22, 2016 showed market improvement in the multifocal bilateralcentrallyairspace disease on previous study.  02/2017 -Tx for PNA andpossible amiodarone pulmonary toxicity. Elevated sed rate at 99. Amiodarone was discontinued  High-resolution CT chest February 2019>extensive patchy groundglass opacities throughout both lungs involving all lung lobes largely new since September 2018 with mild mediastinal lymphadenopathy  HRCT August 17, 2017>shows some residual groundglass attenuation and scattered areas of mild bronchiectasis. No honeycombing. Several regions of groundglass attenuation on prior study have resolved.  HRCT 11/2018-patchy areas of ground-glass attenuation and septal thickening with some thickening of the peribronchovascular interstitium and areas of mild cylindrical bronchiectasis.  Assessment and Plan: ILD-serial CT chest has showed some improvement from CT chest noted in 2018.  She continues to have some  groundglass attenuation and thickening which has been stable.  -She continues to be clinically stable.  She remains off of amiodarone.  We will continue to monitor.  Asthma currently well controlled on Dulera.  Will need to find an alternative as insurance is no longer covering [place]. Will contact pharmacy for possible alternatives on her formulary.  Plan  Patient Instructions  Continue on Dulera 22mcg 2 puffs Twice daily  , rinse after use.-We will contact pharmacy to find an alternative per your insurance coverage.  Mucinex DM Twice daily  As needed  Cough/congestion  Follow up in 3 months with Dr. [name] or Scotland Dost NP and As needed   Please contact office for sooner follow up if symptoms do not improve or worsen or seek emergency care        Follow Up Instructions:    I discussed the assessment and treatment plan with the patient. The patient was provided an opportunity to ask questions and all were answered. The patient agreed with the plan and demonstrated an understanding of the instructions.   The patient was advised to call back or seek an in-person evaluation if the symptoms worsen or if the condition fails to improve as anticipated.  I provided 26 minutes of non-face-to-face time during this encounter.   [name], NP

## 2019-03-22 DIAGNOSIS — M19012 Primary osteoarthritis, left shoulder: Secondary | ICD-10-CM | POA: Diagnosis not present

## 2019-03-22 DIAGNOSIS — M67912 Unspecified disorder of synovium and tendon, left shoulder: Secondary | ICD-10-CM | POA: Diagnosis not present

## 2019-04-08 ENCOUNTER — Encounter: Payer: Self-pay | Admitting: Family Medicine

## 2019-04-08 MED ORDER — MONTELUKAST SODIUM 10 MG PO TABS: 10.0000 mg | ORAL_TABLET | Freq: Every day | ORAL | 3 refills | Status: DC

## 2019-04-12 NOTE — Telephone Encounter (Signed)
Tammy please advise on inhaler for this patient:  Hello!  Patient's plan prefers Advair diskus/HFA and Breo- copays for these inhalers are $9.20.  Plan paid for [facility]- copay $9.20, but stated that it is non-formulary- so they will eventually not cover without a prior authorization.  Airduo is also non-formulary.  Thanks!  1:50 PM Beatriz Chancellor, CPhT  Patients daughter states that they got a letter from insurance stating that come January 1st they will no longer cover Dulera. Patient still has inhaler and 1 refill left.

## 2019-04-12 NOTE — Telephone Encounter (Signed)
Hello!  Patient's plan prefers Advair diskus/HFA and Breo- copays for these inhalers are $9.20.  Plan paid for [facility]- copay $9.20, but stated that it is non-formulary- so they will eventually not cover without a prior authorization.  Airduo is also non-formulary.  Thanks!  1:50 PM Beatriz Chancellor, CPhT

## 2019-04-12 NOTE — Telephone Encounter (Signed)
Hey team, Can you possibly check on this to see which inhaler is covered for this patient or of there is something that we need to do for the [facility]?  Message: Hi Tammy, have you been able to look into the [facility] issue?  Mom's insurance will only cover the refill she'll need to get next week.  Thank you

## 2019-04-17 NOTE — Telephone Encounter (Signed)
Would change to BREO 200 1 puff daily (make sure she understands once daily only ) , also needs to brush/rinse and gargle after use.

## 2019-04-20 MED ORDER — BREO ELLIPTA 200-25 MCG/INH IN AEPB: 1.0000 | INHALATION_SPRAY | Freq: Every day | RESPIRATORY_TRACT | 11 refills | Status: DC

## 2019-05-04 ENCOUNTER — Telehealth: Payer: Self-pay | Admitting: Family Medicine

## 2019-05-04 ENCOUNTER — Other Ambulatory Visit: Payer: Self-pay | Admitting: Cardiology

## 2019-05-04 NOTE — Progress Notes (Signed)
  Chronic Care Management   Outreach Note  05/04/2019 Name: Crystal Kerr MRN: [id number] DOB: July 18, 1924  Referred by: [name], MD Reason for referral : No chief complaint on file.   An unsuccessful telephone outreach was attempted today. The patient was referred to the pharmacist for assistance with care management and care coordination.   Follow Up Plan:   [name] [employer] Scheduler

## 2019-05-05 ENCOUNTER — Encounter: Payer: Self-pay | Admitting: Family Medicine

## 2019-05-05 ENCOUNTER — Telehealth: Payer: Self-pay | Admitting: Family Medicine

## 2019-05-05 NOTE — Chronic Care Management (AMB) (Signed)
  Chronic Care Management   Note  05/05/2019 Name: [name] MRN: [id number] DOB: [birth year]  Crystal Kerr is a 86 y.o. year old female who is a primary care patient of Copland, Gay Filler, MD. I reached out to Crystal Kerr by phone today in response to a referral sent by Ms. Laurie Panda Chastain's PCP, Copland, Gay Filler, MD.   [name] was given information about Chronic Care Management services today including:  1. CCM service includes personalized support from designated clinical staff supervised by her physician, including individualized plan of care and coordination with other care providers 2. 24/7 contact phone numbers for assistance for urgent and routine care needs. 3. Service will only be billed when office clinical staff spend 20 minutes or more in a month to coordinate care. 4. Only one practitioner may furnish and bill the service in a calendar month. 5. The patient may stop CCM services at any time (effective at the end of the month) by phone call to the office staff.   Patient agreed to services and verbal consent obtained.    This note is not being shared with the patient for the following reason: To respect privacy (The patient or proxy has requested that the information not be shared). Follow up plan:   [name] [employer] Scheduler

## 2019-06-05 ENCOUNTER — Other Ambulatory Visit: Payer: Self-pay

## 2019-06-05 ENCOUNTER — Ambulatory Visit: Payer: Medicare Other | Admitting: Pharmacist

## 2019-06-05 DIAGNOSIS — J45909 Unspecified asthma, uncomplicated: Secondary | ICD-10-CM

## 2019-06-05 DIAGNOSIS — I4819 Other persistent atrial fibrillation: Secondary | ICD-10-CM

## 2019-06-05 NOTE — Patient Instructions (Signed)
Visit Information  Goals Addressed            This Visit's Progress   . Chronic Care Mangement Pharmacy Care Plan       CARE PLAN ENTRY  Current Barriers:  . Chronic Disease Management support, education, and care coordination needs related to AFib, Asthma, Heart Failure, Overactive Bladder, Restless Leg Syndrome, Pain  Asthma . Pharmacist Clinical Goal(s) o Over the next 180 days, patient will work with PharmD and providers to reduce symptoms of asthma . Current regimen:  o Breo 200-71mcg/inh 2 puffs once daily, Montelukast 10mg  daily, albuterol inhaler as needed . Interventions: o Discussed possibility of using albuterol 5-20 minutes before exerting activity to help reduce symptoms of shortness of breath . Patient self care activities - Over the next 180 days, patient will: o Maintain asthma medication regimen  Afib  . Pharmacist Clinical Goal(s) o Over the next 180 days, patient will work with PharmD and providers to reduce symptoms or complication of Afib . Current regimen:  o Diltiazem 120mg  daily, Eliquis 5mg  twice daily . Interventions: o Discussed the indication of diltiazem in setting of Afib . Patient self care activities - Over the next 180 days, patient will: o Maintain Afib medication regimen.  Medication management . Pharmacist Clinical Goal(s): o Over the next 180 days, patient will work with PharmD and providers to maintain optimal medication adherence . Current pharmacy: [facility] . Interventions o Comprehensive medication review performed. o Continue current medication management strategy . Patient self care activities - Over the next 180 days, patient will: o Focus on medication adherence by filling medications appropriately  o Take medications as prescribed o Report any questions or concerns to PharmD and/or provider(s)  Initial goal documentation        [name] was given information about Chronic Care Management services today including:    1. CCM service includes personalized support from designated clinical staff supervised by her physician, including individualized plan of care and coordination with other care providers 2. 24/7 contact phone numbers for assistance for urgent and routine care needs. 3. Standard insurance, coinsurance, copays and deductibles apply for chronic care management only during months in which we provide at least 20 minutes of these services. Most insurances cover these services at 100%, however patients may be responsible for any copay, coinsurance and/or deductible if applicable. This service may help you avoid the need for more expensive face-to-face services. 4. Only one practitioner may furnish and bill the service in a calendar month. 5. The patient may stop CCM services at any time (effective at the end of the month) by phone call to the office staff.  Patient agreed to services and verbal consent obtained.   The patient verbalized understanding of instructions provided today and agreed to receive a mailed copy of patient instruction and/or educational materials. Telephone follow up appointment with pharmacy team member scheduled for: 12/05/2019  Crystal Kerr, PharmD Clinical Pharmacist [facility] [phone]    Asthma and Physical Activity Physical activity is an important part of a healthy lifestyle. If you have asthma, it is important to exercise because physical activity can help you to:  Control your asthma.  Maintain your weight or lose weight.  Increase your energy.  Decrease stress and anxiety.  Lower your risk of getting sick.  Improve your heart health. However, asthma symptoms can flare up when you are physically active or exercising. You can learn how to control your asthma and prevent symptoms  during exercise. This will help you to remain physically active. How can asthma affect my ability to be physically active? When you have asthma,  physical activity can cause you to have symptoms such as:  Wheezing. This may sound like whistling while breathing.  A feeling of tightness in the chest.  Sore throat.  Coughing.  Shortness of breath.  Tiredness (fatigue) with minimal activity.  Increased sputum production.  Chest pain. What actions can I take to prevent asthma problems during physical activity? Pulmonary rehabilitation Enroll in a pulmonary rehabilitation program. Benefits of this type of program include:  Education on lung diseases.  Classes that teach you how to exercise and be more active while decreasing your shortness of breath.  A group setting that allows you to talk with others who have asthma. Asthma action plan Follow the asthma action plan set by your health care provider. Your personal asthma plan may include:  Taking your medicines as told by your health care provider.  Avoiding your asthma triggers, except physical activity. Triggers may include cold air, dust, pollen, pet dander, and air pollution.  Tracking your asthma control.  Using a peak flow meter.  Being aware of worsening symptoms.  Knowing when to seek emergency care. Proper breathing During exercise, follow these tips for proper breathing:  Breathe in before starting the exercise and breathe out during the hardest part of the exercise.  Take slow breaths.  Pace yourself and do not try to go too fast.  While breathing out, purse your lips. Before beginning any exercise program or new activity, talk with your health care provider. Medicines If physical activity triggers your asthma, your health care provider may order the following medicines:  A rescue inhaler (short-acting beta2-agonist) for you to use shortly before physical activity or exercise. Its effects may reduce exercise-related symptoms for 2-3 hours.  A long-acting beta2-agonist that can offer up to 12 hours of relief if taken daily.  Leukotriene modifiers.  These pills are taken several hours before physical activity or exercise to help prevent asthma symptoms that are caused by exercise.  Long-term control medicines. These will be given if you have severe or frequent asthma symptoms during or after exercise. These symptoms may also mean that your asthma is not well controlled.  General information  Exercise indoors when the air is dry or during allergy season.  Try to breathe in warm, moist air by wearing a scarf over your nose and mouth or breathing only through your nose.  Spend a few minutes warming up before your workout.  Cool down after exercise. What should I do if my asthma symptoms get worse? Contact your health care provider if your asthma symptoms are getting worse. Your asthma is getting worse if:  You have symptoms more often.  Your symptoms are more severe.  Your symptoms get worse at night and make you lose sleep.  Your peak flow number is lower than your personal best or changes a lot from Crystal Kerr to Crystal Kerr.  Your asthma medicines do not work as well as they used to.  You use your rescue inhaler more often. If you use your rescue inhaler more than 2 days a week, your asthma is not well controlled.  You go to the emergency room or see your health care provider because of an asthma attack. Where to find support  Ask your health care provider about signing up for a pulmonary rehabilitation program.  Ask your health care provider about asthma support groups.  Visit your local community health department.  Check out local hospitals' community health programs. Where can I get more information?  Your health care provider.  American Lung Association: lung.org  National Heart, Lung, and Blood Institute: https://www.[place].biz/ Contact a health care provider if:  You have trouble walking and talking because you are out of breath. Get help right away if:  Your lips or fingernails are blue.  You are not able to breathe or catch  your breath. Summary  Physical activity is an important part of a healthy lifestyle. However, if you have asthma, your symptoms can flare up during exercise or physical activity.  You can prevent problems during physical activity by doing pulmonary rehabilitation, following an asthma action plan, doing proper breathing, and using medicines.  Talk with your health care provider before starting any exercise program or new activity. This information is not intended to replace advice given to you by your health care provider. Make sure you discuss any questions you have with your health care provider. Document Revised: 04/19/2018 Document Reviewed: 03/16/2017 Elsevier Patient Education  [place].

## 2019-06-05 NOTE — Chronic Care Management (AMB) (Signed)
Chronic Care Management Pharmacy  Name: Crystal Kerr  MRN: [id number] DOB: 30-Jan-1924  Chief Complaint/ HPI  Crystal Kerr,  86 y.o. , female presents for their Initial CCM visit with the clinical pharmacist via telephone due to COVID-19 Pandemic.  PCP : Crystal Mclean, Kerr  Their chronic conditions include: AFib, Asthma, Heart Failure, Overactive Bladder, Restless Leg Syndrome, Pain  Office Visits: 03/20/19: Visit w/ Crystal Kerr - L shoulder pain - Xrays ordered (unremarkable). Use tramadol and tylenol PRN. Plan to get patient into ortho asap.   Consult Visit: 04/08/19: Pulmonary patient messages: Crystal Kerr changed to Breo due to insurance coverage.   03/21/19: Pulmonary visit w/ Crystal Kerr - ILD shows improvement. Pt remains off of amiodarone. Asthma controlled on Dulera although alternative needed due to insurance coverage. Mucinex DM twice daily prn cough/congestions. RTC 3 months with Crystal Kerr or Crystal Kerr PRN.   02/22/19: Cardio visit w/ Crystal Kerr - Amiodarone stopped due to concern with lung dx. Plan to start tikosyn once amiodarone washed out (if Afib returns). NSR at visit. Continue cardizem and eliquis.   01/31/19: Afib clinic visit w/ Crystal Kerr - Pt is s/p several DCCVs. Most recent 09/30/18. Amiodarone stopped on 12/20/18 d/t concern for lung toxicity. Pt maintaining SR currently. Continue eliquis 5mg  BID and diltiazem 120mg  daily. CHA2DS2-VASc score = 5   12/22/18: Urology visit w/ Crystal Kerr   Medications: Outpatient Encounter Medications as of 06/05/2019  Medication Sig  . acetaminophen (TYLENOL) 325 MG tablet Take 2 tablets (650 mg total) by mouth every 6 (six) hours as needed for mild pain or headache.  . albuterol (PROAIR HFA) 108 (90 Base) MCG/ACT inhaler Inhale 2 puffs into the lungs every 4 (four) hours as needed for wheezing or shortness of breath.  Marland Kitchen apixaban (ELIQUIS) 5 MG TABS tablet Take 1 tablet (5 mg total) by mouth 2 (two) times  daily.  Marland Kitchen diltiazem (TIAZAC) 120 MG 24 hr capsule TAKE 1 CAPSULE BY MOUTH DAILY  . fluticasone furoate-vilanterol (BREO ELLIPTA) 200-25 MCG/INH AEPB Inhale 1 puff into the lungs daily. Brush teeth or rinse and gargle after use.  . furosemide (LASIX) 20 MG tablet Take 1 tablet (20 mg total) by mouth daily. Use as needed for swelling (Patient taking differently: Take 20 mg by mouth daily as needed for fluid. Use as needed for swelling)  . montelukast (SINGULAIR) 10 MG tablet Take 1 tablet (10 mg total) by mouth daily.  . Multiple Vitamins-Minerals (MULTIVITAMIN WITH MINERALS) tablet Take 1 tablet by mouth daily.  Marland Kitchen MYRBETRIQ 25 MG TB24 tablet TAKE 1 TABLET BY MOUTH DAILY FOR BLADDER SYMPTOMS  . rOPINIRole (REQUIP) 0.5 MG tablet Start with 1/2 tablet at bedtime.  May increase to a whole tablet after 3 days if needed for restless legs  . traMADol (ULTRAM) 50 MG tablet 1 tab every 12 hrs as needed for pain.   No facility-administered encounter medications on file as of 06/05/2019.   SDOH Screenings   Alcohol Screen:   . Last Alcohol Screening Score (AUDIT):   Depression (PHQ2-9):   . PHQ-2 Score:   Financial Resource Strain: Low Risk   . Difficulty of Paying Living Expenses: Not very hard  Food Insecurity:   . Worried About Charity fundraiser in the Last Year:   . [place] in the Last Year:   Housing:   . Last Housing Risk Score:   Physical Activity:   . Days of Exercise per Week:   .  Minutes of Exercise per Session:   Social Connections:   . Frequency of Communication with Friends and Family:   . Frequency of Social Gatherings with Friends and Family:   . Attends Religious Services:   . Active Member of Clubs or Organizations:   . Attends Archivist Meetings:   Marland Kitchen Marital Status:   Stress:   . Feeling of Stress :   Tobacco Use: Medium Risk  . Smoking Tobacco Use: Former Smoker  . Smokeless Tobacco Use: Never Used  Transportation Needs:   . Film/video editor  (Medical):   Marland Kitchen Lack of Transportation (Non-Medical):      Current Diagnosis/Assessment:  Goals Addressed            This Visit's Progress   . Chronic Care Mangement Pharmacy Care Plan       CARE PLAN ENTRY  Current Barriers:  . Chronic Disease Management support, education, and care coordination needs related to AFib, Asthma, Heart Failure, Overactive Bladder, Restless Leg Syndrome, Pain  Asthma . Pharmacist Clinical Goal(s) o Over the next 180 days, patient will work with PharmD and providers to reduce symptoms of asthma . Current regimen:  o Breo 200-79mcg/inh 2 puffs once daily, Montelukast 10mg  daily, albuterol inhaler as needed . Interventions: o Discussed possibility of using albuterol 5-20 minutes before exerting activity to help reduce symptoms of shortness of breath . Patient self care activities - Over the next 180 days, patient will: o Maintain asthma medication regimen  Afib  . Pharmacist Clinical Goal(s) o Over the next 180 days, patient will work with PharmD and providers to reduce symptoms or complication of Afib . Current regimen:  o Diltiazem 120mg  daily, Eliquis 5mg  twice daily . Interventions: o Discussed the indication of diltiazem in setting of Afib . Patient self care activities - Over the next 180 days, patient will: o Maintain Afib medication regimen.  Medication management . Pharmacist Clinical Goal(s): o Over the next 180 days, patient will work with PharmD and providers to maintain optimal medication adherence . Current pharmacy: [facility] . Interventions o Comprehensive medication review performed. o Continue current medication management strategy . Patient self care activities - Over the next 180 days, patient will: o Focus on medication adherence by filling medications appropriately  o Take medications as prescribed o Report any questions or concerns to PharmD and/or provider(s)  Initial goal documentation       Social Hx:   Originally from [place].  Sibling moved to [place], [place] the rest moved here.  She has 3 children.  Lives with her daughter [name]. [name] is primary care giver. She wishes her siblings would help more, but the pandemic has allowed her to work from home.  Patient manages her own medications with pill box that she fills out weekly.   AFIB   Patient is currently rate controlled.  Patient has failed these meds in past: amiodarone (D/C 12/20/18 d/t concern for lung dx) Patient is currently controlled on the following medications: Diltiazem 120mg  daily, Eliquis 5mg  twice daily  Followed by Cardio and Afib clinic. No symptoms of Afib. Usual symptoms (feeling tired or "not feeling right") Last experience of Afib was about a month ago. She rested and it resolved.   Plan -Continue current medications   Asthma / Tobacco   Last spirometry score: 03/19/2014 FEV1: 1.17 (78%) FVC: 1.58 (77%) FEV1/FVC: 74%  Eosinophil count:   Lab Results  Component Value Date/Time   EOSPCT 0 07/08/2018 02:19 PM  %  Eos (Absolute):  Lab Results  Component Value Date/Time   EOSABS 0.0 07/08/2018 02:19 PM   EOSABS 0.3 03/03/2017 10:15 AM    Tobacco Status:  Social History   Tobacco Use  Smoking Status Former Smoker  . Packs/day: 1.00  . Years: 20.00  . Pack years: 20.00  . Types: Cigarettes  . Quit date: 04/13/1981  . Years since quitting: 38.1  Smokeless Tobacco Never Used    Patient has failed these meds in past: Dulera (changed to Breo due to insurance coverage) Patient is currently controlled on the following medications: Breo 200-56mcg/inh 2 puffs once daily, Montelukast 10mg  daily, albuterol inhaler as needed Using maintenance inhaler regularly? Yes Frequency of rescue inhaler use:  infrequently (once every 2 months)  Patient feels [employer] works better than [employer] She gets SOB with exertion.   We discussed:  Possibility of used prophlyactic albuterol with exerting  activities   Plan -Continue current medications  -Consider using albuterol 5-20 minutes before exerting activities to reduce incidence of SOB  Heart Failure   Type: Diastolic  Last ejection fraction: 02/14/2018 >65% NYHA Class: I (no actitivty limitation) AHA HF Stage: B (Heart disease present - no symptoms present)  Patient has failed these meds in past: None noted  Patient is currently controlled on the following medications: Furosemide 20mg  daily prn swelling  Furosemide: Once every 3 months   No edema present. When she has swelling she elevates her legs rather than taking lasix.   Plan -Continue current medications  Overactive Bladder    Patient has failed these meds in past: None noted  Patient is currently controlled on the following medications: Myrbetriq 25mg  daily   Only uses mybetriq once or twice per week as needed. Pt likes to use the least amount of medication as possible.  She wonders if this medication can cause constipation. We discussed that there is a possibility of it causing constipation (1-3%)  We discussed:  How the most efficacy is seen with daily use vs as needed use with this medication. Noting to balance risk/benefit with bladder control vs constipation  Plan -Consider using mybetriq daily for maximum efficiacy -Continue current medications   Restless Leg Syndrome    Patient has failed these meds in past: tizanidine, methocarbamol? Patient is currently controlled on the following medications: Ropinirole 0.5mg  1/2 -1 tab daily at bedtime  Uses about once a month. Pt likes to use the least amount of medication as possible.  She uses 1 whole tab for 3 days then she doesn't need it for another month She wonders if this medication can cause constipation. We discussed that there is a possibility of it causing constipation (5% with extended release formulation; pt takes immediate release, but there is still a possibility)  Plan -Continue current  medications   Pain (Shoulder pain)    Patient has failed these meds in past: None noted  Patient is currently controlled on the following medications: Tramadol 50mg  every 12 hours as needed, acetaminophen 325mg  #2 tabs as needed, voltaren OTC (was ok'd per cardio and PCP)  Lots of pain in her L shoulder  Acetaminophen: Takes at least once a day sometimes 2 times per day  Tramadol:Once or twice per week  "Sometimes nothing helps" (couple of times per week for a couple of hours)  Plan -Continue current medications   Miscellaneous Meds  Multivitamin

## 2019-06-15 ENCOUNTER — Other Ambulatory Visit: Payer: Self-pay

## 2019-06-15 DIAGNOSIS — I4819 Other persistent atrial fibrillation: Secondary | ICD-10-CM

## 2019-06-15 DIAGNOSIS — Z7901 Long term (current) use of anticoagulants: Secondary | ICD-10-CM

## 2019-06-15 DIAGNOSIS — I5032 Chronic diastolic (congestive) heart failure: Secondary | ICD-10-CM

## 2019-06-27 ENCOUNTER — Ambulatory Visit (HOSPITAL_COMMUNITY)
Admission: RE | Admit: 2019-06-27 | Discharge: 2019-06-27 | Disposition: A | Discharge status: Home / Self Care | Payer: Medicare Other | Source: Ambulatory Visit | Attending: Physician Assistant | Admitting: Physician Assistant

## 2019-06-27 ENCOUNTER — Encounter (HOSPITAL_COMMUNITY): Payer: Self-pay | Admitting: Physician Assistant

## 2019-06-27 ENCOUNTER — Telehealth: Payer: Self-pay | Admitting: Pharmacist

## 2019-06-27 ENCOUNTER — Other Ambulatory Visit: Payer: Self-pay

## 2019-06-27 VITALS — BP 132/70 | HR 83 | Ht 63.0 in | Wt 162.2 lb

## 2019-06-27 DIAGNOSIS — J449 Chronic obstructive pulmonary disease, unspecified: Secondary | ICD-10-CM | POA: Diagnosis not present

## 2019-06-27 DIAGNOSIS — Z8249 Family history of ischemic heart disease and other diseases of the circulatory system: Secondary | ICD-10-CM | POA: Diagnosis not present

## 2019-06-27 DIAGNOSIS — I4819 Other persistent atrial fibrillation: Secondary | ICD-10-CM | POA: Diagnosis not present

## 2019-06-27 DIAGNOSIS — Z87891 Personal history of nicotine dependence: Secondary | ICD-10-CM | POA: Diagnosis not present

## 2019-06-27 DIAGNOSIS — I493 Ventricular premature depolarization: Secondary | ICD-10-CM | POA: Insufficient documentation

## 2019-06-27 DIAGNOSIS — Z79899 Other long term (current) drug therapy: Secondary | ICD-10-CM | POA: Diagnosis not present

## 2019-06-27 DIAGNOSIS — I110 Hypertensive heart disease with heart failure: Secondary | ICD-10-CM | POA: Insufficient documentation

## 2019-06-27 DIAGNOSIS — M1990 Unspecified osteoarthritis, unspecified site: Secondary | ICD-10-CM | POA: Insufficient documentation

## 2019-06-27 DIAGNOSIS — J849 Interstitial pulmonary disease, unspecified: Secondary | ICD-10-CM | POA: Diagnosis not present

## 2019-06-27 DIAGNOSIS — I5032 Chronic diastolic (congestive) heart failure: Secondary | ICD-10-CM | POA: Diagnosis not present

## 2019-06-27 DIAGNOSIS — D6869 Other thrombophilia: Secondary | ICD-10-CM

## 2019-06-27 DIAGNOSIS — G629 Polyneuropathy, unspecified: Secondary | ICD-10-CM | POA: Insufficient documentation

## 2019-06-27 DIAGNOSIS — I2510 Atherosclerotic heart disease of native coronary artery without angina pectoris: Secondary | ICD-10-CM | POA: Insufficient documentation

## 2019-06-27 DIAGNOSIS — Z7901 Long term (current) use of anticoagulants: Secondary | ICD-10-CM | POA: Insufficient documentation

## 2019-06-27 LAB — BASIC METABOLIC PANEL
Anion gap: 8 (ref 5–15)
BUN: 29 mg/dL — ABNORMAL HIGH (ref 8–23)
CO2: 23 mmol/L (ref 22–32)
Calcium: 8.7 mg/dL — ABNORMAL LOW (ref 8.9–10.3)
Chloride: 107 mmol/L (ref 98–111)
Creatinine, Ser: 1.21 mg/dL — ABNORMAL HIGH (ref 0.44–1.00)
GFR calc Af Amer: 45 mL/min — ABNORMAL LOW (ref >60)
GFR calc non Af Amer: 39 mL/min — ABNORMAL LOW (ref >60)
Glucose, Bld: 125 mg/dL — ABNORMAL HIGH (ref 70–99)
Potassium: 3.8 mmol/L (ref 3.5–5.1)
Sodium: 138 mmol/L (ref 135–145)

## 2019-06-27 LAB — MAGNESIUM: Magnesium: 1.8 mg/dL (ref 1.7–2.4)

## 2019-06-27 NOTE — Progress Notes (Signed)
Primary Care Physician: [name], MD Primary Cardiologist: Dr [name] Primary Electrophysiologist: none Referring Physician: Dr Remo Lipps Crystal Kerr is a 86 y.o. 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  The patient was initially diagnosed with atrial fibrillation in 2015 in the setting of viral gastroenteritis and had TEE/DCCV at that time. She is on Eliquis for a CHADS2VASC score of 5. Patient had been maintained on amiodarone and underwent cardioversions in January, June, and September of 2020. She is followed by pulmonology for her ILD. She denies any significant snoring or alcohol use.  On follow up today, patient reports that she was in her usual state of health until the week of 06/19/19 when she started having symptoms of fatigue and SOB. She also noted increased lower extremity edema. No orthopnea or PND. ECG today shows she is in rate controlled afib. Patient has checked on the price of dofetilide and is agreeable for admission.   Today, she denies symptoms of palpitations, chest pain, orthopnea, PND, dizziness, presyncope, syncope, snoring, daytime somnolence, bleeding, or neurologic sequela. The patient is tolerating medications without difficulties and is otherwise without complaint today.    Atrial Fibrillation Risk Factors:  she does not have symptoms or diagnosis of sleep apnea. she does not have a history of rheumatic fever. she does not have a history of alcohol use. The patient does not have a history of early familial atrial fibrillation or other arrhythmias.  she has a BMI of Body mass index is 28.73 kg/m.Marland Kitchen Filed Weights   06/27/19 1431  Weight: 73.6 kg    Family History  Problem Relation Age of Onset  . Heart failure Mother   . Diabetes Father   . Heart disease Other         No family history     Atrial Fibrillation Management history:  Previous antiarrhythmic drugs: amiodarone Previous cardioversions: 2015, 01/2018, 06/2018, 09/2018 Previous ablations: none CHADS2VASC score: 5 Anticoagulation history: Eliquis   Past Medical History:  Diagnosis Date  . Arthritis   . Asthma   . Atrial fibrillation ([place])    a. s/p DCCV in 2015, on Amiodarone and Eliquis  . CHF (congestive heart failure) ([place])   . COPD (chronic obstructive pulmonary disease) ([place])   . Dysrhythmia    afib -hx   . Neuromuscular disorder ([place])    peripheral neuropathy   . Pneumonia    hx of x 2   . PONV (postoperative nausea and vomiting)   . PVC's (premature ventricular contractions)    Past Surgical History:  Procedure Laterality Date  . APPENDECTOMY    . CARDIOVERSION N/A 03/08/2013   Procedure: CARDIOVERSION;  Surgeon: Larey Dresser, MD;  Location: [facility];  Service: Cardiovascular;  Laterality: N/A;  . CHOLECYSTECTOMY    . oophrectomy    . TEE WITHOUT CARDIOVERSION N/A 03/08/2013   Procedure: TRANSESOPHAGEAL ECHOCARDIOGRAM (TEE);  Surgeon: Larey Dresser, MD;  Location: [facility];  Service: Cardiovascular;  Laterality: N/A;  . TONSILLECTOMY    . TRANSURETHRAL RESECTION OF BLADDER TUMOR WITH GYRUS (TURBT-GYRUS)  05/13/2016   urology wiht [facility]  . TRANSURETHRAL RESECTION OF BLADDER TUMOR WITH MITOMYCIN-C N/A 12/24/2017   Procedure: TRANSURETHRAL RESECTION OF BLADDER TUMOR;  Surgeon: Lucas Mallow, MD;  Location: [facility];  Service: Urology;  Laterality: N/A;    Current Outpatient Medications  Medication Sig Dispense Refill  . acetaminophen (TYLENOL) 325 MG tablet Take 2 tablets (650 mg total) by mouth every 6 (six) hours as needed for mild pain or headache. 30 tablet 0  . albuterol (PROAIR HFA) 108 (90 Base) MCG/ACT inhaler Inhale 2 puffs into the lungs every 4 (four) hours as needed for wheezing or shortness of breath. 18 g 11  . apixaban (ELIQUIS) 5 MG TABS tablet  Take 1 tablet (5 mg total) by mouth 2 (two) times daily. 180 tablet 3  . Diclofenac Sodium (VOLTAREN EX) Apply topically.    Marland Kitchen diltiazem (TIAZAC) 120 MG 24 hr capsule TAKE 1 CAPSULE BY MOUTH DAILY 90 capsule 3  . fluticasone furoate-vilanterol (BREO ELLIPTA) 200-25 MCG/INH AEPB Inhale 1 puff into the lungs daily. Brush teeth or rinse and gargle after use. 60 each 11  . furosemide (LASIX) 20 MG tablet Take 1 tablet (20 mg total) by mouth daily. Use as needed for swelling 90 tablet 3  . montelukast (SINGULAIR) 10 MG tablet Take 1 tablet (10 mg total) by mouth daily. 90 tablet 3  . Multiple Vitamins-Minerals (MULTIVITAMIN WITH MINERALS) tablet Take 1 tablet by mouth daily.    Marland Kitchen MYRBETRIQ 25 MG TB24 tablet TAKE 1 TABLET BY MOUTH DAILY FOR BLADDER SYMPTOMS 30 tablet 6  . rOPINIRole (REQUIP) 0.5 MG tablet Start with 1/2 tablet at bedtime.  May increase to a whole tablet after 3 days if needed for restless legs 30 tablet 6  . traMADol (ULTRAM) 50 MG tablet 1 tab every 12 hrs as needed for pain. 60 tablet 0   No current facility-administered medications for this encounter.    No Known Allergies  Social History   Socioeconomic History  . Marital status: Divorced    Spouse name: Not on file  . Number of children: Not on file  . Years of education: Not on file  . Highest education level: Not on file  Occupational History  . Occupation: Retired    Fish farm manager: RETIRED  Tobacco Use  . Smoking status: Former Smoker    Packs/day: 1.00    Years: 20.00    Pack years: 20.00    Types: Cigarettes    Quit date: 04/13/1981    Years since quitting: 38.2  . Smokeless tobacco: Never Used  Vaping Use  . Vaping Use: Never used  Substance and Sexual Activity  . Alcohol use: Not Currently    Comment: Occasional  . Drug use: No  . Sexual activity: Never  Other Topics Concern  . Not on file  Social History Narrative  . Not on file   Social Determinants of Health   Financial Resource Strain: Low Risk     . Difficulty of Paying Living Expenses: Not very hard  Food Insecurity:   . Worried About Charity fundraiser in the Last Year:   . Arboriculturist in the Last Year:   Transportation Needs:   . Film/video editor (Medical):   Marland Kitchen Lack of Transportation (Non-Medical):   Physical Activity:   . Days of Exercise per Week:   . Minutes of Exercise per Session:   Stress:   . Feeling of Stress :   Social Connections:   . Frequency of Communication with Friends and Family:   . Frequency of Social Gatherings with Friends and Family:   . Attends Religious Services:   . Active Member of Clubs or Organizations:   . Attends Archivist Meetings:   Marland Kitchen Marital Status:  Intimate Partner Violence:   . Fear of Current or Ex-Partner:   . Emotionally Abused:   Marland Kitchen Physically Abused:   . Sexually Abused:      ROS- All systems are reviewed and negative except as per the HPI above.  Physical Exam: Vitals:   06/27/19 1431  BP: 132/70  Pulse: 83  Weight: 73.6 kg  Height: 5\' 3"  (1.6 m)    GEN- The patient is well appearing elderly female, alert and oriented x 3 today.   HEENT-head normocephalic, atraumatic, sclera clear, conjunctiva pink, hearing intact, trachea midline. Lungs- Clear to ausculation bilaterally, normal work of breathing Heart- irregular rate and rhythm, no murmurs, rubs or gallops  GI- soft, NT, ND, + BS Extremities- no clubbing, cyanosis. 1+ bilateral edema MS- no significant deformity or atrophy Skin- no rash or lesion Psych- euthymic mood, full affect Neuro- strength and sensation are intact   Wt Readings from Last 3 Encounters:  06/27/19 73.6 kg  03/20/19 72.6 kg  02/22/19 72.5 kg    EKG today demonstrates afib HR 83, QRS 86, QTc 451  Echo 2//20 demonstrated   1. The left ventricle has hyperdynamic systolic function of >01%. The cavity size is normal. There is no left ventricular wall thickness. Echo evidence of impaired relaxation diastolic filling  patterns.  2. Mildly dilated left atrial size.  3. Normal right atrial size.  4. Trivial pericardial effusion, as described above.  5. Normal tricuspid valve.  6. The aortic valve tricuspid. There is moderate thickening and moderate sclerosis of the aortic valve.  7. No atrial level shunt detected by color flow Doppler.  Epic records are reviewed at length today  Assessment and Plan:  1. Persistent atrial fibrillation Amiodarone discontinued 2/2 concern for lung toxicity.  Patient back in rate controlled afib, appears persistent.  Patient ready to pursue dofetilide. Amio discontinued >3 months ago. Patient has checked on the price of the medication and reports it is affordable.  PharmD to screen drugs for QT prolonging agents.  QTc in SR 429-442 ms. Check bmet/mag today. Continue Eliquis 5 mg BID Continue diltiazem 120 mg daily  This patients CHA2DS2-VASc Score and unadjusted Ischemic Stroke Rate (% per year) is equal to 7.2 % stroke rate/year from a score of 5  Above score calculated as 1 point each if present [CHF, HTN, DM, Vascular=MI/PAD/Aortic Plaque, Age if 65-74, or Female] Above score calculated as 2 points each if present [Age > 75, or Stroke/TIA/TE]  2. HTN Stable, no changes today.  3. CAD Three vessel coronary calcification seen on CT. No anginal symptoms.  4. Chronic diastolic CHF Weight up ~2 lbs and she has noted some increased lower extremity edema.  Increase her Lasix to 20 mg daily. She will start K+ supplementation with Lasix. bmet as above.    Follow up next week for dofetilide admission.    [facility] [address] [place], [place] [address] [phone] 06/27/2019 3:08 PM

## 2019-06-27 NOTE — Telephone Encounter (Signed)
Medication list reviewed in anticipation of upcoming Tikosyn initiation. Patient is not taking any contraindicated medications. She does use Breo inhaler which can be QTc prolonging, however this is not contraindicated. Amiodarone discontinued > 3 months ago.  Patient is anticoagulated on Eliquis 5mg  BID on the appropriate dose. Please ensure that patient has not missed any anticoagulation doses in the 3 weeks prior to Tikosyn initiation.   Patient will need to be counseled to avoid use of Benadryl while on Tikosyn and in the 2-3 days prior to Tikosyn initiation.

## 2019-06-28 ENCOUNTER — Other Ambulatory Visit (HOSPITAL_COMMUNITY): Payer: Self-pay | Admitting: *Deleted

## 2019-06-28 ENCOUNTER — Ambulatory Visit: Payer: Self-pay | Admitting: Pharmacist

## 2019-06-28 DIAGNOSIS — R600 Localized edema: Secondary | ICD-10-CM

## 2019-06-28 MED ORDER — FUROSEMIDE 20 MG PO TABS: 20.0000 mg | ORAL_TABLET | Freq: Every day | ORAL | 3 refills | Status: AC

## 2019-06-28 MED ORDER — MAGNESIUM OXIDE 400 MG PO TABS: 400.0000 mg | ORAL_TABLET | Freq: Every day | ORAL | 3 refills | Status: DC

## 2019-06-28 NOTE — Addendum Note (Signed)
Addended by: Lamar Blinks C on: 06/28/2019 06:30 PM   Modules accepted: Orders

## 2019-06-28 NOTE — Chronic Care Management (AMB) (Signed)
Patient's daughter called me and states that the patient is having significant edema and needs a refill of furosemide. Will coordinate with primary care team to get this medication sent in to the patient's pharmacy.

## 2019-06-30 ENCOUNTER — Other Ambulatory Visit (HOSPITAL_COMMUNITY)
Admission: RE | Admit: 2019-06-30 | Discharge: 2019-06-30 | Disposition: A | Discharge status: Home / Self Care | Payer: Medicare Other | Source: Ambulatory Visit | Attending: Physician Assistant | Admitting: Physician Assistant

## 2019-06-30 DIAGNOSIS — Z20822 Contact with and (suspected) exposure to covid-19: Secondary | ICD-10-CM | POA: Insufficient documentation

## 2019-06-30 DIAGNOSIS — Z01812 Encounter for preprocedural laboratory examination: Secondary | ICD-10-CM | POA: Insufficient documentation

## 2019-07-01 LAB — SARS CORONAVIRUS 2 (TAT 6-24 HRS): SARS Coronavirus 2: NEGATIVE

## 2019-07-03 ENCOUNTER — Ambulatory Visit (HOSPITAL_COMMUNITY)
Admission: RE | Admit: 2019-07-03 | Discharge: 2019-07-03 | Disposition: A | Discharge status: Home / Self Care | Payer: Medicare Other | Source: Ambulatory Visit | Attending: Physician Assistant | Admitting: Physician Assistant

## 2019-07-03 ENCOUNTER — Inpatient Hospital Stay (HOSPITAL_COMMUNITY)
Admission: RE | Admit: 2019-07-03 | Discharge: 2019-07-06 | DRG: 309 | Disposition: A | Discharge status: Home / Self Care | Payer: Medicare Other | Attending: Cardiology | Admitting: Cardiology

## 2019-07-03 ENCOUNTER — Encounter (HOSPITAL_COMMUNITY): Payer: Self-pay | Admitting: Physician Assistant

## 2019-07-03 ENCOUNTER — Other Ambulatory Visit: Payer: Self-pay

## 2019-07-03 VITALS — BP 142/82 | HR 100 | Ht 63.0 in | Wt 159.4 lb

## 2019-07-03 DIAGNOSIS — I4819 Other persistent atrial fibrillation: Principal | ICD-10-CM | POA: Diagnosis present

## 2019-07-03 DIAGNOSIS — I2510 Atherosclerotic heart disease of native coronary artery without angina pectoris: Secondary | ICD-10-CM | POA: Diagnosis not present

## 2019-07-03 DIAGNOSIS — I5032 Chronic diastolic (congestive) heart failure: Secondary | ICD-10-CM | POA: Diagnosis not present

## 2019-07-03 DIAGNOSIS — M1990 Unspecified osteoarthritis, unspecified site: Secondary | ICD-10-CM | POA: Diagnosis present

## 2019-07-03 DIAGNOSIS — G629 Polyneuropathy, unspecified: Secondary | ICD-10-CM | POA: Diagnosis present

## 2019-07-03 DIAGNOSIS — Z20822 Contact with and (suspected) exposure to covid-19: Secondary | ICD-10-CM | POA: Diagnosis not present

## 2019-07-03 DIAGNOSIS — Z87891 Personal history of nicotine dependence: Secondary | ICD-10-CM

## 2019-07-03 DIAGNOSIS — Z7901 Long term (current) use of anticoagulants: Secondary | ICD-10-CM | POA: Diagnosis not present

## 2019-07-03 DIAGNOSIS — I358 Other nonrheumatic aortic valve disorders: Secondary | ICD-10-CM | POA: Diagnosis not present

## 2019-07-03 DIAGNOSIS — I493 Ventricular premature depolarization: Secondary | ICD-10-CM | POA: Diagnosis present

## 2019-07-03 DIAGNOSIS — D6869 Other thrombophilia: Secondary | ICD-10-CM

## 2019-07-03 DIAGNOSIS — J449 Chronic obstructive pulmonary disease, unspecified: Secondary | ICD-10-CM | POA: Diagnosis not present

## 2019-07-03 DIAGNOSIS — I110 Hypertensive heart disease with heart failure: Secondary | ICD-10-CM | POA: Diagnosis not present

## 2019-07-03 LAB — BASIC METABOLIC PANEL
Anion gap: 13 (ref 5–15)
BUN: 32 mg/dL — ABNORMAL HIGH (ref 8–23)
CO2: 22 mmol/L (ref 22–32)
Calcium: 9.2 mg/dL (ref 8.9–10.3)
Chloride: 100 mmol/L (ref 98–111)
Creatinine, Ser: 1.09 mg/dL — ABNORMAL HIGH (ref 0.44–1.00)
GFR calc Af Amer: 51 mL/min — ABNORMAL LOW (ref >60)
GFR calc non Af Amer: 44 mL/min — ABNORMAL LOW (ref >60)
Glucose, Bld: 92 mg/dL (ref 70–99)
Potassium: 4.3 mmol/L (ref 3.5–5.1)
Sodium: 135 mmol/L (ref 135–145)

## 2019-07-03 LAB — MAGNESIUM: Magnesium: 2.1 mg/dL (ref 1.7–2.4)

## 2019-07-03 MED ORDER — SODIUM CHLORIDE 0.9% FLUSH: 3.0000 mL | INTRAVENOUS | Status: DC | PRN

## 2019-07-03 MED ORDER — ALBUTEROL SULFATE (2.5 MG/3ML) 0.083% IN NEBU: 2.5000 mg | INHALATION_SOLUTION | RESPIRATORY_TRACT | Status: DC | PRN

## 2019-07-03 MED ORDER — FUROSEMIDE 20 MG PO TABS
20.0000 mg | ORAL_TABLET | Freq: Every day | ORAL | Status: DC
  Administered 2019-07-03 – 2019-07-05 (×3): 20 mg via ORAL
  Filled 2019-07-03 (×3): qty 1

## 2019-07-03 MED ORDER — SODIUM CHLORIDE 0.9% FLUSH
3.0000 mL | Freq: Two times a day (BID) | INTRAVENOUS | Status: DC
  Administered 2019-07-03 – 2019-07-05 (×3): 3 mL via INTRAVENOUS

## 2019-07-03 MED ORDER — SODIUM CHLORIDE 0.9 % IV SOLN: 250.0000 mL | INTRAVENOUS | Status: DC | PRN

## 2019-07-03 MED ORDER — APIXABAN 5 MG PO TABS
5.0000 mg | ORAL_TABLET | Freq: Two times a day (BID) | ORAL | Status: DC
  Administered 2019-07-03 – 2019-07-06 (×6): 5 mg via ORAL
  Filled 2019-07-03 (×6): qty 1

## 2019-07-03 MED ORDER — DOFETILIDE 125 MCG PO CAPS
125.0000 ug | ORAL_CAPSULE | Freq: Two times a day (BID) | ORAL | Status: DC
  Administered 2019-07-03 – 2019-07-06 (×6): 125 ug via ORAL
  Filled 2019-07-03 (×8): qty 1

## 2019-07-03 MED ORDER — DILTIAZEM HCL ER BEADS 120 MG PO CP24: 120.0000 mg | ORAL_CAPSULE | Freq: Every day | ORAL | Status: DC

## 2019-07-03 MED ORDER — MONTELUKAST SODIUM 10 MG PO TABS
10.0000 mg | ORAL_TABLET | Freq: Every day | ORAL | Status: DC
  Administered 2019-07-03 – 2019-07-06 (×3): 10 mg via ORAL
  Filled 2019-07-03 (×4): qty 1

## 2019-07-03 MED ORDER — ACETAMINOPHEN 325 MG PO TABS: 650.0000 mg | ORAL_TABLET | Freq: Four times a day (QID) | ORAL | Status: DC | PRN

## 2019-07-03 MED ORDER — MIRABEGRON ER 25 MG PO TB24
25.0000 mg | ORAL_TABLET | Freq: Every day | ORAL | Status: DC
  Administered 2019-07-03 – 2019-07-06 (×2): 25 mg via ORAL
  Filled 2019-07-03 (×4): qty 1

## 2019-07-03 MED ORDER — DILTIAZEM HCL ER COATED BEADS 120 MG PO CP24
120.0000 mg | ORAL_CAPSULE | Freq: Every day | ORAL | Status: DC
  Administered 2019-07-04 – 2019-07-06 (×3): 120 mg via ORAL
  Filled 2019-07-03 (×3): qty 1

## 2019-07-03 MED ORDER — FLUTICASONE FUROATE-VILANTEROL 200-25 MCG/INH IN AEPB
1.0000 | INHALATION_SPRAY | Freq: Every day | RESPIRATORY_TRACT | Status: DC
  Filled 2019-07-03: qty 28

## 2019-07-03 MED ORDER — TRAMADOL HCL 50 MG PO TABS: 50.0000 mg | ORAL_TABLET | Freq: Two times a day (BID) | ORAL | Status: DC | PRN

## 2019-07-03 MED ORDER — MAGNESIUM OXIDE 400 (241.3 MG) MG PO TABS
400.0000 mg | ORAL_TABLET | Freq: Every day | ORAL | Status: DC
  Administered 2019-07-03 – 2019-07-06 (×4): 400 mg via ORAL
  Filled 2019-07-03 (×4): qty 1

## 2019-07-03 MED ORDER — ROPINIROLE HCL 0.5 MG PO TABS
0.5000 mg | ORAL_TABLET | Freq: Every day | ORAL | Status: DC
  Filled 2019-07-03 (×2): qty 1

## 2019-07-03 NOTE — Progress Notes (Signed)
Tikosyn dose not on unit-not available in Pyxis. Pharmacy notified to send missing dose. Jessie Foot, RN

## 2019-07-03 NOTE — Progress Notes (Signed)
Pharmacy: Dofetilide (Tikosyn) - Initial Consult Assessment and Electrolyte Replacement  Pharmacy consulted to assist in monitoring and replacing electrolytes in this 86 y.o. female admitted on 07/03/2019 undergoing dofetilide initiation. First dofetilide dose: 6/21@2000   Assessment:  Patient Exclusion Criteria: If any screening criteria checked as "Yes", then  patient  should NOT receive dofetilide until criteria item is corrected.  If "Yes" please indicate correction plan.  YES  NO Patient  Exclusion Criteria Correction Plan   [x]   []   Baseline QTc interval is greater than or equal to 440 msec. IF above YES box checked dofetilide contraindicated unless patient has ICD; then may proceed if QTc 500-550 msec or with known ventricular conduction abnormalities may proceed with QTc 550-600 msec. QTc = Qtc 456 in junctional rhythm MD aware and okay to continue - QTc has been 429-442 in NSR in past.   []   [x]   Patient is known or suspected to have a digoxin level greater than 2 ng/ml: No results found for: DIGOXIN     []   [x]   Creatinine clearance less than 20 ml/min (calculated using Cockcroft-Gault, actual body weight and serum creatinine): Estimated Creatinine Clearance: 31.4 mL/min (A) (by C-G formula based on SCr of 1.09 mg/dL (H)).     [x]   []  Patient has received drugs known to prolong the QT intervals within the last 48 hours (phenothiazines, tricyclics or tetracyclic antidepressants, erythromycin, H-1 antihistamines, cisapride, fluoroquinolones, azithromycin). Updated information on QT prolonging agents is available to be searched on the following database:QT prolonging agents  Took chlorpheniramine this AM - MD okay with continue with initiation.  Moving forward - would use 2nd generation loratadine or cetirizine for antihistamine    []   [x]   Patient received a dose of hydrochlorothiazide (Oretic) alone or in any combination including triamterene (Dyazide, Maxzide) in the last  48 hours.    []   [x]  Patient received a medication known to increase dofetilide plasma concentrations prior to initial dofetilide dose:  . Trimethoprim (Primsol, Proloprim) in the last 36 hours . Verapamil (Calan, Verelan) in the last 36 hours or a sustained release dose in the last 72 hours . Megestrol (Megace) in the last 5 days  . Cimetidine (Tagamet) in the last 6 hours . Ketoconazole (Nizoral) in the last 24 hours . Itraconazole (Sporanox) in the last 48 hours  . Prochlorperazine (Compazine) in the last 36 hours     []   [x]   Patient is known to have a history of torsades de pointes; congenital or acquired long QT syndromes.    []   [x]   Patient has received a Class 1 antiarrhythmic with less than 2 half-lives since last dose. (Disopyramide, Quinidine, Procainamide, Lidocaine, Mexiletine, Flecainide, Propafenone)    []   [x]   Patient has received amiodarone therapy in the past 3 months or amiodarone level is greater than 0.3 ng/ml. Stopped amiodarone >3 months ago   Patient has been appropriately anticoagulated with Apixaban.  Labs:    Component Value Date/Time   K 4.3 07/03/2019 1113   MG 2.1 07/03/2019 1113     Plan: Potassium: K >/= 4: Appropriate to initiate Tikosyn, no replacement needed    Magnesium: Mg >2: Appropriate to initiate Tikosyn, no replacement needed     Thank you for allowing pharmacy to participate in this patient's care   Antonietta Jewel, PharmD, [facility] Pharmacist  Phone: [phone] 07/03/2019 2:51 PM  Please check AMION for all [facility] phone numbers After 10:00 PM, call [facility] [phone]

## 2019-07-03 NOTE — H&P (Signed)
Primary Care Physician: [name], MD Primary Cardiologist: Dr [name] Primary Electrophysiologist: none Referring Physician: Dr Remo Lipps Crystal Kerr is a 86 y.o. 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  The patient was initially diagnosed with atrial fibrillation in 2015 in the setting of viral gastroenteritis and had TEE/DCCV at that time. She is on Eliquis for a CHADS2VASC score of 5. Patient had been maintained on amiodarone and underwent cardioversions in January, June, and September of 2020. She is followed by pulmonology for her ILD. She denies any significant snoring or alcohol use.  On follow up today, patient reports that she was in her usual state of health until the week of 06/19/19 when she started having symptoms of fatigue and SOB. She also noted increased lower extremity edema. No orthopnea or PND. ECG today shows she is in rate controlled afib. Patient has checked on the price of dofetilide and is agreeable for admission.   Today, she denies symptoms of palpitations, chest pain, orthopnea, PND, dizziness, presyncope, syncope, snoring, daytime somnolence, bleeding, or neurologic sequela. The patient is tolerating medications without difficulties and is otherwise without complaint today.    Atrial Fibrillation Risk Factors:  she does not have symptoms or diagnosis of sleep apnea. she does not have a history of rheumatic fever. she does not have a history of alcohol use. The patient does not have a history of early familial atrial fibrillation or other arrhythmias.  she has a BMI of There is no height or weight on file to calculate BMI.. There were no vitals filed for this visit.  Family History  Problem Relation Age of Onset  . Heart failure Mother   . Diabetes Father   . Heart disease  Other        No family history     Atrial Fibrillation Management history:  Previous antiarrhythmic drugs: amiodarone Previous cardioversions: 2015, 01/2018, 06/2018, 09/2018 Previous ablations: none CHADS2VASC score: 5 Anticoagulation history: Eliquis   Past Medical History:  Diagnosis Date  . Arthritis   . Asthma   . Atrial fibrillation ([place])    a. s/p DCCV in 2015, on Amiodarone and Eliquis  . CHF (congestive heart failure) ([place])   . COPD (chronic obstructive pulmonary disease) ([place])   . Dysrhythmia    afib -hx   . Neuromuscular disorder ([place])    peripheral neuropathy   . Pneumonia    hx of x 2   . PONV (postoperative nausea and vomiting)   . PVC's (premature ventricular contractions)    Past Surgical History:  Procedure Laterality Date  . APPENDECTOMY    . CARDIOVERSION N/A 03/08/2013   Procedure: CARDIOVERSION;  Surgeon: Larey Dresser, MD;  Location: [facility];  Service: Cardiovascular;  Laterality: N/A;  . CHOLECYSTECTOMY    . oophrectomy    . TEE WITHOUT CARDIOVERSION N/A 03/08/2013   Procedure: TRANSESOPHAGEAL ECHOCARDIOGRAM (TEE);  Surgeon: Larey Dresser, MD;  Location: [facility];  Service: Cardiovascular;  Laterality: N/A;  . TONSILLECTOMY    . TRANSURETHRAL RESECTION OF BLADDER TUMOR WITH GYRUS (TURBT-GYRUS)  05/13/2016   urology wiht [facility]  . TRANSURETHRAL RESECTION OF BLADDER TUMOR WITH MITOMYCIN-C N/A 12/24/2017   Procedure: TRANSURETHRAL RESECTION OF BLADDER TUMOR;  Surgeon: Lucas Mallow, MD;  Location: [facility];  Service: Urology;  Laterality: N/A;    Current Facility-Administered  Medications  Medication Dose Route Frequency Provider Last Rate Last Admin  . 0.9 %  sodium chloride infusion  250 mL Intravenous PRN Fenton, Clint R, PA      . acetaminophen (TYLENOL) tablet 650 mg  650 mg Oral Q6H PRN Fenton, Clint R, PA      . albuterol (PROVENTIL) (2.5 MG/3ML) 0.083% nebulizer solution 2.5 mg  2.5 mg Nebulization Q4H PRN Fenton, Clint R, PA        . apixaban (ELIQUIS) tablet 5 mg  5 mg Oral BID Fenton, Clint R, PA      . [START ON 07/04/2019] diltiazem (CARDIZEM CD) 24 hr capsule 120 mg  120 mg Oral Daily Adenike Shidler Hassell Done, MD      . dofetilide (TIKOSYN) capsule 125 mcg  125 mcg Oral BID Fenton, Clint R, PA      . fluticasone furoate-vilanterol (BREO ELLIPTA) 200-25 MCG/INH 1 puff  1 puff Inhalation Daily Fenton, Clint R, PA      . furosemide (LASIX) tablet 20 mg  20 mg Oral Daily Fenton, Clint R, PA      . magnesium oxide (MAG-OX) tablet 400 mg  400 mg Oral Daily Fenton, Clint R, PA      . mirabegron ER (MYRBETRIQ) tablet 25 mg  25 mg Oral Daily Fenton, Clint R, PA      . montelukast (SINGULAIR) tablet 10 mg  10 mg Oral Daily Fenton, Clint R, PA      . rOPINIRole (REQUIP) tablet 0.5 mg  0.5 mg Oral QHS Fenton, Clint R, PA      . sodium chloride flush (NS) 0.9 % injection 3 mL  3 mL Intravenous Q12H Fenton, Clint R, PA      . sodium chloride flush (NS) 0.9 % injection 3 mL  3 mL Intravenous PRN Fenton, Clint R, PA      . traMADol (ULTRAM) tablet 50 mg  50 mg Oral Q12H PRN Fenton, Clint R, PA        No Known Allergies  Social History   Socioeconomic History  . Marital status: Divorced    Spouse name: Not on file  . Number of children: Not on file  . Years of education: Not on file  . Highest education level: Not on file  Occupational History  . Occupation: Retired    Fish farm manager: RETIRED  Tobacco Use  . Smoking status: Former Smoker    Packs/day: 1.00    Years: 20.00    Pack years: 20.00    Types: Cigarettes    Quit date: 04/13/1981    Years since quitting: 38.2  . Smokeless tobacco: Never Used  Vaping Use  . Vaping Use: Never used  Substance and Sexual Activity  . Alcohol use: Not Currently    Comment: Occasional  . Drug use: No  . Sexual activity: Never  Other Topics Concern  . Not on file  Social History Narrative  . Not on file   Social Determinants of Health   Financial Resource Strain: Low Risk   .  Difficulty of Paying Living Expenses: Not very hard  Food Insecurity:   . Worried About Charity fundraiser in the Last Year:   . Arboriculturist in the Last Year:   Transportation Needs:   . Film/video editor (Medical):   Marland Kitchen Lack of Transportation (Non-Medical):   Physical Activity:   . Days of Exercise per Week:   . Minutes of Exercise per Session:   Stress:   .  Feeling of Stress :   Social Connections:   . Frequency of Communication with Friends and Family:   . Frequency of Social Gatherings with Friends and Family:   . Attends Religious Services:   . Active Member of Clubs or Organizations:   . Attends Archivist Meetings:   Marland Kitchen Marital Status:   Intimate Partner Violence:   . Fear of Current or Ex-Partner:   . Emotionally Abused:   Marland Kitchen Physically Abused:   . Sexually Abused:      ROS- All systems are reviewed and negative except as per the HPI above.  Physical Exam: Vitals:   07/03/19 1510  BP: (!) 125/49  Pulse: 81  Resp: 16  Temp: 98.3 F (36.8 C)  TempSrc: Oral  SpO2: 97%    GEN- The patient is well appearing elderly female, alert and oriented x 3 today.   HEENT-head normocephalic, atraumatic, sclera clear, conjunctiva pink, hearing intact, trachea midline. Lungs- Clear to ausculation bilaterally, normal work of breathing Heart- irregular rate and rhythm, no murmurs, rubs or gallops  GI- soft, NT, ND, + BS Extremities- no clubbing, cyanosis. 1+ bilateral edema MS- no significant deformity or atrophy Skin- no rash or lesion Psych- euthymic mood, full affect Neuro- strength and sensation are intact   Wt Readings from Last 3 Encounters:  07/03/19 72.3 kg  06/27/19 73.6 kg  03/20/19 72.6 kg    EKG today demonstrates afib HR 83, QRS 86, QTc 451  Echo 2//20 demonstrated   1. The left ventricle has hyperdynamic systolic function of >97%. The cavity size is normal. There is no left ventricular wall thickness. Echo evidence of impaired relaxation  diastolic filling patterns.  2. Mildly dilated left atrial size.  3. Normal right atrial size.  4. Trivial pericardial effusion, as described above.  5. Normal tricuspid valve.  6. The aortic valve tricuspid. There is moderate thickening and moderate sclerosis of the aortic valve.  7. No atrial level shunt detected by color flow Doppler.  Epic records are reviewed at length today  Assessment and Plan:  1. Persistent atrial fibrillation Amiodarone discontinued 2/2 concern for lung toxicity.  Patient back in rate controlled afib, appears persistent.  Patient ready to pursue dofetilide. Amio discontinued >3 months ago. Patient has checked on the price of the medication and reports it is affordable.  PharmD to screen drugs for QT prolonging agents.  QTc in SR 429-442 ms. Check bmet/mag today. Continue Eliquis 5 mg BID Continue diltiazem 120 mg daily  This patients CHA2DS2-VASc Score and unadjusted Ischemic Stroke Rate (% per year) is equal to 7.2 % stroke rate/year from a score of 5  Above score calculated as 1 point each if present [CHF, HTN, DM, Vascular=MI/PAD/Aortic Plaque, Age if 65-74, or Female] Above score calculated as 2 points each if present [Age > 75, or Stroke/TIA/TE]  2. HTN Stable, no changes today.  3. CAD Three vessel coronary calcification seen on CT. No anginal symptoms.  4. Chronic diastolic CHF Weight up ~2 lbs and she has noted some increased lower extremity edema.  Increase her Lasix to 20 mg daily. She Demon Volante start K+ supplementation with Lasix. bmet as above.    Follow up next week for dofetilide admission.    [facility] [address] [place], [place] [address] [phone] 07/03/2019 4:04 PM   I have seen and examined this patient with Adline Peals.  Agree with above, note added to reflect my findings.  On exam,  irregular, no murmurs, lungs clear. Patient admit for tikosyn load. ECG after each dose. Has  kidney disease and Brynlyn Dade start with 125 mcg.    Treyana Sturgell M. Layne Lebon MD 07/03/2019 4:04 PM

## 2019-07-03 NOTE — Progress Notes (Signed)
Primary Care Physician: [name], MD Primary Cardiologist: Dr [name] Primary Electrophysiologist: Dr Curt Bears Referring Physician: Dr Remo Lipps Crystal Kerr is a 86 y.o. 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  The patient was initially diagnosed with atrial fibrillation in 2015 in the setting of viral gastroenteritis and had TEE/DCCV at that time. She is on Eliquis for a CHADS2VASC score of 5. Patient had been maintained on amiodarone and underwent cardioversions in January, June, and September of 2020. She is followed by pulmonology for her ILD. She denies any significant snoring or alcohol use. Patient reports that she was in her usual state of health until the week of 06/19/19 when she started having symptoms of fatigue and SOB. She also noted increased lower extremity edema. No orthopnea or PND.   On follow up today, patient presents for dofetilide admission. She is doing a little better since her last visit with less edema. She remains in afib. She denies any missed doses of anticoagulation in the last 3 weeks.   Today, she denies symptoms of palpitations, chest pain, orthopnea, PND, dizziness, presyncope, syncope, snoring, daytime somnolence, bleeding, or neurologic sequela. The patient is tolerating medications without difficulties and is otherwise without complaint today.    Atrial Fibrillation Risk Factors:  she does not have symptoms or diagnosis of sleep apnea. she does not have a history of rheumatic fever. she does not have a history of alcohol use. The patient does not have a history of early familial atrial fibrillation or other arrhythmias.  she has a BMI of Body mass index is 28.24 kg/m.Marland Kitchen Filed Weights   07/03/19 1120  Weight: 72.3 kg    Family History  Problem Relation Age of Onset    . Heart failure Mother   . Diabetes Father   . Heart disease Other        No family history     Atrial Fibrillation Management history:  Previous antiarrhythmic drugs: amiodarone Previous cardioversions: 2015, 01/2018, 06/2018, 09/2018 Previous ablations: none CHADS2VASC score: 5 Anticoagulation history: Eliquis   Past Medical History:  Diagnosis Date  . Arthritis   . Asthma   . Atrial fibrillation ([place])    a. s/p DCCV in 2015, on Amiodarone and Eliquis  . CHF (congestive heart failure) ([place])   . COPD (chronic obstructive pulmonary disease) ([place])   . Dysrhythmia    afib -hx   . Neuromuscular disorder ([place])    peripheral neuropathy   . Pneumonia    hx of x 2   . PONV (postoperative nausea and vomiting)   . PVC's (premature ventricular contractions)    Past Surgical History:  Procedure Laterality Date  . APPENDECTOMY    . CARDIOVERSION N/A 03/08/2013   Procedure: CARDIOVERSION;  Surgeon: Larey Dresser, MD;  Location: [facility];  Service: Cardiovascular;  Laterality: N/A;  . CHOLECYSTECTOMY    . oophrectomy    . TEE WITHOUT CARDIOVERSION N/A 03/08/2013   Procedure: TRANSESOPHAGEAL ECHOCARDIOGRAM (TEE);  Surgeon: Larey Dresser, MD;  Location: [facility];  Service: Cardiovascular;  Laterality: N/A;  . TONSILLECTOMY    . TRANSURETHRAL RESECTION OF BLADDER TUMOR WITH GYRUS (TURBT-GYRUS)  05/13/2016   urology wiht [facility]  . TRANSURETHRAL RESECTION OF BLADDER TUMOR WITH MITOMYCIN-C N/A 12/24/2017   Procedure: TRANSURETHRAL RESECTION OF BLADDER TUMOR;  Surgeon: Lucas Mallow, MD;  [facility];  Service: Urology;  Laterality: N/A;    Current Outpatient Medications  Medication Sig Dispense Refill  . acetaminophen (TYLENOL) 325 MG tablet Take 2 tablets (650 mg total) by mouth every 6 (six) hours as needed for mild pain or headache. 30 tablet 0  . albuterol (PROAIR HFA) 108 (90 Base) MCG/ACT inhaler Inhale 2 puffs into the lungs every 4 (four) hours as needed for  wheezing or shortness of breath. 18 g 11  . apixaban (ELIQUIS) 5 MG TABS tablet Take 1 tablet (5 mg total) by mouth 2 (two) times daily. 180 tablet 3  . chlorpheniramine (CHLOR-TRIMETON) 4 MG tablet Take 4 mg by mouth 2 (two) times daily as needed for allergies.    . Diclofenac Sodium (VOLTAREN EX) Apply topically.    Marland Kitchen diltiazem (TIAZAC) 120 MG 24 hr capsule TAKE 1 CAPSULE BY MOUTH DAILY 90 capsule 3  . fluticasone furoate-vilanterol (BREO ELLIPTA) 200-25 MCG/INH AEPB Inhale 1 puff into the lungs daily. Brush teeth or rinse and gargle after use. 60 each 11  . furosemide (LASIX) 20 MG tablet Take 1 tablet (20 mg total) by mouth daily. Use as needed for swelling 90 tablet 3  . MAGNESIUM-OXIDE 400 (241.3 Mg) MG tablet Take 1 tablet by mouth daily.    . montelukast (SINGULAIR) 10 MG tablet Take 1 tablet (10 mg total) by mouth daily. 90 tablet 3  . Multiple Vitamins-Minerals (MULTIVITAMIN WITH MINERALS) tablet Take 1 tablet by mouth daily.    Marland Kitchen MYRBETRIQ 25 MG TB24 tablet TAKE 1 TABLET BY MOUTH DAILY FOR BLADDER SYMPTOMS 30 tablet 6  . rOPINIRole (REQUIP) 0.5 MG tablet Start with 1/2 tablet at bedtime.  May increase to a whole tablet after 3 days if needed for restless legs 30 tablet 6  . traMADol (ULTRAM) 50 MG tablet 1 tab every 12 hrs as needed for pain. 60 tablet 0   No current facility-administered medications for this encounter.    No Known Allergies  Social History   Socioeconomic History  . Marital status: Divorced    Spouse name: Not on file  . Number of children: Not on file  . Years of education: Not on file  . Highest education level: Not on file  Occupational History  . Occupation: Retired    Fish farm manager: RETIRED  Tobacco Use  . Smoking status: Former Smoker    Packs/day: 1.00    Years: 20.00    Pack years: 20.00    Types: Cigarettes    Quit date: 04/13/1981    Years since quitting: 38.2  . Smokeless tobacco: Never Used  Vaping Use  . Vaping Use: Never used  Substance and  Sexual Activity  . Alcohol use: Not Currently    Comment: Occasional  . Drug use: No  . Sexual activity: Never  Other Topics Concern  . Not on file  Social History Narrative  . Not on file   Social Determinants of Health   Financial Resource Strain: Low Risk   . Difficulty of Paying Living Expenses: Not very hard  Food Insecurity:   . Worried About Charity fundraiser in the Last Year:   . Arboriculturist in the Last Year:   Transportation Needs:   . Film/video editor (Medical):   Marland Kitchen Lack of Transportation (Non-Medical):   Physical Activity:   . Days of Exercise per Week:   . Minutes of Exercise per Session:   Stress:   . Feeling of Stress :  Social Connections:   . Frequency of Communication with Friends and Family:   . Frequency of Social Gatherings with Friends and Family:   . Attends Religious Services:   . Active Member of Clubs or Organizations:   . Attends Archivist Meetings:   Marland Kitchen Marital Status:   Intimate Partner Violence:   . Fear of Current or Ex-Partner:   . Emotionally Abused:   Marland Kitchen Physically Abused:   . Sexually Abused:      ROS- All systems are reviewed and negative except as per the HPI above.  Physical Exam: Vitals:   07/03/19 1120  BP: (!) 142/82  Pulse: 100  Weight: 72.3 kg  Height: 5\' 3"  (1.6 m)    GEN- The patient is well appearing elderly female, alert and oriented x 3 today.   HEENT-head normocephalic, atraumatic, sclera clear, conjunctiva pink, hearing intact, trachea midline. Lungs- Clear to ausculation bilaterally, normal work of breathing Heart- irregular rate and rhythm, no murmurs, rubs or gallops  GI- soft, NT, ND, + BS Extremities- no clubbing, cyanosis, or edema MS- no significant deformity or atrophy Skin- no rash or lesion Psych- euthymic mood, full affect Neuro- strength and sensation are intact   Wt Readings from Last 3 Encounters:  07/03/19 72.3 kg  06/27/19 73.6 kg  03/20/19 72.6 kg    EKG today  demonstrates afib HR 100, PVCs, QRS 76, QTc 456  Echo 2//20 demonstrated   1. The left ventricle has hyperdynamic systolic function of >82%. The cavity size is normal. There is no left ventricular wall thickness. Echo evidence of impaired relaxation diastolic filling patterns.  2. Mildly dilated left atrial size.  3. Normal right atrial size.  4. Trivial pericardial effusion, as described above.  5. Normal tricuspid valve.  6. The aortic valve tricuspid. There is moderate thickening and moderate sclerosis of the aortic valve.  7. No atrial level shunt detected by color flow Doppler.  Epic records are reviewed at length today  Assessment and Plan:  1. Persistent atrial fibrillation Amiodarone discontinued 2/2 concern for lung toxicity.  Patient agreeable to dofetilide admission. Amio discontinued >3 months ago. Patient has checked on the price of the medication and reports it is affordable. PharmD has screened medications and no contraindicated medications on board. Patient did take chlorpheniramine this AM. Counseled patient to change to 2nd gen antihistamines moving forward. QTc in SR 429-442 ms. Labs today show creatinine at 1.09, K+ 4.3 and mag 2.1, CrCl calculated at 37 mL/min Continue Eliquis 5 mg BID Continue diltiazem 120 mg daily  This patients CHA2DS2-VASc Score and unadjusted Ischemic Stroke Rate (% per year) is equal to 7.2 % stroke rate/year from a score of 5  Above score calculated as 1 point each if present [CHF, HTN, DM, Vascular=MI/PAD/Aortic Plaque, Age if 65-74, or Female] Above score calculated as 2 points each if present [Age > 75, or Stroke/TIA/TE]  2. HTN Stable, no changes today.  3. CAD Three vessel coronary calcification seen on CT. No anginal symptoms.   4. Chronic diastolic CHF Weight down since last visit and edema improved.   To be admitted later today once a bed becomes available.     [facility] [address] [place],  [address] [phone] 07/03/2019 12:04 PM

## 2019-07-04 LAB — PROTIME-INR
INR: 1.3 — ABNORMAL HIGH (ref 0.8–1.2)
Prothrombin Time: 15.6 seconds — ABNORMAL HIGH (ref 11.4–15.2)

## 2019-07-04 LAB — BASIC METABOLIC PANEL
Anion gap: 7 (ref 5–15)
BUN: 26 mg/dL — ABNORMAL HIGH (ref 8–23)
CO2: 28 mmol/L (ref 22–32)
Calcium: 8.8 mg/dL — ABNORMAL LOW (ref 8.9–10.3)
Chloride: 103 mmol/L (ref 98–111)
Creatinine, Ser: 1.16 mg/dL — ABNORMAL HIGH (ref 0.44–1.00)
GFR calc Af Amer: 47 mL/min — ABNORMAL LOW (ref >60)
GFR calc non Af Amer: 41 mL/min — ABNORMAL LOW (ref >60)
Glucose, Bld: 89 mg/dL (ref 70–99)
Potassium: 4.1 mmol/L (ref 3.5–5.1)
Sodium: 138 mmol/L (ref 135–145)

## 2019-07-04 LAB — MAGNESIUM: Magnesium: 2.2 mg/dL (ref 1.7–2.4)

## 2019-07-04 LAB — GLUCOSE, CAPILLARY: Glucose-Capillary: 86 mg/dL (ref 70–99)

## 2019-07-04 NOTE — Care Management (Addendum)
0837 07-04-19 Case Manager received referral for Tikosyn cost. Benefits check submitted and Case Manager will follow for cost and discuss pharmacy of choice with patient. Bethena Roys, RN, BSN Case Manager     07-04-19 1504 Case Manager spoke with patient and she uses [facility] [address]. Patient would like for 30 day supply and refills to be e-scribed to this location. Case Manager did call [facility] and the medication is not available. Patient is agreeable to have 30 day Rx filled here at [facility] and refills to be sent to [facility] [address]. No further needs from Case Manager at this time. Graves-Bigelow, Ocie Cornfield, RN, BSN Case Manager

## 2019-07-04 NOTE — Plan of Care (Signed)
  Problem: Education: Goal: Understanding of medication regimen will improve Outcome: Progressing   Problem: Activity: Goal: Ability to tolerate increased activity will improve Outcome: Progressing Pt mildly DOE d/t increased HR

## 2019-07-04 NOTE — Progress Notes (Addendum)
Electrophysiology Rounding Note  Patient Name: Crystal Kerr Date of Encounter: 07/04/2019  Primary Cardiologist: Kirk Ruths, MD  Electrophysiologist: New to [facility]   Subjective   Pt remains in afib on Tikosyn 125 mcg BID   QTc from EKG last pm shows stable QTc at ~480 ms  The patient is doing well today.  At this time, the patient denies chest pain, shortness of breath, or any new concerns.  Inpatient Medications    Scheduled Meds:  apixaban  5 mg Oral BID   diltiazem  120 mg Oral Daily   dofetilide  125 mcg Oral BID   fluticasone furoate-vilanterol  1 puff Inhalation Daily   furosemide  20 mg Oral Daily   magnesium oxide  400 mg Oral Daily   mirabegron ER  25 mg Oral Daily   montelukast  10 mg Oral Daily   rOPINIRole  0.5 mg Oral QHS   sodium chloride flush  3 mL Intravenous Q12H   Continuous Infusions:  sodium chloride     PRN Meds: sodium chloride, acetaminophen, albuterol, sodium chloride flush, traMADol   Vital Signs    Vitals:   07/03/19 1510 07/03/19 2016 07/04/19 0610  BP: (!) 125/49 (!) 132/51 132/65  Pulse: 81 63 81  Resp: 16 16 15   Temp: 98.3 F (36.8 C) 98.6 F (37 C) 98.5 F (36.9 C)  TempSrc: Oral Oral Oral  SpO2: 97% 93% 94%  Weight:   71.4 kg    Intake/Output Summary (Last 24 hours) at 07/04/2019 0752 Last data filed at 07/04/2019 0026 Gross per 24 hour  Intake 275 ml  Output 400 ml  Net -125 ml   Filed Weights   07/04/19 0610  Weight: 71.4 kg    Physical Exam    GEN- The patient is well appearing, alert and oriented x 3 today.   Head- normocephalic, atraumatic Eyes-  Sclera clear, conjunctiva pink Ears- hearing intact Oropharynx- clear Neck- supple Lungs- Clear to ausculation bilaterally, normal work of breathing Heart- IRR/IRR rate and rhythm, no murmurs, rubs or gallops GI- soft, NT, ND, + BS Extremities- no clubbing, cyanosis, or edema Skin- no rash or lesion Psych- euthymic mood, full affect Neuro-  strength and sensation are intact  Labs    CBC No results for input(s): WBC, NEUTROABS, HGB, HCT, MCV, PLT in the last 72 hours. Basic Metabolic Panel Recent Labs    07/03/19 1113 07/04/19 0418  NA 135 138  K 4.3 4.1  CL 100 103  CO2 22 28  GLUCOSE 92 89  BUN 32* 26*  CREATININE 1.09* 1.16*  CALCIUM 9.2 8.8*  MG 2.1 2.2    Potassium  Date/Time Value Ref Range Status  07/04/2019 04:18 AM 4.1 3.5 - 5.1 mmol/L Final   Magnesium  Date/Time Value Ref Range Status  07/04/2019 04:18 AM 2.2 1.7 - 2.4 mg/dL Final    Comment:    Performed at [facility], [place] [address] [place], [place] [address]    Telemetry    AF 80-100 (personally reviewed)  Radiology    No results found.   Patient Profile     Crystal Kerr is a 86 y.o. female with a past medical history significant for persistent atrial fibrillation.  They were admitted for tikosyn load.   Assessment & Plan    Persistent atrial fibrillation Pt remains in afib on Tikosyn 125 mcg BID  Continue Eliquis Electrolytes stable.  CHA2DS2VASC is at least 5  2. HTN Stable. Follow  3.  Chronic diastolic CHF Po lasix increased. Follow.   If pt does not convert chemically, plan on DCCV tomorrow    For questions or updates, please contact [place] Please consult [email or web address] for contact info under Cardiology/STEMI.  Signed, Shirley Friar, PA-C  07/04/2019, 7:52 AM   I have seen and examined this patient with Oda Kilts.  Agree with above, note added to reflect my findings.  On exam, irregular, tachycardic.  Patient remains in atrial fibrillation.  We Tiea Manninen continue with current dose of dofetilide.  Plan for cardioversion tomorrow.  Novella Abraha M. Alexandrea Westergard MD 07/04/2019 9:11 AM

## 2019-07-04 NOTE — TOC Benefit Eligibility Note (Signed)
Transition of Care [facility]) Benefit Eligibility Note    Patient Details  Name: Crystal Kerr MRN: [id number] Date of Birth: 07-26-1924   Medication/Dose: dofetilide  Covered?: Yes  Tier: Other (tier 4)  Prescription Coverage Preferred Pharmacy: [facility]  Spoke with Person/Company/Phone Number:: [employer]  Co-Pay: $3.70 for both 30 day retail and 90 day mail order  Prior Approval: No          Delorse Lek Phone Number: 07/04/2019, 1:09 PM

## 2019-07-04 NOTE — Progress Notes (Signed)
Morning EKG reviewed  Shows pt remains in afib at 93 bpm with stable QTc at ~475 ms.  Continue Tikosyn 125 mcg BID.   Pt will be NPO after midnight for DCCV if remains in [place], [place]  Pager: [phone]  07/04/2019 1:20 PM

## 2019-07-04 NOTE — Progress Notes (Signed)
Pharmacy: Dofetilide (Tikosyn) - Follow Up Assessment and Electrolyte Replacement  Pharmacy consulted to assist in monitoring and replacing electrolytes in this 86 y.o. female admitted on 07/03/2019 undergoing dofetilide initiation. First dofetilide dose: 6/21@2117   Labs:    Component Value Date/Time   K 4.1 07/04/2019 0418   MG 2.2 07/04/2019 0418     Plan: Potassium: K >/= 4: No additional supplementation needed  Magnesium: Mg > 2: No additional supplementation needed  Thank you for allowing pharmacy to participate in this patient's care   Antonietta Jewel, PharmD, [place] Pharmacist  Phone: [phone] 07/04/2019 7:22 AM  Please check AMION for all [place] phone numbers After 10:00 PM, call [place] [phone]

## 2019-07-05 ENCOUNTER — Encounter (HOSPITAL_COMMUNITY)
Admission: RE | Disposition: A | Discharge status: Home / Self Care | Payer: Self-pay | Source: Ambulatory Visit | Attending: Cardiology

## 2019-07-05 ENCOUNTER — Encounter (HOSPITAL_COMMUNITY): Payer: Self-pay | Admitting: Cardiology

## 2019-07-05 ENCOUNTER — Other Ambulatory Visit: Payer: Self-pay

## 2019-07-05 ENCOUNTER — Inpatient Hospital Stay (HOSPITAL_COMMUNITY): Payer: Medicare Other | Admitting: Anesthesiology

## 2019-07-05 HISTORY — PX: CARDIOVERSION: SHX1299

## 2019-07-05 LAB — BASIC METABOLIC PANEL
Anion gap: 9 (ref 5–15)
BUN: 20 mg/dL (ref 8–23)
CO2: 28 mmol/L (ref 22–32)
Calcium: 9.1 mg/dL (ref 8.9–10.3)
Chloride: 101 mmol/L (ref 98–111)
Creatinine, Ser: 1.08 mg/dL — ABNORMAL HIGH (ref 0.44–1.00)
GFR calc Af Amer: 52 mL/min — ABNORMAL LOW (ref >60)
GFR calc non Af Amer: 45 mL/min — ABNORMAL LOW (ref >60)
Glucose, Bld: 96 mg/dL (ref 70–99)
Potassium: 4.2 mmol/L (ref 3.5–5.1)
Sodium: 138 mmol/L (ref 135–145)

## 2019-07-05 LAB — MAGNESIUM: Magnesium: 2.1 mg/dL (ref 1.7–2.4)

## 2019-07-05 SURGERY — CARDIOVERSION
Anesthesia: General

## 2019-07-05 MED ORDER — PROPOFOL 10 MG/ML IV BOLUS
INTRAVENOUS | Status: DC | PRN
  Administered 2019-07-05: 50 mg via INTRAVENOUS

## 2019-07-05 MED ORDER — SODIUM CHLORIDE 0.9 % IV SOLN: INTRAVENOUS | Status: DC

## 2019-07-05 MED ORDER — LIDOCAINE 2% (20 MG/ML) 5 ML SYRINGE
INTRAMUSCULAR | Status: DC | PRN
  Administered 2019-07-05: 60 mg via INTRAVENOUS

## 2019-07-05 NOTE — Anesthesia Procedure Notes (Signed)
Procedure Name: General with mask airway Date/Time: 07/05/2019 9:38 AM Performed by: Leonor Liv, CRNA Oxygen Delivery Method: Ambu bag Preoxygenation: Pre-oxygenation with 100% oxygen Induction Type: IV induction Ventilation: Mask ventilation without difficulty Dental Injury: Teeth and Oropharynx as per pre-operative assessment

## 2019-07-05 NOTE — Interval H&P Note (Signed)
History and Physical Interval Note:  07/05/2019 9:31 AM  Crystal Kerr  has presented today for surgery, with the diagnosis of afib.  The various methods of treatment have been discussed with the patient and family. After consideration of risks, benefits and other options for treatment, the patient has consented to  Procedure(s): CARDIOVERSION (N/A) as a surgical intervention.  The patient's history has been reviewed, patient examined, no change in status, stable for surgery.  I have reviewed the patient's chart and labs.  Questions were answered to the patient's satisfaction.     [name]

## 2019-07-05 NOTE — Progress Notes (Addendum)
Electrophysiology Rounding Note  Patient Name: Crystal Kerr Date of Encounter: 07/05/2019  Primary Cardiologist: Kirk Ruths, MD  Electrophysiologist: None    Subjective   Pt remains in afib on Tikosyn 125 mcg BID   QTc from EKG last pm shows stable QTc at 475 ms  The patient is doing well today.  At this time, the patient denies chest pain, shortness of breath, or any new concerns.  Inpatient Medications    Scheduled Meds:  apixaban  5 mg Oral BID   diltiazem  120 mg Oral Daily   dofetilide  125 mcg Oral BID   fluticasone furoate-vilanterol  1 puff Inhalation Daily   furosemide  20 mg Oral Daily   magnesium oxide  400 mg Oral Daily   mirabegron ER  25 mg Oral Daily   montelukast  10 mg Oral Daily   rOPINIRole  0.5 mg Oral QHS   sodium chloride flush  3 mL Intravenous Q12H   Continuous Infusions:  sodium chloride     PRN Meds: sodium chloride, acetaminophen, albuterol, sodium chloride flush, traMADol   Vital Signs    Vitals:   07/04/19 0610 07/04/19 1403 07/04/19 2008 07/05/19 0520  BP: 132/65 121/73 (!) 140/54 (!) 160/95  Pulse: 81 80 88 92  Resp: 15 16 17 18   Temp: 98.5 F (36.9 C) 98.4 F (36.9 C) 98.1 F (36.7 C) 98.6 F (37 C)  TempSrc: Oral Oral Oral Oral  SpO2: 94% 98% 94% 94%  Weight: 71.4 kg       Intake/Output Summary (Last 24 hours) at 07/05/2019 0758 Last data filed at 07/04/2019 2000 Gross per 24 hour  Intake 360 ml  Output --  Net 360 ml   Filed Weights   07/04/19 0610  Weight: 71.4 kg    Physical Exam    GEN- The patient is well appearing, alert and oriented x 3 today.   Head- normocephalic, atraumatic Eyes-  Sclera clear, conjunctiva pink Ears- hearing intact Oropharynx- clear Neck- supple Lungs- Clear to ausculation bilaterally, normal work of breathing Heart- Regular rate and rhythm, no murmurs, rubs or gallops GI- soft, NT, ND, + BS Extremities- no clubbing, cyanosis, or edema Skin- no rash or  lesion Psych- euthymic mood, full affect Neuro- strength and sensation are intact  Labs    CBC No results for input(s): WBC, NEUTROABS, HGB, HCT, MCV, PLT in the last 72 hours. Basic Metabolic Panel Recent Labs    07/04/19 0418 07/05/19 0325  NA 138 138  K 4.1 4.2  CL 103 101  CO2 28 28  GLUCOSE 89 96  BUN 26* 20  CREATININE 1.16* 1.08*  CALCIUM 8.8* 9.1  MG 2.2 2.1    Potassium  Date/Time Value Ref Range Status  07/05/2019 03:25 AM 4.2 3.5 - 5.1 mmol/L Final   Magnesium  Date/Time Value Ref Range Status  07/05/2019 03:25 AM 2.1 1.7 - 2.4 mg/dL Final    Comment:    Performed at [facility], [place] [address], [place], [place] [address]    Telemetry    AF 70-90s (personally reviewed)  Radiology    No results found.   Patient Profile     Crystal Kerr is a 86 y.o. female with a past medical history significant for persistent atrial fibrillation.  They were admitted for tikosyn load.   Assessment & Plan    Persistent atrial fibrillation Pt remains in afib on Tikosyn 125 mcg BID  Continue Eliquis Electrolytes stable.  CHA2DS2VASC is  at least 5  2. HTN Follow  3. Chronic diastolic CHF Stable on po lasix.   If pt does not convert chemically, plan on DCCV  this morning.     For questions or updates, please contact [place] Please consult [email or web address] for contact info under Cardiology/STEMI.  Signed, Shirley Friar, PA-C  07/05/2019, 7:58 AM   I have seen and examined this patient with Oda Kilts.  Agree with above, note added to reflect my findings.  On exam, RRR, no murmurs, lungs clear. DCCV today with continued tikosyn loading. Likely discharge tomorrow.    Annabell Oconnor M. Imberly Troxler MD 07/05/2019 10:10 PM

## 2019-07-05 NOTE — Transfer of Care (Signed)
Immediate Anesthesia Transfer of Care Note  Patient: Crystal Kerr  Procedure(s) Performed: CARDIOVERSION (N/A )  Patient Location: Endoscopy Unit  Anesthesia Type:General  Level of Consciousness: drowsy  Airway & Oxygen Therapy: Patient Spontanous Breathing and Patient connected to nasal cannula oxygen  Post-op Assessment: Report given to RN and Post -op Vital signs reviewed and stable  Post vital signs: Reviewed and stable  Last Vitals:  Vitals Value Taken Time  BP    Temp    Pulse 66 07/05/19 0937  Resp 15 07/05/19 0937  SpO2 95 % 07/05/19 0937  Vitals shown include unvalidated device data.  Last Pain:  Vitals:   07/05/19 0855  TempSrc:   PainSc: 0-No pain      Patients Stated Pain Goal: 0 (40/68/40 3353)  Complications: No complications documented.

## 2019-07-05 NOTE — Progress Notes (Signed)
Pharmacy: Dofetilide (Tikosyn) - Follow Up Assessment and Electrolyte Replacement  Pharmacy consulted to assist in monitoring and replacing electrolytes in this 86 y.o. female admitted on 07/03/2019 undergoing dofetilide initiation. First dofetilide dose: 6/21@2117   Labs:    Component Value Date/Time   K 4.2 07/05/2019 0325   MG 2.1 07/05/2019 0325     Plan: Potassium: K >/= 4: No additional supplementation needed  Magnesium: Mg > 2: No additional supplementation needed  Thank you for allowing pharmacy to participate in this patient's care   Cristela Felt, PharmD PGY1 Pharmacy Resident [employer]: [phone]  07/05/2019 7:08 AM  Please check AMION for all [place] phone numbers After 10:00 PM, call [place] [phone]

## 2019-07-05 NOTE — Anesthesia Postprocedure Evaluation (Signed)
Anesthesia Post Note  Patient: Crystal Kerr  Procedure(s) Performed: CARDIOVERSION (N/A )     Patient location during evaluation: PACU Anesthesia Type: General Level of consciousness: sedated Pain management: pain level controlled Vital Signs Assessment: post-procedure vital signs reviewed and stable Respiratory status: spontaneous breathing and respiratory function stable Cardiovascular status: stable Postop Assessment: no apparent nausea or vomiting Anesthetic complications: no   No complications documented.  Last Vitals:  Vitals:   07/05/19 0951 07/05/19 1001  BP: (!) 126/47 (!) 125/56  Pulse: 66 73  Resp: (!) 21 20  Temp:    SpO2: 100% 97%    Last Pain:  Vitals:   07/05/19 1001  TempSrc:   PainSc: 0-No pain                 Armen Waring DANIEL

## 2019-07-05 NOTE — Progress Notes (Signed)
Morning EKG reviewed  Shows has converted to NSR s/p DCCV at 73 bpm with borderline QTc at ~480 ms.  Continue Tikosyn 125 mcg BID for now.   Plan for home tomorrow if QTc remains stable.    Shirley Friar, PA-C  Pager: [phone]  07/05/2019 12:19 PM

## 2019-07-05 NOTE — Anesthesia Preprocedure Evaluation (Addendum)
Anesthesia Evaluation  Patient identified by MRN, date of birth, ID band Patient awake    Reviewed: Allergy & Precautions, NPO status , Patient's Chart, lab work & pertinent test results  History of Anesthesia Complications (+) PONV and history of anesthetic complications  Airway Mallampati: II  TM Distance: >3 FB Neck ROM: Full    Dental  (+) Dental Advisory Given, Edentulous Upper, Edentulous Lower   Pulmonary COPD, former smoker,    Pulmonary exam normal        Cardiovascular + dysrhythmias Atrial Fibrillation  Rhythm:Irregular Rate:Tachycardia  IMPRESSIONS    1. The left ventricle has hyperdynamic systolic function of >29%. The  cavity size is normal. There is no left ventricular wall thickness. Echo  evidence of impaired relaxation diastolic filling patterns.  2. Mildly dilated left atrial size.  3. Normal right atrial size.  4. Trivial pericardial effusion, as described above.  5. Normal tricuspid valve.  6. The aortic valve tricuspid. There is moderate thickening and moderate  sclerosis of the aortic valve.  7. No atrial level shunt detected by color flow Doppler.    Neuro/Psych negative neurological ROS  negative psych ROS   GI/Hepatic negative GI ROS, Neg liver ROS,   Endo/Other  negative endocrine ROS  Renal/GU Renal InsufficiencyRenal disease     Musculoskeletal   Abdominal   Peds  Hematology   Anesthesia Other Findings   Reproductive/Obstetrics                           Anesthesia Physical  Anesthesia Plan  ASA: III  Anesthesia Plan: General   Post-op Pain Management:    Induction: Intravenous  PONV Risk Score and Plan: 4 or greater and Ondansetron, Diphenhydramine, TIVA and Treatment may vary due to age or medical condition  Airway Management Planned: Mask  Additional Equipment:   Intra-op Plan:   Post-operative Plan:   Informed Consent:      Dental advisory given  Plan Discussed with:   Anesthesia Plan Comments:         Anesthesia Quick Evaluation

## 2019-07-05 NOTE — CV Procedure (Signed)
   Electrical Cardioversion Procedure Note Crystal Kerr [id number] 12/14/1924  Procedure: Electrical Cardioversion Indications:  Atrial Fibrillation  Time Out: Verified patient identification, verified procedure,medications/allergies/relevent history reviewed, required imaging and test results available.  Performed  Procedure Details  The patient was NPO after midnight. Anesthesia was administered at the beside  by Dr.Singer with 50mg  of Diprovan and 60mg  Lidocaine.  Cardioversion was done with synchronized biphasic defibrillation with AP pads with 150watts.  The patient converted to normal sinus rhythm. The patient tolerated the procedure well   IMPRESSION:  Successful cardioversion of atrial fibrillation    Crystal Kerr 07/05/2019, 9:32 AM

## 2019-07-06 ENCOUNTER — Encounter (HOSPITAL_COMMUNITY): Payer: Self-pay | Admitting: Cardiology

## 2019-07-06 ENCOUNTER — Telehealth: Payer: Self-pay | Admitting: *Deleted

## 2019-07-06 DIAGNOSIS — M25552 Pain in left hip: Secondary | ICD-10-CM

## 2019-07-06 LAB — BASIC METABOLIC PANEL
Anion gap: 11 (ref 5–15)
BUN: 19 mg/dL (ref 8–23)
CO2: 28 mmol/L (ref 22–32)
Calcium: 8.9 mg/dL (ref 8.9–10.3)
Chloride: 99 mmol/L (ref 98–111)
Creatinine, Ser: 1.15 mg/dL — ABNORMAL HIGH (ref 0.44–1.00)
GFR calc Af Amer: 48 mL/min — ABNORMAL LOW (ref >60)
GFR calc non Af Amer: 41 mL/min — ABNORMAL LOW (ref >60)
Glucose, Bld: 90 mg/dL (ref 70–99)
Potassium: 3.8 mmol/L (ref 3.5–5.1)
Sodium: 138 mmol/L (ref 135–145)

## 2019-07-06 LAB — MAGNESIUM: Magnesium: 2.1 mg/dL (ref 1.7–2.4)

## 2019-07-06 LAB — GLUCOSE, CAPILLARY: Glucose-Capillary: 91 mg/dL (ref 70–99)

## 2019-07-06 MED ORDER — TRAMADOL HCL 50 MG PO TABS: 50.0000 mg | ORAL_TABLET | Freq: Two times a day (BID) | ORAL | 0 refills | Status: DC | PRN

## 2019-07-06 MED ORDER — DOFETILIDE 125 MCG PO CAPS: 125.0000 ug | ORAL_CAPSULE | Freq: Two times a day (BID) | ORAL | 3 refills | Status: DC

## 2019-07-06 MED ORDER — POTASSIUM CHLORIDE CRYS ER 20 MEQ PO TBCR
40.0000 meq | EXTENDED_RELEASE_TABLET | Freq: Once | ORAL | Status: AC
  Administered 2019-07-06: 40 meq via ORAL
  Filled 2019-07-06: qty 2

## 2019-07-06 MED FILL — DOFETILIDE 125 MCG CAPS: 125 | 30 days supply | Qty: 60 | Fill #0

## 2019-07-06 NOTE — Progress Notes (Signed)
Pharmacy: Dofetilide (Tikosyn) - Follow Up Assessment and Electrolyte Replacement  Pharmacy consulted to assist in monitoring and replacing electrolytes in this 86 y.o. female admitted on 07/03/2019 undergoing dofetilide initiation and is s/p DCCV on 6/23 with conversion to NSR. First dofetilide dose: 6/21@2117   Labs:    Component Value Date/Time   K 3.8 07/06/2019 0437   MG 2.1 07/06/2019 0437     Plan: Potassium: K 3.8-3.9:  Give KCl 40 mEq po x1   Magnesium: Mg > 2: No additional supplementation needed  Thank you for allowing pharmacy to participate in this patient's care   Cristela Felt, PharmD PGY1 Pharmacy Resident [employer]: [phone]  07/06/2019 7:18 AM  Please check AMION for all [place] phone numbers After 10:00 PM, call [place] [phone]

## 2019-07-06 NOTE — Telephone Encounter (Signed)
[facility] [place] [place] faxed a request for tramadol.  Last written: 01/23/19 Last ov: 03/20/19 Next ov: none Contract: none UDS: none

## 2019-07-06 NOTE — Care Management Important Message (Signed)
Important Message  Patient Details  Name: Crystal Kerr MRN: [id number] Date of Birth: 07/26/1924   Medicare Important Message Given:  Yes  Patient left Prior to IM delivery.  IM mailed to the patient home address.   Sterling Mondo 07/06/2019, 2:30 PM

## 2019-07-06 NOTE — Discharge Summary (Addendum)
ELECTROPHYSIOLOGY PROCEDURE DISCHARGE SUMMARY    Patient ID: Crystal Kerr,  MRN: [id number], DOB/AGE: 06-03-1924 86 y.o.  Admit date: 07/03/2019 Discharge date: 07/06/2019  Primary Care Physician: [name], MD  Primary Cardiologist: Kirk Ruths, MD  Electrophysiologist: Dr. Curt Bears  Primary Discharge Diagnosis:  1.  Persistent atrial fibrillation status post Tikosyn loading this admission  Secondary Discharge Diagnosis:  2. HTN 2. Chronic diastolic CHF  No Known Allergies   Procedures This Admission:  1.  Tikosyn loading 2.  Direct current cardioversion on Wednesday July 05, 2019  by Dr Radford Pax which successfully restored SR.  There were no early apparent complications.   Brief HPI: Crystal Kerr is a 86 y.o. female with a past medical history as noted above.  They were referred to EP in the outpatient setting for treatment options of atrial fibrillation.  Risks, benefits, and alternatives to Tikosyn were reviewed with the patient who wished to proceed.    Hospital Course:  The patient was admitted and Tikosyn was initiated.  Renal function and electrolytes were followed during the hospitalization.  Their QTc remained stable.  On 07/05/2019 they underwent direct current cardioversion which restored sinus rhythm.  They were monitored until discharge on telemetry which demonstrated that she unfortunately reverted back to atrial fibrillation shortly after DCCV, with controlled rates.  On the day of discharge, they were examined by Dr. Curt Bears  who considered them stable for discharge to home with close AF clinic follow up to consider further DCCV.    EKG prior to discharged showed atrial fibrillation at 98 bpm with QTc ~475-480 ms (Stable for patient this admission)   Physical Exam: Vitals:   07/05/19 1001 07/05/19 1656 07/05/19 2136 07/06/19 0613  BP: (!) 125/56 102/72 135/74 125/67  Pulse: 73 94 89 62  Resp: 20 19 16 16   Temp:  98.3 F (36.8  C) 99.3 F (37.4 C) 97.7 F (36.5 C)  TempSrc:  Oral Oral Oral  SpO2: 97% 96% 94% 96%  Weight:        GEN- The patient is well appearing, alert and oriented x 3 today.   HEENT: normocephalic, atraumatic; sclera clear, conjunctiva pink; hearing intact; oropharynx clear; neck supple, no JVP Lymph- no cervical lymphadenopathy Lungs- Clear to ausculation bilaterally, normal work of breathing.  No wheezes, rales, rhonchi Heart- Irregularly irregular rate and rhythm, no murmurs, rubs or gallops, PMI not laterally displaced GI- soft, non-tender, non-distended, bowel sounds present, no hepatosplenomegaly Extremities- no clubbing, cyanosis, or edema; DP/PT/radial pulses 2+ bilaterally MS- no significant deformity or atrophy Skin- warm and dry, no rash or lesion Psych- euthymic mood, full affect Neuro- strength and sensation are intact   Labs:   Lab Results  Component Value Date   WBC 10.5 09/30/2018   HGB 13.4 09/30/2018   HCT 42.5 09/30/2018   MCV 86.4 09/30/2018   PLT 263 09/30/2018    Recent Labs  Lab 07/06/19 0437  NA 138  K 3.8  CL 99  CO2 28  BUN 19  CREATININE 1.15*  CALCIUM 8.9  GLUCOSE 90     Discharge Medications:  Allergies as of 07/06/2019   No Known Allergies      Medication List     TAKE these medications    acetaminophen 325 MG tablet Commonly known as: TYLENOL Take 2 tablets (650 mg total) by mouth every 6 (six) hours as needed for mild pain or headache.   albuterol 108 (90 Base) MCG/ACT inhaler Commonly known as:  ProAir HFA Inhale 2 puffs into the lungs every 4 (four) hours as needed for wheezing or shortness of breath.   apixaban 5 MG Tabs tablet Commonly known as: Eliquis Take 1 tablet (5 mg total) by mouth 2 (two) times daily.   Breo Ellipta 200-25 MCG/INH Aepb Generic drug: fluticasone furoate-vilanterol Inhale 1 puff into the lungs daily. Brush teeth or rinse and gargle after use.   chlorpheniramine 4 MG tablet Commonly known as:  CHLOR-TRIMETON Take 4 mg by mouth 2 (two) times daily as needed for allergies.   diltiazem 120 MG 24 hr capsule Commonly known as: TIAZAC TAKE 1 CAPSULE BY MOUTH DAILY   dofetilide 125 MCG capsule Commonly known as: TIKOSYN Take 1 capsule (125 mcg total) by mouth 2 (two) times daily.   furosemide 20 MG tablet Commonly known as: LASIX Take 1 tablet (20 mg total) by mouth daily. Use as needed for swelling   MAGnesium-Oxide 400 (241.3 Mg) MG tablet Generic drug: magnesium oxide Take 1 tablet by mouth at bedtime.   montelukast 10 MG tablet Commonly known as: SINGULAIR Take 1 tablet (10 mg total) by mouth daily. What changed: when to take this   multivitamin with minerals tablet Take 1 tablet by mouth daily.   Myrbetriq 25 MG Tb24 tablet Generic drug: mirabegron ER TAKE 1 TABLET BY MOUTH DAILY FOR BLADDER SYMPTOMS What changed: See the new instructions.   rOPINIRole 0.5 MG tablet Commonly known as: Requip Start with 1/2 tablet at bedtime.  May increase to a whole tablet after 3 days if needed for restless legs What changed:  how much to take how to take this when to take this reasons to take this additional instructions   traMADol 50 MG tablet Commonly known as: ULTRAM 1 tab every 12 hrs as needed for pain. What changed:  how much to take how to take this when to take this reasons to take this additional instructions   VOLTAREN EX Apply 1 application topically 2 (two) times daily as needed (pain).        Disposition:     Follow-up Information     [facility] Follow up on 07/13/2019.   Specialty: Cardiology Why: at 1100 for post hospital tikosyn follow up. Code for July is 4009 Contact information: [address] [id number] [place] [address] [phone]        Constance Haw, MD Follow up on 08/15/2019.   Specialty: Cardiology Why: at 2 pm for 1 month tikosyn follow up Contact  information: [place] [address] [phone]                 Duration of Discharge Encounter: Greater than 30 minutes including physician time.  Signed, Shirley Friar, PA-C  07/06/2019 11:22 AM    I have seen and examined this patient with Oda Kilts.  Agree with above, note added to reflect my findings.  On exam, irregular rhythm.  Unfortunately, she has gone back into atrial fibrillation.  We Rebekha Diveley plan to keep her on her dofetilide and have her follow-up in A. fib clinic.  They may wish to try a repeat cardioversion to see if this Bailie Christenbury keep her in rhythm.  Chia Rock M. Brandy Zuba MD 07/06/2019 11:44 AM

## 2019-07-06 NOTE — Progress Notes (Signed)
Evening EKG reviewed  Unfortunately, it shows pt went back into AF at approx 1355 yesterday afternoon at 77 bpm with stable QTc at ~470 ms.  Continue Tikosyn 125 mcg BID.   Will plan for discharge today on drug if QTc remains stable, with close follow up in AF clinic. Would consider at least one additional DCCV before considering drug failed.   Shirley Friar, PA-C  Pager: [phone]  07/06/2019 7:57 AM

## 2019-07-13 ENCOUNTER — Encounter (HOSPITAL_COMMUNITY): Payer: Self-pay | Admitting: Physician Assistant

## 2019-07-13 ENCOUNTER — Ambulatory Visit (HOSPITAL_COMMUNITY)
Admit: 2019-07-13 | Discharge: 2019-07-13 | Disposition: A | Discharge status: Home / Self Care | Payer: Medicare Other | Attending: Physician Assistant | Admitting: Physician Assistant

## 2019-07-13 ENCOUNTER — Other Ambulatory Visit: Payer: Self-pay

## 2019-07-13 VITALS — BP 142/70 | HR 94 | Ht 63.0 in | Wt 159.6 lb

## 2019-07-13 DIAGNOSIS — G629 Polyneuropathy, unspecified: Secondary | ICD-10-CM | POA: Diagnosis not present

## 2019-07-13 DIAGNOSIS — I110 Hypertensive heart disease with heart failure: Secondary | ICD-10-CM | POA: Insufficient documentation

## 2019-07-13 DIAGNOSIS — I4819 Other persistent atrial fibrillation: Secondary | ICD-10-CM

## 2019-07-13 DIAGNOSIS — I5032 Chronic diastolic (congestive) heart failure: Secondary | ICD-10-CM | POA: Insufficient documentation

## 2019-07-13 DIAGNOSIS — I480 Paroxysmal atrial fibrillation: Secondary | ICD-10-CM | POA: Insufficient documentation

## 2019-07-13 DIAGNOSIS — Z79899 Other long term (current) drug therapy: Secondary | ICD-10-CM | POA: Diagnosis not present

## 2019-07-13 DIAGNOSIS — I493 Ventricular premature depolarization: Secondary | ICD-10-CM | POA: Diagnosis not present

## 2019-07-13 DIAGNOSIS — D6869 Other thrombophilia: Secondary | ICD-10-CM

## 2019-07-13 DIAGNOSIS — M1990 Unspecified osteoarthritis, unspecified site: Secondary | ICD-10-CM | POA: Insufficient documentation

## 2019-07-13 DIAGNOSIS — J449 Chronic obstructive pulmonary disease, unspecified: Secondary | ICD-10-CM | POA: Insufficient documentation

## 2019-07-13 DIAGNOSIS — Z7901 Long term (current) use of anticoagulants: Secondary | ICD-10-CM | POA: Diagnosis not present

## 2019-07-13 DIAGNOSIS — I2510 Atherosclerotic heart disease of native coronary artery without angina pectoris: Secondary | ICD-10-CM | POA: Diagnosis not present

## 2019-07-13 DIAGNOSIS — Z8249 Family history of ischemic heart disease and other diseases of the circulatory system: Secondary | ICD-10-CM | POA: Diagnosis not present

## 2019-07-13 DIAGNOSIS — J849 Interstitial pulmonary disease, unspecified: Secondary | ICD-10-CM | POA: Diagnosis not present

## 2019-07-13 DIAGNOSIS — Z8701 Personal history of pneumonia (recurrent): Secondary | ICD-10-CM | POA: Insufficient documentation

## 2019-07-13 DIAGNOSIS — Z7951 Long term (current) use of inhaled steroids: Secondary | ICD-10-CM | POA: Insufficient documentation

## 2019-07-13 DIAGNOSIS — Z87891 Personal history of nicotine dependence: Secondary | ICD-10-CM | POA: Insufficient documentation

## 2019-07-13 LAB — BASIC METABOLIC PANEL
Anion gap: 10 (ref 5–15)
BUN: 24 mg/dL — ABNORMAL HIGH (ref 8–23)
CO2: 25 mmol/L (ref 22–32)
Calcium: 9.2 mg/dL (ref 8.9–10.3)
Chloride: 103 mmol/L (ref 98–111)
Creatinine, Ser: 1.06 mg/dL — ABNORMAL HIGH (ref 0.44–1.00)
GFR calc Af Amer: 53 mL/min — ABNORMAL LOW (ref >60)
GFR calc non Af Amer: 46 mL/min — ABNORMAL LOW (ref >60)
Glucose, Bld: 91 mg/dL (ref 70–99)
Potassium: 4.4 mmol/L (ref 3.5–5.1)
Sodium: 138 mmol/L (ref 135–145)

## 2019-07-13 LAB — CBC
HCT: 38.7 % (ref 36.0–46.0)
Hemoglobin: 12.1 g/dL (ref 12.0–15.0)
MCH: 28.1 pg (ref 26.0–34.0)
MCHC: 31.3 g/dL (ref 30.0–36.0)
MCV: 89.8 fL (ref 80.0–100.0)
Platelets: 296 10*3/uL (ref 150–400)
RBC: 4.31 MIL/uL (ref 3.87–5.11)
RDW: 14.6 % (ref 11.5–15.5)
WBC: 10 10*3/uL (ref 4.0–10.5)
nRBC: 0 % (ref 0.0–0.2)

## 2019-07-13 LAB — MAGNESIUM: Magnesium: 2.1 mg/dL (ref 1.7–2.4)

## 2019-07-13 MED ORDER — DOFETILIDE 125 MCG PO CAPS: 125.0000 ug | ORAL_CAPSULE | Freq: Two times a day (BID) | ORAL | 3 refills | Status: DC

## 2019-07-13 NOTE — Progress Notes (Signed)
Primary Care Physician: [name], MD Primary Cardiologist: Dr [name] Primary Electrophysiologist: Dr Curt Bears Referring Physician: Dr Remo Lipps Crystal Kerr is a 86 y.o. 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  The patient was initially diagnosed with atrial fibrillation in 2015 in the setting of viral gastroenteritis and had TEE/DCCV at that time. She is on Eliquis for a CHADS2VASC score of 5. Patient had been maintained on amiodarone and underwent cardioversions in January, June, and September of 2020. She is followed by pulmonology for her ILD. She denies any significant snoring or alcohol use. Patient reports that she was in her usual state of health until the week of 06/19/19 when she started having symptoms of fatigue and SOB. She also noted increased lower extremity edema.    On follow up today, patient is s/p dofetilide loading 6/21-6/24/21. She had DCCV on 07/05/19 but unfortunately reverted back to afib prior to leaving the hospital. She reports that she has continued to have dyspnea with exertion although she does feel better today. She remains in rate controlled afib. She denies bleeding issues with anticoagulation.   Today, she denies symptoms of palpitations, chest pain, orthopnea, PND, dizziness, presyncope, syncope, snoring, daytime somnolence, bleeding, or neurologic sequela. The patient is tolerating medications without difficulties and is otherwise without complaint today.    Atrial Fibrillation Risk Factors:  she does not have symptoms or diagnosis of sleep apnea. she does not have a history of rheumatic fever. she does not have a history of alcohol use. The patient does not have a history of early familial atrial fibrillation or other arrhythmias.  she has a BMI of Body mass index is 28.27  kg/m.Marland Kitchen Filed Weights   07/13/19 1108  Weight: 72.4 kg    Family History  Problem Relation Age of Onset   Heart failure Mother    Diabetes Father    Heart disease Other        No family history     Atrial Fibrillation Management history:  Previous antiarrhythmic drugs: amiodarone, dofetilide  Previous cardioversions: 2015, 01/2018, 06/2018, 09/2018 Previous ablations: none CHADS2VASC score: 5 Anticoagulation history: Eliquis   Past Medical History:  Diagnosis Date   Arthritis    Asthma    Atrial fibrillation ([place])    a. s/p DCCV in 2015, on Amiodarone and Eliquis   CHF (congestive heart failure) (HCC)    COPD (chronic obstructive pulmonary disease) ([place])    Dysrhythmia    afib -hx    Neuromuscular disorder ([place])    peripheral neuropathy    Pneumonia    hx of x 2    PVC's (premature ventricular contractions)    Past Surgical History:  Procedure Laterality Date   APPENDECTOMY     CARDIOVERSION N/A 03/08/2013   Procedure: CARDIOVERSION;  Surgeon: Larey Dresser, MD;  Location: [facility];  Service: Cardiovascular;  Laterality: N/A;   CARDIOVERSION N/A 07/05/2019   Procedure: CARDIOVERSION;  Surgeon: Sueanne Margarita, MD;  Location: [facility];  Service: Cardiovascular;  Laterality: N/A;   CHOLECYSTECTOMY     oophrectomy     TEE WITHOUT CARDIOVERSION N/A 03/08/2013   Procedure: TRANSESOPHAGEAL ECHOCARDIOGRAM (TEE);  Surgeon: Larey Dresser, MD;  Location: [facility];  Service: Cardiovascular;  Laterality: N/A;   TONSILLECTOMY     TRANSURETHRAL RESECTION OF BLADDER TUMOR WITH GYRUS (TURBT-GYRUS)  05/13/2016  urology wiht [facility]   TRANSURETHRAL RESECTION OF BLADDER TUMOR WITH MITOMYCIN-C N/A 12/24/2017   Procedure: TRANSURETHRAL RESECTION OF BLADDER TUMOR;  Surgeon: Lucas Mallow, MD;  Location: [facility];  Service: Urology;  Laterality: N/A;    Current Outpatient Medications  Medication Sig Dispense Refill   acetaminophen (TYLENOL) 325 MG  tablet Take 2 tablets (650 mg total) by mouth every 6 (six) hours as needed for mild pain or headache. 30 tablet 0   albuterol (PROAIR HFA) 108 (90 Base) MCG/ACT inhaler Inhale 2 puffs into the lungs every 4 (four) hours as needed for wheezing or shortness of breath. 18 g 11   apixaban (ELIQUIS) 5 MG TABS tablet Take 1 tablet (5 mg total) by mouth 2 (two) times daily. 180 tablet 3   chlorpheniramine (CHLOR-TRIMETON) 4 MG tablet Take 4 mg by mouth 2 (two) times daily as needed for allergies.     Diclofenac Sodium (VOLTAREN EX) Apply 1 application topically 2 (two) times daily as needed (pain).      diltiazem (TIAZAC) 120 MG 24 hr capsule TAKE 1 CAPSULE BY MOUTH DAILY 90 capsule 3   dofetilide (TIKOSYN) 125 MCG capsule Take 1 capsule (125 mcg total) by mouth 2 (two) times daily. 60 capsule 3   fluticasone furoate-vilanterol (BREO ELLIPTA) 200-25 MCG/INH AEPB Inhale 1 puff into the lungs daily. Brush teeth or rinse and gargle after use. 60 each 11   furosemide (LASIX) 20 MG tablet Take 1 tablet (20 mg total) by mouth daily. Use as needed for swelling 90 tablet 3   MAGNESIUM-OXIDE 400 (241.3 Mg) MG tablet Take 1 tablet by mouth at bedtime.      montelukast (SINGULAIR) 10 MG tablet Take 1 tablet (10 mg total) by mouth daily. 90 tablet 3   Multiple Vitamins-Minerals (MULTIVITAMIN WITH MINERALS) tablet Take 1 tablet by mouth daily.     MYRBETRIQ 25 MG TB24 tablet TAKE 1 TABLET BY MOUTH DAILY FOR BLADDER SYMPTOMS 30 tablet 6   rOPINIRole (REQUIP) 0.5 MG tablet Start with 1/2 tablet at bedtime.  May increase to a whole tablet after 3 days if needed for restless legs 30 tablet 6   traMADol (ULTRAM) 50 MG tablet Take 1 tablet (50 mg total) by mouth every 12 (twelve) hours as needed (pain). 60 tablet 0   No current facility-administered medications for this encounter.    No Known Allergies  Social History   Socioeconomic History   Marital status: Divorced    Spouse name: Not on file    Number of children: Not on file   Years of education: Not on file   Highest education level: Not on file  Occupational History   Occupation: Retired    Fish farm manager: RETIRED  Tobacco Use   Smoking status: Former Smoker    Packs/day: 1.00    Years: 20.00    Pack years: 20.00    Types: Cigarettes    Quit date: 04/13/1981    Years since quitting: 38.2   Smokeless tobacco: Never Used  Vaping Use   Vaping Use: Never used  Substance and Sexual Activity   Alcohol use: Not Currently    Comment: Occasional   Drug use: No   Sexual activity: Never  Other Topics Concern   Not on file  Social History Narrative   Not on file   Social Determinants of Health   Financial Resource Strain: Low Risk    Difficulty of Paying Living Expenses: Not very hard  Food Insecurity:    Worried  About Running Out of Food in the Last Year:    Ran Out of Food in the Last Year:   Transportation Needs:    Film/video editor (Medical):    Lack of Transportation (Non-Medical):   Physical Activity:    Days of Exercise per Week:    Minutes of Exercise per Session:   Stress:    Feeling of Stress :   Social Connections:    Frequency of Communication with Friends and Family:    Frequency of Social Gatherings with Friends and Family:    Attends Religious Services:    Active Member of Clubs or Organizations:    Attends Music therapist:    Marital Status:   Intimate Partner Violence:    Fear of Current or Ex-Partner:    Emotionally Abused:    Physically Abused:    Sexually Abused:      ROS- All systems are reviewed and negative except as per the HPI above.  Physical Exam: Vitals:   07/13/19 1108  BP: (!) 142/70  Pulse: 94  Weight: 72.4 kg  Height: 5\' 3"  (1.6 m)    GEN- The patient is well appearing elderly female, alert and oriented x 3 today.   HEENT-head normocephalic, atraumatic, sclera clear, conjunctiva pink, hearing intact, trachea midline. Lungs-  Clear to ausculation bilaterally, normal work of breathing Heart- irregular rate and rhythm, no murmurs, rubs or gallops  GI- soft, NT, ND, + BS Extremities- no clubbing, cyanosis, or edema MS- no significant deformity or atrophy Skin- no rash or lesion Psych- euthymic mood, full affect Neuro- strength and sensation are intact   Wt Readings from Last 3 Encounters:  07/13/19 72.4 kg  07/04/19 71.4 kg  07/03/19 72.3 kg    EKG today demonstrates afib HR 94, QRS 72, QTc 472  Echo 2//20 demonstrated   1. The left ventricle has hyperdynamic systolic function of >09%. The cavity size is normal. There is no left ventricular wall thickness. Echo evidence of impaired relaxation diastolic filling patterns.  2. Mildly dilated left atrial size.  3. Normal right atrial size.  4. Trivial pericardial effusion, as described above.  5. Normal tricuspid valve.  6. The aortic valve tricuspid. There is moderate thickening and moderate sclerosis of the aortic valve.  7. No atrial level shunt detected by color flow Doppler.  Epic records are reviewed at length today  Assessment and Plan:  1. Persistent atrial fibrillation Amiodarone discontinued 2/2 concern for lung toxicity.  S/p dofetilide loading 6/21-6/24/21. DCCV 07/05/19. Unfortunately, she remains in rate controlled afib.  We discussed therapeutic options including repeat DCCV. Patient would like to continue present therapy for now and if she remains persistently in afib, she will consider repeat DCCV. Continue dofetilide 125 mcg BID Check bmet/mag/CBC Continue Eliquis 5 mg BID Continue diltiazem 120 mg daily  This patients CHA2DS2-VASc Score and unadjusted Ischemic Stroke Rate (% per year) is equal to 7.2 % stroke rate/year from a score of 5  Above score calculated as 1 point each if present [CHF, HTN, DM, Vascular=MI/PAD/Aortic Plaque, Age if 65-74, or Female] Above score calculated as 2 points each if present [Age > 75, or  Stroke/TIA/TE]  2. HTN Stable, no changes today.   3. CAD Three vessel coronary calcification seen on CT. No anginal symptoms.  4. Chronic diastolic CHF Weight stable. Patient taking Lasix only PRN, has not had to use recently.    Follow up in the AF clinic in one week.    Adline Peals  PA-C Afib [facility] [address] [place], [place] [address] [phone] 07/13/2019 11:49 AM

## 2019-07-21 ENCOUNTER — Ambulatory Visit (HOSPITAL_COMMUNITY)
Admission: RE | Admit: 2019-07-21 | Discharge: 2019-07-21 | Disposition: A | Discharge status: Home / Self Care | Payer: Medicare Other | Source: Ambulatory Visit | Attending: Physician Assistant | Admitting: Physician Assistant

## 2019-07-21 ENCOUNTER — Other Ambulatory Visit: Payer: Self-pay

## 2019-07-21 VITALS — BP 110/60 | HR 107

## 2019-07-21 DIAGNOSIS — I4819 Other persistent atrial fibrillation: Secondary | ICD-10-CM

## 2019-07-21 DIAGNOSIS — I4891 Unspecified atrial fibrillation: Secondary | ICD-10-CM | POA: Diagnosis not present

## 2019-07-21 MED ORDER — DOFETILIDE 125 MCG PO CAPS: 125.0000 ug | ORAL_CAPSULE | Freq: Two times a day (BID) | ORAL | 3 refills | Status: DC

## 2019-07-21 NOTE — Progress Notes (Signed)
Patient returns today for repeat ECG. ECG today shows afib HR 107, QRS 72, QTc 507. We has a long discussion about goals of her care and rate vs rhythm control. Patient is clear that at this time she does not want to consider repeat DCCV. She states she is adjusting to being in afib and feels reasonably well. For now, will continue with dofetilide but if she remains in afib on follow up, will likely stop this as she is not getting any benefit. Follow up with Dr Curt Bears as scheduled.

## 2019-08-15 ENCOUNTER — Ambulatory Visit (INDEPENDENT_AMBULATORY_CARE_PROVIDER_SITE_OTHER): Payer: Medicare Other | Admitting: Cardiology

## 2019-08-15 ENCOUNTER — Encounter: Payer: Self-pay | Admitting: Cardiology

## 2019-08-15 ENCOUNTER — Other Ambulatory Visit: Payer: Self-pay

## 2019-08-15 VITALS — BP 110/62 | HR 88 | Ht 63.0 in | Wt 159.0 lb

## 2019-08-15 DIAGNOSIS — I4819 Other persistent atrial fibrillation: Secondary | ICD-10-CM | POA: Diagnosis not present

## 2019-08-15 DIAGNOSIS — I5032 Chronic diastolic (congestive) heart failure: Secondary | ICD-10-CM | POA: Diagnosis not present

## 2019-08-15 DIAGNOSIS — I10 Essential (primary) hypertension: Secondary | ICD-10-CM | POA: Insufficient documentation

## 2019-08-15 NOTE — Patient Instructions (Addendum)
Medication Instructions:  Your physician has recommended you make the following change in your medication:   1.  STOP dofetilide (Tikosyn)  2.  STOP magnesium  Labwork: None ordered.  Testing/Procedures: None ordered.  Follow-Up: Your physician wants you to follow-up in: as needed with Dr. Curt Bears.   Any Other Special Instructions Will Be Listed Below (If Applicable).  If you need a refill on your cardiac medications before your next appointment, please call your pharmacy.

## 2019-08-15 NOTE — Progress Notes (Signed)
Electrophysiology Office Note   Date:  08/15/2019   ID:  [name], [name] [birth year], MRN [id number]  PCP:  [name], MD  Cardiologist:  [name] Primary Electrophysiologist:  Hayleigh Bawa Meredith Leeds, MD    Chief Complaint: AF   History of Present Illness: Crystal Kerr is a 86 y.o. female who is being seen today for the evaluation of AF at the request of Copland, Gay Filler, MD. Presenting today for electrophysiology evaluation.  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  She was diagnosed with atrial fibrillation in 2015.  She is currently on Eliquis.  She has had multiple cardioversions over the past few years.  She was initially sustained on amiodarone, but she developed ILD, followed by pulmonary medicine.  She was admitted for dofetilide loading 07/03/2019.  She had a cardioversion in the hospital but unfortunately reverted back to atrial fibrillation.  Today, she denies symptoms of palpitations, chest pain, shortness of breath, orthopnea, PND, lower extremity edema, claudication, dizziness, presyncope, syncope, bleeding, or neurologic sequela. The patient is tolerating medications without difficulties.  She tells me that she feels well today.  She does not have much in the way of shortness of breath or fatigue.  At this point, she is tired of going through multiple medications and cardioversions and wishes to stay in atrial fibrillation as she is not feeling poorly.   Past Medical History:  Diagnosis Date  . Arthritis   . Asthma   . Atrial fibrillation ([place])    a. s/p DCCV in 2015, on Amiodarone and Eliquis  . CHF (congestive heart failure) ([place])   . COPD (chronic obstructive pulmonary disease) ([place])   . Dysrhythmia    afib -hx   . Neuromuscular disorder ([place])    peripheral neuropathy   . Pneumonia    hx of x 2   . PVC's (premature ventricular contractions)     Past Surgical History:  Procedure Laterality Date  . APPENDECTOMY    . CARDIOVERSION N/A 03/08/2013   Procedure: CARDIOVERSION;  Surgeon: Larey Dresser, MD;  Location: [facility];  Service: Cardiovascular;  Laterality: N/A;  . CARDIOVERSION N/A 07/05/2019   Procedure: CARDIOVERSION;  Surgeon: Sueanne Margarita, MD;  Location: [facility];  Service: Cardiovascular;  Laterality: N/A;  . CHOLECYSTECTOMY    . oophrectomy    . TEE WITHOUT CARDIOVERSION N/A 03/08/2013   Procedure: TRANSESOPHAGEAL ECHOCARDIOGRAM (TEE);  Surgeon: Larey Dresser, MD;  Location: [facility];  Service: Cardiovascular;  Laterality: N/A;  . TONSILLECTOMY    . TRANSURETHRAL RESECTION OF BLADDER TUMOR WITH GYRUS (TURBT-GYRUS)  05/13/2016   urology wiht [facility]  . TRANSURETHRAL RESECTION OF BLADDER TUMOR WITH MITOMYCIN-C N/A 12/24/2017   Procedure: TRANSURETHRAL RESECTION OF BLADDER TUMOR;  Surgeon: Lucas Mallow, MD;  [facility];  Service: Urology;  Laterality: N/A;     Current Outpatient Medications  Medication Sig Dispense Refill  . acetaminophen (TYLENOL) 325 MG tablet Take 2 tablets (650 mg total) by mouth every 6 (six) hours as needed for mild pain or headache. 30 tablet 0  . albuterol (PROAIR HFA) 108 (90 Base) MCG/ACT inhaler Inhale 2 puffs into the lungs every 4 (four) hours as needed for wheezing or shortness of breath. 18 g 11  . apixaban (ELIQUIS) 5 MG TABS tablet Take 1 tablet (5 mg total) by mouth 2 (two) times daily. 180 tablet 3  . chlorpheniramine (CHLOR-TRIMETON) 4 MG tablet Take  4 mg by mouth 2 (two) times daily as needed for allergies.    . Diclofenac Sodium (VOLTAREN EX) Apply 1 application topically 2 (two) times daily as needed (pain).     Marland Kitchen diltiazem (TIAZAC) 120 MG 24 hr capsule TAKE 1 CAPSULE BY MOUTH DAILY 90 capsule 3  . fluticasone furoate-vilanterol (BREO ELLIPTA) 200-25 MCG/INH AEPB Inhale 1 puff into the lungs daily. Brush teeth or rinse and gargle after use. 60 each 11  .  furosemide (LASIX) 20 MG tablet Take 1 tablet (20 mg total) by mouth daily. Use as needed for swelling 90 tablet 3  . montelukast (SINGULAIR) 10 MG tablet Take 1 tablet (10 mg total) by mouth daily. 90 tablet 3  . Multiple Vitamins-Minerals (MULTIVITAMIN WITH MINERALS) tablet Take 1 tablet by mouth daily.    Marland Kitchen MYRBETRIQ 25 MG TB24 tablet TAKE 1 TABLET BY MOUTH DAILY FOR BLADDER SYMPTOMS 30 tablet 6  . rOPINIRole (REQUIP) 0.5 MG tablet Start with 1/2 tablet at bedtime.  May increase to a whole tablet after 3 days if needed for restless legs 30 tablet 6  . traMADol (ULTRAM) 50 MG tablet Take 1 tablet (50 mg total) by mouth every 12 (twelve) hours as needed (pain). 60 tablet 0   No current facility-administered medications for this visit.    Allergies:   Patient has no known allergies.   Social History:  The patient  reports that she quit smoking about 38 years ago. Her smoking use included cigarettes. She has a 20.00 pack-year smoking history. She has never used smokeless tobacco. She reports previous alcohol use. She reports that she does not use drugs.   Family History:  The patient's family history includes Diabetes in her father; Heart disease in an other family member; Heart failure in her mother.    ROS:  Please see the history of present illness.   Otherwise, review of systems is positive for none.   All other systems are reviewed and negative.    PHYSICAL EXAM: VS:  BP 110/62   Pulse 88   Ht 5\' 3"  (1.6 m)   Wt 159 lb (72.1 kg)   SpO2 97%   BMI 28.17 kg/m  , BMI Body mass index is 28.17 kg/m. GEN: Well nourished, well developed, in no acute distress  HEENT: normal  Neck: no JVD, carotid bruits, or masses Cardiac: irregular; no murmurs, rubs, or gallops,no edema  Respiratory:  clear to auscultation bilaterally, normal work of breathing GI: soft, nontender, nondistended, + BS MS: no deformity or atrophy  Skin: warm and dry Neuro:  Strength and sensation are intact Psych:  euthymic mood, full affect  EKG:  EKG is ordered today. Personal review of the ekg ordered shows atrial fibrillation, rate 88  Recent Labs: 09/30/2018: TSH 1.388 07/13/2019: BUN 24; Creatinine, Ser 1.06; Hemoglobin 12.1; Magnesium 2.1; Platelets 296; Potassium 4.4; Sodium 138    Lipid Panel     Component Value Date/Time   CHOL 228 (H) 04/22/2011 0000   TRIG 157 (H) 04/22/2011 0000   HDL 50 04/22/2011 0000   CHOLHDL 4.6 04/22/2011 0000   VLDL 31 04/22/2011 0000   LDLCALC 147 (H) 04/22/2011 0000     Wt Readings from Last 3 Encounters:  08/15/19 159 lb (72.1 kg)  07/13/19 159 lb 9.6 oz (72.4 kg)  07/04/19 157 lb 6.4 oz (71.4 kg)      Other studies Reviewed: Additional studies/ records that were reviewed today include: TTE 02/14/18  Review of the above records today  demonstrates:  1. The left ventricle has hyperdynamic systolic function of >16%. The  cavity size is normal. There is no left ventricular wall thickness. Echo  evidence of impaired relaxation diastolic filling patterns.  2. Mildly dilated left atrial size.  3. Normal right atrial size.  4. Trivial pericardial effusion, as described above.  5. Normal tricuspid valve.  6. The aortic valve tricuspid. There is moderate thickening and moderate  sclerosis of the aortic valve.  7. No atrial level shunt detected by color flow Doppler.    ASSESSMENT AND PLAN:  1.  Persistent atrial fibrillation: Not able to take amiodarone due to lung toxicity.  Is currently on dofetilide and Eliquis with a CHA2DS2-VASc of 5.  She is currently in atrial fibrillation and has not been well controlled on her dofetilide.  She is required cardioversions since starting the medication.  She is happ and feels well in atrial fibrillation today.  Due to that, we Kofi Murrell plan for a rate control strategy.  We Merrilyn Legler let her be followed by her primary cardiologist and if there are any further rhythm issues I would be happy to see her again.  2.   Hypertension: Currently well controlled  3.  Chronic diastolic heart failure: No obvious volume overload  Case discussed with primary cardiology  Current medicines are reviewed at length with the patient today.   The patient does not have concerns regarding her medicines.  The following changes were made today:.Dofetilide, magnesium  Labs/ tests ordered today include:  Orders Placed This Encounter  Procedures  . EKG 12-Lead     Disposition:   FU with Ghazal Pevey as needed  Signed, Morgen Ritacco Meredith Leeds, MD  08/15/2019 2:37 PM     [facility] [address] [place] [address] [phone] (office) [phone] (fax)

## 2019-08-18 ENCOUNTER — Ambulatory Visit: Payer: Medicare Other | Admitting: Student

## 2019-10-02 ENCOUNTER — Telehealth: Payer: Self-pay | Admitting: Pharmacist

## 2019-10-02 NOTE — Progress Notes (Signed)
Chronic Care Management Pharmacy Assistant   Name: Crystal Kerr  MRN: [id number] DOB: Feb 28, 1924  Reason for Encounter: General Enhancement Call  Patient Questions:  1.  Have you seen any other providers since your last visit?   2.  Any changes in your medicines or health?   PCP : [name], MD  Their chronic conditions include: AFib, Asthma, Heart Failure, Overactive Bladder, Restless Leg Syndrome, Pain  Consults: 08-15-19 (Cardiology) Patient is presented in the office with [name] for the evaluation of AF at the request of Copland, Gay Filler, MD. Presenting today for electrophysiology evaluation.  Patient denied symptoms of palpitations, chest pain, shortness of breath, orthopnea, PND, lower extremity edema, claudication, dizziness, presyncope, syncope, bleeding, or neurologic sequela. Provider stated patient is tolerating medications without difficulties.  Patient reported that she feels well today.  She does not have much in the way of shortness of breath or fatigue.  At this point, she is tired of going through multiple medications and cardioversions and wishes to stay in atrial fibrillation as she is not feeling poorly. Provider recommended you make the following change in your medication: STOP dofetilide (Tikosyn) and  STOP magnesium 07-13-19 (Cardiology) Patient is presented in the office with [name] for s/p dofetilide loading 6/21-6/24/21. Provider stated patient had DCCV on 07/05/19 but unfortunately reverted back to afib prior to leaving the hospital. Patient reported that she has continued to have dyspnea with exertion although she does feel better today. Provider states patient remains in rate controlled afib. Patient denied bleeding issues with anticoagulation.  Stable dofetilide labs, no change. 07-03-19 (Cardiology)  Patient is presented in the office with [name] for follow up in the [facility] for dofetilide  admission.  Patient reported doing a little better since her last visit with less edema. Provider states patient remains in afib. Patient denied any missed doses of anticoagulation in the last 3 weeks. Provider states amiodarone discontinued 2/2 concern for lung toxicity. Patient reported agreeable to dofetilide admission. Amio discontinued >3 months ago. Patient states she has checked on the price of the medication and reports it is affordable.  Provider states PharmD has screened medications and no contraindicated medications on board. Patient reported not taking chlorpheniramine this AM. Provider counseled patient to change to 2nd gen antihistamines moving forward.  QTc in SR 429-442 ms.  Provider reported labs today show creatinine at 1.09, K+ 4.3 and mag 2.1, CrCl calculated at 37 mL/min.  Provider recommended continue Eliquis 5 mg BID and continue diltiazem 120 mg daily 06-27-19 (Cardiology)  Patient is presented in the office with [name] for follow up in the [facility].  The patient was initially diagnosed with atrial fibrillation in 2015 in the setting of viral gastroenteritis and had TEE/DCCV at that time. Provider states patient is on Eliquis for a CHADS2VASC score of 5. Provider states patient had been maintained on amiodarone and underwent cardioversions in January, June, and September of 2020. Patient states she is followed by pulmonology for her ILD. Patient denies any significant snoring or alcohol use.  On follow up, patient reported that she was in her usual state of health until the week of 06/19/19 when she started having symptoms of fatigue and SOB. Patient also noted increased lower extremity edema. No orthopnea or PND provider stated. ECG today show patient is in rate controlled afib. Patient stated she has checked on the price of dofetilide and agreeable for admission.  Hospitalizations: 07-03-19 [facility]) Patient is presented in admission at  hospital They were referred to EP in the outpatient setting for treatment options of atrial fibrillation.  Risks, benefits, and alternatives to Tikosyn were reviewed with the patient who wished to proceed.  The patient was admitted and Tikosyn was initiated.  Renal function and electrolytes were followed during the hospitalization.  Their QTc remained stable.  On 07/05/2019 they underwent direct current cardioversion which restored sinus rhythm.  They were monitored until discharge on telemetry which demonstrated that she unfortunately reverted back to atrial fibrillation shortly after DCCV, with controlled rates.  On the day of discharge, they were examined by Dr. Curt Bears  who considered them stable for discharge to home with close AF clinic follow up to consider further DCCV.  EKG prior to discharged showed atrial fibrillation at 98 bpm with QTc ~475-480 ms (Stable for patient this admission).  No Office Visits since last CCM visit on 06-05-19.  Allergies:  No Known Allergies  Medications: Outpatient Encounter Medications as of 10/02/2019  Medication Sig   acetaminophen (TYLENOL) 325 MG tablet Take 2 tablets (650 mg total) by mouth every 6 (six) hours as needed for mild pain or headache.   albuterol (PROAIR HFA) 108 (90 Base) MCG/ACT inhaler Inhale 2 puffs into the lungs every 4 (four) hours as needed for wheezing or shortness of breath.   apixaban (ELIQUIS) 5 MG TABS tablet Take 1 tablet (5 mg total) by mouth 2 (two) times daily.   chlorpheniramine (CHLOR-TRIMETON) 4 MG tablet Take 4 mg by mouth 2 (two) times daily as needed for allergies.   Diclofenac Sodium (VOLTAREN EX) Apply 1 application topically 2 (two) times daily as needed (pain).    diltiazem (TIAZAC) 120 MG 24 hr capsule TAKE 1 CAPSULE BY MOUTH DAILY   fluticasone furoate-vilanterol (BREO ELLIPTA) 200-25 MCG/INH AEPB Inhale 1 puff into the lungs daily. Brush teeth or rinse and gargle after use.   furosemide (LASIX) 20 MG tablet  Take 1 tablet (20 mg total) by mouth daily. Use as needed for swelling   montelukast (SINGULAIR) 10 MG tablet Take 1 tablet (10 mg total) by mouth daily.   Multiple Vitamins-Minerals (MULTIVITAMIN WITH MINERALS) tablet Take 1 tablet by mouth daily.   MYRBETRIQ 25 MG TB24 tablet TAKE 1 TABLET BY MOUTH DAILY FOR BLADDER SYMPTOMS   rOPINIRole (REQUIP) 0.5 MG tablet Start with 1/2 tablet at bedtime.  May increase to a whole tablet after 3 days if needed for restless legs   traMADol (ULTRAM) 50 MG tablet Take 1 tablet (50 mg total) by mouth every 12 (twelve) hours as needed (pain).   No facility-administered encounter medications on file as of 10/02/2019.    Current Diagnosis: Patient Active Problem List   Diagnosis Date Noted   Hypertension 08/15/2019   Secondary hypercoagulable state ([place]) 12/20/2018   Bladder cancer ([place]) 07/26/2018   Renal insufficiency 02/10/2018   Chronic diastolic heart failure ([place]) 11/23/2017   ILD (interstitial lung disease) ([place]) 11/23/2017   Current use of long term anticoagulation 07/31/2016   Atypical pneumonia 06/02/2016   History of atrial fibrillation 06/02/2016   Lower extremity edema 12/19/2013   Deafness or hearing loss of type classifiable to 389.0 with type classifiable to 389.1 10/27/2013   Benign paroxysmal positional vertigo 09/13/2013   Upper airway cough syndrome 07/10/2013   Allergic rhinitis 07/05/2013   Persistent atrial fibrillation ([place]) 03/30/2013   Shortness of breath 02/06/2013   Acute on chronic diastolic congestive heart failure ([place])  02/04/2013   Arthritis of spine 07/01/2012   Intrinsic asthma 07/01/2012   Dyspnea 04/12/2012   PVC's (premature ventricular contractions) 04/24/2011   Murmur, cardiac 04/24/2011    Goals Addressed   None      Adherence Review:  Does the patient have >5 day gap between last estimated fill date for maintenance inhaler medications? No CPP Please Review   Chronic Care  Management   Outreach Note  10/05/2019 Name: AMANPREET DELMONT MRN: [id number] DOB: 01-18-1924  Referred by: [name], MD Reason for referral : Chronic Care Management   Third unsuccessful telephone outreach was attempted today. The patient was referred to the pharmacist for assistance with care management and care coordination.    Follow-Up:  Pharmacist Review   [place] Shannon, [place] [facility] [place] Pharmacist Assistant [phone]

## 2019-10-12 ENCOUNTER — Telehealth: Payer: Self-pay | Admitting: Pharmacist

## 2019-10-12 DIAGNOSIS — R310 Gross hematuria: Secondary | ICD-10-CM | POA: Diagnosis not present

## 2019-10-12 DIAGNOSIS — C678 Malignant neoplasm of overlapping sites of bladder: Secondary | ICD-10-CM | POA: Diagnosis not present

## 2019-10-12 NOTE — Progress Notes (Addendum)
Verified Adherence Gap Information. Per insurance data, the patient has not met the following:   Screening for clinical depression   AWV  Patient not prescribed diabetic medication or cholesterol medication. KPN data through 09-12-19.  [place] Shannon, [place] [facility] [place] Pharmacist Assistant [phone]  Reviewed by: De Blanch, PharmD Clinical Pharmacist [facility] [phone]

## 2019-11-14 DIAGNOSIS — R310 Gross hematuria: Secondary | ICD-10-CM | POA: Diagnosis not present

## 2019-11-14 DIAGNOSIS — C678 Malignant neoplasm of overlapping sites of bladder: Secondary | ICD-10-CM | POA: Diagnosis not present

## 2019-11-28 NOTE — Progress Notes (Signed)
[facility] [place], [place], [place] [address] [phone]  Date:  11/29/2019   Name:  Crystal Kerr   DOB:  02-Feb-1924   MRN:  [id number]  PCP:  [name], MD    Chief Complaint: Leg Pain (2 weeks ago, left lower leg pain, bruising, hot to touch)   History of Present Illness:  Crystal Kerr is a 86 y.o. very pleasant female patient who presents with the following:  Elderly patient here today with concern of a possible problem with her leg Last seen by myself in March of this year   Crystal Kerr has history of atrial fibrillation, interstitial lung disease/asthma, renal insufficiency, history of bladder cancer, chronic diastolic CHF, hypertension, bladder cancer Her cardiologist is Dr. Stann Ore saw him about 1 month ago He recommended that she continue Cardizem and Eliquis She is no longer taking amiodarone as there was some question of this causing lung toxicity.  They also tried Tikosyn but it did not work  She saw Dr Curt Bears in August of this year  For now the plan is rate control and anticoagulation  About 10 days ago she noted a spontaneous bruise on LEFT right lateral leg.  It seemed to come up during the night while she was asleep.  She is not aware of any injury.  She has no pain in this area.  Her left hip does hurt chronically with arthritis, no fall or other recent injury to the hip No other unusual bleeding or bruising noted-however she has noted the bruise over her left leg seems to be traveling down her shin as the days go on.  She otherwise feels well, no malaise or fever  Flu vaccine today Needs her covid booster-she plans to do this as soon as possible Patient Active Problem List   Diagnosis Date Noted  . Hypertension 08/15/2019  . Secondary hypercoagulable state ([place]) 12/20/2018  . Bladder cancer ([place]) 07/26/2018  . Renal insufficiency 02/10/2018  . Chronic  diastolic heart failure ([place]) 11/23/2017  . ILD (interstitial lung disease) ([place]) 11/23/2017  . Current use of long term anticoagulation 07/31/2016  . Atypical pneumonia 06/02/2016  . History of atrial fibrillation 06/02/2016  . Lower extremity edema 12/19/2013  . 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 Benign paroxysmal positional vertigo 09/13/2013  . Upper airway cough syndrome 07/10/2013  . Allergic rhinitis 07/05/2013  . Persistent atrial fibrillation ([place]) 03/30/2013  . Shortness of breath 02/06/2013  . Acute on chronic diastolic congestive heart failure ([place]) 02/04/2013  . Arthritis of spine 07/01/2012  . Intrinsic asthma 07/01/2012  . Dyspnea 04/12/2012  . PVC's (premature ventricular contractions) 04/24/2011  . Murmur, cardiac 04/24/2011    Past Medical History:  Diagnosis Date  . Arthritis   . Asthma   . Atrial fibrillation ([employer])    a. s/p DCCV in 2015, on Amiodarone and Eliquis  . CHF (congestive heart failure) ([place])   . COPD (chronic obstructive pulmonary disease) ()   . Dysrhythmia    afib -hx   . Neuromuscular disorder ([place])    peripheral neuropathy   . Pneumonia    hx of x 2   . PVC's (premature ventricular contractions)     Past Surgical History:  Procedure Laterality Date  . APPENDECTOMY    . CARDIOVERSION N/A 03/08/2013   Procedure: CARDIOVERSION;  Surgeon: Larey Dresser, MD;  Location:  [facility];  Service: Cardiovascular;  Laterality: N/A;  . CARDIOVERSION N/A 07/05/2019   Procedure: CARDIOVERSION;  Surgeon: Sueanne Margarita, MD;  Location: [facility];  Service: Cardiovascular;  Laterality: N/A;  . CHOLECYSTECTOMY    . oophrectomy    . TEE WITHOUT CARDIOVERSION N/A 03/08/2013   Procedure: TRANSESOPHAGEAL ECHOCARDIOGRAM (TEE);  Surgeon: Larey Dresser, MD;  Location: [facility];  Service: Cardiovascular;  Laterality: N/A;  . TONSILLECTOMY    . TRANSURETHRAL RESECTION OF BLADDER  TUMOR WITH GYRUS (TURBT-GYRUS)  05/13/2016   urology wiht [facility]  . TRANSURETHRAL RESECTION OF BLADDER TUMOR WITH MITOMYCIN-C N/A 12/24/2017   Procedure: TRANSURETHRAL RESECTION OF BLADDER TUMOR;  Surgeon: Lucas Mallow, MD;  Location: [facility];  Service: Urology;  Laterality: N/A;    Social History   Tobacco Use  . Smoking status: Former Smoker    Packs/day: 1.00    Years: 20.00    Pack years: 20.00    Types: Cigarettes    Quit date: 04/13/1981    Years since quitting: 38.6  . Smokeless tobacco: Never Used  Vaping Use  . Vaping Use: Never used  Substance Use Topics  . Alcohol use: Not Currently    Comment: Occasional  . Drug use: No    Family History  Problem Relation Age of Onset  . Heart failure Mother   . Diabetes Father   . Heart disease Other        No family history    No Known Allergies  Medication list has been reviewed and updated.  Current Outpatient Medications on File Prior to Visit  Medication Sig Dispense Refill  . acetaminophen (TYLENOL) 325 MG tablet Take 2 tablets (650 mg total) by mouth every 6 (six) hours as needed for mild pain or headache. 30 tablet 0  . albuterol (PROAIR HFA) 108 (90 Base) MCG/ACT inhaler Inhale 2 puffs into the lungs every 4 (four) hours as needed for wheezing or shortness of breath. 18 g 11  . apixaban (ELIQUIS) 5 MG TABS tablet Take 1 tablet (5 mg total) by mouth 2 (two) times daily. 180 tablet 3  . chlorpheniramine (CHLOR-TRIMETON) 4 MG tablet Take 4 mg by mouth 2 (two) times daily as needed for allergies.    . Diclofenac Sodium (VOLTAREN EX) Apply 1 application topically 2 (two) times daily as needed (pain).     Marland Kitchen diltiazem (TIAZAC) 120 MG 24 hr capsule TAKE 1 CAPSULE BY MOUTH DAILY 90 capsule 3  . fluticasone furoate-vilanterol (BREO ELLIPTA) 200-25 MCG/INH AEPB Inhale 1 puff into the lungs daily. Brush teeth or rinse and gargle after use. 60 each 11  . furosemide (LASIX) 20 MG tablet Take 1 tablet (20 mg total) by mouth daily.  Use as needed for swelling 90 tablet 3  . montelukast (SINGULAIR) 10 MG tablet Take 1 tablet (10 mg total) by mouth daily. 90 tablet 3  . Multiple Vitamins-Minerals (MULTIVITAMIN WITH MINERALS) tablet Take 1 tablet by mouth daily.    Marland Kitchen MYRBETRIQ 25 MG TB24 tablet TAKE 1 TABLET BY MOUTH DAILY FOR BLADDER SYMPTOMS 30 tablet 6  . traMADol (ULTRAM) 50 MG tablet Take 1 tablet (50 mg total) by mouth every 12 (twelve) hours as needed (pain). 60 tablet 0   No current facility-administered medications on file prior to visit.    Review of Systems:  As per HPI- otherwise negative.   Physical Examination: Vitals:   11/29/19 1401 11/29/19 1419  BP: 122/82   Pulse: (!) 110 90  Resp: 16  SpO2: 97%    Vitals:   11/29/19 1401  Weight: 162 lb (73.5 kg)  Height: 5\' 3"  (1.6 m)   Body mass index is 28.7 kg/m. Ideal Body Weight: Weight in (lb) to have BMI = 25: 140.8  GEN: no acute distress.  Elderly woman sitting in wheelchair HEENT: Atraumatic, Normocephalic.  Ears and Nose: No external deformity. CV: Rate controlled atrial fib, No M/G/R. No JVD. No thrill. No extra heart sounds. PULM: CTA B, no wheezes, crackles, rhonchi. No retractions. No resp. distress. No accessory muscle use. EXTR: No c/c/e PSYCH: Normally interactive. Conversant.  She has a bruise on her left lateral shin, starts just below the knee and has drained down towards the ankle bone.  No current hematoma, no break in the skin.  No redness or heat, no suggestion of infection The foot is normal, trace edema present.  Normal pulses The calf is soft and nontender  Assessment and Plan: Needs flu shot - Plan: Flu Vaccine QUAD High Dose(Fluad)  Restless legs - Plan: rOPINIRole (REQUIP) 0.5 MG tablet  Hematoma of left lower leg  Flu vaccine today Rosy rarely uses Requip for restless legs.  Needs a refill of this today Discussed hematoma of her left leg.  This occurred due to some minimal trauma, exacerbated by blood  thinner.  Appears to be resolving normally, no suggestion of infection or complication at this time Discussed with patient and her daughter, they feel reassured.  We will continue to monitor this area and let me know if it does not improve gradually This visit occurred during the SARS-CoV-2 public health emergency.  Safety protocols were in place, including screening questions prior to the visit, additional usage of staff PPE, and extensive cleaning of exam room while observing appropriate contact time as indicated for disinfecting solutions.    Signed Lamar Blinks, MD

## 2019-11-29 ENCOUNTER — Ambulatory Visit (INDEPENDENT_AMBULATORY_CARE_PROVIDER_SITE_OTHER): Payer: Medicare Other | Admitting: Family Medicine

## 2019-11-29 ENCOUNTER — Other Ambulatory Visit: Payer: Self-pay

## 2019-11-29 ENCOUNTER — Encounter: Payer: Self-pay | Admitting: Family Medicine

## 2019-11-29 VITALS — BP 122/82 | HR 90 | Resp 16 | Ht 63.0 in | Wt 162.0 lb

## 2019-11-29 DIAGNOSIS — G2581 Restless legs syndrome: Secondary | ICD-10-CM

## 2019-11-29 DIAGNOSIS — S8012XA Contusion of left lower leg, initial encounter: Secondary | ICD-10-CM | POA: Diagnosis not present

## 2019-11-29 DIAGNOSIS — Z23 Encounter for immunization: Secondary | ICD-10-CM

## 2019-11-29 MED ORDER — ROPINIROLE HCL 0.5 MG PO TABS: ORAL_TABLET | ORAL | 6 refills | Status: DC

## 2019-11-29 NOTE — Patient Instructions (Signed)
Good to see you again today- flu vaccine today I think your bruise was caused by eliquis - it does not appear to be anything dangerous and will probably clear up gradually over the next 2-3 weeks.  Let me know if painful or any other concerns  Please see me in 2 months or so for a routine visit

## 2019-12-05 ENCOUNTER — Ambulatory Visit: Payer: Medicare Other | Admitting: Pharmacist

## 2019-12-05 NOTE — Chronic Care Management (AMB) (Signed)
Chronic Care Management Pharmacy  Name: Crystal Kerr  MRN: [id number] DOB: 01/27/1924  Chief Complaint/ HPI  Crystal Kerr,  86 y.o. , female presents for their Follow-Up CCM visit with the clinical pharmacist via telephone due to COVID-19 Pandemic.  PCP : [name], MD  Their chronic conditions include: AFib, Asthma, Heart Failure, Overactive Bladder, Restless Leg Syndrome, Pain  Office Visits: 11/29/19: Visit w/ [name] - Flu shot given. Refill of ropinirole. Bruise on leg resolving, but precautions to notify if no improvement.  Consult Visit: 08/15/19: Cardio visit w/ Dr. Curt Bears - At this point, she is tired of going through multiple medications and cardioversions and wishes to stay in atrial fibrillation as she is not feeling poorly. D/C dofetilide and magnesium   Medications: Outpatient Encounter Medications as of 12/05/2019  Medication Sig   acetaminophen (TYLENOL) 325 MG tablet Take 2 tablets (650 mg total) by mouth every 6 (six) hours as needed for mild pain or headache.   albuterol (PROAIR HFA) 108 (90 Base) MCG/ACT inhaler Inhale 2 puffs into the lungs every 4 (four) hours as needed for wheezing or shortness of breath.   apixaban (ELIQUIS) 5 MG TABS tablet Take 1 tablet (5 mg total) by mouth 2 (two) times daily.   chlorpheniramine (CHLOR-TRIMETON) 4 MG tablet Take 4 mg by mouth 2 (two) times daily as needed for allergies.   Diclofenac Sodium (VOLTAREN EX) Apply 1 application topically 2 (two) times daily as needed (pain).    diltiazem (TIAZAC) 120 MG 24 hr capsule TAKE 1 CAPSULE BY MOUTH DAILY   fluticasone furoate-vilanterol (BREO ELLIPTA) 200-25 MCG/INH AEPB Inhale 1 puff into the lungs daily. Brush teeth or rinse and gargle after use.   furosemide (LASIX) 20 MG tablet Take 1 tablet (20 mg total) by mouth daily. Use as needed for swelling   montelukast (SINGULAIR) 10 MG tablet Take 1 tablet (10 mg total) by mouth daily.    Multiple Vitamins-Minerals (MULTIVITAMIN WITH MINERALS) tablet Take 1 tablet by mouth daily.   MYRBETRIQ 25 MG TB24 tablet TAKE 1 TABLET BY MOUTH DAILY FOR BLADDER SYMPTOMS   rOPINIRole (REQUIP) 0.5 MG tablet Start with 1/2 tablet at bedtime.  May increase to a whole tablet after 3 days if needed for restless legs   traMADol (ULTRAM) 50 MG tablet Take 1 tablet (50 mg total) by mouth every 12 (twelve) hours as needed (pain).   No facility-administered encounter medications on file as of 12/05/2019.   SDOH Screenings   Alcohol Screen:    Last Alcohol Screening Score (AUDIT): Not on file  Depression (PHQ2-9): Low Risk    PHQ-2 Score: 4  Financial Resource Strain: Low Risk    Difficulty of Paying Living Expenses: Not very hard  Food Insecurity:    Worried About Charity fundraiser in the Last Year: Not on file   [employer] of Food in the Last Year: Not on file  Housing:    Last Housing Risk Score: Not on file  Physical Activity:    Days of Exercise per Week: Not on file   Minutes of Exercise per Session: Not on file  Social Connections:    Frequency of Communication with Friends and Family: Not on file   Frequency of Social Gatherings with Friends and Family: Not on file   Attends Religious Services: Not on file   Active Member of Clubs or Organizations: Not on file   Attends Archivist Meetings: Not on file   Marital  Status: Not on file  Stress:    Feeling of Stress : Not on file  Tobacco Use: Medium Risk   Smoking Tobacco Use: Former Smoker   Smokeless Tobacco Use: Never Used  Transportation Needs:    Film/video editor (Medical): Not on file   Lack of Transportation (Non-Medical): Not on file     Current Diagnosis/Assessment:  Goals Addressed            This Visit's Progress    Chronic Care Mangement Pharmacy Care Plan       CARE PLAN ENTRY  Current Barriers:   Chronic Disease Management support, education, and care coordination  needs related to AFib, Asthma, Heart Failure, Overactive Bladder, Restless Leg Syndrome, Pain  Asthma  Pharmacist Clinical Goal(s) o Over the next 180 days, patient will work with PharmD and providers to reduce symptoms of asthma  Current regimen:  o Breo 200-93mcg/inh 2 puffs once daily o Montelukast 10mg  daily o Albuterol inhaler as needed  Interventions: o Discussed possibility of using albuterol 5-20 minutes before exerting activity to help reduce symptoms of shortness of breath  Patient self care activities - Over the next 180 days, patient will: o Maintain asthma medication regimen  Afib   Pharmacist Clinical Goal(s) o Over the next 180 days, patient will work with PharmD and providers to reduce symptoms or complication of Afib  Current regimen:  o Diltiazem 120mg  daily o Eliquis 5mg  twice daily  Interventions: o Discussed the indication of diltiazem in setting of Afib  Patient self care activities - Over the next 180 days, patient will: o Maintain Afib medication regimen.  Medication management  Pharmacist Clinical Goal(s): o Over the next 180 days, patient will work with PharmD and providers to maintain optimal medication adherence  Current pharmacy: [facility]  Interventions o Comprehensive medication review performed. o Continue current medication management strategy  Patient self care activities - Over the next 180 days, patient will: o Focus on medication adherence by filling medications appropriately  o Take medications as prescribed o Report any questions or concerns to PharmD and/or provider(s)  Please see past updates related to this goal by clicking on the "Past Updates" button in the selected goal        Social Hx:  Originally from [place].  Sibling moved to [place], so the rest moved here.  She has 3 children.  Lives with her daughter [name]. [name] is primary care giver. She wishes her siblings would help more, but the pandemic has allowed her  to work from home.  Patient manages her own medications with pill box that she fills out weekly. Patient is hard of hearing (has 2 hearing aids   AFIB   Patient is currently rate controlled.  Patient has failed these meds in past: amiodarone (D/C 12/20/18 d/t concern for lung dx) Patient is currently controlled on the following medications:   Diltiazem 120mg  daily  Eliquis 5mg  twice daily  Followed by Cardio and Afib clinic. No symptoms of Afib. Usual symptoms (feeling tired or "not feeling right") Last experience of Afib was about a month ago. She rested and it resolved.   Update 12/05/19 Symptoms at baseline. Decreased stamina, increased fatigue.   Plan -Continue current medications   Asthma / Tobacco   Last spirometry score: 03/19/2014 FEV1: 1.17 (78%) FVC: 1.58 (77%) FEV1/FVC: 74%  Eosinophil count:   Lab Results  Component Value Date/Time   EOSPCT 0 07/08/2018 02:19 PM  %  Eos (Absolute):  Lab Results  Component Value Date/Time   EOSABS 0.0 07/08/2018 02:19 PM   EOSABS 0.3 03/03/2017 10:15 AM    Tobacco Status:  Social History   Tobacco Use  Smoking Status Former Smoker   Packs/Krina Mraz: 1.00   Years: 20.00   Pack years: 20.00   Types: Cigarettes   Quit date: 04/13/1981   Years since quitting: 38.6  Smokeless Tobacco Never Used    Patient has failed these meds in past: Dulera (changed to [place] due to insurance coverage) Patient is currently controlled on the following medications:   Breo 200-84mcg/inh 2 puffs once daily  Montelukast 10mg  daily  Albuterol inhaler as needed Using maintenance inhaler regularly? Yes Frequency of rescue inhaler use:  infrequently (once every 2 months)  Patient feels [employer] works better than [employer] She gets SOB with exertion.   We discussed:  Possibility of used prophlyactic albuterol with exerting activities    Update 12/05/19 Still taking daily  Plan -Continue current medications    -Consider using albuterol 5-20 minutes before exerting activities to reduce incidence of SOB  Heart Failure   Type: Diastolic  Last ejection fraction: 02/14/2018 >65% NYHA Class: I (no actitivty limitation) AHA HF Stage: B (Heart disease present - no symptoms present)  Patient has failed these meds in past: None noted  Patient is currently controlled on the following medications:   Furosemide 20mg  daily prn swelling  Furosemide: Once every 3 months   No edema present. When she has swelling she elevates her legs rather than taking lasix.   Update 12/05/19 Ocassionally. "She hates lasix"  Plan -Continue current medications  Overactive Bladder    Patient has failed these meds in past: None noted  Patient is currently controlled on the following medications:   Myrbetriq 25mg  daily   Only uses mybetriq once or twice per week as needed. Pt likes to use the least amount of medication as possible.  She wonders if this medication can cause constipation. We discussed that there is a possibility of it causing constipation (1-3%)  We discussed:  How the most efficacy is seen with daily use vs as needed use with this medication. Noting to balance risk/benefit with bladder control vs constipation  Update 12/05/19 Uses 3 days a week. Realizes she should take daily, but she rather take PRN  Plan -Consider using mybetriq daily for maximum efficiacy -Continue current medications   Restless Leg Syndrome    Patient has failed these meds in past: tizanidine, methocarbamol? Patient is currently controlled on the following medications:   Ropinirole 0.5mg  1/2 -1 tab daily at bedtime  Uses about once a month. Pt likes to use the least amount of medication as possible.  She uses 1 whole tab for 3 days then she doesn't need it for another month She wonders if this medication can cause constipation. We discussed that there is a possibility of it causing constipation (5% with extended  release formulation; pt takes immediate release, but there is still a possibility)  Update 12/05/19 Rarely uses  Plan -Continue current medications   Miscellaneous Meds  Multivitamin  De Blanch, PharmD Clinical Pharmacist [facility] [phone]

## 2019-12-06 NOTE — Patient Instructions (Signed)
Visit Information  Goals Addressed            This Visit's Progress   . Chronic Care Mangement Pharmacy Care Plan       CARE PLAN ENTRY  Current Barriers:  . Chronic Disease Management support, education, and care coordination needs related to AFib, Asthma, Heart Failure, Overactive Bladder, Restless Leg Syndrome, Pain  Asthma . Pharmacist Clinical Goal(s) o Over the next 180 days, patient will work with PharmD and providers to reduce symptoms of asthma . Current regimen:  o Breo 200-60mcg/inh 2 puffs once daily o Montelukast 10mg  daily o Albuterol inhaler as needed . Interventions: o Discussed possibility of using albuterol 5-20 minutes before exerting activity to help reduce symptoms of shortness of breath . Patient self care activities - Over the next 180 days, patient will: o Maintain asthma medication regimen  Afib  . Pharmacist Clinical Goal(s) o Over the next 180 days, patient will work with PharmD and providers to reduce symptoms or complication of Afib . Current regimen:  o Diltiazem 120mg  daily o Eliquis 5mg  twice daily . Interventions: o Discussed the indication of diltiazem in setting of Afib . Patient self care activities - Over the next 180 days, patient will: o Maintain Afib medication regimen.  Medication management . Pharmacist Clinical Goal(s): o Over the next 180 days, patient will work with PharmD and providers to maintain optimal medication adherence . Current pharmacy: [facility] . Interventions o Comprehensive medication review performed. o Continue current medication management strategy . Patient self care activities - Over the next 180 days, patient will: o Focus on medication adherence by filling medications appropriately  o Take medications as prescribed o Report any questions or concerns to PharmD and/or provider(s)  Please see past updates related to this goal by clicking on the "Past Updates" button in the selected goal         The  patient verbalized understanding of instructions, educational materials, and care plan provided today and declined offer to receive copy of patient instructions, educational materials, and care plan.   Telephone follow up appointment with pharmacy team member scheduled for: 06/04/2020  Melvenia Beam Ceilidh Torregrossa, PharmD Clinical Pharmacist [facility] [phone]

## 2019-12-28 ENCOUNTER — Other Ambulatory Visit: Payer: Self-pay

## 2019-12-28 ENCOUNTER — Ambulatory Visit (INDEPENDENT_AMBULATORY_CARE_PROVIDER_SITE_OTHER): Payer: Medicare Other | Admitting: Cardiology

## 2019-12-28 ENCOUNTER — Encounter: Payer: Self-pay | Admitting: Cardiology

## 2019-12-28 VITALS — BP 136/62 | HR 80 | Ht 63.0 in | Wt 159.8 lb

## 2019-12-28 DIAGNOSIS — J849 Interstitial pulmonary disease, unspecified: Secondary | ICD-10-CM

## 2019-12-28 DIAGNOSIS — I5032 Chronic diastolic (congestive) heart failure: Secondary | ICD-10-CM

## 2019-12-28 DIAGNOSIS — Z8679 Personal history of other diseases of the circulatory system: Secondary | ICD-10-CM

## 2019-12-28 DIAGNOSIS — Z7901 Long term (current) use of anticoagulants: Secondary | ICD-10-CM | POA: Diagnosis not present

## 2019-12-28 DIAGNOSIS — C679 Malignant neoplasm of bladder, unspecified: Secondary | ICD-10-CM

## 2019-12-28 NOTE — Assessment & Plan Note (Signed)
CHADS VASC= 4, on Eliquis 5mg  BID (based on weight and renal function)

## 2019-12-28 NOTE — Assessment & Plan Note (Signed)
Echo done during Feb 2020 admission- normal LVF, normal wall thickness, mild LAE. No CHF on exam

## 2019-12-28 NOTE — Assessment & Plan Note (Signed)
PAF first diagnosed in Feb 2015.   She was previously on Amiodarone which was stopped secondary to interstitial lung disease.  Tikosyn loading and DCCV in June 2021 but she reverted to AF- plan is for rate control.

## 2019-12-28 NOTE — Progress Notes (Signed)
Cardiology Office Note:    Date:  12/28/2019   ID:  Crystal Kerr, DOB 1924/12/24, MRN [id number]  PCP:  Crystal Mclean, MD  Cardiologist:  Crystal Ruths, MD  Electrophysiologist:  None   Referring MD: Crystal Mclean, MD   No chief complaint on file. none  History of Present Illness:    Crystal Kerr is a delightful 86 y.o. female with a history of PAF. This was first diagnosedin 2015 in the setting of a viralpneumonia. She was placed on amiodarone and Eliquis at that time. Amiodarone was later discontinued for possible interstitial lung disease. [facility]She carries a diagnosis of presumptive diastolic heart failure.  She has DOE when in AF and in June 2021 she was cardioverted on Tikosyn but failed to hold NSR and Tikosyn was discontinued.  The plan is for rate control.   Patient is in the office today for routine follow-up.  She is accompanied by her daughter.  The patient tells me that she is "relieved".  She realizes she does not have to fight to try and feel better anymore.  She has accepted the fact that she will remain in AF.  She has fatigue with exertion but otherwise has no complaints.  Past Medical History:  Diagnosis Date  . Arthritis   . Asthma   . Atrial fibrillation (Crystal Kerr)    a. s/p DCCV in 2015, on Amiodarone and Eliquis  . CHF (congestive heart failure) ([place])   . COPD (chronic obstructive pulmonary disease) ([place])   . Dysrhythmia    afib -hx   . Neuromuscular disorder ([place])    peripheral neuropathy   . Pneumonia    hx of x 2   . PVC's (premature ventricular contractions)     Past Surgical History:  Procedure Laterality Date  . APPENDECTOMY    . CARDIOVERSION N/A 03/08/2013   Procedure: CARDIOVERSION;  Surgeon: Crystal Dresser, MD;  Location: [place];  Service: Cardiovascular;  Laterality: N/A;  . CARDIOVERSION N/A 07/05/2019   Procedure: CARDIOVERSION;  Surgeon: Crystal Margarita, MD;   Location: [facility] ENDOSCOPY;  Service: Cardiovascular;  Laterality: N/A;  . CHOLECYSTECTOMY    . oophrectomy    . TEE WITHOUT CARDIOVERSION N/A 03/08/2013   Procedure: TRANSESOPHAGEAL ECHOCARDIOGRAM (TEE);  Surgeon: Crystal Dresser, MD;  Location: [facility];  Service: Cardiovascular;  Laterality: N/A;  . TONSILLECTOMY    . TRANSURETHRAL RESECTION OF BLADDER TUMOR WITH GYRUS (TURBT-GYRUS)  05/13/2016   urology wiht [facility]  . TRANSURETHRAL RESECTION OF BLADDER TUMOR WITH MITOMYCIN-C N/A 12/24/2017   Procedure: TRANSURETHRAL RESECTION OF BLADDER TUMOR;  Surgeon: Crystal Mallow, MD;  Location: [facility];  Service: Urology;  Laterality: N/A;    Current Medications: Current Meds  Medication Sig  . acetaminophen (TYLENOL) 325 MG tablet Take 2 tablets (650 mg total) by mouth every 6 (six) hours as needed for mild pain or headache.  . albuterol (PROAIR HFA) 108 (90 Base) MCG/ACT inhaler Inhale 2 puffs into the lungs every 4 (four) hours as needed for wheezing or shortness of breath.  Marland Kitchen apixaban (ELIQUIS) 5 MG TABS tablet Take 1 tablet (5 mg total) by mouth 2 (two) times daily.  . chlorpheniramine (CHLOR-TRIMETON) 4 MG tablet Take 4 mg by mouth 2 (two) times daily as needed for allergies.  . Diclofenac Sodium (VOLTAREN EX) Apply 1 application topically 2 (two) times daily as needed (pain).   Marland Kitchen diltiazem (TIAZAC) 120 MG 24 hr capsule TAKE 1 CAPSULE BY  MOUTH DAILY  . fluticasone furoate-vilanterol (BREO ELLIPTA) 200-25 MCG/INH AEPB Inhale 1 puff into the lungs daily. Brush teeth or rinse and gargle after use.  . furosemide (LASIX) 20 MG tablet Take 1 tablet (20 mg total) by mouth daily. Use as needed for swelling  . montelukast (SINGULAIR) 10 MG tablet Take 1 tablet (10 mg total) by mouth daily.  . Multiple Vitamins-Minerals (MULTIVITAMIN WITH MINERALS) tablet Take 1 tablet by mouth daily.  Marland Kitchen MYRBETRIQ 25 MG TB24 tablet TAKE 1 TABLET BY MOUTH DAILY FOR BLADDER SYMPTOMS  . traMADol (ULTRAM) 50 MG tablet Take  1 tablet (50 mg total) by mouth every 12 (twelve) hours as needed (pain).     Allergies:   Patient has no known allergies.   Social History   Socioeconomic History  . Marital status: Divorced    Spouse name: Not on file  . Number of children: Not on file  . Years of education: Not on file  . Highest education level: Not on file  Occupational History  . Occupation: Retired    Fish farm manager: RETIRED  Tobacco Use  . Smoking status: Former Smoker    Packs/day: 1.00    Years: 20.00    Pack years: 20.00    Types: Cigarettes    Quit date: 04/13/1981    Years since quitting: 38.7  . Smokeless tobacco: Never Used  Vaping Use  . Vaping Use: Never used  Substance and Sexual Activity  . Alcohol use: Not Currently    Comment: Occasional  . Drug use: No  . Sexual activity: Never  Other Topics Concern  . Not on file  Social History Narrative  . Not on file   Social Determinants of Health   Financial Resource Strain: Low Risk   . Difficulty of Paying Living Expenses: Not very hard  Food Insecurity: Not on file  Transportation Needs: Not on file  Physical Activity: Not on file  Stress: Not on file  Social Connections: Not on file     Family History: The patient's family history includes Diabetes in her father; Heart disease in an other family member; Heart failure in her mother.  ROS:   Please see the history of present illness.     All other systems reviewed and are negative.  EKGs/Labs/Other Studies Reviewed:    The following studies were reviewed today: Echo 02/14/2018-  Study Result  TRANSTHORACIC ECHOCARDIOGRAM REPORT      Patient Name:  Crystal Kerr Date of Exam: 02/14/2018  Medical Rec #: [id number]       Height:    63.0 in  Accession #:  [id number]       Weight:    174.4 lb  Date of Birth: October 11, 1924       BSA:     1.82 m  Patient Age:  69 years        BP:      166/54 mmHg  Patient Gender: F            HR:      77 bpm.  Exam Location: Inpatient     Procedure: 2D Echo   Indications:  Dyspnea 786.09 / R06.00    History:    Patient has prior history of Echocardiogram examinations.  CHF,         COPD, Atrial Fibrillation; Risk Factors: Former Smoker.  PVC's.    Sonographer:  Crystal Sicilian, M  Referring Phys: 66 NAYANA ABROL   IMPRESSIONS    1. The left ventricle has hyperdynamic  systolic function of >06%. The  cavity size is normal. There is no left ventricular wall thickness. Echo  evidence of impaired relaxation diastolic filling patterns.  2. Mildly dilated left atrial size.  3. Normal right atrial size.  4. Trivial pericardial effusion, as described above.  5. Normal tricuspid valve.  6. The aortic valve tricuspid. There is moderate thickening and moderate  sclerosis of the aortic valve.  7. No atrial level shunt detected by color flow Doppler.      EKG:  EKG is ordered today.  The ekg ordered today demonstrates AF with VR 103, poor anterior RW, LAD, irregular baseline  Recent Labs: 07/13/2019: BUN 24; Creatinine, Ser 1.06; Hemoglobin 12.1; Magnesium 2.1; Platelets 296; Potassium 4.4; Sodium 138  Recent Lipid Panel    Component Value Date/Time   CHOL 228 (H) 04/22/2011 0000   TRIG 157 (H) 04/22/2011 0000   HDL 50 04/22/2011 0000   CHOLHDL 4.6 04/22/2011 0000   VLDL 31 04/22/2011 0000   LDLCALC 147 (H) 04/22/2011 0000    Physical Exam:    VS:  BP 136/62   Pulse 80   Ht 5\' 3"  (1.6 m)   Wt 159 lb 12.8 oz (72.5 kg)   SpO2 96%   BMI 28.31 kg/m     Wt Readings from Last 3 Encounters:  12/28/19 159 lb 12.8 oz (72.5 kg)  11/29/19 162 lb (73.5 kg)  08/15/19 159 lb (72.1 kg)     GEN: Elderly Caucasian female in wheelchair, well developed in no acute distress HEENT: Normal NECK: No JVD CARDIAC: irregularly irregular, no murmurs, rubs, gallops RESPIRATORY:  Clear with faint scatter crackles ABDOMEN: Soft, non-tender,  non-distended MUSCULOSKELETAL:  No edema; No deformity  SKIN: Warm and dry NEUROLOGIC:  Alert and oriented x 3 PSYCHIATRIC:  Normal affect   ASSESSMENT:    History of atrial fibrillation PAF first diagnosed in Feb 2015.   She was previously on Amiodarone which was stopped secondary to interstitial lung disease.  Tikosyn loading and DCCV in June 2021 but she reverted to AF- plan is for rate control.  Current use of long term anticoagulation CHADS VASC= 4, on Eliquis 5mg  BID (based on weight and renal function)  Chronic diastolic heart failure ([place]) Echo done during Feb 2020 admission- normal LVF, normal wall thickness, mild LAE. No CHF on exam  ILD (interstitial lung disease) ([place]) Followed by  Pulmonary- Amiodarone stopped previously  PLAN:    Same Rx- f/u with Dr [name] in 6 months   Medication Adjustments/Labs and Tests Ordered: Current medicines are reviewed at length with the patient today.  Concerns regarding medicines are outlined above.  No orders of the defined types were placed in this encounter.  No orders of the defined types were placed in this encounter.   Patient Instructions  Medication Instructions:  Continue current medications  *If you need a refill on your cardiac medications before your next appointment, please call your pharmacy*   Lab Work: None Ordered   Testing/Procedures: None Ordered   Follow-Up: At [facility], you and your health needs are our priority.  As part of our continuing mission to provide you with exceptional heart care, we have created designated Provider Care Teams.  These Care Teams include your primary Cardiologist (physician) and Advanced Practice Providers (APPs -  Physician Assistants and Nurse Practitioners) who all work together to provide you with the care you need, when you need it.  We recommend signing up for the patient portal called "MyChart".  Sign up information is provided on this After Visit  Summary.  MyChart is used to connect with patients for Virtual Visits (Telemedicine).  Patients are able to view lab/test results, encounter notes, upcoming appointments, etc.  Non-urgent messages can be sent to your provider as well.   To learn more about what you can do with MyChart, go to [email or web address].    Your next appointment:   6 month(s)  The format for your next appointment:   In Person  Provider:   You may see Crystal Ruths, MD or one of the following Advanced Practice Providers on your designated Care Team:    Kerin Ransom, PA-C  [place], [place]  Coletta Memos, FNP       Signed, Kerin Ransom, [place]  12/28/2019 8:48 AM    [place]

## 2019-12-28 NOTE — Patient Instructions (Signed)

## 2019-12-28 NOTE — Assessment & Plan Note (Signed)
Followed by [facility]- Amiodarone stopped previously

## 2020-01-01 NOTE — Addendum Note (Signed)
Addended by: Zebedee Iba on: 01/01/2020 09:32 AM   Modules accepted: Orders

## 2020-01-04 ENCOUNTER — Other Ambulatory Visit: Payer: Self-pay | Admitting: Family Medicine

## 2020-01-04 DIAGNOSIS — R350 Frequency of micturition: Secondary | ICD-10-CM

## 2020-01-13 ENCOUNTER — Other Ambulatory Visit: Payer: Self-pay | Admitting: Family Medicine

## 2020-02-17 IMAGING — DX DG CHEST 2V
2 series · 2 of 2 positions shown · non-contrast
Comparison: 11/09/2017. 03/03/2017.  CT 08/17/2017.

CLINICAL DATA: Shortness of breath.

EXAM:
CHEST - 2 VIEW

[chest pa]
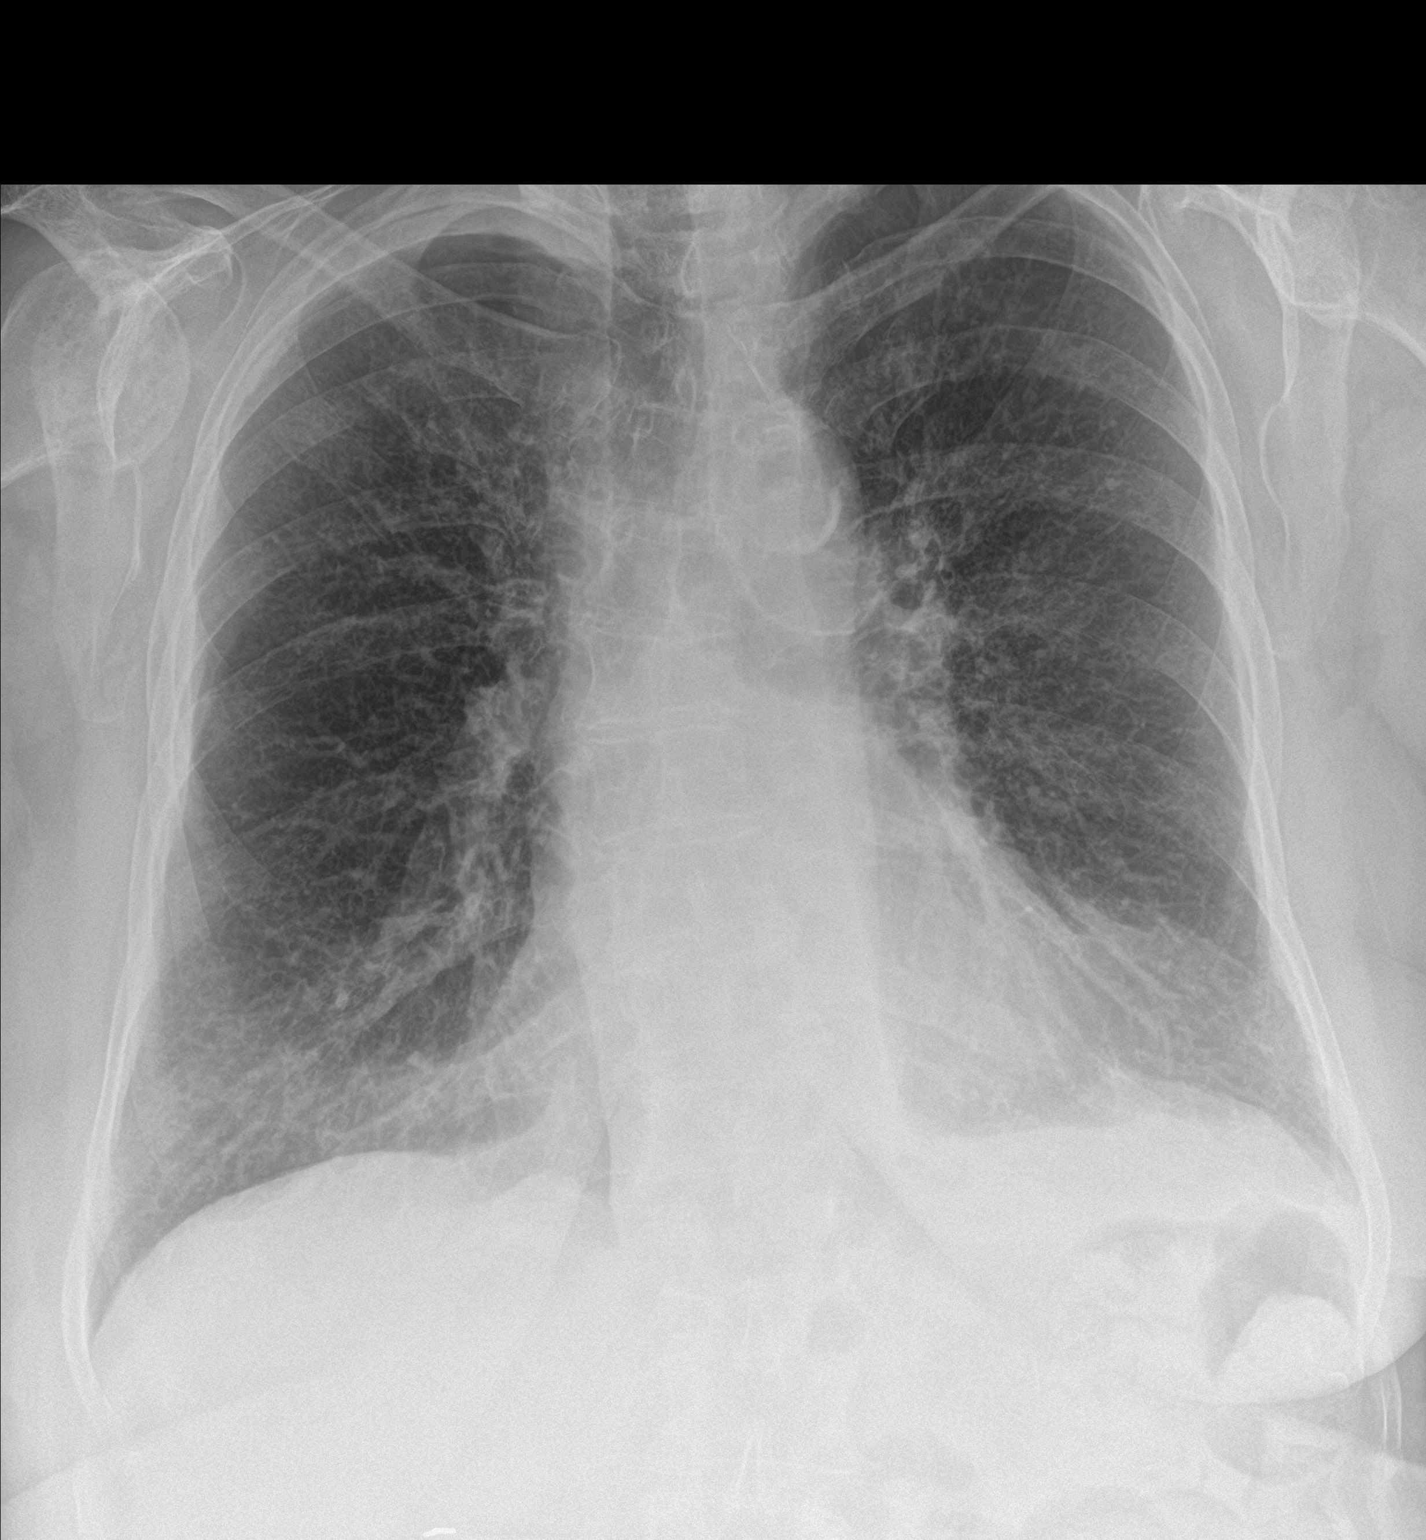

[chest lat]
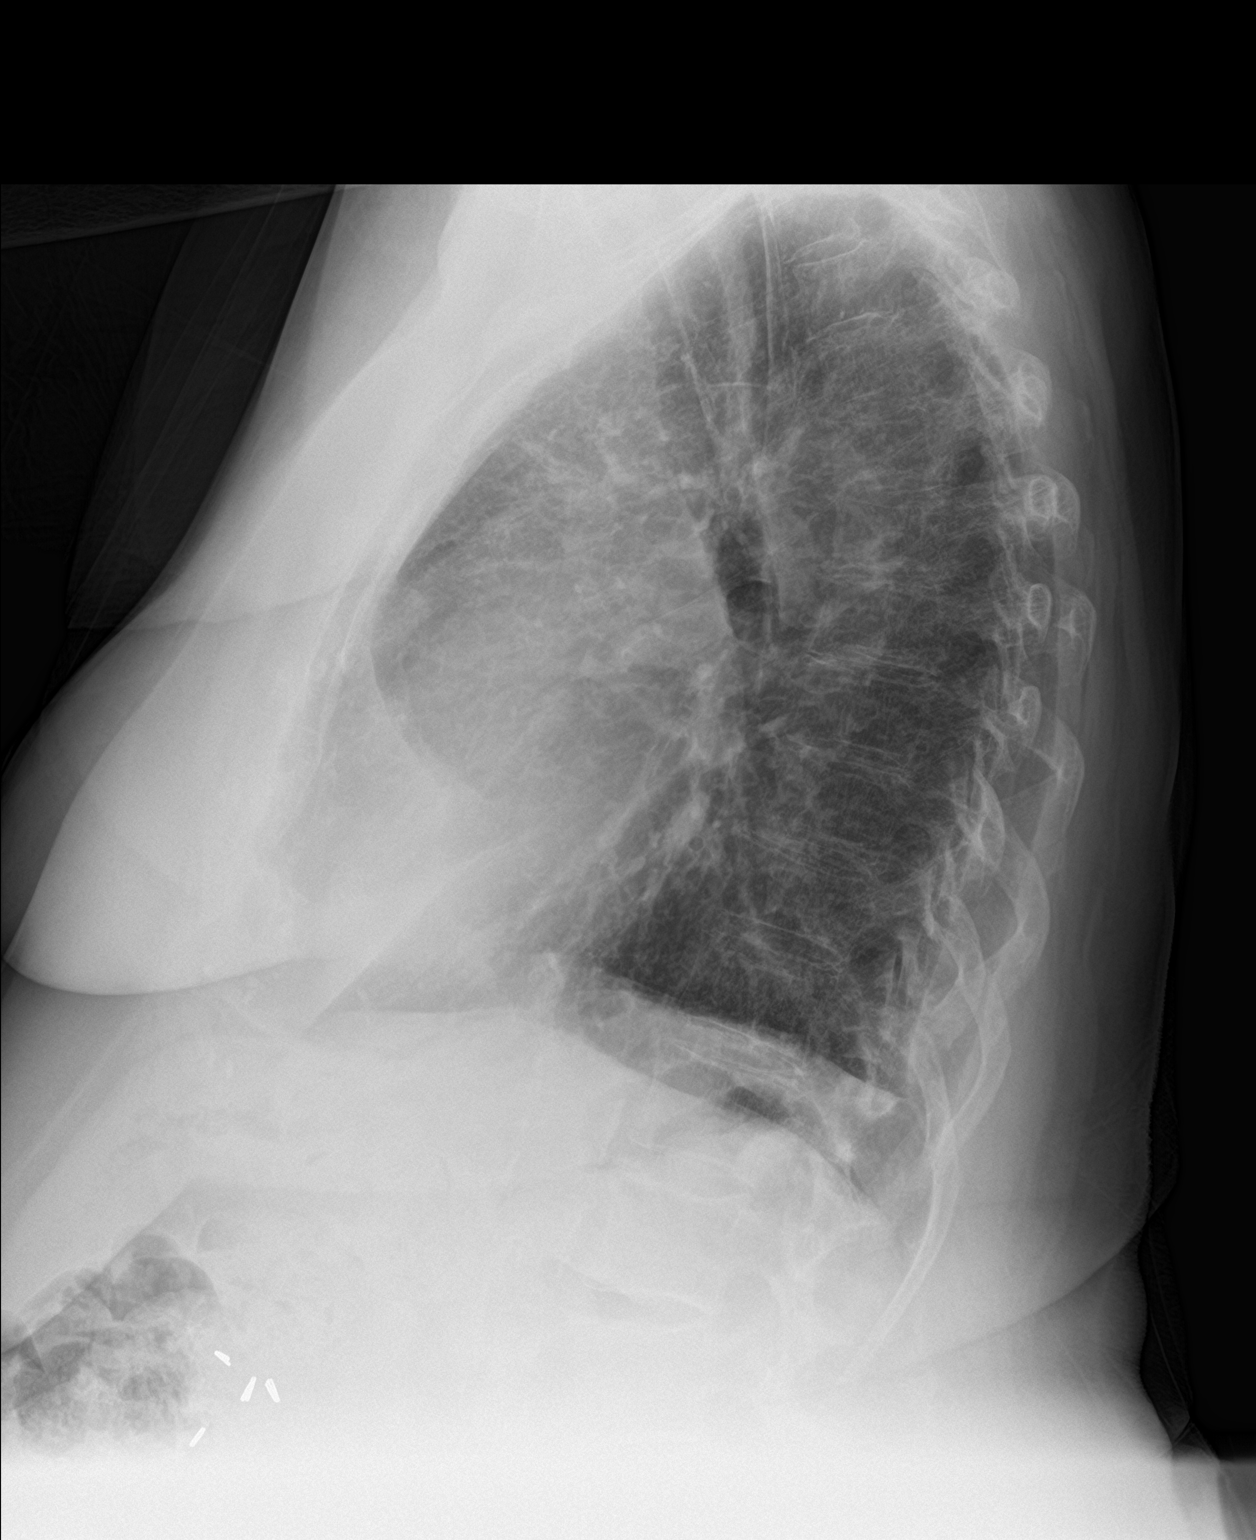

[2 of 2 positions shown; findings below may reference images not displayed]

FINDINGS: Mediastinum hilar structures normal. Stable cardiomegaly with normal
pulmonary vascularity. Diffuse bilateral from interstitial
prominence most consistent chronic interstitial lung disease. No
interim change. No focal alveolar infiltrate. No pleural effusion or
pneumothorax. Degenerative changes scoliosis thoracolumbar spine.
IMPRESSION: Number changes of chronic interstitial lung disease again noted.
Similar findings noted on prior exam. No focal alveolar infiltrate
noted.

2.  Stable cardiomegaly.

## 2020-02-20 DIAGNOSIS — C678 Malignant neoplasm of overlapping sites of bladder: Secondary | ICD-10-CM | POA: Diagnosis not present

## 2020-03-14 ENCOUNTER — Other Ambulatory Visit: Payer: Self-pay | Admitting: Cardiology

## 2020-03-15 NOTE — Telephone Encounter (Signed)
Prescription refill request for Eliquis received. Indication:atrial fibrillation Last office visit:12/21 Lurena Joiner Scr:1.06  7/21 Age: 87 Weight: 72.5 kg  Prescription refilled

## 2020-03-22 ENCOUNTER — Encounter: Payer: Self-pay | Admitting: Family Medicine

## 2020-03-22 DIAGNOSIS — H9190 Unspecified hearing loss, unspecified ear: Secondary | ICD-10-CM

## 2020-03-25 DIAGNOSIS — H9313 Tinnitus, bilateral: Secondary | ICD-10-CM | POA: Diagnosis not present

## 2020-03-25 DIAGNOSIS — H906 Mixed conductive and sensorineural hearing loss, bilateral: Secondary | ICD-10-CM | POA: Diagnosis not present

## 2020-03-25 DIAGNOSIS — Z822 Family history of deafness and hearing loss: Secondary | ICD-10-CM | POA: Diagnosis not present

## 2020-04-04 ENCOUNTER — Other Ambulatory Visit: Payer: Self-pay | Admitting: Adult Health

## 2020-04-21 ENCOUNTER — Encounter: Payer: Self-pay | Admitting: Family Medicine

## 2020-04-21 DIAGNOSIS — R350 Frequency of micturition: Secondary | ICD-10-CM

## 2020-04-22 MED ORDER — MIRABEGRON ER 50 MG PO TB24: 50.0000 mg | ORAL_TABLET | Freq: Every day | ORAL | 3 refills | Status: AC

## 2020-04-30 ENCOUNTER — Other Ambulatory Visit: Payer: Self-pay | Admitting: Cardiology

## 2020-05-02 ENCOUNTER — Other Ambulatory Visit: Payer: Self-pay | Admitting: Adult Health

## 2020-05-13 ENCOUNTER — Other Ambulatory Visit: Payer: Self-pay | Admitting: Adult Health

## 2020-05-15 ENCOUNTER — Telehealth: Payer: Self-pay | Admitting: Adult Health

## 2020-05-15 MED ORDER — BREO ELLIPTA 200-25 MCG/INH IN AEPB: 1.0000 | INHALATION_SPRAY | Freq: Every day | RESPIRATORY_TRACT | 0 refills | Status: DC

## 2020-05-15 NOTE — Telephone Encounter (Signed)
Spoke with pt's daughter ok per DPR  Breo denied bc she is overdue ov  Appt scheduled with VS for 06/14/20 at 9:15 am and I refilled Breo x 1 only pending this appt

## 2020-06-04 ENCOUNTER — Telehealth: Payer: Self-pay | Admitting: Pharmacist

## 2020-06-04 ENCOUNTER — Telehealth: Payer: Medicare Other

## 2020-06-04 NOTE — Telephone Encounter (Signed)
Attempt to contact patient for CCM follow up. Initial call to patient's caregiver was answered but her daughter, [name] asked that I call back after 4pm as she was at work.  Called back after 4pm but no answer. LM on VM of [name]'s phone with CB# [phone].

## 2020-06-13 NOTE — Progress Notes (Signed)
HPI: FU atrial fibrillation. TEE February 2015 showed normal LV function, lambl's excrescence, mild to moderate left atrial enlargement. Echocardiogram repeated February 2020; normal LV function, mild left atrial enlargement. Chest CT November 2020 showed interstitial lung disease possibly UIP.  There was three-vessel coronary disease and calcifications on the aortic and mitral annulus. I was concerned about continuing amiodarone in the setting of lung disease.  Patient failed tikosyn. Plan is now rate controlled on anticoagulation.  Since she was last seen she has some dyspnea on exertion and fatigue but tolerable by her report.  She denies chest pain, palpitations, syncope or bleeding.  Occasional minimal pedal edema.  Current Outpatient Medications  Medication Sig Dispense Refill   acetaminophen (TYLENOL) 325 MG tablet Take 2 tablets (650 mg total) by mouth every 6 (six) hours as needed for mild pain or headache. 30 tablet 0   albuterol (PROAIR HFA) 108 (90 Base) MCG/ACT inhaler Inhale 2 puffs into the lungs every 4 (four) hours as needed for wheezing or shortness of breath. 18 g 11   chlorpheniramine (CHLOR-TRIMETON) 4 MG tablet Take 4 mg by mouth 2 (two) times daily as needed for allergies.     Diclofenac Sodium (VOLTAREN EX) Apply 1 application topically 2 (two) times daily as needed (pain).      diltiazem (TIAZAC) 120 MG 24 hr capsule TAKE 1 CAPSULE BY MOUTH DAILY 90 capsule 2   ELIQUIS 5 MG TABS tablet TAKE 1 TABLET(5 MG) BY MOUTH TWICE DAILY 180 tablet 1   fluticasone (FLONASE) 50 MCG/ACT nasal spray Place 1 spray into both nostrils daily as needed for allergies or rhinitis. 16 g 2   fluticasone furoate-vilanterol (BREO ELLIPTA) 200-25 MCG/INH AEPB Inhale 1 puff into the lungs daily. NEEDS OFFICE VISIT 90 each 3   furosemide (LASIX) 20 MG tablet Take 1 tablet (20 mg total) by mouth daily. Use as needed for swelling 90 tablet 3   mirabegron ER (MYRBETRIQ) 50 MG TB24 tablet Take 1  tablet (50 mg total) by mouth daily. 90 tablet 3   montelukast (SINGULAIR) 10 MG tablet Take 1 tablet (10 mg total) by mouth daily. 90 tablet 3   Multiple Vitamins-Minerals (MULTIVITAMIN WITH MINERALS) tablet Take 1 tablet by mouth daily.     traMADol (ULTRAM) 50 MG tablet Take 1 tablet (50 mg total) by mouth every 12 (twelve) hours as needed (pain). 60 tablet 0   No current facility-administered medications for this visit.     Past Medical History:  Diagnosis Date   Arthritis    Asthma    Atrial fibrillation ([place])    a. s/p DCCV in 2015, on Amiodarone and Eliquis   CHF (congestive heart failure) ([place])    Dysrhythmia    afib -hx    Neuromuscular disorder ([place])    peripheral neuropathy    Pneumonia    hx of x 2    PVC's (premature ventricular contractions)     Past Surgical History:  Procedure Laterality Date   APPENDECTOMY     CARDIOVERSION N/A 03/08/2013   Procedure: CARDIOVERSION;  Surgeon: Larey Dresser, MD;  Location: [facility];  Service: Cardiovascular;  Laterality: N/A;   CARDIOVERSION N/A 07/05/2019   Procedure: CARDIOVERSION;  Surgeon: Sueanne Margarita, MD;  Location: [facility];  Service: Cardiovascular;  Laterality: N/A;   CHOLECYSTECTOMY     oophrectomy     TEE WITHOUT CARDIOVERSION N/A 03/08/2013   Procedure: TRANSESOPHAGEAL ECHOCARDIOGRAM (TEE);  Surgeon: Larey Dresser, MD;  Location: [facility]  ENDOSCOPY;  Service: Cardiovascular;  Laterality: N/A;   TONSILLECTOMY     TRANSURETHRAL RESECTION OF BLADDER TUMOR WITH GYRUS (TURBT-GYRUS)  05/13/2016   urology wiht [facility]   TRANSURETHRAL RESECTION OF BLADDER TUMOR WITH MITOMYCIN-C N/A 12/24/2017   Procedure: TRANSURETHRAL RESECTION OF BLADDER TUMOR;  Surgeon: Lucas Mallow, MD;  Location: [facility];  Service: Urology;  Laterality: N/A;    Social History   Socioeconomic History   Marital status: Divorced    Spouse name: Not on file   Number of children: Not on file   Years of education: Not on file   Highest  education level: Not on file  Occupational History   Occupation: Retired    Fish farm manager: RETIRED  Tobacco Use   Smoking status: Former    Packs/day: 1.00    Years: 20.00    Pack years: 20.00    Types: Cigarettes    Quit date: 04/13/1981    Years since quitting: 39.2   Smokeless tobacco: Never  Vaping Use   Vaping Use: Never used  Substance and Sexual Activity   Alcohol use: Not Currently    Comment: Occasional   Drug use: No   Sexual activity: Never  Other Topics Concern   Not on file  Social History Narrative   Not on file   Social Determinants of Health   Financial Resource Strain: Not on file  Food Insecurity: Not on file  Transportation Needs: Not on file  Physical Activity: Not on file  Stress: Not on file  Social Connections: Not on file  Intimate Partner Violence: Not on file    Family History  Problem Relation Age of Onset   Heart failure Mother    Diabetes Father    Heart disease Other        No family history    ROS: arthritis but no fevers or chills, productive cough, hemoptysis, dysphasia, odynophagia, melena, hematochezia, dysuria, hematuria, rash, seizure activity, orthopnea, PND, claudication. Remaining systems are negative.  Physical Exam: Well-developed well-nourished in no acute distress.  Skin is warm and dry.  HEENT is normal.  Neck is supple.  Chest is clear to auscultation with normal expansion.  Cardiovascular exam is irregular Abdominal exam nontender or distended. No masses palpated. Extremities show trace edema. neuro grossly intact   A/P  1 permanent atrial fibrillation-Continue cardizem and apixaban.  Amiodarone was felt to potentially contribute to lung disease previously and was discontinued.  She failed Tikosyn.  Plan is now rate control and anticoagulation. Check hgb and renal function.   2 chronic diastolic congestive heart failure-she appears to be euvolemic.  Continue diuretic at present dose.  3 hypertension-blood pressure  controlled.  Continue present medical regimen.  4 interstitial lung disease-followed by pulmonary.  Kirk Ruths, MD

## 2020-06-14 ENCOUNTER — Other Ambulatory Visit: Payer: Self-pay | Admitting: Pulmonary Disease

## 2020-06-14 ENCOUNTER — Other Ambulatory Visit: Payer: Self-pay

## 2020-06-14 ENCOUNTER — Encounter: Payer: Self-pay | Admitting: Pulmonary Disease

## 2020-06-14 ENCOUNTER — Ambulatory Visit (INDEPENDENT_AMBULATORY_CARE_PROVIDER_SITE_OTHER): Payer: Medicare Other | Admitting: Pulmonary Disease

## 2020-06-14 VITALS — BP 122/68 | HR 67 | Temp 97.4°F | Ht 63.0 in | Wt 159.0 lb

## 2020-06-14 DIAGNOSIS — J4540 Moderate persistent asthma, uncomplicated: Secondary | ICD-10-CM | POA: Diagnosis not present

## 2020-06-14 DIAGNOSIS — J3089 Other allergic rhinitis: Secondary | ICD-10-CM | POA: Diagnosis not present

## 2020-06-14 DIAGNOSIS — J849 Interstitial pulmonary disease, unspecified: Secondary | ICD-10-CM

## 2020-06-14 MED ORDER — BREO ELLIPTA 200-25 MCG/INH IN AEPB: 1.0000 | INHALATION_SPRAY | Freq: Every day | RESPIRATORY_TRACT | 3 refills | Status: AC

## 2020-06-14 MED ORDER — FLUTICASONE PROPIONATE 50 MCG/ACT NA SUSP: 1.0000 | Freq: Every day | NASAL | 2 refills | Status: DC | PRN

## 2020-06-14 MED ORDER — MONTELUKAST SODIUM 10 MG PO TABS: 10.0000 mg | ORAL_TABLET | Freq: Every day | ORAL | 3 refills | Status: AC

## 2020-06-14 NOTE — Patient Instructions (Signed)
Follow up in 6 months 

## 2020-06-14 NOTE — Progress Notes (Signed)
[facility]  Chief Complaint  Patient presents with  . Follow-up    No respiratory concerns. Requesting Breo refill.    Constitutional:  BP 122/68 (BP Location: Right Arm, Cuff Size: Normal)   Pulse 67   Temp (!) 97.4 F (36.3 C) (Temporal)   Ht 5\' 3"  (1.6 m)   Wt 159 lb (72.1 kg)   SpO2 97% Comment: RA  BMI 28.17 kg/m   Past Medical History:  OA, A fib, CHF, Neuropathy, Pneumonia  Past Surgical History:  She  has a past surgical history that includes Appendectomy; Cholecystectomy; Tonsillectomy; oophrectomy; TEE without cardioversion (N/A, 03/08/2013); Cardioversion (N/A, 03/08/2013); Transurethral resection of bladder tumor with gyrus (turbt-gyrus) (05/13/2016); Transurethral resection of bladder tumor with mitomycin-c (N/A, 12/24/2017); and Cardioversion (N/A, 07/05/2019).  Brief Summary:  Crystal Kerr is a 87 y.o. female former smoker with allergic asthma and rhinitis, and mild ILD from amiodarone.      Subjective:   She is here with her son.    She has noticed her heart rate going fast.  She is due to see cardiology later this month.  Feels more short of breath when her heart rate is fast.  Breathing okay otherwise.  Has sinus congestion with post nasal drip and then gets a cough.  Feels that breo, singulair, and chlorpheniramine work well.  Hasn't needed to use albuterol.  Sleeping okay.  Physical Exam:   Appearance - well kempt   ENMT - no sinus tenderness, no oral exudate, no LAN, Mallampati 2 airway, no stridor, wears dentures, clear nasal drainage  Respiratory - equal breath sounds bilaterally, scattered rhonchi that clears with coughing  CV - irregular  Ext - no clubbing, no edema  Skin - no rashes  Psych - normal mood and affect   Pulmonary testing:   PFT 03/19/14 >> FEV1 1.28 (85%), FEV1% 75, TLC 3.43 (72%), DLCO 68%, +BD from FEF 25-75  HP panel 11/23/17 >> negative  Chest Imaging:   HRCT  chest 03/04/17 >> patchy GGO b/l  HRCT chest 12/05/18 >> changes suggestive of mild CHP  Cardiac Tests:   Echo 02/14/18 >> EF greater than 65%, mild LA dilation  Social History:  She  reports that she quit smoking about 39 years ago. Her smoking use included cigarettes. She has a 20.00 pack-year smoking history. She has never used smokeless tobacco. She reports previous alcohol use. She reports that she does not use drugs.  Family History:  Her family history includes Diabetes in her father; Heart disease in an other family member; Heart failure in her mother.     Assessment/Plan:   Allergic asthma. - continue breo and singulair - she doesn't feel like she needs albuterol  Perennial allergic rhinitis. - continue chlorpheniramine and singulair - if symptoms progress then she can try using OTC flonase  ILD secondary to prior amiodarone therapy. - monitor clinically  Paroxysmal atrial fibrillation, Chronic diastolic CHF. - followed by Dr. Kirk Ruths with [facility]  Time Spent Involved in Patient Care on Day of Examination:  22 minutes  Follow up:  Patient Instructions  Follow up in 6 months   Medication List:   Allergies as of 06/14/2020   No Known Allergies     Medication List       Accurate as of June 14, 2020  9:38 AM. If you have any questions, ask your nurse or doctor.        acetaminophen 325 MG tablet  Commonly known as: TYLENOL Take 2 tablets (650 mg total) by mouth every 6 (six) hours as needed for mild pain or headache.   albuterol 108 (90 Base) MCG/ACT inhaler Commonly known as: ProAir HFA Inhale 2 puffs into the lungs every 4 (four) hours as needed for wheezing or shortness of breath.   Breo Ellipta 200-25 MCG/INH Aepb Generic drug: fluticasone furoate-vilanterol Inhale 1 puff into the lungs daily. NEEDS OFFICE VISIT   chlorpheniramine 4 MG tablet Commonly known as: CHLOR-TRIMETON Take 4 mg by mouth 2 (two) times daily as needed for  allergies.   diltiazem 120 MG 24 hr capsule Commonly known as: TIAZAC TAKE 1 CAPSULE BY MOUTH DAILY   Eliquis 5 MG Tabs tablet Generic drug: apixaban TAKE 1 TABLET(5 MG) BY MOUTH TWICE DAILY   fluticasone 50 MCG/ACT nasal spray Commonly known as: FLONASE Place 1 spray into both nostrils daily as needed for allergies or rhinitis. Started by: Chesley Mires, MD   furosemide 20 MG tablet Commonly known as: LASIX Take 1 tablet (20 mg total) by mouth daily. Use as needed for swelling   mirabegron ER 50 MG Tb24 tablet Commonly known as: Myrbetriq Take 1 tablet (50 mg total) by mouth daily.   montelukast 10 MG tablet Commonly known as: SINGULAIR Take 1 tablet (10 mg total) by mouth daily.   multivitamin with minerals tablet Take 1 tablet by mouth daily.   traMADol 50 MG tablet Commonly known as: ULTRAM Take 1 tablet (50 mg total) by mouth every 12 (twelve) hours as needed (pain).   VOLTAREN EX Apply 1 application topically 2 (two) times daily as needed (pain).       Signature:  Chesley Mires, MD [place] Pager - [phone] 06/14/2020, 9:38 AM

## 2020-06-26 ENCOUNTER — Encounter: Payer: Self-pay | Admitting: Cardiology

## 2020-06-26 ENCOUNTER — Ambulatory Visit (INDEPENDENT_AMBULATORY_CARE_PROVIDER_SITE_OTHER): Payer: Medicare Other | Admitting: Cardiology

## 2020-06-26 ENCOUNTER — Other Ambulatory Visit: Payer: Self-pay

## 2020-06-26 VITALS — BP 124/70 | HR 62 | Ht 63.0 in | Wt 157.0 lb

## 2020-06-26 DIAGNOSIS — I5032 Chronic diastolic (congestive) heart failure: Secondary | ICD-10-CM | POA: Diagnosis not present

## 2020-06-26 DIAGNOSIS — I10 Essential (primary) hypertension: Secondary | ICD-10-CM | POA: Diagnosis not present

## 2020-06-26 DIAGNOSIS — I4819 Other persistent atrial fibrillation: Secondary | ICD-10-CM | POA: Diagnosis not present

## 2020-06-26 NOTE — Patient Instructions (Signed)

## 2020-06-27 LAB — CBC
Hematocrit: 41.7 % (ref 34.0–46.6)
Hemoglobin: 13.2 g/dL (ref 11.1–15.9)
MCH: 28.1 pg (ref 26.6–33.0)
MCHC: 31.7 g/dL (ref 31.5–35.7)
MCV: 89 fL (ref 79–97)
Platelets: 238 10*3/uL (ref 150–450)
RBC: 4.7 x10E6/uL (ref 3.77–5.28)
RDW: 13.2 % (ref 11.7–15.4)
WBC: 7.9 10*3/uL (ref 3.4–10.8)

## 2020-07-01 ENCOUNTER — Other Ambulatory Visit: Payer: Self-pay | Admitting: *Deleted

## 2020-07-01 DIAGNOSIS — I4819 Other persistent atrial fibrillation: Secondary | ICD-10-CM

## 2020-07-08 ENCOUNTER — Ambulatory Visit: Payer: Medicare Other | Attending: Internal Medicine

## 2020-07-08 ENCOUNTER — Telehealth: Payer: Self-pay | Admitting: Pharmacist

## 2020-07-08 ENCOUNTER — Other Ambulatory Visit (HOSPITAL_BASED_OUTPATIENT_CLINIC_OR_DEPARTMENT_OTHER): Payer: Self-pay

## 2020-07-08 DIAGNOSIS — I4819 Other persistent atrial fibrillation: Secondary | ICD-10-CM | POA: Diagnosis not present

## 2020-07-08 DIAGNOSIS — Z23 Encounter for immunization: Secondary | ICD-10-CM | POA: Diagnosis not present

## 2020-07-08 MED ORDER — COVID-19 MRNA VAC-TRIS(PFIZER) 30 MCG/0.3ML IM SUSP
INTRAMUSCULAR | 0 refills | Status: DC
  Filled 2020-07-08: qty 0.3, 1d supply, fill #0

## 2020-07-08 NOTE — Progress Notes (Signed)
   Covid-19 Vaccination Clinic  Name:  Crystal Kerr    MRN: [id number] DOB: [birth year]  07/08/2020  Ms. Crystal Kerr was observed post Covid-19 immunization for 15 minutes without incident. She was provided with Vaccine Information Sheet and instruction to access the V-Safe system.   Ms. Crystal Kerr was instructed to call 911 with any severe reactions post vaccine: Difficulty breathing  Swelling of face and throat  A fast heartbeat  A bad rash all over body  Dizziness and weakness   Immunizations Administered     Name Date Dose VIS Date Route   PFIZER Comrnaty(Gray TOP) Covid-19 Vaccine 07/08/2020  2:11 PM 0.3 mL 12/21/2019 Intramuscular   Manufacturer: [place]   Lot: [id number]   [place]: [phone]

## 2020-07-08 NOTE — Telephone Encounter (Signed)
Patient has follow up CCM appt on 06/04/2020 - but was unable to reach patient. Rescheduled for 07/10/2020 but appt was cancelled with note that appt was not needed and that medications were OK.  Patient will be unenrolled in pharmacy CCM program unless future need arises.   Allysia Ingles

## 2020-07-09 LAB — BASIC METABOLIC PANEL
BUN/Creatinine Ratio: 28 (ref 12–28)
BUN: 32 mg/dL (ref 10–36)
CO2: 24 mmol/L (ref 20–29)
Calcium: 9.4 mg/dL (ref 8.7–10.3)
Chloride: 103 mmol/L (ref 96–106)
Creatinine, Ser: 1.16 mg/dL — ABNORMAL HIGH (ref 0.57–1.00)
Glucose: 101 mg/dL — ABNORMAL HIGH (ref 65–99)
Potassium: 4.6 mmol/L (ref 3.5–5.2)
Sodium: 143 mmol/L (ref 134–144)
eGFR: 44 mL/min/{1.73_m2} — ABNORMAL LOW (ref >59)

## 2020-07-10 ENCOUNTER — Telehealth: Payer: Medicare Other

## 2020-07-11 ENCOUNTER — Encounter: Payer: Self-pay | Admitting: *Deleted

## 2020-08-08 DIAGNOSIS — C678 Malignant neoplasm of overlapping sites of bladder: Secondary | ICD-10-CM | POA: Diagnosis not present

## 2020-08-13 ENCOUNTER — Encounter: Payer: Self-pay | Admitting: Family Medicine

## 2020-08-14 ENCOUNTER — Telehealth: Payer: Self-pay | Admitting: Cardiology

## 2020-08-14 ENCOUNTER — Other Ambulatory Visit: Payer: Self-pay | Admitting: Urology

## 2020-08-14 NOTE — Telephone Encounter (Signed)
Clinical pharmacist to review Eliquis 

## 2020-08-14 NOTE — Telephone Encounter (Signed)
     [facility] Pre-operative Risk Assessment    Patient Name: Crystal Kerr  DOB: 21-Oct-1924 MRN: [id number]  HEARTCARE STAFF:  - IMPORTANT!!!!!! Under Visit Info/Reason for Call, type in Other and utilize the format Clearance MM/DD/YY or Clearance TBD. Do not use dashes or single digits. - Please review there is not already an duplicate clearance open for this procedure. - If request is for dental extraction, please clarify the # of teeth to be extracted. - If the patient is currently at the dentist's office, call Pre-Op Callback Staff (MA/nurse) to input urgent request.  - If the patient is not currently in the dentist office, please route to the Pre-Op pool.  Request for surgical clearance:  What type of surgery is being performed? Bladder tumor  When is this surgery scheduled? 08/26/20  What type of clearance is required (medical clearance vs. Pharmacy clearance to hold med vs. Both)? Both  Are there any medications that need to be held prior to surgery and how long? Eliquis, 2 days prior  Practice name and name of physician performing surgery? [name] - [facility]  What is the office phone number? [place]   7.   What is the office fax number? [phone]  8.   Anesthesia type (None, local, MAC, general) ? General   Angeline S Hammer 08/14/2020, 10:42 AM  _________________________________________________________________   (provider comments below)

## 2020-08-15 NOTE — Telephone Encounter (Signed)
    Patient Name: Crystal Kerr  DOB: 09/14/1924 MRN: [id number]  Primary Cardiologist: Kirk Ruths, MD  Chart reviewed as part of pre-operative protocol coverage. She was recently seen in follow up 06/26/20 with [name]. She has a hx of permanent atrial fibrillation and what sounds like chronic, unchanged dyspnea per chart review. She does not have a definitive dx of CAD however has been noted to have calcifications of the aorta and coronaries on chest imaging. Given her advanced age and presumed CAD on imaging, she will be a moderate risk to proceed with surgery however this is not prohibitive.     Patient with diagnosis of afib on Eliquis for anticoagulation.     Procedure: Bladder tumor Date of procedure: 08/26/20   CHA2DS2-VASc Score = 6  This indicates a 9.7% annual risk of stroke. The patient's score is based upon: CHF History: Yes HTN History: Yes Diabetes History: No Stroke History: No Vascular Disease History: Yes Age Score: 2 Gender Score: 1    CrCl 28.66 ml/min Platelet count 238   Per office protocol, patient can hold Eliquis for 3 days prior to procedure.  The patient was advised that if she develops new symptoms prior to surgery to contact our office to arrange for a follow-up visit, and she verbalized understanding.  I will route this recommendation to the requesting party via Epic fax function and remove from pre-op pool.  Please call with questions.  Kathyrn Drown, NP 08/15/2020, 11:33 AM

## 2020-08-15 NOTE — Telephone Encounter (Signed)
Patient with diagnosis of afib on Eliquis for anticoagulation.    Procedure: Bladder tumor Date of procedure: 08/26/20  CHA2DS2-VASc Score = 6  This indicates a 9.7% annual risk of stroke. The patient's score is based upon: CHF History: Yes HTN History: Yes Diabetes History: No Stroke History: No Vascular Disease History: Yes Age Score: 2 Gender Score: 1    CrCl 28.66 ml/min Platelet count 238  Per office protocol, patient can hold Eliquis for 3 days prior to procedure.

## 2020-08-19 NOTE — Patient Instructions (Addendum)
DUE TO COVID-19 ONLY ONE VISITOR IS ALLOWED TO COME WITH YOU AND STAY IN THE WAITING ROOM ONLY DURING PRE OP AND PROCEDURE DAY OF SURGERY. THE 1 VISITOR  MAY VISIT WITH YOU AFTER SURGERY IN YOUR PRIVATE ROOM DURING VISITING HOURS ONLY!               [place]   Your procedure is scheduled on: 08/26/20   Report to [facility] Main  Entrance   Report to admitting at: 7:30 AM     Call this number if you have problems the morning of surgery [phone]    Remember: Do not eat solid  food :After Midnight. Clear liquids until: 6:30 am.  CLEAR LIQUID DIET  Foods Allowed                                                                     Foods Excluded  Coffee and tea, regular and decaf                             liquids that you cannot  Plain Jell-O any favor except red or purple                                           see through such as: Fruit ices (not with fruit pulp)                                     milk, soups, orange juice  Iced Popsicles                                    All solid food Carbonated beverages, regular and diet                                    Cranberry, grape and apple juices Sports drinks like Gatorade Lightly seasoned clear broth or consume(fat free) Sugar, honey syrup  Sample Menu Breakfast                                Lunch                                     Supper Cranberry juice                    Beef broth                            Chicken broth Jell-O  Grape juice                           Apple juice Coffee or tea                        Jell-O                                      Popsicle                                                Coffee or tea                        Coffee or tea  _____________________________________________________________________   BRUSH YOUR TEETH MORNING OF SURGERY AND RINSE YOUR MOUTH OUT, NO CHEWING GUM CANDY OR MINTS.    Take these medicines the morning  of surgery with A SIP OF WATER: diltiazem,myrbetriq.Use inhalers as usual.Chlorpheniramine as needed.  Stop taking  Eliquis last dose on Aug. 11  as instructed by Dr. Trellis Moment may not have any metal on your body including hair pins and              piercings  Do not wear jewelry, make-up, lotions, powders or perfumes, deodorant             Do not wear nail polish on your fingernails.  Do not shave  48 hours prior to surgery.    Do not bring valuables to the hospital. [place].  Contacts, dentures or bridgework may not be worn into surgery.    Patients discharged the day of surgery will not be allowed to drive home. IF YOU ARE HAVING SURGERY AND GOING HOME THE SAME DAY, YOU MUST HAVE AN ADULT TO DRIVE YOU HOME AND BE WITH YOU FOR 24 HOURS. YOU MAY GO HOME BY TAXI OR UBER OR ORTHERWISE, BUT AN ADULT MUST ACCOMPANY YOU HOME AND STAY WITH YOU FOR 24 HOURS.  Name and phone number of your driver:  Special Instructions: N/A              Please read over the following fact sheets you were given: _____________________________________________________________________           [facility] - Preparing for Surgery Before surgery, you can play an important role.  Because skin is not sterile, your skin needs to be as free of germs as possible.  You can reduce the number of germs on your skin by washing with CHG (chlorahexidine gluconate) soap before surgery.  CHG is an antiseptic cleaner which kills germs and bonds with the skin to continue killing germs even after washing. Please DO NOT use if you have an allergy to CHG or antibacterial soaps.  If your skin becomes reddened/irritated stop using the CHG and inform your nurse when you arrive at Short Stay. Do not shave (including legs and underarms) for at  least 48 hours prior to the first CHG shower.  You may shave your face/neck. Please follow these instructions  carefully:  1.  Shower with CHG Soap the night before surgery and the  morning of Surgery.  2.  If you choose to wash your hair, wash your hair first as usual with your  normal  shampoo.  3.  After you shampoo, rinse your hair and body thoroughly to remove the  shampoo.                           4.  Use CHG as you would any other liquid soap.  You can apply chg directly  to the skin and wash                       Gently with a scrungie or clean washcloth.  5.  Apply the CHG Soap to your body ONLY FROM THE NECK DOWN.   Do not use on face/ open                           Wound or open sores. Avoid contact with eyes, ears mouth and genitals (private parts).                       Wash face,  Genitals (private parts) with your normal soap.             6.  Wash thoroughly, paying special attention to the area where your surgery  will be performed.  7.  Thoroughly rinse your body with warm water from the neck down.  8.  DO NOT shower/wash with your normal soap after using and rinsing off  the CHG Soap.                9.  Pat yourself dry with a clean towel.            10.  Wear clean pajamas.            11.  Place clean sheets on your bed the night of your first shower and do not  sleep with pets. Day of Surgery : Do not apply any lotions/deodorants the morning of surgery.  Please wear clean clothes to the hospital/surgery center.  FAILURE TO FOLLOW THESE INSTRUCTIONS MAY RESULT IN THE CANCELLATION OF YOUR SURGERY PATIENT SIGNATURE_________________________________  NURSE SIGNATURE__________________________________  ________________________________________________________________________

## 2020-08-20 ENCOUNTER — Encounter (HOSPITAL_COMMUNITY): Payer: Self-pay

## 2020-08-20 ENCOUNTER — Encounter (HOSPITAL_COMMUNITY)
Admission: RE | Admit: 2020-08-20 | Discharge: 2020-08-20 | Disposition: A | Discharge status: Home / Self Care | Payer: Medicare Other | Source: Ambulatory Visit | Attending: Urology | Admitting: Urology

## 2020-08-20 ENCOUNTER — Other Ambulatory Visit: Payer: Self-pay

## 2020-08-20 DIAGNOSIS — Z01812 Encounter for preprocedural laboratory examination: Secondary | ICD-10-CM | POA: Insufficient documentation

## 2020-08-20 HISTORY — DX: Polyneuropathy, unspecified: G62.9

## 2020-08-20 HISTORY — DX: Hypoglycemia, unspecified: E16.2

## 2020-08-20 HISTORY — DX: Unsteadiness on feet: R26.81

## 2020-08-20 LAB — BASIC METABOLIC PANEL
Anion gap: 8 (ref 5–15)
BUN: 29 mg/dL — ABNORMAL HIGH (ref 8–23)
CO2: 29 mmol/L (ref 22–32)
Calcium: 9.5 mg/dL (ref 8.9–10.3)
Chloride: 102 mmol/L (ref 98–111)
Creatinine, Ser: 1 mg/dL (ref 0.44–1.00)
GFR, Estimated: 53 mL/min — ABNORMAL LOW (ref >60)
Glucose, Bld: 98 mg/dL (ref 70–99)
Potassium: 4.5 mmol/L (ref 3.5–5.1)
Sodium: 139 mmol/L (ref 135–145)

## 2020-08-20 LAB — CBC
HCT: 42.5 % (ref 36.0–46.0)
Hemoglobin: 13.2 g/dL (ref 12.0–15.0)
MCH: 28 pg (ref 26.0–34.0)
MCHC: 31.1 g/dL (ref 30.0–36.0)
MCV: 90.2 fL (ref 80.0–100.0)
Platelets: 251 10*3/uL (ref 150–400)
RBC: 4.71 MIL/uL (ref 3.87–5.11)
RDW: 14.8 % (ref 11.5–15.5)
WBC: 8.5 10*3/uL (ref 4.0–10.5)
nRBC: 0 % (ref 0.0–0.2)

## 2020-08-20 NOTE — Progress Notes (Addendum)
COVID Vaccine Completed: yes Date COVID Vaccine completed:x2 Has received booster:x1 COVID vaccine manufacturer: Pfizer     Date of COVID positive in last 90 days: No  PCP - [name], MD Cardiologist - Dr. Kirk Ruths cardiac clearance note 08/14/2020 in epic Pulmonologist-Dr. Governor Rooks   Chest x-ray - greater than 1 year in epic EKG - 12/28/2019 in epic Stress Test - greater than 2 years  ECHO - greater than 2 years  Cardiac Cath - N/A Pacemaker/ICD device last checked: N/A  Sleep Study - N/A CPAP - N/A  Fasting Blood Sugar - N/A Checks Blood Sugar __N/A___ times a day  Blood Thinner Instructions:Per office protocol, patient can hold Eliquis for 3 days prior to procedure.See cardiac clearance note Aspirin Instructions:N/A Last Dose:N/A  Activity level:  Unable to go up a flight of stairs without symptoms    Anesthesia review: Atrial Fib, Chronic unchanged dyspnea, CHF, COPD  Patient denies shortness of breath, fever, cough and chest pain at PAT appointment   Patient verbalized understanding of instructions that were given to them at the PAT appointment. Patient was also instructed that they will need to review over the PAT instructions again at home before surgery.

## 2020-08-21 NOTE — Progress Notes (Signed)
Anesthesia Chart Review   Case: [id number] Date/Time: 08/26/20 0915   Procedure: TRANSURETHRAL RESECTION OF BLADDER TUMOR WITH GEMCITABINE BILATERAL RETROGRADE PYELOGRAM (Bilateral)   Anesthesia type: General   Pre-op diagnosis: BLADDER TUMOR   Location: [facility]   Surgeons: Lucas Mallow, MD       DISCUSSION:87 y.o. former smoker with h/o COPD, CHF, atrial fibrillation (on Eliquis), bladder tumor scheduled for above procedure 08/26/2020 with Dr. Link Snuffer.   Per cardiology preoperative evaluation 08/15/2020, "Chart reviewed as part of pre-operative protocol coverage. She was recently seen in follow up 06/26/20 with [name]. She has a hx of permanent atrial fibrillation and what sounds like chronic, unchanged dyspnea per chart review. She does not have a definitive dx of CAD however has been noted to have calcifications of the aorta and coronaries on chest imaging. Given her advanced age and presumed CAD on imaging, she will be a moderate risk to proceed with surgery however this is not prohibitive."  Advised to hold Eliquis 3 days prior to procedure.  VS: BP 135/78   Pulse 70   Temp 36.7 C (Oral)   Resp 16   Ht '5\' 3"'$  (1.6 m)   Wt 70.3 kg   SpO2 98%   BMI 27.46 kg/m   PROVIDERS: Copland, Gay Filler, MD is PCP   Kirk Ruths, MD is Cardiologist LABS: Labs reviewed: Acceptable for surgery. (all labs ordered are listed, but only abnormal results are displayed)  Labs Reviewed  BASIC METABOLIC PANEL - Abnormal; Notable for the following components:      Result Value   BUN 29 (*)    GFR, Estimated 53 (*)    All other components within normal limits  CBC     IMAGES:   EKG:   CV: Echo 02/14/2018  1. The left ventricle has hyperdynamic systolic function of [id number]. The  cavity size is normal. There is no left ventricular wall thickness. Echo  evidence of impaired relaxation diastolic filling patterns.   2. Mildly dilated left atrial size.   3.  Normal right atrial size.   4. Trivial pericardial effusion, as described above.   5. Normal tricuspid valve.   6. The aortic valve tricuspid. There is moderate thickening and moderate  sclerosis of the aortic valve.   7. No atrial level shunt detected by color flow Doppler.  Past Medical History:  Diagnosis Date   Arthritis    Asthma    Atrial fibrillation ([place])    a. s/p DCCV in 2015, on Amiodarone and Eliquis   CHF (congestive heart failure) (HCC)    COPD (chronic obstructive pulmonary disease) (HCC)    Dysrhythmia    afib -hx    Hypoglycemia    Neuromuscular disorder (HCC)    peripheral neuropathy    Peripheral neuropathy    hands and feet   Pneumonia    hx of x 2    PVC's (premature ventricular contractions)    Unstable gait     Past Surgical History:  Procedure Laterality Date   APPENDECTOMY     CARDIOVERSION N/A 03/08/2013   Procedure: CARDIOVERSION;  Surgeon: Larey Dresser, MD;  Location: [place];  Service: Cardiovascular;  Laterality: N/A;   CARDIOVERSION N/A 07/05/2019   Procedure: CARDIOVERSION;  Surgeon: Sueanne Margarita, MD;  Location: [place];  Service: Cardiovascular;  Laterality: N/A;   CHOLECYSTECTOMY     oophrectomy     TEE WITHOUT CARDIOVERSION N/A 03/08/2013   Procedure: TRANSESOPHAGEAL ECHOCARDIOGRAM (  TEE);  Surgeon: Larey Dresser, MD;  Location: [place];  Service: Cardiovascular;  Laterality: N/A;   TONSILLECTOMY     TRANSURETHRAL RESECTION OF BLADDER TUMOR WITH GYRUS (TURBT-GYRUS)  05/13/2016   urology wiht [facility]   TRANSURETHRAL RESECTION OF BLADDER TUMOR WITH MITOMYCIN-C N/A 12/24/2017   Procedure: TRANSURETHRAL RESECTION OF BLADDER TUMOR;  Surgeon: Lucas Mallow, MD;  Location: [facility];  Service: Urology;  Laterality: N/A;    MEDICATIONS:  acetaminophen (TYLENOL) 325 MG tablet   albuterol (PROAIR HFA) 108 (90 Base) MCG/ACT inhaler   chlorpheniramine (CHLOR-TRIMETON) 4 MG tablet   COVID-19 mRNA Vac-TriS, Pfizer, SUSP injection    diclofenac Sodium (VOLTAREN) 1 % GEL   diltiazem (TIAZAC) 120 MG 24 hr capsule   ELIQUIS 5 MG TABS tablet   fluticasone (FLONASE) 50 MCG/ACT nasal spray   fluticasone furoate-vilanterol (BREO ELLIPTA) 200-25 MCG/INH AEPB   furosemide (LASIX) 20 MG tablet   mirabegron ER (MYRBETRIQ) 50 MG TB24 tablet   montelukast (SINGULAIR) 10 MG tablet   Multiple Vitamins-Minerals (MULTIVITAMIN WITH MINERALS) tablet   traMADol (ULTRAM) 50 MG tablet   No current facility-administered medications for this encounter.    Konrad Felix, PA-C [facility] [phone]

## 2020-08-21 NOTE — Anesthesia Preprocedure Evaluation (Addendum)
Anesthesia Evaluation  Patient identified by MRN, date of birth, ID band Patient awake    Reviewed: Allergy & Precautions, NPO status , Patient's Chart, lab work & pertinent test results, reviewed documented beta blocker date and time   History of Anesthesia Complications (+) PONV and history of anesthetic complications  Airway Mallampati: II  TM Distance: >3 FB Neck ROM: Full    Dental  (+) Edentulous Upper, Edentulous Lower   Pulmonary shortness of breath, with exertion and lying, asthma , pneumonia, resolved, COPD,  COPD inhaler, former smoker,    Pulmonary exam normal breath sounds clear to auscultation + decreased breath sounds      Cardiovascular hypertension, Pt. on medications +CHF  + dysrhythmias Atrial Fibrillation + Valvular Problems/Murmurs  Rhythm:Irregular Rate:Normal  EKG 12/28/19 Atrial fibrillation with RVR, rate 103/min  Echo 02/14/18 1. The left ventricle has hyperdynamic systolic function of 123XX123. The cavity size is normal. There is no left ventricular wall thickness. Echo evidence of impaired relaxation diastolic filling patterns.  2. Mildly dilated left atrial size.  3. Normal right atrial size.  4. Trivial pericardial effusion, as described above.  5. Normal tricuspid valve.  6. The aortic valve tricuspid. There is moderate thickening and moderate  sclerosis of the aortic valve.  7. No atrial level shunt detected by color flow Doppler.   Hx/o DCCV 2015    Neuro/Psych Peripheral neuropathy  Unstable gait  Neuromuscular disease negative psych ROS   GI/Hepatic negative GI ROS, Neg liver ROS,   Endo/Other  negative endocrine ROS  Renal/GU Renal InsufficiencyRenal disease   Bladder tumor    Musculoskeletal  (+) Arthritis , Osteoarthritis,    Abdominal   Peds  Hematology Eliquis therapy-last dose 08/22/20   Anesthesia Other Findings   Reproductive/Obstetrics                             Anesthesia Physical Anesthesia Plan  ASA: 3  Anesthesia Plan: General   Post-op Pain Management:    Induction: Intravenous  PONV Risk Score and Plan: 4 or greater  Airway Management Planned: LMA  Additional Equipment: None  Intra-op Plan:   Post-operative Plan: Extubation in OR  Informed Consent: I have reviewed the patients History and Physical, chart, labs and discussed the procedure including the risks, benefits and alternatives for the proposed anesthesia with the patient or authorized representative who has indicated his/her understanding and acceptance.     Dental advisory given  Plan Discussed with: CRNA and Anesthesiologist  Anesthesia Plan Comments: (See PAT note 08/20/2020, Konrad Felix, PA-C)       Anesthesia Quick Evaluation

## 2020-08-26 ENCOUNTER — Ambulatory Visit (HOSPITAL_COMMUNITY): Payer: Medicare Other | Admitting: Anesthesiology

## 2020-08-26 ENCOUNTER — Ambulatory Visit (HOSPITAL_COMMUNITY): Payer: Medicare Other

## 2020-08-26 ENCOUNTER — Encounter (HOSPITAL_COMMUNITY): Payer: Self-pay | Admitting: Urology

## 2020-08-26 ENCOUNTER — Encounter (HOSPITAL_COMMUNITY)
Admission: RE | Disposition: A | Discharge status: Home / Self Care | Payer: Self-pay | Source: Home / Self Care | Attending: Urology

## 2020-08-26 ENCOUNTER — Ambulatory Visit (HOSPITAL_COMMUNITY): Payer: Medicare Other | Admitting: Physician Assistant

## 2020-08-26 ENCOUNTER — Ambulatory Visit (HOSPITAL_COMMUNITY)
Admission: RE | Admit: 2020-08-26 | Discharge: 2020-08-26 | Disposition: A | Discharge status: Home / Self Care | Payer: Medicare Other | Attending: Urology | Admitting: Urology

## 2020-08-26 DIAGNOSIS — Z79899 Other long term (current) drug therapy: Secondary | ICD-10-CM | POA: Diagnosis not present

## 2020-08-26 DIAGNOSIS — I4891 Unspecified atrial fibrillation: Secondary | ICD-10-CM | POA: Insufficient documentation

## 2020-08-26 DIAGNOSIS — Z7901 Long term (current) use of anticoagulants: Secondary | ICD-10-CM | POA: Insufficient documentation

## 2020-08-26 DIAGNOSIS — I493 Ventricular premature depolarization: Secondary | ICD-10-CM | POA: Diagnosis not present

## 2020-08-26 DIAGNOSIS — C679 Malignant neoplasm of bladder, unspecified: Secondary | ICD-10-CM | POA: Diagnosis not present

## 2020-08-26 DIAGNOSIS — C673 Malignant neoplasm of anterior wall of bladder: Secondary | ICD-10-CM | POA: Diagnosis not present

## 2020-08-26 DIAGNOSIS — J309 Allergic rhinitis, unspecified: Secondary | ICD-10-CM | POA: Diagnosis not present

## 2020-08-26 DIAGNOSIS — J449 Chronic obstructive pulmonary disease, unspecified: Secondary | ICD-10-CM | POA: Diagnosis not present

## 2020-08-26 DIAGNOSIS — C674 Malignant neoplasm of posterior wall of bladder: Secondary | ICD-10-CM | POA: Diagnosis not present

## 2020-08-26 DIAGNOSIS — Z87891 Personal history of nicotine dependence: Secondary | ICD-10-CM | POA: Insufficient documentation

## 2020-08-26 DIAGNOSIS — Z7951 Long term (current) use of inhaled steroids: Secondary | ICD-10-CM | POA: Insufficient documentation

## 2020-08-26 DIAGNOSIS — M25552 Pain in left hip: Secondary | ICD-10-CM

## 2020-08-26 HISTORY — PX: TRANSURETHRAL RESECTION OF BLADDER TUMOR WITH MITOMYCIN-C: SHX6459

## 2020-08-26 SURGERY — TRANSURETHRAL RESECTION OF BLADDER TUMOR WITH MITOMYCIN-C
Anesthesia: General | Laterality: Bilateral

## 2020-08-26 MED ORDER — DEXAMETHASONE SODIUM PHOSPHATE 10 MG/ML IJ SOLN
INTRAMUSCULAR | Status: AC
  Filled 2020-08-26: qty 1

## 2020-08-26 MED ORDER — PHENYLEPHRINE HCL (PRESSORS) 10 MG/ML IV SOLN
INTRAVENOUS | Status: DC | PRN
  Administered 2020-08-26: 40 ug via INTRAVENOUS

## 2020-08-26 MED ORDER — PROPOFOL 10 MG/ML IV BOLUS
INTRAVENOUS | Status: DC | PRN
  Administered 2020-08-26: 100 mg via INTRAVENOUS

## 2020-08-26 MED ORDER — LIDOCAINE HCL (CARDIAC) PF 100 MG/5ML IV SOSY
PREFILLED_SYRINGE | INTRAVENOUS | Status: DC | PRN
  Administered 2020-08-26: 60 mg via INTRAVENOUS

## 2020-08-26 MED ORDER — CEFAZOLIN SODIUM-DEXTROSE 2-4 GM/100ML-% IV SOLN
2.0000 g | INTRAVENOUS | Status: AC
  Administered 2020-08-26: 2 g via INTRAVENOUS
  Filled 2020-08-26: qty 100

## 2020-08-26 MED ORDER — FENTANYL CITRATE (PF) 100 MCG/2ML IJ SOLN
INTRAMUSCULAR | Status: AC
  Filled 2020-08-26: qty 2

## 2020-08-26 MED ORDER — ONDANSETRON HCL 4 MG/2ML IJ SOLN
INTRAMUSCULAR | Status: DC | PRN
  Administered 2020-08-26: 4 mg via INTRAVENOUS

## 2020-08-26 MED ORDER — SUGAMMADEX SODIUM 200 MG/2ML IV SOLN
INTRAVENOUS | Status: DC | PRN
  Administered 2020-08-26: 150 mg via INTRAVENOUS

## 2020-08-26 MED ORDER — APIXABAN 5 MG PO TABS: 5.0000 mg | ORAL_TABLET | Freq: Two times a day (BID) | ORAL | 1 refills | Status: DC

## 2020-08-26 MED ORDER — LACTATED RINGERS IV SOLN: INTRAVENOUS | Status: DC

## 2020-08-26 MED ORDER — ONDANSETRON HCL 4 MG/2ML IJ SOLN: 4.0000 mg | Freq: Once | INTRAMUSCULAR | Status: DC | PRN

## 2020-08-26 MED ORDER — ORAL CARE MOUTH RINSE: 15.0000 mL | Freq: Once | OROMUCOSAL | Status: AC

## 2020-08-26 MED ORDER — SODIUM CHLORIDE 0.9 % IR SOLN
Status: DC | PRN
  Administered 2020-08-26 (×5): 3000 mL via INTRAVESICAL
  Administered 2020-08-26: 6000 mL via INTRAVESICAL

## 2020-08-26 MED ORDER — TRAMADOL HCL 50 MG PO TABS
50.0000 mg | ORAL_TABLET | Freq: Once | ORAL | Status: AC
  Administered 2020-08-26: 50 mg via ORAL

## 2020-08-26 MED ORDER — KETOROLAC TROMETHAMINE 15 MG/ML IJ SOLN
INTRAMUSCULAR | Status: AC
  Administered 2020-08-26: 15 mg
  Filled 2020-08-26: qty 1

## 2020-08-26 MED ORDER — TRAMADOL HCL 50 MG PO TABS: 50.0000 mg | ORAL_TABLET | Freq: Two times a day (BID) | ORAL | 0 refills | Status: DC | PRN

## 2020-08-26 MED ORDER — BELLADONNA ALKALOIDS-OPIUM 16.2-60 MG RE SUPP
RECTAL | Status: DC | PRN
  Administered 2020-08-26: 1 via RECTAL

## 2020-08-26 MED ORDER — FENTANYL CITRATE (PF) 100 MCG/2ML IJ SOLN
25.0000 ug | INTRAMUSCULAR | Status: DC | PRN
  Administered 2020-08-26 (×2): 25 ug via INTRAVENOUS
  Administered 2020-08-26 (×2): 50 ug via INTRAVENOUS

## 2020-08-26 MED ORDER — KETOROLAC TROMETHAMINE 30 MG/ML IJ SOLN
15.0000 mg | Freq: Once | INTRAMUSCULAR | Status: AC
  Administered 2020-08-26: 15 mg via INTRAVENOUS

## 2020-08-26 MED ORDER — CHLORHEXIDINE GLUCONATE 0.12 % MT SOLN
15.0000 mL | Freq: Once | OROMUCOSAL | Status: AC
  Administered 2020-08-26: 15 mL via OROMUCOSAL

## 2020-08-26 MED ORDER — LIDOCAINE 2% (20 MG/ML) 5 ML SYRINGE
INTRAMUSCULAR | Status: AC
  Filled 2020-08-26: qty 5

## 2020-08-26 MED ORDER — ONDANSETRON HCL 4 MG/2ML IJ SOLN
INTRAMUSCULAR | Status: AC
  Filled 2020-08-26: qty 2

## 2020-08-26 MED ORDER — FENTANYL CITRATE (PF) 100 MCG/2ML IJ SOLN
INTRAMUSCULAR | Status: DC | PRN
  Administered 2020-08-26: 50 ug via INTRAVENOUS

## 2020-08-26 MED ORDER — BELLADONNA ALKALOIDS-OPIUM 16.2-60 MG RE SUPP
RECTAL | Status: AC
  Filled 2020-08-26: qty 1

## 2020-08-26 MED ORDER — IOHEXOL 300 MG/ML  SOLN
INTRAMUSCULAR | Status: DC | PRN
  Administered 2020-08-26: 20 mL via URETHRAL

## 2020-08-26 MED ORDER — PROPOFOL 10 MG/ML IV BOLUS
INTRAVENOUS | Status: AC
  Filled 2020-08-26: qty 40

## 2020-08-26 MED ORDER — ROCURONIUM BROMIDE 100 MG/10ML IV SOLN
INTRAVENOUS | Status: DC | PRN
  Administered 2020-08-26: 40 mg via INTRAVENOUS

## 2020-08-26 MED ORDER — TRAMADOL HCL 50 MG PO TABS
ORAL_TABLET | ORAL | Status: AC
  Filled 2020-08-26: qty 1

## 2020-08-26 MED ORDER — DEXAMETHASONE SODIUM PHOSPHATE 10 MG/ML IJ SOLN
INTRAMUSCULAR | Status: DC | PRN
  Administered 2020-08-26: 5 mg via INTRAVENOUS

## 2020-08-26 MED ORDER — PHENYLEPHRINE 40 MCG/ML (10ML) SYRINGE FOR IV PUSH (FOR BLOOD PRESSURE SUPPORT)
PREFILLED_SYRINGE | INTRAVENOUS | Status: AC
  Filled 2020-08-26: qty 10

## 2020-08-26 SURGICAL SUPPLY — 22 items
BAG DRN RND TRDRP ANRFLXCHMBR (UROLOGICAL SUPPLIES) ×1
BAG URINE DRAIN 2000ML AR STRL (UROLOGICAL SUPPLIES) ×2 IMPLANT
BAG URO CATCHER STRL LF (MISCELLANEOUS) ×2 IMPLANT
CATH FOLEY 2WAY SLVR  5CC 18FR (CATHETERS)
CATH FOLEY 2WAY SLVR  5CC 20FR (CATHETERS) ×2
CATH FOLEY 2WAY SLVR 5CC 18FR (CATHETERS) IMPLANT
CATH FOLEY 2WAY SLVR 5CC 20FR (CATHETERS) ×1 IMPLANT
CATH INTERMIT  6FR 70CM (CATHETERS) ×2 IMPLANT
DRAPE FOOT SWITCH (DRAPES) ×2 IMPLANT
ELECT REM PT RETURN 15FT ADLT (MISCELLANEOUS) ×2 IMPLANT
GLOVE SURG ENC MOIS LTX SZ7.5 (GLOVE) ×2 IMPLANT
GOWN STRL REUS W/TWL XL LVL3 (GOWN DISPOSABLE) ×2 IMPLANT
GUIDEWIRE STR DUAL SENSOR (WIRE) ×1 IMPLANT
KIT TURNOVER KIT A (KITS) ×2 IMPLANT
LOOP CUT BIPOLAR 24F LRG (ELECTROSURGICAL) ×2 IMPLANT
MANIFOLD NEPTUNE II (INSTRUMENTS) ×2 IMPLANT
PACK CYSTO (CUSTOM PROCEDURE TRAY) ×2 IMPLANT
PLUG CATH AND CAP STER (CATHETERS) IMPLANT
SYR TOOMEY IRRIG 70ML (MISCELLANEOUS)
SYRINGE TOOMEY IRRIG 70ML (MISCELLANEOUS) IMPLANT
TUBING CONNECTING 10 (TUBING) ×2 IMPLANT
TUBING UROLOGY SET (TUBING) ×2 IMPLANT

## 2020-08-26 NOTE — Transfer of Care (Signed)
Immediate Anesthesia Transfer of Care Note  Patient: [facility]  Procedure(s) Performed: TRANSURETHRAL RESECTION OF BLADDER TUMOR WITH GEMCITABINE BILATERAL RETROGRADE PYELOGRAM (Bilateral)  Patient Location: PACU  Anesthesia Type:General  Level of Consciousness: drowsy and patient cooperative  Airway & Oxygen Therapy: Patient Spontanous Breathing and Patient connected to face mask oxygen  Post-op Assessment: Report given to RN and Post -op Vital signs reviewed and stable  Post vital signs: Reviewed and stable  Last Vitals:  Vitals Value Taken Time  BP 139/75 08/26/20 1105  Temp    Pulse 73 08/26/20 1108  Resp 19 08/26/20 1108  SpO2 100 % 08/26/20 1108  Vitals shown include unvalidated device data.  Last Pain:  Vitals:   08/26/20 0801  TempSrc:   PainSc: 0-No pain         Complications: No notable events documented.

## 2020-08-26 NOTE — Anesthesia Procedure Notes (Signed)
Procedure Name: LMA Insertion Date/Time: 08/26/2020 10:02 AM Performed by: Lyndal Pulley, CRNA Pre-anesthesia Checklist: Patient identified, Emergency Drugs available, Suction available and Patient being monitored Patient Re-evaluated:Patient Re-evaluated prior to induction Oxygen Delivery Method: Circle system utilized Preoxygenation: Pre-oxygenation with 100% oxygen Induction Type: IV induction Ventilation: Mask ventilation without difficulty LMA: LMA inserted LMA Size: 4.0 Number of attempts: 1 Dental Injury: Teeth and Oropharynx as per pre-operative assessment  Comments: Performed by Loura Pardon

## 2020-08-26 NOTE — Discharge Instructions (Addendum)

## 2020-08-26 NOTE — Op Note (Signed)
Operative Note  Preoperative diagnosis:  1.  Bladder tumor  Postoperative diagnosis: 1.  Bladder tumor--large  Procedure(s): 1.  Transurethral resection of bladder tumor--large 2.  Bilateral retrograde pyelogram  Surgeon: Link Snuffer, MD  Assistants:Yousef Abu-Salha  Anesthesia: General  Complications: None immediate  EBL: Minimal  Specimens: 1.  Bladder tumor  Drains/Catheters: 1.  20 French Foley catheter  Intraoperative findings: 1.  Normal urethra 2.  Large little bit over 5 cm high-grade appearing, muscle invasive appearing bladder tumor on the anterior bladder wall.  This was resected completely superficially and maximally.  She had 2 small bladder tumors one on the left posterior wall and right posterior wall.  Both were resected.  All resection beds were fulgurated. 3.  Bilateral retrograde pyelogram normal without obvious filling defect or tumor.  Indication: 87 year old female with a history of bladder cancer was found to have a recurrence and presents for the previously mentioned operation.  Description of procedure:  The patient was identified and consent was obtained.  The patient was taken to the operating room and placed in the supine position.  The patient was placed under general anesthesia.  Perioperative antibiotics were administered.  The patient was placed in dorsal lithotomy.  Patient was prepped and draped in a standard sterile fashion and a timeout was performed.  A 21 French rigid cystoscope was advanced into the urethra and into the bladder.  Complete cystoscopy was performed with the findings noted above.  The left ureter was cannulated with an open-ended ureteral catheter and a retrograde pyelogram was performed with the findings noted above.  The right ureter was cannulated with an open-ended ureteral catheter and again a retrograde pyelogram was performed with no abnormal findings.  A 26 French resectoscope with a visual obturator in place was  advanced into the urethra and into the bladder.  This was exchanged for the bipolar working element.  We proceeded to resect the bladder tumors of interest.  The large bladder tumor on the anterior wall appeared to be high-grade and invasive.  This was completely resected superficially to what appeared to be bladder muscle fibers.  Resection beds were fulgurated.  Bladder tumor specimens were collected.  There was no evidence of perforation.  However, due to the extent of resection the decision was made to leave a Foley catheter which will remain for a few days.  This concluded the operation.  Patient tolerated the procedure well was stable postoperative.  Plan: Follow-up in few days for voiding trial.  Review pathology at follow-up.

## 2020-08-26 NOTE — H&P (Signed)
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 These were apparently all superficial tumors. According to prior notes, she had upwards of 30 tumors in the bladder and all were completely resected. This was performed by Dr. Thomasene Mohair at [facility]. Last office note from [facility] was on 05/18/2016 at which point pathology was reviewed with the patient. It looks like BCG 6 was recommended. The patient however never had this done. She has not had a cystoscopy since then. She has not had a recent CT scan. Most recent creatinine was 1.22 with a GFR of 43 on 11/23/2017. Since Saturday, the patient has had intermittent gross hematuria. She is on elliquis for a history of atrial fibrillation. Her cardiologist is [name].   01/04/2018:  Patient is status post TURBT with instillation of gemcitabine. She has been doing well. No further gross hematuria. No fever. No voiding complaints. Pathology did reveal high-grade urothelial cell carcinoma with invasion into the lamina propria. There was no muscularis present. I did feel like all tumor was resected.   03/18/2018  Patient had 2 installations of BCG. Unfortunately, she could not tolerate this. She had to be hospitalized for flulike symptoms and fever. She presents today for cystoscopy.   06/16/2018  No interval hematuria or dysuria. As noted above, she could not tolerate BCG. She presents today for surveillance cystoscopy.   09/16/2018  Patient presents for surveillance cystoscopy. As stated above, she could not tolerate BCG. She will be due for upper tract imaging in about 3 months. No interval gross hematuria.   12/22/2018  Patient underwent a CT IVP. This revealed no evidence of bladder tumor recurrence. No evidence of upper tract tumor. She is here for surveillance cystoscopy.   10/12/2019  Patient presents  today for surveillance cystoscopy. Cysto demonstrated small superficial tumor on posterior left aspect of bladder.   11/14/2019: She presents for cysto/fulguration in the office. No recent hematuria.   08/08/2020  Patient presents for surveillance cystoscopy. She is continuing to have some problems with incontinence without sensation. She has a lot of incontinence at night and when she stands. Myrbetriq is not really helping. she denies interval hematuria.     ALLERGIES: None   MEDICATIONS: Myrbetriq  Acetaminophen  Acidophilus  Albuterol Sulfate  Breo Ellipta  Diltiazem 24Hr Er (Cd) 120 mg capsule, ext release 24 hr  Eliquis 5 mg tablet  Fluticasone Propionate  Furosemide 20 mg tablet  Montelukast Sodium  Pantoprazole Sodium 40 mg tablet, delayed release  Symbicort  Tramadol Hcl 50 mg tablet  Triamcinolone Acetonide 0.025 % cream     GU PSH: Bladder Instill AntiCA Agent - 2020, 2020, 2019 Cystoscopy - 02/20/2020, 10/12/2019, 07/11/2019, 12/22/2018, 09/16/2018, 2020, 2020, 2019 Cystoscopy Fulguration - 11/14/2019, 02/23/2019 Cystoscopy TURBT 2-5 cm - 2019 Locm 300-'399Mg'$ /Ml Iodine,1Ml - 12/19/2018, 12/19/2018, 2019 Ovary Removal Partial or Total, Right     NON-GU PSH: Anesth, Bladder Surgery Appendectomy (laparoscopic) Cholecystectomy (laparoscopic)     GU PMH: Bladder Cancer overlapping sites - 02/20/2020, - 11/14/2019, - 10/12/2019, - 2019 Gross hematuria - 11/14/2019, - 2019 History of bladder cancer    NON-GU PMH: Arthritis Asthma Atrial Fibrillation Congestive heart failure COPD    FAMILY HISTORY: stroke - Father   SOCIAL HISTORY: Marital Status: Divorced Preferred Language: English; Race: White Current Smoking Status: Patient does not smoke anymore.   Tobacco Use  Assessment Completed: Used Tobacco in last 30 days? Drinks 2 caffeinated drinks per day.     Notes: sons, 2 daughters   REVIEW OF SYSTEMS:    GU Review Female:   Patient denies frequent urination, hard  to postpone urination, burning /pain with urination, get up at night to urinate, leakage of urine, stream starts and stops, trouble starting your stream, have to strain to urinate, and being pregnant.  Gastrointestinal (Upper):   Patient denies nausea, vomiting, and indigestion/ heartburn.  Gastrointestinal (Lower):   Patient denies diarrhea and constipation.  Constitutional:   Patient denies fever, night sweats, weight loss, and fatigue.  Skin:   Patient denies skin rash/ lesion and itching.  Eyes:   Patient denies blurred vision and double vision.  Ears/ Nose/ Throat:   Patient denies sore throat and sinus problems.  Hematologic/Lymphatic:   Patient denies swollen glands and easy bruising.  Cardiovascular:   Patient denies leg swelling and chest pains.  Respiratory:   Patient denies cough and shortness of breath.  Endocrine:   Patient denies excessive thirst.  Musculoskeletal:   Patient denies back pain and joint pain.  Neurological:   Patient denies headaches and dizziness.  Psychologic:   Patient denies depression and anxiety.   VITAL SIGNS: None   Complexity of Data:  Source Of History:  Patient  Records Review:   Previous Doctor Records, Previous Patient Records  Urine Test Review:   Urinalysis   PROCEDURES:         Flexible Cystoscopy - 52000  Risks, benefits, and the potential complications of the procedure were discussed with the patient including infection, bleeding, voiding discomfort, urinary retention, etc. All questions were answered. Consent was obtained. Sterile technique and intraurethral analgesia were used.  Meatus:  Normal size. Normal location. Normal condition.  Urethra:  No hypermobility. No leakage.  Ureteral Orifices:  Normal location. Normal size. Normal shape.   Bladder:  No trabeculation. approximately 3-4 cm bladder tumor on the anterior bladder wall. Tiny sub cm papillary lesion on the left posterior wall      The procedure was well-tolerated and without  complications. Instructions were given to call the office if she developed any problems. The patient stated that she understood these instructions.         Urinalysis w/Scope Dipstick Dipstick Cont'd Micro  Color: Yellow Bilirubin: Neg mg/dL WBC/hpf: 0 - 5/hpf  Appearance: Clear Ketones: Neg mg/dL RBC/hpf: 0 - 2/hpf  Specific Gravity: 1.025 Blood: Trace ery/uL Bacteria: NS (Not Seen)  pH: 5.5 Protein: 1+ mg/dL Cystals: NS (Not Seen)  Glucose: Neg mg/dL Urobilinogen: 0.2 mg/dL Casts: NS (Not Seen)    Nitrites: Neg Trichomonas: Not Present    Leukocyte Esterase: Trace leu/uL Mucous: Not Present      Epithelial Cells: 0 - 5/hpf      Yeast: NS (Not Seen)      Sperm: Not Present    Notes: microscopic not concentrated    ASSESSMENT:      ICD-10 Details  1 GU:   Bladder Cancer overlapping sites - C67.8 Chronic, Worsening     PLAN:           Orders Labs Urine Culture          Document Letter(s):  Created for Patient: Clinical Summary         Notes:   plan for TURBT with instillation of gemcitabine. Also plan for bilateral retrograde pyelogram. Risks and benefits discussed. She will need clearance from her cardiologist.   trial  of gemtesa. One month of samples provided   Cc: [name]    Signed by Link Snuffer, III, M.D. on 08/08/20 at 11:46 AM (EDT

## 2020-08-26 NOTE — Interval H&P Note (Signed)
History and Physical Interval Note:  08/26/2020 9:13 AM  [facility]  has presented today for surgery, with the diagnosis of BLADDER TUMOR.  The various methods of treatment have been discussed with the patient and family. After consideration of risks, benefits and other options for treatment, the patient has consented to  Procedure(s): TRANSURETHRAL RESECTION OF BLADDER TUMOR WITH GEMCITABINE BILATERAL RETROGRADE PYELOGRAM (Bilateral) as a surgical intervention.  The patient's history has been reviewed, patient examined, no change in status, stable for surgery.  I have reviewed the patient's chart and labs.  Questions were answered to the patient's satisfaction.     Marton Redwood, III

## 2020-08-26 NOTE — Anesthesia Postprocedure Evaluation (Signed)
Anesthesia Post Note  Patient: [facility]  Procedure(s) Performed: TRANSURETHRAL RESECTION OF BLADDER TUMOR WITH GEMCITABINE BILATERAL RETROGRADE PYELOGRAM (Bilateral)     Patient location during evaluation: PACU Anesthesia Type: General Level of consciousness: awake and alert and oriented Pain management: pain level controlled Vital Signs Assessment: post-procedure vital signs reviewed and stable Respiratory status: spontaneous breathing, nonlabored ventilation and respiratory function stable Cardiovascular status: blood pressure returned to baseline and stable Postop Assessment: no apparent nausea or vomiting Anesthetic complications: no   No notable events documented.  Last Vitals:  Vitals:   08/26/20 1150 08/26/20 1200  BP:  (!) 152/95  Pulse: 92 92  Resp: 14 19  Temp:    SpO2: 95% 90%    Last Pain:  Vitals:   08/26/20 1150  TempSrc:   PainSc: 6                  Laiken Sandy A.

## 2020-08-27 ENCOUNTER — Encounter (HOSPITAL_COMMUNITY): Payer: Self-pay | Admitting: Urology

## 2020-08-27 LAB — SURGICAL PATHOLOGY

## 2020-08-31 ENCOUNTER — Other Ambulatory Visit: Payer: Self-pay

## 2020-08-31 ENCOUNTER — Encounter (HOSPITAL_BASED_OUTPATIENT_CLINIC_OR_DEPARTMENT_OTHER): Payer: Self-pay | Admitting: Pharmacy Technician

## 2020-08-31 ENCOUNTER — Emergency Department (HOSPITAL_BASED_OUTPATIENT_CLINIC_OR_DEPARTMENT_OTHER)
Admission: EM | Admit: 2020-08-31 | Discharge: 2020-08-31 | Disposition: A | Discharge status: Home / Self Care | Payer: Medicare Other | Attending: Emergency Medicine | Admitting: Emergency Medicine

## 2020-08-31 DIAGNOSIS — I110 Hypertensive heart disease with heart failure: Secondary | ICD-10-CM | POA: Diagnosis not present

## 2020-08-31 DIAGNOSIS — K5900 Constipation, unspecified: Secondary | ICD-10-CM | POA: Diagnosis not present

## 2020-08-31 DIAGNOSIS — J45909 Unspecified asthma, uncomplicated: Secondary | ICD-10-CM | POA: Diagnosis not present

## 2020-08-31 DIAGNOSIS — Z8551 Personal history of malignant neoplasm of bladder: Secondary | ICD-10-CM | POA: Insufficient documentation

## 2020-08-31 DIAGNOSIS — R531 Weakness: Secondary | ICD-10-CM | POA: Diagnosis not present

## 2020-08-31 DIAGNOSIS — R5383 Other fatigue: Secondary | ICD-10-CM | POA: Insufficient documentation

## 2020-08-31 DIAGNOSIS — J449 Chronic obstructive pulmonary disease, unspecified: Secondary | ICD-10-CM | POA: Insufficient documentation

## 2020-08-31 DIAGNOSIS — Z7951 Long term (current) use of inhaled steroids: Secondary | ICD-10-CM | POA: Insufficient documentation

## 2020-08-31 DIAGNOSIS — I4891 Unspecified atrial fibrillation: Secondary | ICD-10-CM | POA: Insufficient documentation

## 2020-08-31 DIAGNOSIS — Z7901 Long term (current) use of anticoagulants: Secondary | ICD-10-CM | POA: Diagnosis not present

## 2020-08-31 DIAGNOSIS — R319 Hematuria, unspecified: Secondary | ICD-10-CM | POA: Diagnosis not present

## 2020-08-31 DIAGNOSIS — I5033 Acute on chronic diastolic (congestive) heart failure: Secondary | ICD-10-CM | POA: Diagnosis not present

## 2020-08-31 DIAGNOSIS — Z87891 Personal history of nicotine dependence: Secondary | ICD-10-CM | POA: Diagnosis not present

## 2020-08-31 LAB — CBC WITH DIFFERENTIAL/PLATELET
Abs Immature Granulocytes: 0.06 10*3/uL (ref 0.00–0.07)
Basophils Absolute: 0 10*3/uL (ref 0.0–0.1)
Basophils Relative: 0 %
Eosinophils Absolute: 0.1 10*3/uL (ref 0.0–0.5)
Eosinophils Relative: 1 %
HCT: 39.8 % (ref 36.0–46.0)
Hemoglobin: 13.2 g/dL (ref 12.0–15.0)
Immature Granulocytes: 1 %
Lymphocytes Relative: 18 %
Lymphs Abs: 1.8 10*3/uL (ref 0.7–4.0)
MCH: 28.4 pg (ref 26.0–34.0)
MCHC: 33.2 g/dL (ref 30.0–36.0)
MCV: 85.8 fL (ref 80.0–100.0)
Monocytes Absolute: 1 10*3/uL (ref 0.1–1.0)
Monocytes Relative: 10 %
Neutro Abs: 7.1 10*3/uL (ref 1.7–7.7)
Neutrophils Relative %: 70 %
Platelets: 290 10*3/uL (ref 150–400)
RBC: 4.64 MIL/uL (ref 3.87–5.11)
RDW: 15.2 % (ref 11.5–15.5)
WBC: 10.1 10*3/uL (ref 4.0–10.5)
nRBC: 0 % (ref 0.0–0.2)

## 2020-08-31 LAB — BASIC METABOLIC PANEL
Anion gap: 9 (ref 5–15)
BUN: 23 mg/dL (ref 8–23)
CO2: 28 mmol/L (ref 22–32)
Calcium: 8.8 mg/dL — ABNORMAL LOW (ref 8.9–10.3)
Chloride: 102 mmol/L (ref 98–111)
Creatinine, Ser: 0.88 mg/dL (ref 0.44–1.00)
GFR, Estimated: >60 mL/min (ref >60)
Glucose, Bld: 103 mg/dL — ABNORMAL HIGH (ref 70–99)
Potassium: 3.6 mmol/L (ref 3.5–5.1)
Sodium: 139 mmol/L (ref 135–145)

## 2020-08-31 MED ORDER — SODIUM CHLORIDE 0.9 % IV BOLUS
1000.0000 mL | Freq: Once | INTRAVENOUS | Status: AC
  Administered 2020-08-31: 1000 mL via INTRAVENOUS

## 2020-08-31 NOTE — ED Triage Notes (Signed)
Pt here via POV with reports of having a bladder resection and tumor removal earlier this week and today she started having blood in her urine after straining to have a bowel movement. Pt family states pt has been weaker than normal. Pt with hx of afib on eliquis.

## 2020-08-31 NOTE — ED Provider Notes (Signed)
[facility] EMERGENCY DEPARTMENT Provider Note   CSN: [id number] Arrival date & time: 08/31/20  [id number]     History No chief complaint on file.   Crystal Kerr is a 87 y.o. female.  87 yo F with a chief complaints of weakness.  Patient had a bladder tumor removed 5 days ago.  Was supposed to have her Foley catheter removed by the urology office but was unable to wait long enough to have it done in the office and so left yesterday.  She is here today to have her catheter removed.  She has felt more fatigued than normal.  Typically gets fairly fatigued with her A. fib.  Has had some hematuria that actually has improved a bit.  Denies any difficulty with urination.  Has been a bit constipated.  Denies any abdominal pain.  Denies fevers.  The history is provided by the patient.  Illness Severity:  Moderate Onset quality:  Gradual Duration:  2 days Timing:  Constant Progression:  Worsening Chronicity:  New Associated symptoms: no chest pain, no congestion, no fever, no headaches, no myalgias, no nausea, no rhinorrhea, no shortness of breath, no vomiting and no wheezing       Past Medical History:  Diagnosis Date   Arthritis    Asthma    Atrial fibrillation ([place])    a. s/p DCCV in 2015, on Amiodarone and Eliquis   CHF (congestive heart failure) (HCC)    COPD (chronic obstructive pulmonary disease) (HCC)    Dysrhythmia    afib -hx    Hypoglycemia    Neuromuscular disorder ([place])    peripheral neuropathy    Peripheral neuropathy    hands and feet   Pneumonia    hx of x 2    PVC's (premature ventricular contractions)    Unstable gait     Patient Active Problem List   Diagnosis Date Noted   Hypertension 08/15/2019   Secondary hypercoagulable state ([place]) 12/20/2018   Bladder cancer ([place]) 07/26/2018   Renal insufficiency 02/10/2018   Chronic diastolic heart failure ([place]) 11/23/2017   ILD (interstitial lung disease) ([place]) 11/23/2017   Current use of long  term anticoagulation 07/31/2016   Atypical pneumonia 06/02/2016   History of atrial fibrillation 06/02/2016   Lower extremity edema 12/19/2013   Deafness or hearing loss of type classifiable to 389.0 with type classifiable to 389.1 10/27/2013   Benign paroxysmal positional vertigo 09/13/2013   Upper airway cough syndrome 07/10/2013   Allergic rhinitis 07/05/2013   Persistent atrial fibrillation ([place]) 03/30/2013   Shortness of breath 02/06/2013   Acute on chronic diastolic congestive heart failure ([place]) 02/04/2013   Arthritis of spine 07/01/2012   Intrinsic asthma 07/01/2012   Dyspnea 04/12/2012   PVC's (premature ventricular contractions) 04/24/2011   Murmur, cardiac 04/24/2011    Past Surgical History:  Procedure Laterality Date   APPENDECTOMY     CARDIOVERSION N/A 03/08/2013   Procedure: CARDIOVERSION;  Surgeon: Larey Dresser, MD;  Location: [facility];  Service: Cardiovascular;  Laterality: N/A;   CARDIOVERSION N/A 07/05/2019   Procedure: CARDIOVERSION;  Surgeon: Sueanne Margarita, MD;  [facility];  Service: Cardiovascular;  Laterality: N/A;   CHOLECYSTECTOMY     oophrectomy     TEE WITHOUT CARDIOVERSION N/A 03/08/2013   Procedure: TRANSESOPHAGEAL ECHOCARDIOGRAM (TEE);  Surgeon: Larey Dresser, MD;  Location: [facility];  Service: Cardiovascular;  Laterality: N/A;   TONSILLECTOMY     TRANSURETHRAL RESECTION OF BLADDER TUMOR WITH GYRUS (TURBT-GYRUS)  05/13/2016  urology wiht [facility]   TRANSURETHRAL RESECTION OF BLADDER TUMOR WITH MITOMYCIN-C N/A 12/24/2017   Procedure: TRANSURETHRAL RESECTION OF BLADDER TUMOR;  Surgeon: Lucas Mallow, MD;  Location: [facility];  Service: Urology;  Laterality: N/A;   TRANSURETHRAL RESECTION OF BLADDER TUMOR WITH MITOMYCIN-C Bilateral 08/26/2020   Procedure: TRANSURETHRAL RESECTION OF BLADDER TUMOR WITH GEMCITABINE BILATERAL RETROGRADE PYELOGRAM;  Surgeon: Lucas Mallow, MD;  [facility];  Service: Urology;  Laterality:  Bilateral;     OB History   No obstetric history on file.     Family History  Problem Relation Age of Onset   Heart failure Mother    Diabetes Father    Heart disease Other        No family history    Social History   Tobacco Use   Smoking status: Former    Packs/day: 1.00    Years: 20.00    Pack years: 20.00    Types: Cigarettes    Quit date: 04/13/1981    Years since quitting: 39.4   Smokeless tobacco: Never  Vaping Use   Vaping Use: Never used  Substance Use Topics   Alcohol use: Not Currently    Comment: Occasional   Drug use: No    Home Medications Prior to Admission medications   Medication Sig Start Date End Date Taking? Authorizing Provider  acetaminophen (TYLENOL) 325 MG tablet Take 2 tablets (650 mg total) by mouth every 6 (six) hours as needed for mild pain or headache. 06/05/16   Raiford Noble Latif, DO  albuterol [facility] HFA) 108 (90 Base) MCG/ACT inhaler Inhale 2 puffs into the lungs every 4 (four) hours as needed for wheezing or shortness of breath. 02/15/18   Reyne Dumas, MD  apixaban (ELIQUIS) 5 MG TABS tablet Take 1 tablet (5 mg total) by mouth 2 (two) times daily. 08/31/20   Lucas Mallow, MD  chlorpheniramine (CHLOR-TRIMETON) 4 MG tablet Take 4 mg by mouth 2 (two) times daily as needed for allergies.    [provider]  diclofenac Sodium (VOLTAREN) 1 % GEL Apply 1 application topically 2 (two) times daily as needed (pain).    [provider]  diltiazem (TIAZAC) 120 MG 24 hr capsule TAKE 1 CAPSULE BY MOUTH DAILY 04/30/20   Lelon Perla, MD  fluticasone furoate-vilanterol (BREO ELLIPTA) 200-25 MCG/INH AEPB Inhale 1 puff into the lungs daily. NEEDS OFFICE VISIT 06/14/20   Chesley Mires, MD  furosemide (LASIX) 20 MG tablet Take 1 tablet (20 mg total) by mouth daily. Use as needed for swelling 06/28/19   Copland, Gay Filler, MD  mirabegron ER (MYRBETRIQ) 50 MG TB24 tablet Take 1 tablet (50 mg total) by mouth daily. 04/22/20   Copland,  Gay Filler, MD  montelukast (SINGULAIR) 10 MG tablet Take 1 tablet (10 mg total) by mouth daily. 06/14/20   Chesley Mires, MD  Multiple Vitamins-Minerals (MULTIVITAMIN WITH MINERALS) tablet Take 1 tablet by mouth daily.    [provider]  traMADol (ULTRAM) 50 MG tablet Take 1 tablet (50 mg total) by mouth every 12 (twelve) hours as needed (pain). 08/26/20   Lucas Mallow, MD    Allergies    Patient has no known allergies.  Review of Systems   Review of Systems  Constitutional:  Negative for chills and fever.  HENT:  Negative for congestion and rhinorrhea.   Eyes:  Negative for redness and visual disturbance.  Respiratory:  Negative for shortness of breath and wheezing.  Cardiovascular:  Negative for chest pain and palpitations.  Gastrointestinal:  Negative for nausea and vomiting.  Genitourinary:  Positive for hematuria. Negative for dysuria and urgency.  Musculoskeletal:  Negative for arthralgias and myalgias.  Skin:  Negative for pallor and wound.  Neurological:  Negative for dizziness and headaches.   Physical Exam Updated Vital Signs BP 139/77   Pulse 100   Temp 98.2 F (36.8 C)   Resp (!) 21   SpO2 95%   Physical Exam Vitals and nursing note reviewed.  Constitutional:      General: She is not in acute distress.    Appearance: She is well-developed. She is not diaphoretic.  HENT:     Head: Normocephalic and atraumatic.  Eyes:     Pupils: Pupils are equal, round, and reactive to light.  Cardiovascular:     Rate and Rhythm: Normal rate and regular rhythm.     Heart sounds: No murmur heard.   No friction rub. No gallop.  Pulmonary:     Effort: Pulmonary effort is normal.     Breath sounds: No wheezing or rales.  Abdominal:     General: There is no distension.     Palpations: Abdomen is soft.     Tenderness: There is no abdominal tenderness.  Musculoskeletal:        General: No tenderness.     Cervical back: Normal range of motion and neck supple.   Skin:    General: Skin is warm and dry.  Neurological:     Mental Status: She is alert and oriented to person, place, and time.  Psychiatric:        Behavior: Behavior normal.    ED Results / Procedures / Treatments   Labs (all labs ordered are listed, but only abnormal results are displayed) Labs Reviewed  BASIC METABOLIC PANEL - Abnormal; Notable for the following components:      Result Value   Glucose, Bld 103 (*)    Calcium 8.8 (*)    All other components within normal limits  CBC WITH DIFFERENTIAL/PLATELET    EKG None  Radiology No results found.  Procedures Procedures   Medications Ordered in ED Medications  sodium chloride 0.9 % bolus 1,000 mL (1,000 mLs Intravenous New Bag/Given 08/31/20 [id number])    ED Course  I have reviewed the triage vital signs and the nursing notes.  Pertinent labs & imaging results that were available during my care of the patient were reviewed by me and considered in my medical decision making (see chart for details).    MDM Rules/Calculators/A&P                           87 yo F with a cc of fatigue.  Going on for couple days.  She also would like to have her Foley catheter removed.  She finds it to be very irritating.  She had followed up with her urology office yesterday to have it removed but unfortunately was unable to wait long enough to have it performed.  I discussed risks and benefits of removal here with her.  She would like to try to have it taken out.  We will obtain basic blood work with her fatigue and hematuria.  labwork is resulted without anemia no significant electrolyte abnormality no renal dysfunction.  Patient has been able to urinate here and is requesting discharge prior to the resolution of her fluid bolus.  We will have her follow-up with  her urologist.  9:50 AM:  I have discussed the diagnosis/risks/treatment options with the patient and family and believe the pt to be eligible for discharge home to follow-up  with PCP, urology. We also discussed returning to the ED immediately if new or worsening sx occur. We discussed the sx which are most concerning (e.g., sudden worsening pain, fever, inability to tolerate by mouth) that necessitate immediate return. Medications administered to the patient during their visit and any new prescriptions provided to the patient are listed below.  Medications given during this visit Medications  sodium chloride 0.9 % bolus 1,000 mL (1,000 mLs Intravenous New Bag/Given 08/31/20 [id number])     The patient appears reasonably screen and/or stabilized for discharge and I doubt any other medical condition or other [facility] requiring further screening, evaluation, or treatment in the ED at this time prior to discharge.   Final Clinical Impression(s) / ED Diagnoses Final diagnoses:  Fatigue, unspecified type    Rx / DC Orders ED Discharge Orders     None        Deno Etienne, DO 08/31/20 [phone]

## 2020-08-31 NOTE — Discharge Instructions (Addendum)
Call your urologist and family doctor on Monday and let them know how you are doing.  Try and eat and drink as well as you can for the next 48 hours.  Please return for worsening fatigue inability urinate pain with urination fever or side pain.

## 2020-09-03 DIAGNOSIS — C678 Malignant neoplasm of overlapping sites of bladder: Secondary | ICD-10-CM | POA: Diagnosis not present

## 2020-09-03 DIAGNOSIS — R310 Gross hematuria: Secondary | ICD-10-CM | POA: Diagnosis not present

## 2020-09-06 ENCOUNTER — Telehealth: Payer: Self-pay | Admitting: Oncology

## 2020-09-06 NOTE — Telephone Encounter (Signed)
Received a new pt referral from [name] for bladder cancer. Crystal Kerr has been scheduled to see Dr. Alen Blew on 9/1 at 2pm. Appt date and time has been given to the pt's daughter. Aware to arrive 15 minutes early.

## 2020-09-08 ENCOUNTER — Other Ambulatory Visit: Payer: Self-pay | Admitting: Cardiology

## 2020-09-09 NOTE — Telephone Encounter (Signed)
Prescription refill request for Eliquis received. Indication:atrial fib Last office visit:6/22 Scr:0.8 Age: 87 Weight:70.3 kg  Prescription refilled

## 2020-09-12 ENCOUNTER — Inpatient Hospital Stay: Payer: Medicare Other | Attending: Oncology | Admitting: Oncology

## 2020-09-12 ENCOUNTER — Other Ambulatory Visit: Payer: Self-pay

## 2020-09-12 VITALS — BP 108/64 | HR 91 | Temp 98.9°F | Resp 18 | Wt 152.7 lb

## 2020-09-12 DIAGNOSIS — C679 Malignant neoplasm of bladder, unspecified: Secondary | ICD-10-CM

## 2020-09-12 NOTE — Progress Notes (Signed)
Reason for the request:    Bladder cancer  HPI: I was asked by [name] to evaluate Crystal Kerr for the evaluation of bladder cancer.  This is a 87 year old woman with history of COPD, CHF atrial fibrillation as well as bladder cancer.  He was followed to [facility] previously for superficial bladder tumor and it was treated with BCG previously.  She established care in 2019 with [name] and underwent TURBT and instillation of gemcitabine at that time.  She had repeat to BCG treatment in 2020 that was poorly tolerated.  She has been on active surveillance since that time.  Her most recent surveillance cystoscopy showed recurrent tumor in July 2022.  She underwent TURBT on August 26, 2020.  She was found to have 5 cm high-grade appearing tumor in the anterior bladder wall that was resected at that time.  The final pathology showed invasive, high-grade urothelial carcinoma with muscle invasion.  She is scheduled to have staging scan in September 2022.  She was referred by [name] for evaluation regarding alternative treatment options.  She is also referred to see Dr. Tammi Klippel regarding possible radiation alternative.   Clinically, she reports no other complaints at this time.  She denies any hematochezia or melena.  She denies any hematuria.  She does report generalized fatigue overall weakness with limited quality of life.  She does ambulate with a walker and lives with her daughter.  She does report dyspnea on exertion and palpitation associated with her COPD and CHF.   She does not report any headaches, blurry vision, syncope or seizures. Does not report any fevers, chills or sweats.  Does not report any cough, wheezing or hemoptysis.  Does not report any chest pain, palpitation, orthopnea or leg edema.  Does not report any nausea, vomiting or abdominal pain.  Does not report any constipation or diarrhea.  Does not report any skeletal complaints.    Does not report frequency, urgency or hematuria.  Does not  report any skin rashes or lesions. Does not report any heat or cold intolerance.  Does not report any lymphadenopathy or petechiae.  Does not report any anxiety or depression.  Remaining review of systems is negative.     Past Medical History:  Diagnosis Date   Arthritis    Asthma    Atrial fibrillation ([place])    a. s/p DCCV in 2015, on Amiodarone and Eliquis   CHF (congestive heart failure) (HCC)    COPD (chronic obstructive pulmonary disease) (HCC)    Dysrhythmia    afib -hx    Hypoglycemia    Neuromuscular disorder (H[facility])    peripheral neuropathy    Peripheral neuropathy    hands and feet   Pneumonia    hx of x 2    PVC's (premature ventricular contractions)    Unstable gait   :   Past Surgical History:  Procedure Laterality Date   APPENDECTOMY     CARDIOVERSION N/A 03/08/2013   Procedure: CARDIOVERSION;  Surgeon: Larey Dresser, MD;  Location: [facility];  Service: Cardiovascular;  Laterality: N/A;   CARDIOVERSION N/A 07/05/2019   Procedure: CARDIOVERSION;  Surgeon: Sueanne Margarita, MD;  Location: [facility];  Service: Cardiovascular;  Laterality: N/A;   CHOLECYSTECTOMY     oophrectomy     TEE WITHOUT CARDIOVERSION N/A 03/08/2013   Procedure: TRANSESOPHAGEAL ECHOCARDIOGRAM (TEE);  Surgeon: Larey Dresser, MD;  Location: [facility];  Service: Cardiovascular;  Laterality: N/A;   TONSILLECTOMY     TRANSURETHRAL RESECTION  OF BLADDER TUMOR WITH GYRUS (TURBT-GYRUS)  05/13/2016   urology wiht [facility]   TRANSURETHRAL RESECTION OF BLADDER TUMOR WITH MITOMYCIN-C N/A 12/24/2017   Procedure: TRANSURETHRAL RESECTION OF BLADDER TUMOR;  Surgeon: Lucas Mallow, MD;  [facility];  Service: Urology;  Laterality: N/A;   TRANSURETHRAL RESECTION OF BLADDER TUMOR WITH MITOMYCIN-C Bilateral 08/26/2020   Procedure: TRANSURETHRAL RESECTION OF BLADDER TUMOR WITH GEMCITABINE BILATERAL RETROGRADE PYELOGRAM;  Surgeon: Lucas Mallow, MD;  [facility];  Service: Urology;   Laterality: Bilateral;  :   Current Outpatient Medications:    acetaminophen (TYLENOL) 325 MG tablet, Take 2 tablets (650 mg total) by mouth every 6 (six) hours as needed for mild pain or headache., Disp: 30 tablet, Rfl: 0   albuterol (PROAIR HFA) 108 (90 Base) MCG/ACT inhaler, Inhale 2 puffs into the lungs every 4 (four) hours as needed for wheezing or shortness of breath., Disp: 18 g, Rfl: 11   chlorpheniramine (CHLOR-TRIMETON) 4 MG tablet, Take 4 mg by mouth 2 (two) times daily as needed for allergies., Disp: , Rfl:    diclofenac Sodium (VOLTAREN) 1 % GEL, Apply 1 application topically 2 (two) times daily as needed (pain)., Disp: , Rfl:    diltiazem (TIAZAC) 120 MG 24 hr capsule, TAKE 1 CAPSULE BY MOUTH DAILY, Disp: 90 capsule, Rfl: 2   ELIQUIS 5 MG TABS tablet, TAKE 1 TABLET(5 MG) BY MOUTH TWICE DAILY, Disp: 180 tablet, Rfl: 1   fluticasone furoate-vilanterol (BREO ELLIPTA) 200-25 MCG/INH AEPB, Inhale 1 puff into the lungs daily. NEEDS OFFICE VISIT, Disp: 90 each, Rfl: 3   furosemide (LASIX) 20 MG tablet, Take 1 tablet (20 mg total) by mouth daily. Use as needed for swelling, Disp: 90 tablet, Rfl: 3   mirabegron ER (MYRBETRIQ) 50 MG TB24 tablet, Take 1 tablet (50 mg total) by mouth daily., Disp: 90 tablet, Rfl: 3   montelukast (SINGULAIR) 10 MG tablet, Take 1 tablet (10 mg total) by mouth daily., Disp: 90 tablet, Rfl: 3   Multiple Vitamins-Minerals (MULTIVITAMIN WITH MINERALS) tablet, Take 1 tablet by mouth daily., Disp: , Rfl:    traMADol (ULTRAM) 50 MG tablet, Take 1 tablet (50 mg total) by mouth every 12 (twelve) hours as needed (pain)., Disp: 60 tablet, Rfl: 0:  No Known Allergies:   Family History  Problem Relation Age of Onset   Heart failure Mother    Diabetes Father    Heart disease Other        No family history  :   Social History   Socioeconomic History   Marital status: Divorced    Spouse name: Not on file   Number of children: Not on file   Years of education: Not  on file   Highest education level: Not on file  Occupational History   Occupation: Retired    Fish farm manager: RETIRED  Tobacco Use   Smoking status: Former    Packs/day: 1.00    Years: 20.00    Pack years: 20.00    Types: Cigarettes    Quit date: 04/13/1981    Years since quitting: 39.4   Smokeless tobacco: Never  Vaping Use   Vaping Use: Never used  Substance and Sexual Activity   Alcohol use: Not Currently    Comment: Occasional   Drug use: No   Sexual activity: Never  Other Topics Concern   Not on file  Social History Narrative   Not on file   Social Determinants of Health   Financial Resource Strain:  Not on file  Food Insecurity: Not on file  Transportation Needs: Not on file  Physical Activity: Not on file  Stress: Not on file  Social Connections: Not on file  Intimate Partner Violence: Not on file  :  Pertinent items are noted in HPI.  Exam: Blood pressure 108/64, pulse 91, temperature 98.9 F (37.2 C), resp. rate 18, weight 152 lb 11.2 oz (69.3 kg), SpO2 99 %. ECOG 2 General appearance: alert and cooperative appeared without distress. Head: atraumatic without any abnormalities. Eyes: conjunctivae/corneas clear. PERRL.  Sclera anicteric. Throat: lips, mucosa, and tongue normal; without oral thrush or ulcers. Resp: clear to auscultation bilaterally without rhonchi, wheezes or dullness to percussion. Cardio: regular rate and rhythm, S1, S2 normal, no murmur, click, rub or gallop GI: soft, non-tender; bowel sounds normal; no masses,  no organomegaly Skin: Skin color, texture, turgor normal. No rashes or lesions Lymph nodes: Cervical, supraclavicular, and axillary nodes normal. Neurologic: Grossly normal without any motor, sensory or deep tendon reflexes. Musculoskeletal: No joint deformity or effusion.    Assessment and Plan:    87 year old with:  1.  High-grade urothelial carcinoma of the bladder with muscle invasion documented in August 2022 with clinical  stage at least T2.  Staging scan is currently pending tentatively scheduled for September 19, 2020.  He had history of superficial bladder tumor previously was treated with BCG on few occasions.  The natural course of this disease and treatment options were discussed at this time.  Assuming she does not have any metastatic disease, curative options in the form of radical cystectomy is not an option given her age and comorbidities.  Definitive therapy with radiation with chemotherapy would be a reasonable option alternative to achieve curative treatment without radical surgery.  The rationale for using chemotherapy concurrently with radiation were discussed at this time.  Platinum agents versus gemcitabine were discussed.  Complications associated with these treatments in the setting of radiation were reviewed.  These include nausea, vomiting, myelosuppression, neutropenia and rarely sepsis.  Alternatively palliative radiation therapy alone could also be an option to palliate her disease without chemotherapy if she opts against chemotherapy.  Supportive care only with palliative resection periodically would be also an an option if she opts against any treatment.  After discussion today, she is concerned about her quality of life while she is receiving 6 weeks of chemotherapy and radiation.  She would be a marginal candidate to handle this therapy at this point.  She may be interested in a shorter course of radiation versus active surveillance only and palliative resection periodically.  She also understands that palliative radiation therapy can help also in the future if she develops any symptoms.  I urged her to complete her consultation with Dr. Tammi Klippel in the near future to listen about the palliative radiation option and a short course of radiation.  If she opts against that, then surveillance cystoscopy would be her only choice.  She also understands if her disease recurs hospice could also be an  option for her moving forward.   2.  Follow-up: I am happy to see her in the future as needed.    60  minutes were dedicated to this visit. The time was spent on reviewing laboratory data, imaging studies, discussing treatment options, and answering questions regarding future plan.     A copy of this consult has been forwarded to the requesting physician.

## 2020-09-19 ENCOUNTER — Encounter (HOSPITAL_BASED_OUTPATIENT_CLINIC_OR_DEPARTMENT_OTHER): Payer: Self-pay

## 2020-09-19 ENCOUNTER — Telehealth: Payer: Self-pay | Admitting: *Deleted

## 2020-09-19 ENCOUNTER — Emergency Department (HOSPITAL_BASED_OUTPATIENT_CLINIC_OR_DEPARTMENT_OTHER)
Admission: EM | Admit: 2020-09-19 | Discharge: 2020-09-19 | Disposition: A | Discharge status: Home / Self Care | Payer: Medicare Other | Attending: Emergency Medicine | Admitting: Emergency Medicine

## 2020-09-19 ENCOUNTER — Other Ambulatory Visit: Payer: Self-pay

## 2020-09-19 ENCOUNTER — Emergency Department (HOSPITAL_BASED_OUTPATIENT_CLINIC_OR_DEPARTMENT_OTHER): Payer: Medicare Other

## 2020-09-19 DIAGNOSIS — Z7901 Long term (current) use of anticoagulants: Secondary | ICD-10-CM | POA: Insufficient documentation

## 2020-09-19 DIAGNOSIS — I110 Hypertensive heart disease with heart failure: Secondary | ICD-10-CM | POA: Insufficient documentation

## 2020-09-19 DIAGNOSIS — R109 Unspecified abdominal pain: Secondary | ICD-10-CM | POA: Diagnosis not present

## 2020-09-19 DIAGNOSIS — I5032 Chronic diastolic (congestive) heart failure: Secondary | ICD-10-CM | POA: Insufficient documentation

## 2020-09-19 DIAGNOSIS — C482 Malignant neoplasm of peritoneum, unspecified: Secondary | ICD-10-CM | POA: Insufficient documentation

## 2020-09-19 DIAGNOSIS — J449 Chronic obstructive pulmonary disease, unspecified: Secondary | ICD-10-CM | POA: Insufficient documentation

## 2020-09-19 DIAGNOSIS — Z87891 Personal history of nicotine dependence: Secondary | ICD-10-CM | POA: Diagnosis not present

## 2020-09-19 DIAGNOSIS — C786 Secondary malignant neoplasm of retroperitoneum and peritoneum: Secondary | ICD-10-CM

## 2020-09-19 DIAGNOSIS — J45909 Unspecified asthma, uncomplicated: Secondary | ICD-10-CM | POA: Diagnosis not present

## 2020-09-19 DIAGNOSIS — Z79899 Other long term (current) drug therapy: Secondary | ICD-10-CM | POA: Diagnosis not present

## 2020-09-19 DIAGNOSIS — C679 Malignant neoplasm of bladder, unspecified: Secondary | ICD-10-CM

## 2020-09-19 DIAGNOSIS — R1031 Right lower quadrant pain: Secondary | ICD-10-CM | POA: Diagnosis present

## 2020-09-19 LAB — COMPREHENSIVE METABOLIC PANEL
ALT: 33 U/L (ref 0–44)
AST: 43 U/L — ABNORMAL HIGH (ref 15–41)
Albumin: 3.4 g/dL — ABNORMAL LOW (ref 3.5–5.0)
Alkaline Phosphatase: 161 U/L — ABNORMAL HIGH (ref 38–126)
Anion gap: 9 (ref 5–15)
BUN: 23 mg/dL (ref 8–23)
CO2: 28 mmol/L (ref 22–32)
Calcium: 9.1 mg/dL (ref 8.9–10.3)
Chloride: 101 mmol/L (ref 98–111)
Creatinine, Ser: 0.92 mg/dL (ref 0.44–1.00)
GFR, Estimated: 58 mL/min — ABNORMAL LOW (ref >60)
Glucose, Bld: 95 mg/dL (ref 70–99)
Potassium: 3.5 mmol/L (ref 3.5–5.1)
Sodium: 138 mmol/L (ref 135–145)
Total Bilirubin: 0.6 mg/dL (ref 0.3–1.2)
Total Protein: 7 g/dL (ref 6.5–8.1)

## 2020-09-19 LAB — URINALYSIS, ROUTINE W REFLEX MICROSCOPIC
Bilirubin Urine: NEGATIVE
Glucose, UA: NEGATIVE mg/dL
Hgb urine dipstick: NEGATIVE
Ketones, ur: NEGATIVE mg/dL
Nitrite: NEGATIVE
Protein, ur: NEGATIVE mg/dL
Specific Gravity, Urine: 1.01 (ref 1.005–1.030)
pH: 6.5 (ref 5.0–8.0)

## 2020-09-19 LAB — URINALYSIS, MICROSCOPIC (REFLEX)

## 2020-09-19 LAB — CBC
HCT: 41.2 % (ref 36.0–46.0)
Hemoglobin: 13.4 g/dL (ref 12.0–15.0)
MCH: 28.2 pg (ref 26.0–34.0)
MCHC: 32.5 g/dL (ref 30.0–36.0)
MCV: 86.7 fL (ref 80.0–100.0)
Platelets: 365 10*3/uL (ref 150–400)
RBC: 4.75 MIL/uL (ref 3.87–5.11)
RDW: 15.2 % (ref 11.5–15.5)
WBC: 9.3 10*3/uL (ref 4.0–10.5)
nRBC: 0 % (ref 0.0–0.2)

## 2020-09-19 LAB — LIPASE, BLOOD: Lipase: 31 U/L (ref 11–51)

## 2020-09-19 MED ORDER — OXYCODONE HCL 5 MG PO TABS: 2.5000 mg | ORAL_TABLET | Freq: Every evening | ORAL | 0 refills | Status: AC | PRN

## 2020-09-19 MED ORDER — IOHEXOL 350 MG/ML SOLN
100.0000 mL | Freq: Once | INTRAVENOUS | Status: AC | PRN
  Administered 2020-09-19: 85 mL via INTRAVENOUS

## 2020-09-19 NOTE — Telephone Encounter (Signed)
CT scan personally reviewed and discussed with daughter via phone today.  Her disease appears to be progressing rapidly and her prognosis is poor with limited life expectancy of less than 6 months.  I agree with palliative medicine services likely will require hospice in the near future.  All her questions are answered to her satisfaction.

## 2020-09-19 NOTE — Telephone Encounter (Signed)
Patient's daughter called. Patient was seen in ED today for abd pain.. CT scan done. Her cancer has spread to her abdomen. The ED suggested she call Dr. Hazeline Junker office for an appointment as soon as possible to discuss palliative care.  Call message routed to Dr. Alen Blew NOTE: palliative care referral placed by Dr. Joya Gaskins in ED.

## 2020-09-19 NOTE — ED Provider Notes (Signed)
[facility] EMERGENCY DEPARTMENT Provider Note   CSN: [id number] Arrival date & time: 09/19/20  [id number]     History Chief Complaint  Patient presents with   Abdominal Pain    Crystal Kerr is a 87 y.o. female.  The history is provided by the patient.  Abdominal Pain Pain location:  RLQ Pain quality: sharp   Pain radiates to:  Does not radiate Pain severity:  Moderate Onset quality:  Sudden Duration:  2 days Timing:  Constant Progression:  Waxing and waning Chronicity:  New Context comment:  Has bladder cancer. Relieved by:  OTC medications (tylenol helps the pain) Worsened by:  Nothing Associated symptoms: anorexia and constipation   Associated symptoms: no chest pain, no chills, no cough, no diarrhea, no dysuria, no fever, no hematuria, no nausea, no shortness of breath, no sore throat and no vomiting       Past Medical History:  Diagnosis Date   Arthritis    Asthma    Atrial fibrillation ([place])    a. s/p DCCV in 2015, on Amiodarone and Eliquis   CHF (congestive heart failure) (HCC)    COPD (chronic obstructive pulmonary disease) ([place])    Dysrhythmia    afib -hx    Hypoglycemia    Neuromuscular disorder ([place])    peripheral neuropathy    Peripheral neuropathy    hands and feet   Pneumonia    hx of x 2    PVC's (premature ventricular contractions)    Unstable gait     Patient Active Problem List   Diagnosis Date Noted   Hypertension 08/15/2019   Secondary hypercoagulable state ([place]) 12/20/2018   Bladder cancer ([place]) 07/26/2018   Renal insufficiency 02/10/2018   Chronic diastolic heart failure ([place]) 11/23/2017   ILD (interstitial lung disease) ([place]) 11/23/2017   Current use of long term anticoagulation 07/31/2016   Atypical pneumonia 06/02/2016   History of atrial fibrillation 06/02/2016   Lower extremity edema 12/19/2013   Deafness or hearing loss of type classifiable to 389.0 with type classifiable to 389.1 10/27/2013   Benign  paroxysmal positional vertigo 09/13/2013   Upper airway cough syndrome 07/10/2013   Allergic rhinitis 07/05/2013   Persistent atrial fibrillation ([place]) 03/30/2013   Shortness of breath 02/06/2013   Acute on chronic diastolic congestive heart failure ([place]) 02/04/2013   Arthritis of spine 07/01/2012   Intrinsic asthma 07/01/2012   Dyspnea 04/12/2012   PVC's (premature ventricular contractions) 04/24/2011   Murmur, cardiac 04/24/2011    Past Surgical History:  Procedure Laterality Date   APPENDECTOMY     CARDIOVERSION N/A 03/08/2013   Procedure: CARDIOVERSION;  Surgeon: Larey Dresser, MD;  Location: [facility];  Service: Cardiovascular;  Laterality: N/A;   CARDIOVERSION N/A 07/05/2019   Procedure: CARDIOVERSION;  Surgeon: Sueanne Margarita, MD;  Location[facility];  Service: Cardiovascular;  Laterality: N/A;   CHOLECYSTECTOMY     oophrectomy     TEE WITHOUT CARDIOVERSION N/A 03/08/2013   Procedure: TRANSESOPHAGEAL ECHOCARDIOGRAM (TEE);  Surgeon: Larey Dresser, MD;  Location: [facility];  Service: Cardiovascular;  Laterality: N/A;   TONSILLECTOMY     TRANSURETHRAL RESECTION OF BLADDER TUMOR WITH GYRUS (TURBT-GYRUS)  05/13/2016   urology wiht [facility]   TRANSURETHRAL RESECTION OF BLADDER TUMOR WITH MITOMYCIN-C N/A 12/24/2017   Procedure: TRANSURETHRAL RESECTION OF BLADDER TUMOR;  Surgeon: Lucas Mallow, MD;  Location: [facility];  Service: Urology;  Laterality: N/A;   TRANSURETHRAL RESECTION OF BLADDER TUMOR WITH MITOMYCIN-C Bilateral 08/26/2020  Procedure: TRANSURETHRAL RESECTION OF BLADDER TUMOR WITH GEMCITABINE BILATERAL RETROGRADE PYELOGRAM;  Surgeon: Lucas Mallow, MD;  Location: [facility];  Service: Urology;  Laterality: Bilateral;     OB History   No obstetric history on file.     Family History  Problem Relation Age of Onset   Heart failure Mother    Diabetes Father    Heart disease Other        No family history    Social History   Tobacco Use   Smoking  status: Former    Packs/day: 1.00    Years: 20.00    Pack years: 20.00    Types: Cigarettes    Quit date: 04/13/1981    Years since quitting: 39.4   Smokeless tobacco: Never  Vaping Use   Vaping Use: Never used  Substance Use Topics   Alcohol use: Not Currently    Comment: Occasional   Drug use: No    Home Medications Prior to Admission medications   Medication Sig Start Date End Date Taking? Authorizing Provider  acetaminophen (TYLENOL) 325 MG tablet Take 2 tablets (650 mg total) by mouth every 6 (six) hours as needed for mild pain or headache. 06/05/16   Raiford Noble Latif, DO  albuterol [facility] HFA) 108 (90 Base) MCG/ACT inhaler Inhale 2 puffs into the lungs every 4 (four) hours as needed for wheezing or shortness of breath. 02/15/18   Reyne Dumas, MD  chlorpheniramine (CHLOR-TRIMETON) 4 MG tablet Take 4 mg by mouth 2 (two) times daily as needed for allergies.    [provider]  diclofenac Sodium (VOLTAREN) 1 % GEL Apply 1 application topically 2 (two) times daily as needed (pain).    [provider]  diltiazem (TIAZAC) 120 MG 24 hr capsule TAKE 1 CAPSULE BY MOUTH DAILY 04/30/20   Lelon Perla, MD  ELIQUIS 5 MG TABS tablet TAKE 1 TABLET(5 MG) BY MOUTH TWICE DAILY 09/09/20   Lelon Perla, MD  fluticasone furoate-vilanterol (BREO ELLIPTA) 200-25 MCG/INH AEPB Inhale 1 puff into the lungs daily. NEEDS OFFICE VISIT 06/14/20   Chesley Mires, MD  furosemide (LASIX) 20 MG tablet Take 1 tablet (20 mg total) by mouth daily. Use as needed for swelling 06/28/19   Copland, Gay Filler, MD  mirabegron ER (MYRBETRIQ) 50 MG TB24 tablet Take 1 tablet (50 mg total) by mouth daily. 04/22/20   Copland, Gay Filler, MD  montelukast (SINGULAIR) 10 MG tablet Take 1 tablet (10 mg total) by mouth daily. 06/14/20   Chesley Mires, MD  Multiple Vitamins-Minerals (MULTIVITAMIN WITH MINERALS) tablet Take 1 tablet by mouth daily.    [provider]  traMADol (ULTRAM) 50 MG tablet Take 1  tablet (50 mg total) by mouth every 12 (twelve) hours as needed (pain). 08/26/20   Lucas Mallow, MD    Allergies    Patient has no known allergies.  Review of Systems   Review of Systems  Constitutional:  Negative for chills and fever.  HENT:  Negative for ear pain and sore throat.   Eyes:  Negative for pain and visual disturbance.  Respiratory:  Negative for cough and shortness of breath.   Cardiovascular:  Negative for chest pain and palpitations.  Gastrointestinal:  Positive for abdominal pain, anorexia and constipation. Negative for diarrhea, nausea and vomiting.  Genitourinary:  Positive for frequency. Negative for dysuria and hematuria.  Musculoskeletal:  Negative for arthralgias and back pain.  Skin:  Negative for color change and rash.  Neurological:  Negative for seizures and syncope.  All other systems reviewed and are negative.  Physical Exam Updated Vital Signs BP (!) 156/76 (BP Location: Right Arm)   Pulse 96   Temp 98.5 F (36.9 C) (Oral)   Resp (!) 26   Ht '5\' 3"'$  (1.6 m)   Wt 68.9 kg   SpO2 95%   BMI 26.93 kg/m   Physical Exam Vitals and nursing note reviewed.  Constitutional:      Appearance: She is well-developed.  HENT:     Head: Normocephalic and atraumatic.  Cardiovascular:     Rate and Rhythm: Normal rate and regular rhythm.     Heart sounds: Normal heart sounds.  Pulmonary:     Effort: Pulmonary effort is normal. No tachypnea.     Breath sounds: Normal breath sounds.  Abdominal:     General: There is distension.     Palpations: Abdomen is soft.     Tenderness: There is abdominal tenderness. There is guarding and rebound.     Comments: Increased in RUQ  Musculoskeletal:     Right lower leg: No edema.     Left lower leg: No edema.  Skin:    General: Skin is warm and dry.  Neurological:     General: No focal deficit present.     Mental Status: She is alert and oriented to person, place, and time.  Psychiatric:        Mood and Affect:  Mood normal.        Behavior: Behavior normal.    ED Results / Procedures / Treatments   Labs (all labs ordered are listed, but only abnormal results are displayed) Labs Reviewed  COMPREHENSIVE METABOLIC PANEL - Abnormal; Notable for the following components:      Result Value   Albumin 3.4 (*)    AST 43 (*)    Alkaline Phosphatase 161 (*)    GFR, Estimated 58 (*)    All other components within normal limits  URINALYSIS, ROUTINE W REFLEX MICROSCOPIC - Abnormal; Notable for the following components:   Leukocytes,Ua MODERATE (*)    All other components within normal limits  URINALYSIS, MICROSCOPIC (REFLEX) - Abnormal; Notable for the following components:   Bacteria, UA FEW (*)    All other components within normal limits  LIPASE, BLOOD  CBC    EKG None  Radiology CT Abdomen Pelvis W Contrast  Result Date: 09/19/2020 CLINICAL DATA:  Abdominal pain for 2 days. History of bladder cancer. EXAM: CT ABDOMEN AND PELVIS WITH CONTRAST TECHNIQUE: Multidetector CT imaging of the abdomen and pelvis was performed using the standard protocol following bolus administration of intravenous contrast. CONTRAST:  87m OMNIPAQUE IOHEXOL 350 MG/ML SOLN COMPARISON:  CT scan 12/19/2018 FINDINGS: Lower chest: The lung bases are clear of an acute process. No pleural effusions or pulmonary lesions. The heart is mildly enlarged. No pericardial effusion. Stable aortic and coronary artery calcifications. Hepatobiliary: Bizarre enhancement pattern in the liver but no findings for portal or hepatic vein thrombosis. This is likely geographic fatty infiltration. Progressive intra and extrahepatic biliary dilatation is noted. The gallbladder is surgically absent. Pancreas: No mass, inflammation or ductal dilatation. Spleen: Normal size. No focal lesions. Adrenals/Urinary Tract: New right adrenal gland lesion measures 15 mm and suspicious for metastasis. The left adrenal gland is unremarkable. Bilateral renal cysts are  noted. No worrisome renal lesions. The delayed images do not demonstrate any significant collecting system abnormalities. No ureteral dilatation or calculi. Enhancing tumor is noted along the anterior wall  the bladder. There appears to be tumor extending through the serosa and into the perivesical fat (image 61/2). Stomach/Bowel: The stomach, duodenum, small bowel and colon are grossly normal. No acute inflammatory changes, mass lesions or obstructive findings. Vascular/Lymphatic: Stable advanced atherosclerotic calcifications involving the aorta, iliac arteries and branch vessels but no aneurysm or dissection. No retroperitoneal lymphadenopathy. However, there is diffuse peritoneal carcinomatosis with innumerable soft tissue lesions throughout the abdomen and pelvis. There is tumor surrounding the celiac axis which measures approximately 3.3 x 2.5 cm. Reproductive: Possible endometrial thickening. I am not sure if this is poor enhancement of the myometrium or a markedly thickened endometrium measuring 16 mm. Pelvic ultrasound may be helpful for further evaluation. Other: Small amount free pelvic fluid is noted mainly in the cul-de-sac and likely related to the extensive peritoneal disease. Musculoskeletal: No significant bony findings. IMPRESSION: 1. Diffuse peritoneal carcinomatosis with innumerable soft tissue lesions throughout the abdomen and pelvis. This is most likely metastatic disease from the patient's bladder cancer but the endometrium appears thickened and I suppose this could be endometrial cancer. Recommend pelvic ultrasound examination. 2. Enhancing bladder tumor and possible extravesical spread. 3. New right adrenal gland lesion suspicious for metastasis. 4. Progressive intra and extrahepatic biliary dilatation. 5. Bizarre enhancement pattern in the liver but no findings for portal or hepatic vein thrombosis. This is likely geographic fatty infiltration. 6. Stable advanced atherosclerotic  calcifications involving the aorta and branch vessels. 7. Aortic atherosclerosis. Aortic Atherosclerosis (ICD10-I70.0). Electronically Signed   By: Marijo Sanes M.D.   On: 09/19/2020 11:50    Procedures Procedures   Medications Ordered in ED Medications - No data to display  ED Course  I have reviewed the triage vital signs and the nursing notes.  Pertinent labs & imaging results that were available during my care of the patient were reviewed by me and considered in my medical decision making (see chart for details).    MDM Rules/Calculators/A&P                           ALESCIA ROWBOTHAM presents to the ED with right lower quadrant abdominal pain.  She has a history of bladder cancer and was actually scheduled for CT scan for staging and further treatment recommendations today.  Pain was so severe that she was unable to make that appointment and came to the ED.  Vital signs are normal with the exception of hypertension.  Abdominal exam revealed tenderness especially in the right lower quadrant.  Work-up showed severe progression of her cancer.  She will follow-up with oncology.  She is interested in palliative care, and a consult was placed.  I also placed a consult for transition of care to help her with any home health or equipment needs.  Finally, she requested pain medication for nighttime use.  She was prescribed oxycodone 2.5 mg at bedtime, and I told her she could take 5 mg.  She understands this can constipate her, and she will pay close attention to her bowel regimen.  Return precautions were discussed. Final Clinical Impression(s) / ED Diagnoses Final diagnoses:  Peritoneal carcinomatosis ([place])  Malignant neoplasm of urinary bladder, unspecified site [facility])    Rx / DC Orders ED Discharge Orders          Ordered    Amb Referral to Palliative Care        09/19/20 1321    Consult to Transition of Care Team  Comments: Worsened metastatic cancer; eval home care needs   Provider:  (Not yet assigned)   09/19/20 1323    oxyCODONE (ROXICODONE) 5 MG immediate release tablet  At bedtime PRN        09/19/20 1323             Arnaldo Natal, MD 09/19/20 1327

## 2020-09-19 NOTE — ED Triage Notes (Addendum)
Pt c/o abdominal pain, worse at night x 2 days. Denies N/V/D. Constipation, last BM 2 days ago, taking aloe. States hx of bladder cancer

## 2020-09-23 ENCOUNTER — Telehealth: Payer: Self-pay

## 2020-09-23 ENCOUNTER — Other Ambulatory Visit (HOSPITAL_BASED_OUTPATIENT_CLINIC_OR_DEPARTMENT_OTHER): Payer: Self-pay

## 2020-09-23 NOTE — Telephone Encounter (Signed)
Spoke with patient's daughter [name] regarding scheduling a Palliative Care consult.. She states that this needs to be a Hospice referral. Patient was recently diagnosed with bladder cancer and has progressively worsen over the past couple days. Daughter states oncologist advised that prognosis is 6 weeks or less due to the aggressive type of cancer. E-mail sent to referral specialist for Hospice [facility].

## 2020-09-24 ENCOUNTER — Ambulatory Visit: Payer: Medicare Other | Admitting: Radiation Oncology

## 2020-09-24 ENCOUNTER — Ambulatory Visit: Payer: Medicare Other

## 2020-09-25 ENCOUNTER — Telehealth: Payer: Self-pay | Admitting: Family Medicine

## 2020-09-25 NOTE — Telephone Encounter (Signed)
Ok to give verbal orders?

## 2020-09-25 NOTE — Telephone Encounter (Signed)
Crystal Kerr from [facility] called and stated pt. has transitioned into hospice. They need verbal orders that Dr.Copland can remain her Dr. While in hospice care.  Phone: [phone]

## 2020-09-25 NOTE — Telephone Encounter (Signed)
Called and gave VO 

## 2020-09-27 DIAGNOSIS — C786 Secondary malignant neoplasm of retroperitoneum and peritoneum: Secondary | ICD-10-CM | POA: Diagnosis not present

## 2020-09-27 DIAGNOSIS — Z87891 Personal history of nicotine dependence: Secondary | ICD-10-CM | POA: Diagnosis not present

## 2020-09-27 DIAGNOSIS — I110 Hypertensive heart disease with heart failure: Secondary | ICD-10-CM | POA: Diagnosis not present

## 2020-09-27 DIAGNOSIS — J45909 Unspecified asthma, uncomplicated: Secondary | ICD-10-CM | POA: Diagnosis not present

## 2020-09-27 DIAGNOSIS — C679 Malignant neoplasm of bladder, unspecified: Secondary | ICD-10-CM | POA: Diagnosis not present

## 2020-09-27 DIAGNOSIS — G629 Polyneuropathy, unspecified: Secondary | ICD-10-CM | POA: Diagnosis not present

## 2020-09-27 DIAGNOSIS — G2581 Restless legs syndrome: Secondary | ICD-10-CM | POA: Diagnosis not present

## 2020-09-27 DIAGNOSIS — I4891 Unspecified atrial fibrillation: Secondary | ICD-10-CM | POA: Diagnosis not present

## 2020-09-27 DIAGNOSIS — J302 Other seasonal allergic rhinitis: Secondary | ICD-10-CM | POA: Diagnosis not present

## 2020-09-27 DIAGNOSIS — J449 Chronic obstructive pulmonary disease, unspecified: Secondary | ICD-10-CM | POA: Diagnosis not present

## 2020-09-27 DIAGNOSIS — I509 Heart failure, unspecified: Secondary | ICD-10-CM | POA: Diagnosis not present

## 2020-09-30 DIAGNOSIS — I509 Heart failure, unspecified: Secondary | ICD-10-CM | POA: Diagnosis not present

## 2020-09-30 DIAGNOSIS — J449 Chronic obstructive pulmonary disease, unspecified: Secondary | ICD-10-CM | POA: Diagnosis not present

## 2020-09-30 DIAGNOSIS — I4891 Unspecified atrial fibrillation: Secondary | ICD-10-CM | POA: Diagnosis not present

## 2020-09-30 DIAGNOSIS — C786 Secondary malignant neoplasm of retroperitoneum and peritoneum: Secondary | ICD-10-CM | POA: Diagnosis not present

## 2020-09-30 DIAGNOSIS — C679 Malignant neoplasm of bladder, unspecified: Secondary | ICD-10-CM | POA: Diagnosis not present

## 2020-09-30 DIAGNOSIS — I110 Hypertensive heart disease with heart failure: Secondary | ICD-10-CM | POA: Diagnosis not present

## 2020-10-07 DIAGNOSIS — I110 Hypertensive heart disease with heart failure: Secondary | ICD-10-CM | POA: Diagnosis not present

## 2020-10-07 DIAGNOSIS — J449 Chronic obstructive pulmonary disease, unspecified: Secondary | ICD-10-CM | POA: Diagnosis not present

## 2020-10-07 DIAGNOSIS — I4891 Unspecified atrial fibrillation: Secondary | ICD-10-CM | POA: Diagnosis not present

## 2020-10-07 DIAGNOSIS — C679 Malignant neoplasm of bladder, unspecified: Secondary | ICD-10-CM | POA: Diagnosis not present

## 2020-10-07 DIAGNOSIS — I509 Heart failure, unspecified: Secondary | ICD-10-CM | POA: Diagnosis not present

## 2020-10-07 DIAGNOSIS — C786 Secondary malignant neoplasm of retroperitoneum and peritoneum: Secondary | ICD-10-CM | POA: Diagnosis not present

## 2020-10-09 DIAGNOSIS — C679 Malignant neoplasm of bladder, unspecified: Secondary | ICD-10-CM | POA: Diagnosis not present

## 2020-10-09 DIAGNOSIS — I4891 Unspecified atrial fibrillation: Secondary | ICD-10-CM | POA: Diagnosis not present

## 2020-10-09 DIAGNOSIS — C786 Secondary malignant neoplasm of retroperitoneum and peritoneum: Secondary | ICD-10-CM | POA: Diagnosis not present

## 2020-10-09 DIAGNOSIS — I110 Hypertensive heart disease with heart failure: Secondary | ICD-10-CM | POA: Diagnosis not present

## 2020-10-09 DIAGNOSIS — I509 Heart failure, unspecified: Secondary | ICD-10-CM | POA: Diagnosis not present

## 2020-10-09 DIAGNOSIS — J449 Chronic obstructive pulmonary disease, unspecified: Secondary | ICD-10-CM | POA: Diagnosis not present

## 2020-10-10 DIAGNOSIS — C786 Secondary malignant neoplasm of retroperitoneum and peritoneum: Secondary | ICD-10-CM | POA: Diagnosis not present

## 2020-10-10 DIAGNOSIS — C679 Malignant neoplasm of bladder, unspecified: Secondary | ICD-10-CM | POA: Diagnosis not present

## 2020-10-10 DIAGNOSIS — I110 Hypertensive heart disease with heart failure: Secondary | ICD-10-CM | POA: Diagnosis not present

## 2020-10-10 DIAGNOSIS — I4891 Unspecified atrial fibrillation: Secondary | ICD-10-CM | POA: Diagnosis not present

## 2020-10-10 DIAGNOSIS — J449 Chronic obstructive pulmonary disease, unspecified: Secondary | ICD-10-CM | POA: Diagnosis not present

## 2020-10-10 DIAGNOSIS — I509 Heart failure, unspecified: Secondary | ICD-10-CM | POA: Diagnosis not present

## 2020-10-11 DIAGNOSIS — C786 Secondary malignant neoplasm of retroperitoneum and peritoneum: Secondary | ICD-10-CM | POA: Diagnosis not present

## 2020-10-11 DIAGNOSIS — I4891 Unspecified atrial fibrillation: Secondary | ICD-10-CM | POA: Diagnosis not present

## 2020-10-11 DIAGNOSIS — I509 Heart failure, unspecified: Secondary | ICD-10-CM | POA: Diagnosis not present

## 2020-10-11 DIAGNOSIS — C679 Malignant neoplasm of bladder, unspecified: Secondary | ICD-10-CM | POA: Diagnosis not present

## 2020-10-11 DIAGNOSIS — J449 Chronic obstructive pulmonary disease, unspecified: Secondary | ICD-10-CM | POA: Diagnosis not present

## 2020-10-11 DIAGNOSIS — I110 Hypertensive heart disease with heart failure: Secondary | ICD-10-CM | POA: Diagnosis not present

## 2020-10-12 DIAGNOSIS — I509 Heart failure, unspecified: Secondary | ICD-10-CM | POA: Diagnosis not present

## 2020-10-12 DIAGNOSIS — J45909 Unspecified asthma, uncomplicated: Secondary | ICD-10-CM | POA: Diagnosis not present

## 2020-10-12 DIAGNOSIS — G2581 Restless legs syndrome: Secondary | ICD-10-CM | POA: Diagnosis not present

## 2020-10-12 DIAGNOSIS — C786 Secondary malignant neoplasm of retroperitoneum and peritoneum: Secondary | ICD-10-CM | POA: Diagnosis not present

## 2020-10-12 DIAGNOSIS — I4891 Unspecified atrial fibrillation: Secondary | ICD-10-CM | POA: Diagnosis not present

## 2020-10-12 DIAGNOSIS — G629 Polyneuropathy, unspecified: Secondary | ICD-10-CM | POA: Diagnosis not present

## 2020-10-12 DIAGNOSIS — J302 Other seasonal allergic rhinitis: Secondary | ICD-10-CM | POA: Diagnosis not present

## 2020-10-12 DIAGNOSIS — I110 Hypertensive heart disease with heart failure: Secondary | ICD-10-CM | POA: Diagnosis not present

## 2020-10-12 DIAGNOSIS — Z87891 Personal history of nicotine dependence: Secondary | ICD-10-CM | POA: Diagnosis not present

## 2020-10-12 DIAGNOSIS — C679 Malignant neoplasm of bladder, unspecified: Secondary | ICD-10-CM | POA: Diagnosis not present

## 2020-10-12 DIAGNOSIS — J449 Chronic obstructive pulmonary disease, unspecified: Secondary | ICD-10-CM | POA: Diagnosis not present

## 2020-10-16 DIAGNOSIS — I509 Heart failure, unspecified: Secondary | ICD-10-CM | POA: Diagnosis not present

## 2020-10-16 DIAGNOSIS — I110 Hypertensive heart disease with heart failure: Secondary | ICD-10-CM | POA: Diagnosis not present

## 2020-10-16 DIAGNOSIS — I4891 Unspecified atrial fibrillation: Secondary | ICD-10-CM | POA: Diagnosis not present

## 2020-10-16 DIAGNOSIS — J449 Chronic obstructive pulmonary disease, unspecified: Secondary | ICD-10-CM | POA: Diagnosis not present

## 2020-10-16 DIAGNOSIS — C786 Secondary malignant neoplasm of retroperitoneum and peritoneum: Secondary | ICD-10-CM | POA: Diagnosis not present

## 2020-10-16 DIAGNOSIS — C679 Malignant neoplasm of bladder, unspecified: Secondary | ICD-10-CM | POA: Diagnosis not present

## 2020-10-18 DIAGNOSIS — C679 Malignant neoplasm of bladder, unspecified: Secondary | ICD-10-CM | POA: Diagnosis not present

## 2020-10-18 DIAGNOSIS — I509 Heart failure, unspecified: Secondary | ICD-10-CM | POA: Diagnosis not present

## 2020-10-18 DIAGNOSIS — I110 Hypertensive heart disease with heart failure: Secondary | ICD-10-CM | POA: Diagnosis not present

## 2020-10-18 DIAGNOSIS — J449 Chronic obstructive pulmonary disease, unspecified: Secondary | ICD-10-CM | POA: Diagnosis not present

## 2020-10-18 DIAGNOSIS — C786 Secondary malignant neoplasm of retroperitoneum and peritoneum: Secondary | ICD-10-CM | POA: Diagnosis not present

## 2020-10-18 DIAGNOSIS — I4891 Unspecified atrial fibrillation: Secondary | ICD-10-CM | POA: Diagnosis not present

## 2020-10-19 DIAGNOSIS — C679 Malignant neoplasm of bladder, unspecified: Secondary | ICD-10-CM | POA: Diagnosis not present

## 2020-10-19 DIAGNOSIS — J449 Chronic obstructive pulmonary disease, unspecified: Secondary | ICD-10-CM | POA: Diagnosis not present

## 2020-10-19 DIAGNOSIS — I509 Heart failure, unspecified: Secondary | ICD-10-CM | POA: Diagnosis not present

## 2020-10-19 DIAGNOSIS — I110 Hypertensive heart disease with heart failure: Secondary | ICD-10-CM | POA: Diagnosis not present

## 2020-10-19 DIAGNOSIS — C786 Secondary malignant neoplasm of retroperitoneum and peritoneum: Secondary | ICD-10-CM | POA: Diagnosis not present

## 2020-10-19 DIAGNOSIS — I4891 Unspecified atrial fibrillation: Secondary | ICD-10-CM | POA: Diagnosis not present

## 2020-10-21 DIAGNOSIS — I110 Hypertensive heart disease with heart failure: Secondary | ICD-10-CM | POA: Diagnosis not present

## 2020-10-21 DIAGNOSIS — I4891 Unspecified atrial fibrillation: Secondary | ICD-10-CM | POA: Diagnosis not present

## 2020-10-21 DIAGNOSIS — I509 Heart failure, unspecified: Secondary | ICD-10-CM | POA: Diagnosis not present

## 2020-10-21 DIAGNOSIS — J449 Chronic obstructive pulmonary disease, unspecified: Secondary | ICD-10-CM | POA: Diagnosis not present

## 2020-10-21 DIAGNOSIS — C786 Secondary malignant neoplasm of retroperitoneum and peritoneum: Secondary | ICD-10-CM | POA: Diagnosis not present

## 2020-10-21 DIAGNOSIS — C679 Malignant neoplasm of bladder, unspecified: Secondary | ICD-10-CM | POA: Diagnosis not present

## 2020-10-22 DIAGNOSIS — I110 Hypertensive heart disease with heart failure: Secondary | ICD-10-CM | POA: Diagnosis not present

## 2020-10-22 DIAGNOSIS — C679 Malignant neoplasm of bladder, unspecified: Secondary | ICD-10-CM | POA: Diagnosis not present

## 2020-10-22 DIAGNOSIS — J449 Chronic obstructive pulmonary disease, unspecified: Secondary | ICD-10-CM | POA: Diagnosis not present

## 2020-10-22 DIAGNOSIS — I509 Heart failure, unspecified: Secondary | ICD-10-CM | POA: Diagnosis not present

## 2020-10-22 DIAGNOSIS — C786 Secondary malignant neoplasm of retroperitoneum and peritoneum: Secondary | ICD-10-CM | POA: Diagnosis not present

## 2020-10-22 DIAGNOSIS — I4891 Unspecified atrial fibrillation: Secondary | ICD-10-CM | POA: Diagnosis not present

## 2020-10-23 DIAGNOSIS — I110 Hypertensive heart disease with heart failure: Secondary | ICD-10-CM | POA: Diagnosis not present

## 2020-10-23 DIAGNOSIS — C786 Secondary malignant neoplasm of retroperitoneum and peritoneum: Secondary | ICD-10-CM | POA: Diagnosis not present

## 2020-10-23 DIAGNOSIS — I509 Heart failure, unspecified: Secondary | ICD-10-CM | POA: Diagnosis not present

## 2020-10-23 DIAGNOSIS — I4891 Unspecified atrial fibrillation: Secondary | ICD-10-CM | POA: Diagnosis not present

## 2020-10-23 DIAGNOSIS — J449 Chronic obstructive pulmonary disease, unspecified: Secondary | ICD-10-CM | POA: Diagnosis not present

## 2020-10-23 DIAGNOSIS — C679 Malignant neoplasm of bladder, unspecified: Secondary | ICD-10-CM | POA: Diagnosis not present

## 2020-10-24 DIAGNOSIS — I509 Heart failure, unspecified: Secondary | ICD-10-CM | POA: Diagnosis not present

## 2020-10-24 DIAGNOSIS — J449 Chronic obstructive pulmonary disease, unspecified: Secondary | ICD-10-CM | POA: Diagnosis not present

## 2020-10-24 DIAGNOSIS — I110 Hypertensive heart disease with heart failure: Secondary | ICD-10-CM | POA: Diagnosis not present

## 2020-10-24 DIAGNOSIS — C786 Secondary malignant neoplasm of retroperitoneum and peritoneum: Secondary | ICD-10-CM | POA: Diagnosis not present

## 2020-10-24 DIAGNOSIS — C679 Malignant neoplasm of bladder, unspecified: Secondary | ICD-10-CM | POA: Diagnosis not present

## 2020-10-24 DIAGNOSIS — I4891 Unspecified atrial fibrillation: Secondary | ICD-10-CM | POA: Diagnosis not present

## 2020-10-25 DIAGNOSIS — C679 Malignant neoplasm of bladder, unspecified: Secondary | ICD-10-CM | POA: Diagnosis not present

## 2020-10-25 DIAGNOSIS — C786 Secondary malignant neoplasm of retroperitoneum and peritoneum: Secondary | ICD-10-CM | POA: Diagnosis not present

## 2020-10-25 DIAGNOSIS — J449 Chronic obstructive pulmonary disease, unspecified: Secondary | ICD-10-CM | POA: Diagnosis not present

## 2020-10-25 DIAGNOSIS — I110 Hypertensive heart disease with heart failure: Secondary | ICD-10-CM | POA: Diagnosis not present

## 2020-10-25 DIAGNOSIS — I4891 Unspecified atrial fibrillation: Secondary | ICD-10-CM | POA: Diagnosis not present

## 2020-10-25 DIAGNOSIS — I509 Heart failure, unspecified: Secondary | ICD-10-CM | POA: Diagnosis not present

## 2020-10-26 DIAGNOSIS — I110 Hypertensive heart disease with heart failure: Secondary | ICD-10-CM | POA: Diagnosis not present

## 2020-10-26 DIAGNOSIS — C786 Secondary malignant neoplasm of retroperitoneum and peritoneum: Secondary | ICD-10-CM | POA: Diagnosis not present

## 2020-10-26 DIAGNOSIS — C679 Malignant neoplasm of bladder, unspecified: Secondary | ICD-10-CM | POA: Diagnosis not present

## 2020-10-26 DIAGNOSIS — J449 Chronic obstructive pulmonary disease, unspecified: Secondary | ICD-10-CM | POA: Diagnosis not present

## 2020-10-26 DIAGNOSIS — I509 Heart failure, unspecified: Secondary | ICD-10-CM | POA: Diagnosis not present

## 2020-10-26 DIAGNOSIS — I4891 Unspecified atrial fibrillation: Secondary | ICD-10-CM | POA: Diagnosis not present

## 2020-10-28 DIAGNOSIS — I4891 Unspecified atrial fibrillation: Secondary | ICD-10-CM | POA: Diagnosis not present

## 2020-10-28 DIAGNOSIS — C679 Malignant neoplasm of bladder, unspecified: Secondary | ICD-10-CM | POA: Diagnosis not present

## 2020-10-28 DIAGNOSIS — I110 Hypertensive heart disease with heart failure: Secondary | ICD-10-CM | POA: Diagnosis not present

## 2020-10-28 DIAGNOSIS — J449 Chronic obstructive pulmonary disease, unspecified: Secondary | ICD-10-CM | POA: Diagnosis not present

## 2020-10-28 DIAGNOSIS — C786 Secondary malignant neoplasm of retroperitoneum and peritoneum: Secondary | ICD-10-CM | POA: Diagnosis not present

## 2020-10-28 DIAGNOSIS — I509 Heart failure, unspecified: Secondary | ICD-10-CM | POA: Diagnosis not present

## 2020-10-29 ENCOUNTER — Telehealth: Payer: Self-pay | Admitting: Family Medicine

## 2020-10-29 NOTE — Telephone Encounter (Signed)
Pt. Daughter called and stated that Crystal Kerr has passed away and death certificate has been sent to be signed.

## 2020-10-29 NOTE — Telephone Encounter (Signed)
FYI

## 2020-10-29 NOTE — Telephone Encounter (Signed)
Pt died early Monday am 10/17-she was alive at 1:15 am and pronounced at 8:45. She is at [place], await cremation

## 2020-10-30 NOTE — Telephone Encounter (Signed)
Certified death certificate, called [name] and let her know

## 2020-11-12 DEATH — deceased

## 2020-12-25 ENCOUNTER — Ambulatory Visit: Payer: Medicare Other | Admitting: Cardiology

## 2021-01-19 ENCOUNTER — Other Ambulatory Visit: Payer: Self-pay | Admitting: Family Medicine
